# Patient Record
Sex: Male | Born: 1990 | Race: White | Hispanic: Yes | Marital: Married | Smoking: Current every day smoker
Health system: Southern US, Community
[De-identification: ages and names within clinical notes are randomized; demographics above are authoritative.]

## PROBLEM LIST (undated history)

## (undated) DIAGNOSIS — I10 Essential (primary) hypertension: Secondary | ICD-10-CM

## (undated) DIAGNOSIS — F1010 Alcohol abuse, uncomplicated: Secondary | ICD-10-CM

## (undated) DIAGNOSIS — F32A Depression, unspecified: Secondary | ICD-10-CM

## (undated) DIAGNOSIS — I499 Cardiac arrhythmia, unspecified: Secondary | ICD-10-CM

## (undated) DIAGNOSIS — I4891 Unspecified atrial fibrillation: Secondary | ICD-10-CM

## (undated) DIAGNOSIS — F419 Anxiety disorder, unspecified: Secondary | ICD-10-CM

## (undated) HISTORY — DX: Unspecified atrial fibrillation: I48.91

---
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
## 2010-02-26 ENCOUNTER — Emergency Department (HOSPITAL_COMMUNITY): Admission: EM | Admit: 2010-02-26 | Discharge: 2010-02-26 | Payer: Self-pay | Admitting: Emergency Medicine

## 2019-08-04 ENCOUNTER — Emergency Department (HOSPITAL_COMMUNITY)
Admission: EM | Admit: 2019-08-04 | Discharge: 2019-08-04 | Discharge status: Against Medical Advice | Payer: Self-pay | Attending: Emergency Medicine | Admitting: Emergency Medicine

## 2019-08-04 ENCOUNTER — Emergency Department (HOSPITAL_COMMUNITY): Payer: Self-pay

## 2019-08-04 ENCOUNTER — Encounter (HOSPITAL_COMMUNITY): Payer: Self-pay

## 2019-08-04 ENCOUNTER — Other Ambulatory Visit: Payer: Self-pay

## 2019-08-04 DIAGNOSIS — R600 Localized edema: Secondary | ICD-10-CM

## 2019-08-04 DIAGNOSIS — F17200 Nicotine dependence, unspecified, uncomplicated: Secondary | ICD-10-CM | POA: Insufficient documentation

## 2019-08-04 DIAGNOSIS — R945 Abnormal results of liver function studies: Secondary | ICD-10-CM | POA: Insufficient documentation

## 2019-08-04 DIAGNOSIS — R748 Abnormal levels of other serum enzymes: Secondary | ICD-10-CM

## 2019-08-04 DIAGNOSIS — R2243 Localized swelling, mass and lump, lower limb, bilateral: Secondary | ICD-10-CM | POA: Insufficient documentation

## 2019-08-04 LAB — COMPREHENSIVE METABOLIC PANEL
ALT: 191 U/L — ABNORMAL HIGH (ref 0–44)
AST: 275 U/L — ABNORMAL HIGH (ref 15–41)
Albumin: 3.9 g/dL (ref 3.5–5.0)
Alkaline Phosphatase: 113 U/L (ref 38–126)
Anion gap: 14 (ref 5–15)
BUN: <5 mg/dL — ABNORMAL LOW (ref 6–20)
CO2: 22 mmol/L (ref 22–32)
Calcium: 8.6 mg/dL — ABNORMAL LOW (ref 8.9–10.3)
Chloride: 101 mmol/L (ref 98–111)
Creatinine, Ser: 0.6 mg/dL — ABNORMAL LOW (ref 0.61–1.24)
GFR calc Af Amer: >60 mL/min (ref >60)
GFR calc non Af Amer: >60 mL/min (ref >60)
Glucose, Bld: 117 mg/dL — ABNORMAL HIGH (ref 70–99)
Potassium: 3.9 mmol/L (ref 3.5–5.1)
Sodium: 137 mmol/L (ref 135–145)
Total Bilirubin: 1.2 mg/dL (ref 0.3–1.2)
Total Protein: 7.6 g/dL (ref 6.5–8.1)

## 2019-08-04 LAB — CBC WITH DIFFERENTIAL/PLATELET
Abs Immature Granulocytes: 0.01 10*3/uL (ref 0.00–0.07)
Basophils Absolute: 0.1 10*3/uL (ref 0.0–0.1)
Basophils Relative: 2 %
Eosinophils Absolute: 0.1 10*3/uL (ref 0.0–0.5)
Eosinophils Relative: 3 %
HCT: 41.8 % (ref 39.0–52.0)
Hemoglobin: 14.6 g/dL (ref 13.0–17.0)
Immature Granulocytes: 0 %
Lymphocytes Relative: 21 %
Lymphs Abs: 1 10*3/uL (ref 0.7–4.0)
MCH: 34.4 pg — ABNORMAL HIGH (ref 26.0–34.0)
MCHC: 34.9 g/dL (ref 30.0–36.0)
MCV: 98.6 fL (ref 80.0–100.0)
Monocytes Absolute: 0.8 10*3/uL (ref 0.1–1.0)
Monocytes Relative: 18 %
Neutro Abs: 2.6 10*3/uL (ref 1.7–7.7)
Neutrophils Relative %: 56 %
Platelets: 139 10*3/uL — ABNORMAL LOW (ref 150–400)
RBC: 4.24 MIL/uL (ref 4.22–5.81)
RDW: 14.4 % (ref 11.5–15.5)
WBC: 4.7 10*3/uL (ref 4.0–10.5)
nRBC: 0 % (ref 0.0–0.2)

## 2019-08-04 LAB — BRAIN NATRIURETIC PEPTIDE: B Natriuretic Peptide: 162 pg/mL — ABNORMAL HIGH (ref 0.0–100.0)

## 2019-08-04 MED ORDER — SULFAMETHOXAZOLE-TRIMETHOPRIM 800-160 MG PO TABS: 1.0000 | ORAL_TABLET | Freq: Two times a day (BID) | ORAL | 0 refills | Status: AC

## 2019-08-04 NOTE — ED Provider Notes (Signed)
[facility] EMERGENCY DEPARTMENT Provider Note   CSN: [id number] Arrival date & time: 08/04/19  6644     History Chief Complaint  Patient presents with  . Ankle Pain    Omar Mccarthy is a 29 y.o. male with a reported history of hypertension which is currently untreated, also uses heavy EtOH presenting with a 1 week history of worsening bilateral lower extremity swelling along with redness.  He reports his symptoms started in his left ankle area and has progressed to involve both legs including swelling to his knees along with redness which is extended to his bilateral medial thighs.  He denies fevers or chills.  He has had no injuries, also denies any wounds to his skin.  No IV drug use.  He describes his legs feeling tight and stiff, worsened when he has been sitting for a while and improves with ambulation.  He has tried elevation which has not relieved his symptoms.  He denies chest pain or shortness of breath.  He has had no other treatments prior to arrival.  He works at [employer], reporting fairly sedentary position, yet also describes helps cleaning rooms.  The history is provided by the patient.       History reviewed. No pertinent past medical history.  There are no problems to display for this patient.   History reviewed. No pertinent surgical history.     No family history on file.  Social History   Tobacco Use  . Smoking status: Current Every Day Smoker  . Smokeless tobacco: Never Used  Substance Use Topics  . Alcohol use: Not on file    Comment: heavily daily  . Drug use: Never    Home Medications Prior to Admission medications   Medication Sig Start Date End Date Taking? Authorizing Provider  sulfamethoxazole-trimethoprim (BACTRIM DS) 800-160 MG tablet Take 1 tablet by mouth 2 (two) times daily for 10 days. 08/04/19 08/14/19  Burgess Amor, PA-C    Allergies    Patient has no known allergies.  Review of Systems   Review of Systems    Constitutional: Negative for fever.  HENT: Negative for congestion and sore throat.   Eyes: Negative.   Respiratory: Negative for chest tightness and shortness of breath.   Cardiovascular: Positive for leg swelling. Negative for chest pain.  Gastrointestinal: Negative for abdominal pain and nausea.  Genitourinary: Negative.   Musculoskeletal: Positive for arthralgias. Negative for joint swelling and neck pain.  Skin: Positive for color change. Negative for rash and wound.  Neurological: Negative for dizziness, weakness, light-headedness, numbness and headaches.  Psychiatric/Behavioral: Negative.     Physical Exam Updated Vital Signs BP 129/84   Pulse 93   Temp 98.4 F (36.9 C) (Oral)   Resp 15   Ht 5\' 9"  (1.753 m)   Wt 117.9 kg   SpO2 98%   BMI 38.40 kg/m   Physical Exam Vitals and nursing note reviewed.  Constitutional:      Appearance: He is well-developed.  HENT:     Head: Normocephalic and atraumatic.  Eyes:     Conjunctiva/sclera: Conjunctivae normal.  Cardiovascular:     Rate and Rhythm: Normal rate and regular rhythm.     Pulses:          Dorsalis pedis pulses are 2+ on the right side and 2+ on the left side.     Heart sounds: Murmur present. Systolic murmur present with a grade of 3/6.  Pulmonary:     Effort: Pulmonary effort  is normal.     Breath sounds: Normal breath sounds. No wheezing.  Abdominal:     General: Bowel sounds are normal.     Palpations: Abdomen is soft.     Tenderness: There is no abdominal tenderness.  Musculoskeletal:        General: Normal range of motion.     Cervical back: Normal range of motion.     Right lower leg: 2+ Pitting Edema present.     Left lower leg: 2+ Pitting Edema present.  Skin:    General: Skin is warm and dry.     Findings: Erythema present.     Comments: Generalized erythema of bilateral lower legs including ankles and dorsal feet.  There is also erythema of his bilateral medial thighs.  Neurological:      Mental Status: He is alert.     ED Results / Procedures / Treatments   Labs (all labs ordered are listed, but only abnormal results are displayed) Labs Reviewed  CBC WITH DIFFERENTIAL/PLATELET - Abnormal; Notable for the following components:      Result Value   MCH 34.4 (*)    Platelets 139 (*)    All other components within normal limits  BRAIN NATRIURETIC PEPTIDE - Abnormal; Notable for the following components:   B Natriuretic Peptide 162.0 (*)    All other components within normal limits  COMPREHENSIVE METABOLIC PANEL - Abnormal; Notable for the following components:   Glucose, Bld 117 (*)    BUN <5 (*)    Creatinine, Ser 0.60 (*)    Calcium 8.6 (*)    AST 275 (*)    ALT 191 (*)    All other components within normal limits  CULTURE, BLOOD (ROUTINE X 2)    EKG EKG Interpretation  Date/Time:  Wednesday August 04 2019 10:43:08 EST Ventricular Rate:  80 PR Interval:    QRS Duration: 90 QT Interval:  401 QTC Calculation: 463 R Axis:   46 Text Interpretation: Sinus rhythm RSR' in V1 or V2, probably normal variant Borderline repolarization abnormality Borderline ST elevation, anterior leads Baseline wander in lead(s) V6 nonspecific changes new from prior 10/11 Confirmed by Meridee Score [phone]) on 08/04/2019 10:55:55 AM   Radiology DG Chest Portable 1 View  Result Date: 08/04/2019 CLINICAL DATA:  Peripheral edema EXAM: PORTABLE CHEST 1 VIEW COMPARISON:  07/08/2005 FINDINGS: Heart is borderline in size. No confluent airspace opacities or effusions. No acute bony abnormality. IMPRESSION: No active disease. Electronically Signed   By: Charlett Nose M.D.   On: 08/04/2019 10:59    Procedures Procedures (including critical care time)  Medications Ordered in ED Medications - No data to display  ED Course  I have reviewed the triage vital signs and the nursing notes.  Pertinent labs & imaging results that were available during my care of the patient were reviewed by me and  considered in my medical decision making (see chart for details).  Clinical Course as of Aug 03 1753  Wed Aug 04, 2019  [id number] 29 year old male here with bilateral leg swelling pain redness.  York Spaniel it has been going on about a week.  No fever.  No trauma.  They are warm and red although bilateral cellulitis is not common.  Getting screening labs   [MB]    Clinical Course User Index [MB] Terrilee Files, MD   MDM Rules/Calculators/A&P                      Labs reviewed,  pt has elevated LFT's c/w his chronic etoh consumption.  Advised by RN that he wanted to leave so he could smoke and drink a beer.  Discussed results of his work-up today including his blood tests including elevated liver enzymes.  Also discussed other normal labs including a normal WBC count which suggest that this redness and swelling in his bilateral legs less likely infection.  Patient is refusing to stay for admission, he has a significant heart murmur, peripheral edema, but also strong suggestion of acute liver inflammation, denies fevers or chills.  He does require further evaluation of her his elevated transaminases, also is recommended he have an echocardiogram given heart murmur and peripheral edema.  Denies IV drug use.  Discussed potential risks of leaving including worse pain, swelling, development of worsening heart lung and liver conditions.  He was placed on Bactrim to cover for possible cellulitis, although unlikely given this is bilateral and he has no history of trauma to his legs.  He was advised to return here for recheck at any point if his symptoms worsen and/or he changes his mind about admission for further evaluation.  He was given referral information for obtaining a PCP. Final Clinical Impression(s) / ED Diagnoses Final diagnoses:  Bilateral lower extremity edema  Elevated liver enzymes    Rx / DC Orders ED Discharge Orders         Ordered    sulfamethoxazole-trimethoprim (BACTRIM DS) 800-160 MG tablet   2 times daily     08/04/19 1229           Evalee Jefferson, PA-C 08/04/19 1759    Hayden Rasmussen, MD 08/04/19 Lurena Nida

## 2019-08-04 NOTE — ED Notes (Signed)
Pt states he is ready to leave, he needs a beer and cigarette. Advised pt that all of his test results are not back and he may need iv abx.  edp notified

## 2019-08-04 NOTE — ED Triage Notes (Signed)
Pt reports redness, tenderness, and swelling that started in both ankles approx 1 week ago.  Reports now it has spread up his legs to his thighs.

## 2019-08-04 NOTE — Discharge Instructions (Addendum)
As discussed your lab tests reveal that your liver enzymes are elevated which is most likely a result of your alcohol use.  As discussed we really feel you need to be admitted to the hospital for further testing.  You are being prescribed an antibiotic for the redness in your legs, although we suspect that this is less likely to be due to an infection.  Please refer to the resources above for obtaining primary medical care.  In the meantime return here if you have any worsening symptoms, especially worse swelling, development of fever, shortness of breath.  [facility]  [address] [place], [place] [address] [phone]  Services [facility] offers a variety of basic health services.  Services include but are not limited to: Blood pressure checks  Heart rate checks  Blood sugar checks  Urine analysis  Rapid strep tests  Pregnancy tests.  Health education and referrals  People needing more complex services will be directed to a physician online. Using these virtual visits, doctors can evaluate and prescribe medicine and treatments. There will be no medication on-site, though [facility] will help patients fill their prescriptions at little to no cost.   For More information please go to: [email or web address]

## 2019-08-04 NOTE — ED Notes (Signed)
Called pt at number in chart to notify the provider wrote him an abx rx and d/c papers.  No answer and voicemail says it is full and cat leave a message.

## 2019-08-09 LAB — CULTURE, BLOOD (ROUTINE X 2)
Culture: NO GROWTH
Special Requests: ADEQUATE

## 2020-01-11 ENCOUNTER — Encounter (HOSPITAL_COMMUNITY): Payer: Self-pay

## 2020-01-11 ENCOUNTER — Emergency Department (HOSPITAL_COMMUNITY): Payer: Self-pay

## 2020-01-11 ENCOUNTER — Other Ambulatory Visit: Payer: Self-pay

## 2020-01-11 ENCOUNTER — Inpatient Hospital Stay (HOSPITAL_COMMUNITY)
Admission: EM | Admit: 2020-01-11 | Discharge: 2020-01-22 | DRG: 291 | Disposition: A | Discharge status: Home / Self Care | Payer: Self-pay | Attending: Family Medicine | Admitting: Family Medicine

## 2020-01-11 DIAGNOSIS — E8770 Fluid overload, unspecified: Secondary | ICD-10-CM

## 2020-01-11 DIAGNOSIS — J9690 Respiratory failure, unspecified, unspecified whether with hypoxia or hypercapnia: Secondary | ICD-10-CM

## 2020-01-11 DIAGNOSIS — K7010 Alcoholic hepatitis without ascites: Secondary | ICD-10-CM | POA: Diagnosis present

## 2020-01-11 DIAGNOSIS — Z452 Encounter for adjustment and management of vascular access device: Secondary | ICD-10-CM

## 2020-01-11 DIAGNOSIS — I426 Alcoholic cardiomyopathy: Secondary | ICD-10-CM | POA: Diagnosis present

## 2020-01-11 DIAGNOSIS — R0603 Acute respiratory distress: Secondary | ICD-10-CM

## 2020-01-11 DIAGNOSIS — I4819 Other persistent atrial fibrillation: Secondary | ICD-10-CM | POA: Diagnosis present

## 2020-01-11 DIAGNOSIS — J9601 Acute respiratory failure with hypoxia: Secondary | ICD-10-CM | POA: Diagnosis present

## 2020-01-11 DIAGNOSIS — F17200 Nicotine dependence, unspecified, uncomplicated: Secondary | ICD-10-CM | POA: Diagnosis present

## 2020-01-11 DIAGNOSIS — R609 Edema, unspecified: Secondary | ICD-10-CM

## 2020-01-11 DIAGNOSIS — Z8249 Family history of ischemic heart disease and other diseases of the circulatory system: Secondary | ICD-10-CM

## 2020-01-11 DIAGNOSIS — A411 Sepsis due to other specified staphylococcus: Secondary | ICD-10-CM | POA: Diagnosis not present

## 2020-01-11 DIAGNOSIS — I5021 Acute systolic (congestive) heart failure: Secondary | ICD-10-CM | POA: Diagnosis present

## 2020-01-11 DIAGNOSIS — R000 Tachycardia, unspecified: Secondary | ICD-10-CM

## 2020-01-11 DIAGNOSIS — Z781 Physical restraint status: Secondary | ICD-10-CM

## 2020-01-11 DIAGNOSIS — D649 Anemia, unspecified: Secondary | ICD-10-CM | POA: Diagnosis present

## 2020-01-11 DIAGNOSIS — R7989 Other specified abnormal findings of blood chemistry: Secondary | ICD-10-CM

## 2020-01-11 DIAGNOSIS — K852 Alcohol induced acute pancreatitis without necrosis or infection: Secondary | ICD-10-CM | POA: Diagnosis present

## 2020-01-11 DIAGNOSIS — F1020 Alcohol dependence, uncomplicated: Secondary | ICD-10-CM

## 2020-01-11 DIAGNOSIS — R748 Abnormal levels of other serum enzymes: Secondary | ICD-10-CM

## 2020-01-11 DIAGNOSIS — E872 Acidosis: Secondary | ICD-10-CM | POA: Diagnosis present

## 2020-01-11 DIAGNOSIS — K760 Fatty (change of) liver, not elsewhere classified: Secondary | ICD-10-CM | POA: Diagnosis present

## 2020-01-11 DIAGNOSIS — J9600 Acute respiratory failure, unspecified whether with hypoxia or hypercapnia: Secondary | ICD-10-CM

## 2020-01-11 DIAGNOSIS — F10231 Alcohol dependence with withdrawal delirium: Secondary | ICD-10-CM | POA: Diagnosis not present

## 2020-01-11 DIAGNOSIS — L03116 Cellulitis of left lower limb: Secondary | ICD-10-CM | POA: Diagnosis present

## 2020-01-11 DIAGNOSIS — D696 Thrombocytopenia, unspecified: Secondary | ICD-10-CM | POA: Diagnosis present

## 2020-01-11 DIAGNOSIS — I110 Hypertensive heart disease with heart failure: Principal | ICD-10-CM | POA: Diagnosis present

## 2020-01-11 DIAGNOSIS — M7989 Other specified soft tissue disorders: Secondary | ICD-10-CM | POA: Diagnosis present

## 2020-01-11 DIAGNOSIS — Z20822 Contact with and (suspected) exposure to covid-19: Secondary | ICD-10-CM | POA: Diagnosis present

## 2020-01-11 DIAGNOSIS — Y908 Blood alcohol level of 240 mg/100 ml or more: Secondary | ICD-10-CM | POA: Diagnosis present

## 2020-01-11 DIAGNOSIS — Z789 Other specified health status: Secondary | ICD-10-CM

## 2020-01-11 DIAGNOSIS — F10229 Alcohol dependence with intoxication, unspecified: Secondary | ICD-10-CM | POA: Diagnosis present

## 2020-01-11 DIAGNOSIS — F10129 Alcohol abuse with intoxication, unspecified: Secondary | ICD-10-CM | POA: Diagnosis present

## 2020-01-11 HISTORY — DX: Essential (primary) hypertension: I10

## 2020-01-11 LAB — COMPREHENSIVE METABOLIC PANEL
ALT: 102 U/L — ABNORMAL HIGH (ref 0–44)
AST: 203 U/L — ABNORMAL HIGH (ref 15–41)
Albumin: 3.6 g/dL (ref 3.5–5.0)
Alkaline Phosphatase: 138 U/L — ABNORMAL HIGH (ref 38–126)
Anion gap: 15 (ref 5–15)
BUN: 5 mg/dL — ABNORMAL LOW (ref 6–20)
CO2: 18 mmol/L — ABNORMAL LOW (ref 22–32)
Calcium: 8.3 mg/dL — ABNORMAL LOW (ref 8.9–10.3)
Chloride: 106 mmol/L (ref 98–111)
Creatinine, Ser: 0.6 mg/dL — ABNORMAL LOW (ref 0.61–1.24)
GFR calc Af Amer: >60 mL/min (ref >60)
GFR calc non Af Amer: >60 mL/min (ref >60)
Glucose, Bld: 120 mg/dL — ABNORMAL HIGH (ref 70–99)
Potassium: 4.3 mmol/L (ref 3.5–5.1)
Sodium: 139 mmol/L (ref 135–145)
Total Bilirubin: 1.4 mg/dL — ABNORMAL HIGH (ref 0.3–1.2)
Total Protein: 7.1 g/dL (ref 6.5–8.1)

## 2020-01-11 LAB — AMMONIA: Ammonia: 62 umol/L — ABNORMAL HIGH (ref 9–35)

## 2020-01-11 LAB — URINALYSIS, ROUTINE W REFLEX MICROSCOPIC
Bacteria, UA: NONE SEEN
Bilirubin Urine: NEGATIVE
Glucose, UA: NEGATIVE mg/dL
Ketones, ur: NEGATIVE mg/dL
Leukocytes,Ua: NEGATIVE
Nitrite: NEGATIVE
Protein, ur: 30 mg/dL — AB
Specific Gravity, Urine: 1.009 (ref 1.005–1.030)
pH: 5 (ref 5.0–8.0)

## 2020-01-11 LAB — CBC WITH DIFFERENTIAL/PLATELET
Abs Immature Granulocytes: 0.01 10*3/uL (ref 0.00–0.07)
Basophils Absolute: 0.1 10*3/uL (ref 0.0–0.1)
Basophils Relative: 1 %
Eosinophils Absolute: 0 10*3/uL (ref 0.0–0.5)
Eosinophils Relative: 1 %
HCT: 34.7 % — ABNORMAL LOW (ref 39.0–52.0)
Hemoglobin: 11.7 g/dL — ABNORMAL LOW (ref 13.0–17.0)
Immature Granulocytes: 0 %
Lymphocytes Relative: 22 %
Lymphs Abs: 1 10*3/uL (ref 0.7–4.0)
MCH: 35.5 pg — ABNORMAL HIGH (ref 26.0–34.0)
MCHC: 33.7 g/dL (ref 30.0–36.0)
MCV: 105.2 fL — ABNORMAL HIGH (ref 80.0–100.0)
Monocytes Absolute: 0.3 10*3/uL (ref 0.1–1.0)
Monocytes Relative: 7 %
Neutro Abs: 3.1 10*3/uL (ref 1.7–7.7)
Neutrophils Relative %: 69 %
Platelets: 96 10*3/uL — ABNORMAL LOW (ref 150–400)
RBC: 3.3 MIL/uL — ABNORMAL LOW (ref 4.22–5.81)
RDW: 15.8 % — ABNORMAL HIGH (ref 11.5–15.5)
WBC: 4.5 10*3/uL (ref 4.0–10.5)
nRBC: 0 % (ref 0.0–0.2)

## 2020-01-11 LAB — I-STAT CHEM 8, ED
BUN: 3 mg/dL — ABNORMAL LOW (ref 6–20)
Calcium, Ion: 1.03 mmol/L — ABNORMAL LOW (ref 1.15–1.40)
Chloride: 105 mmol/L (ref 98–111)
Creatinine, Ser: 1.2 mg/dL (ref 0.61–1.24)
Glucose, Bld: 113 mg/dL — ABNORMAL HIGH (ref 70–99)
HCT: 35 % — ABNORMAL LOW (ref 39.0–52.0)
Hemoglobin: 11.9 g/dL — ABNORMAL LOW (ref 13.0–17.0)
Potassium: 4.5 mmol/L (ref 3.5–5.1)
Sodium: 140 mmol/L (ref 135–145)
TCO2: 20 mmol/L — ABNORMAL LOW (ref 22–32)

## 2020-01-11 LAB — RAPID URINE DRUG SCREEN, HOSP PERFORMED
Amphetamines: NOT DETECTED
Barbiturates: NOT DETECTED
Benzodiazepines: NOT DETECTED
Cocaine: NOT DETECTED
Opiates: POSITIVE — AB
Tetrahydrocannabinol: NOT DETECTED

## 2020-01-11 LAB — TROPONIN I (HIGH SENSITIVITY)
Troponin I (High Sensitivity): 24 ng/L — ABNORMAL HIGH (ref <18)
Troponin I (High Sensitivity): 23 ng/L — ABNORMAL HIGH (ref <18)

## 2020-01-11 LAB — LIPASE, BLOOD: Lipase: 112 U/L — ABNORMAL HIGH (ref 11–51)

## 2020-01-11 LAB — PROTIME-INR
INR: 1.2 (ref 0.8–1.2)
Prothrombin Time: 14.5 seconds (ref 11.4–15.2)

## 2020-01-11 LAB — BRAIN NATRIURETIC PEPTIDE: B Natriuretic Peptide: 279 pg/mL — ABNORMAL HIGH (ref 0.0–100.0)

## 2020-01-11 LAB — ETHANOL: Alcohol, Ethyl (B): 364 mg/dL (ref <10)

## 2020-01-11 LAB — LACTIC ACID, PLASMA
Lactic Acid, Venous: 2.2 mmol/L (ref 0.5–1.9)
Lactic Acid, Venous: 3.2 mmol/L (ref 0.5–1.9)

## 2020-01-11 LAB — MAGNESIUM: Magnesium: 2 mg/dL (ref 1.7–2.4)

## 2020-01-11 LAB — PHOSPHORUS: Phosphorus: 4.4 mg/dL (ref 2.5–4.6)

## 2020-01-11 LAB — SARS CORONAVIRUS 2 BY RT PCR (HOSPITAL ORDER, PERFORMED IN ~~LOC~~ HOSPITAL LAB): SARS Coronavirus 2: NEGATIVE

## 2020-01-11 LAB — TSH: TSH: 3.89 u[IU]/mL (ref 0.350–4.500)

## 2020-01-11 LAB — ACETAMINOPHEN LEVEL: Acetaminophen (Tylenol), Serum: <10 ug/mL — ABNORMAL LOW (ref 10–30)

## 2020-01-11 MED ORDER — LORAZEPAM 2 MG/ML IJ SOLN
0.0000 mg | Freq: Four times a day (QID) | INTRAMUSCULAR | Status: DC
  Administered 2020-01-12 (×2): 2 mg via INTRAVENOUS
  Administered 2020-01-12: 4 mg via INTRAVENOUS
  Administered 2020-01-12: 2 mg via INTRAVENOUS
  Administered 2020-01-13: 4 mg via INTRAVENOUS
  Administered 2020-01-13 (×2): 2 mg via INTRAVENOUS
  Filled 2020-01-11 (×3): qty 1
  Filled 2020-01-11 (×3): qty 2
  Filled 2020-01-11: qty 1

## 2020-01-11 MED ORDER — METOPROLOL TARTRATE 5 MG/5ML IV SOLN
5.0000 mg | Freq: Once | INTRAVENOUS | Status: AC
  Administered 2020-01-11: 5 mg via INTRAVENOUS
  Filled 2020-01-11: qty 5

## 2020-01-11 MED ORDER — NICOTINE 21 MG/24HR TD PT24
21.0000 mg | MEDICATED_PATCH | Freq: Once | TRANSDERMAL | Status: AC
  Administered 2020-01-11: 21 mg via TRANSDERMAL
  Filled 2020-01-11: qty 1

## 2020-01-11 MED ORDER — THIAMINE HCL 100 MG/ML IJ SOLN
INTRAMUSCULAR | Status: AC
  Filled 2020-01-11: qty 2

## 2020-01-11 MED ORDER — SODIUM CHLORIDE 0.9 % IV BOLUS: 500.0000 mL | Freq: Once | INTRAVENOUS | Status: DC

## 2020-01-11 MED ORDER — LORAZEPAM 2 MG/ML IJ SOLN: 0.0000 mg | Freq: Two times a day (BID) | INTRAMUSCULAR | Status: DC

## 2020-01-11 MED ORDER — FOLIC ACID 5 MG/ML IJ SOLN
INTRAMUSCULAR | Status: AC
  Filled 2020-01-11: qty 0.2

## 2020-01-11 MED ORDER — THIAMINE HCL 100 MG/ML IJ SOLN
100.0000 mg | Freq: Every day | INTRAMUSCULAR | Status: DC
  Administered 2020-01-12 – 2020-01-14 (×3): 100 mg via INTRAVENOUS
  Filled 2020-01-11 (×3): qty 2

## 2020-01-11 MED ORDER — LORAZEPAM 1 MG PO TABS: 1.0000 mg | ORAL_TABLET | ORAL | Status: DC | PRN

## 2020-01-11 MED ORDER — POLYETHYLENE GLYCOL 3350 17 G PO PACK: 17.0000 g | PACK | Freq: Every day | ORAL | Status: DC | PRN

## 2020-01-11 MED ORDER — THIAMINE HCL 100 MG PO TABS: 100.0000 mg | ORAL_TABLET | Freq: Every day | ORAL | Status: DC

## 2020-01-11 MED ORDER — ADULT MULTIVITAMIN W/MINERALS CH
1.0000 | ORAL_TABLET | Freq: Every day | ORAL | Status: DC
  Administered 2020-01-12 – 2020-01-14 (×3): 1 via ORAL
  Filled 2020-01-11 (×4): qty 1

## 2020-01-11 MED ORDER — ONDANSETRON HCL 4 MG/2ML IJ SOLN
4.0000 mg | Freq: Four times a day (QID) | INTRAMUSCULAR | Status: DC | PRN
  Administered 2020-01-12 (×3): 4 mg via INTRAVENOUS
  Filled 2020-01-11 (×3): qty 2

## 2020-01-11 MED ORDER — SODIUM CHLORIDE 0.9 % IV BOLUS
1000.0000 mL | Freq: Once | INTRAVENOUS | Status: AC
  Administered 2020-01-11: 1000 mL via INTRAVENOUS

## 2020-01-11 MED ORDER — FOLIC ACID 1 MG PO TABS
1.0000 mg | ORAL_TABLET | Freq: Every day | ORAL | Status: DC
  Administered 2020-01-12 – 2020-01-13 (×2): 1 mg via ORAL
  Filled 2020-01-11 (×2): qty 1

## 2020-01-11 MED ORDER — THIAMINE HCL 100 MG/ML IJ SOLN
Freq: Once | INTRAVENOUS | Status: AC
  Filled 2020-01-11: qty 1000

## 2020-01-11 MED ORDER — LORAZEPAM 2 MG/ML IJ SOLN
2.0000 mg | Freq: Once | INTRAMUSCULAR | Status: AC
  Administered 2020-01-11: 2 mg via INTRAVENOUS
  Filled 2020-01-11: qty 1

## 2020-01-11 MED ORDER — ONDANSETRON HCL 4 MG PO TABS: 4.0000 mg | ORAL_TABLET | Freq: Four times a day (QID) | ORAL | Status: DC | PRN

## 2020-01-11 MED ORDER — THIAMINE HCL 100 MG/ML IJ SOLN
100.0000 mg | Freq: Once | INTRAMUSCULAR | Status: AC
  Administered 2020-01-11: 100 mg via INTRAVENOUS
  Filled 2020-01-11: qty 2

## 2020-01-11 MED ORDER — LORAZEPAM 2 MG/ML IJ SOLN
1.0000 mg | INTRAMUSCULAR | Status: DC | PRN
  Administered 2020-01-12 (×2): 2 mg via INTRAVENOUS
  Administered 2020-01-12: 4 mg via INTRAVENOUS
  Administered 2020-01-13: 2 mg via INTRAVENOUS
  Administered 2020-01-13 (×2): 4 mg via INTRAVENOUS
  Administered 2020-01-13: 2 mg via INTRAVENOUS
  Filled 2020-01-11: qty 2
  Filled 2020-01-11: qty 1
  Filled 2020-01-11 (×2): qty 2
  Filled 2020-01-11: qty 1

## 2020-01-11 MED ORDER — M.V.I. ADULT IV INJ
INJECTION | INTRAVENOUS | Status: AC
  Filled 2020-01-11: qty 10

## 2020-01-11 NOTE — ED Notes (Signed)
pts O2 fell to 86% on RA, pt placed on 2L per [place], back up to 98%

## 2020-01-11 NOTE — H&P (Addendum)
History and Physical    Omar Mccarthy AVW:[id number] DOB: 1990-12-13 DOA: 01/11/2020  PCP: [facility]   Patient coming from: Home  I have personally briefly reviewed patient's old medical records in [facility] Link  Chief Complaint: Difficulty breathing, leg swelling  HPI: Omar Mccarthy is a 28 y.o. male with medical history significant for hypertension.  Patient presented to the ED with complaints of bilateral lower extremity swelling for the past several months.  He also reports intermittent vomiting, difficulty breathing also intermittent. Patient was in the ED in March, after he left the ED, his swelling improved at that time, but then reoccurred again.  He reports abdominal bloating, he also reports weight gain, his baseline weight is about 240, but his weight was checked today at the health department, it was 273. He reports difficulty breathing, intermittent, stable and unchanged 2-3 pillow orthopnea.  He reports chest pain ongoing over several years, intermittent, not related to activity, described both as sharp, pressure-like and burning, and usually related to his alcohol withdrawal episodes.  No family history of heart disease in parents or siblings. Patient drinks 24-26 beers daily or drinks half a gallon of vodka daily.  His last drink was at about 4 PM, just prior to arrival in the ED, 6 hours ago. Patient reports about 2 weeks ago, he had several episodes of black stools/abnormal colored stools, this lasted for about 2 months.  No vomiting.  Reports occasionally seeing blood when he wipes after a bowel movement.  He has not received the Covid vaccine.  ED Course: Tachycardic up to 150s.  BNP elevated 279.  Blood alcohol level elevated at 364.  Ammonia 62.  Lipase 112.  Troponin X 24.  Hemoglobin 11.7 down from 14.65 months ago..  Left lower extremity venous Dopplers negative for DVT.  Portable chest x-ray without acute abnormality. 1 L bolus given in  ED with improvement in patient's heart rate.  Hospitalist to admit for alcohol intoxication and volume overload.  Review of Systems: As per HPI all other systems reviewed and negative.  Past Medical History:  Diagnosis Date  . Hypertension     History reviewed. No pertinent surgical history.   reports that he has been smoking. He has never used smokeless tobacco. He reports that he does not use drugs. No history on file for alcohol use.  No Known Allergies  Family history of hypertension in father and mother.  Prior to Admission medications   Not on File    Physical Exam: Vitals:   01/11/20 2030 01/11/20 2044 01/11/20 2049 01/11/20 2102  BP: 93/72  (!) 120/91   Pulse: 89 (!) 157  (!) 112  Resp: 19 20 (!) 21 15  Temp:      TempSrc:      SpO2: 95% 97%  100%  Weight:      Height:        Constitutional: NAD, calm, comfortable Vitals:   01/11/20 2030 01/11/20 2044 01/11/20 2049 01/11/20 2102  BP: 93/72  (!) 120/91   Pulse: 89 (!) 157  (!) 112  Resp: 19 20 (!) 21 15  Temp:      TempSrc:      SpO2: 95% 97%  100%  Weight:      Height:       Eyes: PERRL, lids and conjunctivae normal ENMT: Mucous membranes are moist. Posterior pharynx clear of any exudate or lesions.Normal dentition.  Neck: normal, supple, no masses, no thyromegaly Respiratory: clear to  auscultation bilaterally, no wheezing, no crackles. Normal respiratory effort. No accessory muscle use.  Cardiovascular: Tachycardiac, unable to appreciate murmurs, 1+ extremity edema to knees, minimal differential erythema to left lower extremity without tenderness or differential warmth  2+ pedal pulses. Abdomen: no tenderness, no masses palpated. No hepatosplenomegaly. Bowel sounds positive.  Musculoskeletal: no clubbing / cyanosis. No joint deformity upper and lower extremities. Good ROM, no contractures. Normal muscle tone.  Skin: no rashes, lesions, ulcers. No induration Neurologic: No apparent cranial nerve  abnormality, moving extremities spontaneously. Psychiatric: Normal judgment and insight. Alert and oriented x 3. Normal mood.   Labs on Admission: I have personally reviewed following labs and imaging studies  CBC: Recent Labs  Lab 01/11/20 1914 01/11/20 1956  WBC 4.5  --   NEUTROABS 3.1  --   HGB 11.7* 11.9*  HCT 34.7* 35.0*  MCV 105.2*  --   PLT 96*  --    Basic Metabolic Panel: Recent Labs  Lab 01/11/20 1914 01/11/20 1956  NA 139 140  K 4.3 4.5  CL 106 105  CO2 18*  --   GLUCOSE 120* 113*  BUN 5* 3*  CREATININE 0.60* 1.20  CALCIUM 8.3*  --   MG 2.0  --    Liver Function Tests: Recent Labs  Lab 01/11/20 1914  AST 203*  ALT 102*  ALKPHOS 138*  BILITOT 1.4*  PROT 7.1  ALBUMIN 3.6   Recent Labs  Lab 01/11/20 1914  LIPASE 112*   Recent Labs  Lab 01/11/20 1914  AMMONIA 62*   Coagulation Profile: Recent Labs  Lab 01/11/20 1914  INR 1.2    Radiological Exams on Admission: US Venous Img Lower Unilateral Left  Result Date: 01/11/2020 CLINICAL DATA:  Lower extremity pain and edema EXAM: LEFT LOWER EXTREMITY VENOUS DUPLEX ULTRASOUND TECHNIQUE: Gray-scale sonography with graded compression, as well as color Doppler and duplex ultrasound were performed to evaluate the left lower extremity deep venous system from the level of the common femoral vein and including the common femoral, femoral, profunda femoral, popliteal and calf veins including the posterior tibial, peroneal and gastrocnemius veins when visible. The superficial great saphenous vein was also interrogated. Spectral Doppler was utilized to evaluate flow at rest and with distal augmentation maneuvers in the common femoral, femoral and popliteal veins. COMPARISON:  None. FINDINGS: Contralateral Common Femoral Vein: Respiratory phasicity is normal and symmetric with the symptomatic side. No evidence of thrombus. Normal compressibility. Common Femoral Vein: No evidence of thrombus. Normal compressibility,  respiratory phasicity and response to augmentation. Saphenofemoral Junction: No evidence of thrombus. Normal compressibility and flow on color Doppler imaging. Profunda Femoral Vein: No evidence of thrombus. Normal compressibility and flow on color Doppler imaging. Femoral Vein: No evidence of thrombus. Normal compressibility, respiratory phasicity and response to augmentation. Popliteal Vein: No evidence of thrombus. Normal compressibility, respiratory phasicity and response to augmentation. Calf Veins: No evidence of thrombus. Normal compressibility and flow on color Doppler imaging. Superficial Great Saphenous Vein: No evidence of thrombus. Normal compressibility. Venous Reflux:  None. Other Findings:  There is calf region edema. IMPRESSION: No evidence of deep venous thrombosis in the left lower extremity. Right common femoral vein patent. There is left lower extremity calf region edema. Electronically Signed   By: Bretta Bang III M.D.   On: 01/11/2020 15:19   DG Chest Port 1 View  Result Date: 01/11/2020 CLINICAL DATA:  29 year old male with history of tachycardia and leg swelling. EXAM: PORTABLE CHEST 1 VIEW COMPARISON:  Chest x-ray 08/04/2019.  FINDINGS: Lung volumes are normal. No consolidative airspace disease. No pleural effusions. No pneumothorax. No pulmonary nodule or mass noted. Pulmonary vasculature and the cardiomediastinal silhouette are within normal limits. IMPRESSION: No radiographic evidence of acute cardiopulmonary disease. Electronically Signed   By: Trudie Reed M.D.   On: 01/11/2020 19:58    EKG: Independently reviewed.  Sinus tachycardia rate 149.  QTc 481.  Assessment/Plan Principal Problem:   Fluid overload Active Problems:   Alcohol abuse with intoxication (HCC)   Leg swelling  Fluid overload-1+ lower extremity swelling, with ~ 30Lb weight gain.  Troponin 24 > 23.  albumin level 3.6.  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  Left lower extremity Dopplers negative for DVT. -Obtain echocardiogram -Obtain UA, check urine protein -UDS -TSH -At this time patient is intoxicated with alcohol requiring IV fluids, will hold off on IV Lasix for now -Strict input output, daily weights  Alcohol abuse with intoxication-blood alcohol level 364.  Reports history of alcohol withdrawal, no seizures.  Currently he is tachycardic in the 150s, otherwise without signs of alcohol withdrawal.  Last drink about 6 hours ago, drinks 24-26 beers daily or half gallon of vodka.  Potassium 4.3, magnesium 2.  Serum bicarb of 18, anion gap of 15. -1 L bolus given, give 1 L banana bag, will need diuresis, as he is volume overloaded -Blood alcohol level in the morning  - IV metoprolol 5 mg x 1 -CIWA protocol as needed and scheduled -Thiamine folate multivitamin -Check phosphorus -High risk for alcohol withdrawal  Elevated liver enzymes-ALP 138, AST 203, ALT 102.  Total bilirubin 1.4.  Lipase 112.  Ammonia 62.  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  But ALP, T. bili and lipase also elevated, suggesting obstruction. -Obtain liver ultrasound -CMP in the morning -Hepatitis panel  Lactic acidosis- 2.2 > 3.2.  After 1 L bolus.  Likely due to alcohol intoxication, and possibly liver disease.  At this time doubt infectious etiology. -Add 1 L banana bag. -Trend lactic acid.  Thrombocytopenia-96.  Likely due to alcohol abuse. -CBC in the morning  Acute anemia-hemoglobin 11.7, down from 14.65 months ago.  He reports multiple episodes of discolored/dark stools, none in the past 2 weeks. -Iron studies in the morning -Vitamin B12, folate, reticulocyte count -CBC in the morning -IV Protonix 40 daily for now  DVT prophylaxis: SCDs Code Status: Full code Family Communication: None at bedside. Disposition Plan: > 2 days, pending cardiac evaluation, diuresis. Consults called: None Admission status: Inpatient, telemetry I certify that at the  point of admission it is my clinical judgment that the patient will require inpatient hospital care spanning beyond 2 midnights from the point of admission due to high intensity of service, high risk for further deterioration and high frequency of surveillance required. The following factors support the patient status of inpatient: Volume overload, requiring IV fluids after alcohol intoxication has resolved.   Onnie Boer MD [facility]  01/11/2020, 10:44 PM

## 2020-01-11 NOTE — ED Notes (Signed)
Date and time results received: 01/11/20 2020  Test: lactic Critical Value: 2.2  Test: ETOH Critical Value: 364  Name of Provider Notified: Lanora Manis PA  Orders Received? Or Actions Taken?: acknowledged

## 2020-01-11 NOTE — ED Provider Notes (Signed)
[facility] EMERGENCY DEPARTMENT Provider Note   CSN: [id number] Arrival date & time: 01/11/20  1245     History Chief Complaint  Patient presents with  . Leg Pain    Omar Mccarthy is a 29 y.o. male with a past medical history of hypertension, alcohol abuse, who presents today for evaluation of leg swelling. He reports that he was [facility] the [facility] earlier today and they noted that his left leg was significantly more swollen than his right leg and sent him here. He does not have a history of DVT/PE. He reports that he drinks about half a gallon of vodka or about 24-26 beers a day. He states his last drink was about 6 hours ago. He notes that over the past 3 months he has been feeling short of breath and generally poor. His leg swelling started over the past few weeks, he is unable to be more specific. He reports that he has been having worsening vomiting over the past few months. He denies any fevers. Reports that he is short of breath at baseline.  Chart review shows that he was seen and had admission recommended on 3/21 however he refused and left AMA. At that point he had cardiac murmur, pitting edema, BNP elevated at 162 and transaminitis with AST 275 and ALT 191. He left AMA at that time and states that he did not follow-up with any of the recommended providers.  No recent trauma, however he notes easy ecchymosis.   Patient is a poor historian. Level 5 caveat for condition.   HPI     Past Medical History:  Diagnosis Date  . Hypertension     There are no problems to display for this patient.   History reviewed. No pertinent surgical history.     No family history on file.  Social History   Tobacco Use  . Smoking status: Current Every Day Smoker  . Smokeless tobacco: Never Used  Substance Use Topics  . Alcohol use: Not on file    Comment: heavily daily  . Drug use: Never    Home Medications Prior to Admission medications   Not on File     Allergies    Patient has no known allergies.  Review of Systems   Review of Systems  Unable to perform ROS: Mental status change  Slow to respond, keeps eyes closed, slow to follow commands.    Physical Exam Updated Vital Signs BP 93/72   Pulse 89   Temp 97.9 F (36.6 C) (Oral)   Resp 19   Ht 5' 7"  (1.702 m)   Wt 123 kg   SpO2 95%   BMI 42.47 kg/m   Physical Exam Vitals and nursing note reviewed.  Constitutional:      Appearance: He is well-developed. He is obese. He is ill-appearing.     Comments: Keeps eyes closed unless directed to open them.  Slow to follow commands, lethargic.   HENT:     Head: Normocephalic and atraumatic.  Eyes:     Conjunctiva/sclera: Conjunctivae normal.  Cardiovascular:     Rate and Rhythm: Tachycardia present. Rhythm irregular.     Heart sounds: Murmur heard.   Pulmonary:     Effort: Pulmonary effort is normal. No respiratory distress.     Breath sounds: Normal breath sounds.  Abdominal:     General: There is no distension.     Palpations: Abdomen is soft.     Tenderness: There is no abdominal tenderness. There is no  guarding.  Musculoskeletal:     Cervical back: Normal range of motion and neck supple. No rigidity.     Comments: There is significant edema of the bilateral lower legs left greater than right.    Pitting edema.    Skin:    General: Skin is warm and dry.     Comments: Multiple areas of ecchymosis  Bilateral lower extremities are erythematous.  Neurological:     Comments: Patient keeps his eyes closed, he is awake however slow to respond and appears lethargic. He does not have slurred speech. He is able to move his bilateral arms and legs and sit up when asked to.    Psychiatric:     Comments: Reports he is unable to stay in the hospital, does appear anxious when this is mentioned, however is cooperative.     ED Results / Procedures / Treatments   Labs (all labs ordered are listed, but only abnormal results are  displayed) Labs Reviewed  CBC WITH DIFFERENTIAL/PLATELET - Abnormal; Notable for the following components:      Result Value   RBC 3.30 (*)    Hemoglobin 11.7 (*)    HCT 34.7 (*)    MCV 105.2 (*)    MCH 35.5 (*)    RDW 15.8 (*)    Platelets 96 (*)    All other components within normal limits  COMPREHENSIVE METABOLIC PANEL - Abnormal; Notable for the following components:   CO2 18 (*)    Glucose, Bld 120 (*)    BUN 5 (*)    Creatinine, Ser 0.60 (*)    Calcium 8.3 (*)    AST 203 (*)    ALT 102 (*)    Alkaline Phosphatase 138 (*)    Total Bilirubin 1.4 (*)    All other components within normal limits  BRAIN NATRIURETIC PEPTIDE - Abnormal; Notable for the following components:   B Natriuretic Peptide 279.0 (*)    All other components within normal limits  ETHANOL - Abnormal; Notable for the following components:   Alcohol, Ethyl (B) 364 (*)    All other components within normal limits  ACETAMINOPHEN LEVEL - Abnormal; Notable for the following components:   Acetaminophen (Tylenol), Serum <10 (*)    All other components within normal limits  LACTIC ACID, PLASMA - Abnormal; Notable for the following components:   Lactic Acid, Venous 2.2 (*)    All other components within normal limits  AMMONIA - Abnormal; Notable for the following components:   Ammonia 62 (*)    All other components within normal limits  LIPASE, BLOOD - Abnormal; Notable for the following components:   Lipase 112 (*)    All other components within normal limits  I-STAT CHEM 8, ED - Abnormal; Notable for the following components:   BUN 3 (*)    Glucose, Bld 113 (*)    Calcium, Ion 1.03 (*)    TCO2 20 (*)    Hemoglobin 11.9 (*)    HCT 35.0 (*)    All other components within normal limits  TROPONIN I (HIGH SENSITIVITY) - Abnormal; Notable for the following components:   Troponin I (High Sensitivity) 24 (*)    All other components within normal limits  CULTURE, BLOOD (ROUTINE X 2)  CULTURE, BLOOD (ROUTINE X  2)  SARS CORONAVIRUS 2 BY RT PCR (HOSPITAL ORDER, [place] [facility])  PROTIME-INR  MAGNESIUM  LACTIC ACID, PLASMA  TROPONIN I (HIGH SENSITIVITY)    EKG None  Radiology  US Venous Img Lower Unilateral Left  Result Date: 01/11/2020 CLINICAL DATA:  Lower extremity pain and edema EXAM: LEFT LOWER EXTREMITY VENOUS DUPLEX ULTRASOUND TECHNIQUE: Gray-scale sonography with graded compression, as well as color Doppler and duplex ultrasound were performed to evaluate the left lower extremity deep venous system from the level of the common femoral vein and including the common femoral, femoral, profunda femoral, popliteal and calf veins including the posterior tibial, peroneal and gastrocnemius veins when visible. The superficial great saphenous vein was also interrogated. Spectral Doppler was utilized to evaluate flow at rest and with distal augmentation maneuvers in the common femoral, femoral and popliteal veins. COMPARISON:  None. FINDINGS: Contralateral Common Femoral Vein: Respiratory phasicity is normal and symmetric with the symptomatic side. No evidence of thrombus. Normal compressibility. Common Femoral Vein: No evidence of thrombus. Normal compressibility, respiratory phasicity and response to augmentation. Saphenofemoral Junction: No evidence of thrombus. Normal compressibility and flow on color Doppler imaging. Profunda Femoral Vein: No evidence of thrombus. Normal compressibility and flow on color Doppler imaging. Femoral Vein: No evidence of thrombus. Normal compressibility, respiratory phasicity and response to augmentation. Popliteal Vein: No evidence of thrombus. Normal compressibility, respiratory phasicity and response to augmentation. Calf Veins: No evidence of thrombus. Normal compressibility and flow on color Doppler imaging. Superficial Great Saphenous Vein: No evidence of thrombus. Normal compressibility. Venous Reflux:  None. Other Findings:  There is calf region edema.  IMPRESSION: No evidence of deep venous thrombosis in the left lower extremity. Right common femoral vein patent. There is left lower extremity calf region edema. Electronically Signed   By: Lowella Grip III M.D.   On: 01/11/2020 15:19   DG Chest Port 1 View  Result Date: 01/11/2020 CLINICAL DATA:  29 year old male with history of tachycardia and leg swelling. EXAM: PORTABLE CHEST 1 VIEW COMPARISON:  Chest x-ray 08/04/2019. FINDINGS: Lung volumes are normal. No consolidative airspace disease. No pleural effusions. No pneumothorax. No pulmonary nodule or mass noted. Pulmonary vasculature and the cardiomediastinal silhouette are within normal limits. IMPRESSION: No radiographic evidence of acute cardiopulmonary disease. Electronically Signed   By: Vinnie Langton M.D.   On: 01/11/2020 19:58    Procedures .Critical Care Performed by: Lorin Glass, PA-C Authorized by: Lorin Glass, PA-C   Critical care provider statement:    Critical care time (minutes):  45   Critical care time was exclusive of:  Separately billable procedures and treating other patients and teaching time   Critical care was necessary to treat or prevent imminent or life-threatening deterioration of the following conditions:  Cardiac failure   Critical care was time spent personally by me on the following activities:  Discussions with consultants, evaluation of patient's response to treatment, examination of patient, ordering and performing treatments and interventions, ordering and review of laboratory studies, ordering and review of radiographic studies, pulse oximetry, re-evaluation of patient's condition, obtaining history from patient or surrogate and review of old charts Comments:     Ventricular rate over 150 with IV fluids   (including critical care time)  Medications Ordered in ED Medications  nicotine (NICODERM CQ - dosed in mg/24 hours) patch 21 mg (21 mg Transdermal Patch Applied 01/11/20 2004)    sodium chloride 0.9 % bolus 1,000 mL (1,000 mLs Intravenous New Bag/Given 01/11/20 2003)  thiamine (B-1) injection 100 mg (100 mg Intravenous Given 01/11/20 2004)  LORazepam (ATIVAN) injection 2 mg (2 mg Intravenous Given 01/11/20 2050)    ED Course  I have reviewed the triage vital signs  and the nursing notes.  Pertinent labs & imaging results that were available during my care of the patient were reviewed by me and considered in my medical decision making (see chart for details).  Clinical Course as of Jan 10 2053  Tue Jan 11, 2020  2034 Ammonia(!): 62 [EH]  2034 Alcohol, Ethyl (B)(!!): 364 [EH]    Clinical Course User Index [EH] Ollen Gross   MDM Rules/Calculators/A&P                         Patient is a 29 year old man who presents today for evaluation of leg swelling. On my initial exam he appears to the lethargic and slow to respond. He is also noted to be markedly tachycardic while I am in the room with a heart rate in the 160s to 170s.  CBC shows hemoglobin is slightly low 11.7.  CMP shows normal creatinine.  CO2 is slightly low at 18.  Mild transaminitis AST 203, ALT 102.  Alk phos and total bili are elevated at 138 and 1.4 respectively.  T INR is normal.  BNP is elevated at 279.  Additionally lipase is high at 112.  Chest x-ray without consolidation or other acute abnormality.  He was given IV fluids, nicotine patch, and thiamine while in the emergency room.  He was mostly tachycardic from 130s to 160s after some IV fluids.  His ethanol is significantly elevated at 364 with ammonia elevated at 62, no prior ammonia for comparison.    Patient has a heart murmur and appears overall fluid overloaded.  Did have DVT study performed 1 waiting for room which was normal.    He does not have a GI doctor. Patient will require admission for echo, further evaluation.  He is given Ativan to try and help with anxiety.  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  Definitely he is at high risk of withdrawal.    This patient was seen as a shared visit with Dr. Laverta Baltimore.   I spoke with Hospitalist who will see patient for admission.   Note: Portions of this report may have been transcribed using voice recognition software. Every effort was made to ensure accuracy; however, inadvertent computerized transcription errors may be present   Final Clinical Impression(s) / ED Diagnoses Final diagnoses:  Tachycardia  Alcohol use disorder, severe, dependence ([place])  Peripheral edema  Hypervolemia, unspecified hypervolemia type    Rx / DC Orders ED Discharge Orders    None       Ollen Gross 01/11/20 2111    Margette Fast, MD 01/17/20 [phone]

## 2020-01-11 NOTE — ED Notes (Signed)
Patient was not in waiting room

## 2020-01-11 NOTE — ED Triage Notes (Signed)
Pt reports redness and swelling to both legs since Feb.  Reports got better for a while then got bad again. Both left red and swollen, left worse than r.  PA assessing pt.

## 2020-01-12 ENCOUNTER — Inpatient Hospital Stay (HOSPITAL_COMMUNITY): Payer: Self-pay

## 2020-01-12 ENCOUNTER — Other Ambulatory Visit (HOSPITAL_COMMUNITY): Payer: Self-pay

## 2020-01-12 ENCOUNTER — Other Ambulatory Visit: Payer: Self-pay

## 2020-01-12 DIAGNOSIS — R748 Abnormal levels of other serum enzymes: Secondary | ICD-10-CM

## 2020-01-12 DIAGNOSIS — D696 Thrombocytopenia, unspecified: Secondary | ICD-10-CM

## 2020-01-12 DIAGNOSIS — I4891 Unspecified atrial fibrillation: Secondary | ICD-10-CM

## 2020-01-12 LAB — BLOOD CULTURE ID PANEL (REFLEXED) - BCID2

## 2020-01-12 LAB — LIPASE, BLOOD: Lipase: 117 U/L — ABNORMAL HIGH (ref 11–51)

## 2020-01-12 LAB — IRON AND TIBC
Iron: 83 ug/dL (ref 45–182)
Saturation Ratios: 26 % (ref 17.9–39.5)
TIBC: 317 ug/dL (ref 250–450)
UIBC: 234 ug/dL

## 2020-01-12 LAB — MRSA PCR SCREENING: MRSA by PCR: NEGATIVE

## 2020-01-12 LAB — COMPREHENSIVE METABOLIC PANEL
ALT: 94 U/L — ABNORMAL HIGH (ref 0–44)
AST: 209 U/L — ABNORMAL HIGH (ref 15–41)
Albumin: 3.3 g/dL — ABNORMAL LOW (ref 3.5–5.0)
Alkaline Phosphatase: 157 U/L — ABNORMAL HIGH (ref 38–126)
Anion gap: 11 (ref 5–15)
BUN: 6 mg/dL (ref 6–20)
CO2: 19 mmol/L — ABNORMAL LOW (ref 22–32)
Calcium: 8.1 mg/dL — ABNORMAL LOW (ref 8.9–10.3)
Chloride: 108 mmol/L (ref 98–111)
Creatinine, Ser: 0.61 mg/dL (ref 0.61–1.24)
GFR calc Af Amer: >60 mL/min (ref >60)
GFR calc non Af Amer: >60 mL/min (ref >60)
Glucose, Bld: 119 mg/dL — ABNORMAL HIGH (ref 70–99)
Potassium: 4.2 mmol/L (ref 3.5–5.1)
Sodium: 138 mmol/L (ref 135–145)
Total Bilirubin: 1.2 mg/dL (ref 0.3–1.2)
Total Protein: 6.6 g/dL (ref 6.5–8.1)

## 2020-01-12 LAB — VITAMIN B12: Vitamin B-12: 478 pg/mL (ref 180–914)

## 2020-01-12 LAB — RETICULOCYTES
Immature Retic Fract: 14.1 % (ref 2.3–15.9)
RBC.: 3.07 MIL/uL — ABNORMAL LOW (ref 4.22–5.81)
Retic Count, Absolute: 92.1 10*3/uL (ref 19.0–186.0)
Retic Ct Pct: 3 % (ref 0.4–3.1)

## 2020-01-12 LAB — CBC
HCT: 32.8 % — ABNORMAL LOW (ref 39.0–52.0)
Hemoglobin: 10.8 g/dL — ABNORMAL LOW (ref 13.0–17.0)
MCH: 34.8 pg — ABNORMAL HIGH (ref 26.0–34.0)
MCHC: 32.9 g/dL (ref 30.0–36.0)
MCV: 105.8 fL — ABNORMAL HIGH (ref 80.0–100.0)
Platelets: 76 10*3/uL — ABNORMAL LOW (ref 150–400)
RBC: 3.1 MIL/uL — ABNORMAL LOW (ref 4.22–5.81)
RDW: 15.7 % — ABNORMAL HIGH (ref 11.5–15.5)
WBC: 4.1 10*3/uL (ref 4.0–10.5)
nRBC: 0 % (ref 0.0–0.2)

## 2020-01-12 LAB — HEPATITIS PANEL, ACUTE
HCV Ab: NONREACTIVE
Hep A IgM: NONREACTIVE
Hep B C IgM: NONREACTIVE
Hepatitis B Surface Ag: NONREACTIVE

## 2020-01-12 LAB — HIV ANTIBODY (ROUTINE TESTING W REFLEX): HIV Screen 4th Generation wRfx: NONREACTIVE

## 2020-01-12 LAB — HEPARIN LEVEL (UNFRACTIONATED): Heparin Unfractionated: 0.3 IU/mL (ref 0.30–0.70)

## 2020-01-12 LAB — FERRITIN: Ferritin: 807 ng/mL — ABNORMAL HIGH (ref 24–336)

## 2020-01-12 LAB — ETHANOL: Alcohol, Ethyl (B): 230 mg/dL — ABNORMAL HIGH (ref <10)

## 2020-01-12 LAB — FOLATE: Folate: 9.2 ng/mL (ref >5.9)

## 2020-01-12 LAB — LACTIC ACID, PLASMA: Lactic Acid, Venous: 2 mmol/L (ref 0.5–1.9)

## 2020-01-12 MED ORDER — DEXMEDETOMIDINE HCL IN NACL 400 MCG/100ML IV SOLN
0.4000 ug/kg/h | INTRAVENOUS | Status: DC
  Administered 2020-01-12: 0.4 ug/kg/h via INTRAVENOUS
  Administered 2020-01-13 (×2): 1.5 ug/kg/h via INTRAVENOUS
  Administered 2020-01-13 (×2): 1.2 ug/kg/h via INTRAVENOUS
  Administered 2020-01-13 (×2): 1.5 ug/kg/h via INTRAVENOUS
  Administered 2020-01-13: 1.2 ug/kg/h via INTRAVENOUS
  Administered 2020-01-13 (×2): 1.5 ug/kg/h via INTRAVENOUS
  Filled 2020-01-12 (×11): qty 100

## 2020-01-12 MED ORDER — HEPARIN BOLUS VIA INFUSION
4000.0000 [IU] | Freq: Once | INTRAVENOUS | Status: AC
  Administered 2020-01-12: 4000 [IU] via INTRAVENOUS
  Filled 2020-01-12: qty 4000

## 2020-01-12 MED ORDER — IOHEXOL 300 MG/ML  SOLN: 100.0000 mL | Freq: Once | INTRAMUSCULAR | Status: DC | PRN

## 2020-01-12 MED ORDER — GUAIFENESIN-DM 100-10 MG/5ML PO SYRP
5.0000 mL | ORAL_SOLUTION | ORAL | Status: DC | PRN
  Administered 2020-01-12: 5 mL via ORAL
  Filled 2020-01-12: qty 5

## 2020-01-12 MED ORDER — DILTIAZEM HCL-DEXTROSE 125-5 MG/125ML-% IV SOLN (PREMIX)
5.0000 mg/h | INTRAVENOUS | Status: DC
  Administered 2020-01-12: 5 mg/h via INTRAVENOUS
  Administered 2020-01-12: 15 mg/h via INTRAVENOUS
  Administered 2020-01-13: 5 mg/h via INTRAVENOUS
  Administered 2020-01-13: 15 mg/h via INTRAVENOUS
  Filled 2020-01-12 (×4): qty 125

## 2020-01-12 MED ORDER — CHLORHEXIDINE GLUCONATE CLOTH 2 % EX PADS
6.0000 | MEDICATED_PAD | Freq: Every day | CUTANEOUS | Status: DC
  Administered 2020-01-12 – 2020-01-22 (×11): 6 via TOPICAL

## 2020-01-12 MED ORDER — DILTIAZEM HCL 25 MG/5ML IV SOLN
10.0000 mg | Freq: Once | INTRAVENOUS | Status: AC
  Administered 2020-01-12: 10 mg via INTRAVENOUS

## 2020-01-12 MED ORDER — TRAMADOL HCL 50 MG PO TABS
50.0000 mg | ORAL_TABLET | Freq: Four times a day (QID) | ORAL | Status: DC | PRN
  Administered 2020-01-12 (×2): 50 mg via ORAL
  Filled 2020-01-12 (×2): qty 1

## 2020-01-12 MED ORDER — DILTIAZEM HCL 25 MG/5ML IV SOLN
10.0000 mg | Freq: Once | INTRAVENOUS | Status: AC
  Administered 2020-01-12: 10 mg via INTRAVENOUS
  Filled 2020-01-12: qty 5

## 2020-01-12 MED ORDER — ORAL CARE MOUTH RINSE
15.0000 mL | Freq: Two times a day (BID) | OROMUCOSAL | Status: DC
  Administered 2020-01-12 – 2020-01-14 (×3): 15 mL via OROMUCOSAL

## 2020-01-12 MED ORDER — CEFAZOLIN SODIUM-DEXTROSE 2-4 GM/100ML-% IV SOLN
2.0000 g | Freq: Three times a day (TID) | INTRAVENOUS | Status: DC
  Administered 2020-01-12 – 2020-01-22 (×30): 2 g via INTRAVENOUS
  Filled 2020-01-12 (×30): qty 100

## 2020-01-12 MED ORDER — NICOTINE 21 MG/24HR TD PT24
21.0000 mg | MEDICATED_PATCH | Freq: Every day | TRANSDERMAL | Status: DC
  Administered 2020-01-12 – 2020-01-22 (×11): 21 mg via TRANSDERMAL
  Filled 2020-01-12 (×11): qty 1

## 2020-01-12 MED ORDER — HEPARIN (PORCINE) 25000 UT/250ML-% IV SOLN
1400.0000 [IU]/h | INTRAVENOUS | Status: DC
  Administered 2020-01-12: 1400 [IU]/h via INTRAVENOUS
  Filled 2020-01-12: qty 250

## 2020-01-12 MED ORDER — IOHEXOL 350 MG/ML SOLN
100.0000 mL | Freq: Once | INTRAVENOUS | Status: AC | PRN
  Administered 2020-01-12: 100 mL via INTRAVENOUS

## 2020-01-12 MED ORDER — METOPROLOL TARTRATE 5 MG/5ML IV SOLN
5.0000 mg | Freq: Four times a day (QID) | INTRAVENOUS | Status: DC
  Administered 2020-01-12 – 2020-01-14 (×9): 5 mg via INTRAVENOUS
  Filled 2020-01-12 (×8): qty 5

## 2020-01-12 NOTE — TOC Initial Note (Signed)
Transition of Care [facility]) - Initial/Assessment Note    Patient Details  Name: Omar Mccarthy MRN: [id number] Date of Birth: 05-Jan-1991  Transition of Care [facility]) CM/SW Contact:    Karn Cassis, LCSW Phone Number: 01/12/2020, 11:17 AM  Clinical Narrative:  Pt admitted due to fluid overload. His blood alcohol level was 364 on admission. TOC consulted for substance abuse. He lives with his parents and reports that he is currently not working. Pt states [facility] and was sent to ED. He indicates he has been drinking for 8-9 years and usually drinks a quarter gallon of vodka a day or about 24 beers a day. Pt states he went to detox for 4 days in [place] in December and was sober for 2 months. Pt admits that his drinking is a problem for him and wants to cut back. His mother particularly has been encouraging him to stop. He is not interested in inpatient treatment, but would like to consider outpatient. LCSW left outpatient substance abuse resource information in pt's room. [facility] assessment completed. No other needs reported at this time.                   Expected Discharge Plan: Home/Self Care Barriers to Discharge: Continued Medical Work up   Patient Goals and CMS Choice Patient states their goals for this hospitalization and ongoing recovery are:: return home      Expected Discharge Plan and Services Expected Discharge Plan: Home/Self Care In-house Referral: Clinical Social Work Discharge Planning Services: Other - See comment (outpatient substance abuse resources)   Living arrangements for the past 2 months: Single Family Home                                      Prior Living Arrangements/Services Living arrangements for the past 2 months: Single Family Home Lives with:: Parents   Do you feel safe going back to the place where you live?: Yes      Need for Family Participation in Patient Care: No (Comment)     Criminal  Activity/Legal Involvement Pertinent to Current Situation/Hospitalization: No - Comment as needed  Activities of Daily Living Home Assistive Devices/Equipment: None ADL Screening (condition at time of admission) Patient's cognitive ability adequate to safely complete daily activities?: Yes Is the patient deaf or have difficulty hearing?: No Does the patient have difficulty seeing, even when wearing glasses/contacts?: No Does the patient have difficulty concentrating, remembering, or making decisions?: No Patient able to express need for assistance with ADLs?: Yes Does the patient have difficulty dressing or bathing?: No Independently performs ADLs?: Yes (appropriate for developmental age) Does the patient have difficulty walking or climbing stairs?: No Weakness of Legs: Both Weakness of Arms/Hands: None  Permission Sought/Granted                  Emotional Assessment Appearance:: Appears stated age   Affect (typically observed): Appropriate Orientation: : Oriented to Self, Oriented to Place, Oriented to  Time, Oriented to Situation Alcohol / Substance Use: Alcohol Use Psych Involvement: No (comment)  Admission diagnosis:  Peripheral edema [R60.9] Tachycardia [R00.0] Elevated liver enzymes [R74.8] Fluid overload [E87.70] Alcohol use disorder, severe, dependence (HCC) [F10.20] Hypervolemia, unspecified hypervolemia type [E87.70] Patient Active Problem List   Diagnosis Date Noted  . Fluid overload 01/11/2020  . Alcohol abuse with intoxication (HCC) 01/11/2020  . Leg swelling 01/11/2020  PCP:  [facility] Public Pharmacy:   [facility] [phone] - [place], [place] - [address] SEC [address] [place] [address] Phone: [phone] Fax: [phone]     Social Determinants of Health (SDOH) Interventions    Readmission Risk Interventions No flowsheet data found.

## 2020-01-12 NOTE — Progress Notes (Signed)
RN called due to patient going into A. fib with HR in the 140s.  EKG was ordered so as confirm A. fib.  EKG returned with patient being in A. fib.  Patient was already transferred to ICU, patient's rate on the monitor was confirmed with ICU nurse with HR in the 140s to 165.  IV Cardizem 10 Mg x1 was given.  IV Cardizem and IV heparin were ordered with plan to initiate if patient continues to be in A. fib despite above measures.  Plan was also communicated directly with the ICU RN.

## 2020-01-12 NOTE — Progress Notes (Signed)
PROGRESS NOTE    Omar Mccarthy  ZWC:[id number] DOB: Nov 02, 1990 DOA: 01/11/2020 PCP: [facility]    Brief Narrative:  HPI: Omar Mccarthy is a 29 y.o. male with medical history significant for hypertension.  Patient presented to the ED with complaints of bilateral lower extremity swelling for the past several months.  He also reports intermittent vomiting, difficulty breathing also intermittent. Patient was in the ED in March, after he left the ED, his swelling improved at that time, but then reoccurred again.  He reports abdominal bloating, he also reports weight gain, his baseline weight is about 240, but his weight was checked today at the health department, it was 273. He reports difficulty breathing, intermittent, stable and unchanged 2-3 pillow orthopnea.  He reports chest pain ongoing over several years, intermittent, not related to activity, described both as sharp, pressure-like and burning, and usually related to his alcohol withdrawal episodes.  No family history of heart disease in parents or siblings. Patient drinks 24-26 beers daily or drinks half a gallon of vodka daily.  His last drink was at about 4 PM, just prior to arrival in the ED, 6 hours ago. Patient reports about 2 weeks ago, he had several episodes of black stools/abnormal colored stools, this lasted for about 2 months.  No vomiting.  Reports occasionally seeing blood when he wipes after a bowel movement.  He has not received the Covid vaccine.  ED Course: Tachycardic up to 150s.  BNP elevated 279.  Blood alcohol level elevated at 364.  Ammonia 62.  Lipase 112.  Troponin X 24.  Hemoglobin 11.7 down from 14.65 months ago..  Left lower extremity venous Dopplers negative for DVT.  Portable chest x-ray without acute abnormality. 1 L bolus given in ED with improvement in patient's heart rate.  Hospitalist to admit for alcohol intoxication and volume overload.   Assessment & Plan:   Principal  Problem:   Fluid overload Active Problems:   Alcohol abuse with intoxication (HCC)   Leg swelling   1. Atrial fibrillation with rapid ventricular response.  Currently heart rate in the 150s.  EKG confirms atrial fibrillation.  Currently on Cardizem infusion which is at 50 mg/h.  We will add IV metoprolol.  Can also consider digoxin if needed.  Echocardiogram has been ordered.  He has been anticoagulated with heparin.  CHA2DS2-VASc score of 0 at this time.  Will consider discontinuing heparin if CT angiogram of the chest is negative. 2. Lower extremity shortness of breath.  Patient noted to initially be short of breath on admission.  He is noted to be tachycardic with a heart rate in the 150s.  Left lower extremity is swollen with erythema.  Venous Dopplers negative for DVT.  Check CT chest to rule out PE.  If negative, will discontinue further intravenous heparin. 3. Elevated liver enzymes.  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  Viral hepatitis panel in process.  Abdominal ultrasound unrevealing.  Discriminant function 17, currently not a candidate for steroids.  Continue to follow liver enzymes. 4. Thrombocytopenia.  Suspect this is related to alcohol use.  Continue to follow. 5. Lactic acidosis.  Does not appear to be septic at this time.  Possibly related to alcohol.  Trending down. 6. Cellulitis of left lower extremity.  Noted to be erythematous, warm to touch.  Will start on Ancef. 7. Alcohol withdrawal.  Patient is high risk for worsening alcohol withdrawal.  He is already tremulous.  He has been started on CIWA protocol  with Ativan.  May need Precedex if symptoms worsen.   DVT prophylaxis: SCDs Start: 01/11/20 2316, heparin infusion  Code Status: Full code Family Communication: updated patients mother, Rayan Ines [phone] Disposition Plan: Status is: Inpatient  Remains inpatient appropriate because:IV treatments appropriate due to intensity of illness or inability to take  PO   Dispo: The patient is from: Home              Anticipated d/c is to: Home              Anticipated d/c date is: 2 days              Patient currently is not medically stable to d/c.         Consultants:     Procedures:     Antimicrobials:   Ancef 8/18.     Subjective: Patient denies any shortness of breath.  His p.o. intake has been poor but has not had any vomiting.  He does admit to drinking daily/heavily.  Objective: Vitals:   01/12/20 0915 01/12/20 0930 01/12/20 0945 01/12/20 1000  BP: (!) 112/91 (!) 147/94 (!) 127/98 (!) 136/92  Pulse: 91 (!) 110 (!) 122 84  Resp: 12 10 10 10   Temp:      TempSrc:      SpO2: 98% 98% 99% 99%  Weight:      Height:        Intake/Output Summary (Last 24 hours) at 01/12/2020 1135 Last data filed at 01/11/2020 2341 Gross per 24 hour  Intake --  Output 450 ml  Net -450 ml   Filed Weights   01/11/20 1314 01/11/20 1426 01/12/20 0649  Weight: 123.8 kg 123 kg 125.3 kg    Examination:  General exam: Appears calm and comfortable  Respiratory system: Clear to auscultation. Respiratory effort normal. Cardiovascular system: S1 & S2 heard, irregular. No JVD, murmurs, rubs, gallops or clicks.  Gastrointestinal system: Abdomen is nondistended, soft and nontender. No organomegaly or masses felt. Normal bowel sounds heard. Central nervous system: Alert and oriented. No focal neurological deficits.  Patient is tremulous Extremities: Swelling of left lower extremity Skin: Erythema noted over left lower leg Psychiatry: Judgement and insight appear normal. Mood & affect appropriate.     Data Reviewed: I have personally reviewed following labs and imaging studies  CBC: Recent Labs  Lab 01/11/20 1914 01/11/20 1956 01/12/20 0339  WBC 4.5  --  4.1  NEUTROABS 3.1  --   --   HGB 11.7* 11.9* 10.8*  HCT 34.7* 35.0* 32.8*  MCV 105.2*  --  105.8*  PLT 96*  --  76*   Basic Metabolic Panel: Recent Labs  Lab 01/11/20 1914  01/11/20 1956 01/12/20 0339  NA 139 140 138  K 4.3 4.5 4.2  CL 106 105 108  CO2 18*  --  19*  GLUCOSE 120* 113* 119*  BUN 5* 3* 6  CREATININE 0.60* 1.20 0.61  CALCIUM 8.3*  --  8.1*  MG 2.0  --   --   PHOS 4.4  --   --    GFR: Estimated Creatinine Clearance: 173.1 mL/min (by C-G formula based on SCr of 0.61 mg/dL). Liver Function Tests: Recent Labs  Lab 01/11/20 1914 01/12/20 0339  AST 203* 209*  ALT 102* 94*  ALKPHOS 138* 157*  BILITOT 1.4* 1.2  PROT 7.1 6.6  ALBUMIN 3.6 3.3*   Recent Labs  Lab 01/11/20 1914  LIPASE 112*   Recent Labs  Lab 01/11/20 1914  AMMONIA 62*   Coagulation Profile: Recent Labs  Lab 01/11/20 1914  INR 1.2   Cardiac Enzymes: No results for input(s): CKTOTAL, CKMB, CKMBINDEX, TROPONINI in the last 168 hours. BNP (last 3 results) No results for input(s): PROBNP in the last 8760 hours. HbA1C: No results for input(s): HGBA1C in the last 72 hours. CBG: No results for input(s): GLUCAP in the last 168 hours. Lipid Profile: No results for input(s): CHOL, HDL, LDLCALC, TRIG, CHOLHDL, LDLDIRECT in the last 72 hours. Thyroid Function Tests: Recent Labs    01/11/20 1914  TSH 3.890   Anemia Panel: Recent Labs    01/12/20 0339  VITAMINB12 478  FOLATE 9.2  FERRITIN 807*  TIBC 317  IRON 83  RETICCTPCT 3.0   Sepsis Labs: Recent Labs  Lab 01/11/20 1914 01/11/20 2112 01/12/20 0339  LATICACIDVEN 2.2* 3.2* 2.0*    Recent Results (from the past 240 hour(s))  SARS Coronavirus 2 by RT PCR (hospital order, performed in [facility] hospital lab) Nasopharyngeal Nasopharyngeal Swab     Status: None   Collection Time: 01/11/20  7:01 PM   Specimen: Nasopharyngeal Swab  Result Value Ref Range Status   SARS Coronavirus 2 NEGATIVE NEGATIVE Final    Comment: (NOTE) SARS-CoV-2 target nucleic acids are NOT DETECTED.  The SARS-CoV-2 RNA is generally detectable in upper and lower respiratory specimens during the acute phase of infection. The  lowest concentration of SARS-CoV-2 viral copies this assay can detect is 250 copies / mL. A negative result does not preclude SARS-CoV-2 infection and should not be used as the sole basis for treatment or other patient management decisions.  A negative result may occur with improper specimen collection / handling, submission of specimen other than nasopharyngeal swab, presence of viral mutation(s) within the areas targeted by this assay, and inadequate number of viral copies (<250 copies / mL). A negative result must be combined with clinical observations, patient history, and epidemiological information.  Fact Sheet for Patients:   BoilerBrush.com.cy  Fact Sheet for Healthcare Providers: https://pope.com/  This test is not yet approved or  cleared by the [place] FDA and has been authorized for detection and/or diagnosis of SARS-CoV-2 by FDA under an Emergency Use Authorization (EUA).  This EUA will remain in effect (meaning this test can be used) for the duration of the COVID-19 declaration under Section 564(b)(1) of the Act, 21 U.S.C. section 360bbb-3(b)(1), unless the authorization is terminated or revoked sooner.  Performed at [facility], [address], [place], [place] [address]   Culture, blood (routine x 2)     Status: None (Preliminary result)   Collection Time: 01/11/20  7:14 PM   Specimen: BLOOD RIGHT ARM  Result Value Ref Range Status   Specimen Description BLOOD RIGHT ARM  Final   Special Requests   Final    BOTTLES DRAWN AEROBIC AND ANAEROBIC Blood Culture adequate volume   Culture   Final    NO GROWTH < 12 HOURS Performed at [facility], [address], [place], [place] [address]    Report Status PENDING  Incomplete  Culture, blood (routine x 2)     Status: None (Preliminary result)   Collection Time: 01/11/20  7:24 PM   Specimen: BLOOD RIGHT FOREARM  Result Value Ref Range Status   Specimen Description  BLOOD RIGHT FOREARM  Final   Special Requests   Final    BOTTLES DRAWN AEROBIC AND ANAEROBIC Blood Culture adequate volume   Culture   Final    NO  GROWTH < 12 HOURS Performed at [facility], [address], [place], [place] [address]    Report Status PENDING  Incomplete         Radiology Studies: US Venous Img Lower Unilateral Left  Result Date: 01/11/2020 CLINICAL DATA:  Lower extremity pain and edema EXAM: LEFT LOWER EXTREMITY VENOUS DUPLEX ULTRASOUND TECHNIQUE: Gray-scale sonography with graded compression, as well as color Doppler and duplex ultrasound were performed to evaluate the left lower extremity deep venous system from the level of the common femoral vein and including the common femoral, femoral, profunda femoral, popliteal and calf veins including the posterior tibial, peroneal and gastrocnemius veins when visible. The superficial great saphenous vein was also interrogated. Spectral Doppler was utilized to evaluate flow at rest and with distal augmentation maneuvers in the common femoral, femoral and popliteal veins. COMPARISON:  None. FINDINGS: Contralateral Common Femoral Vein: Respiratory phasicity is normal and symmetric with the symptomatic side. No evidence of thrombus. Normal compressibility. Common Femoral Vein: No evidence of thrombus. Normal compressibility, respiratory phasicity and response to augmentation. Saphenofemoral Junction: No evidence of thrombus. Normal compressibility and flow on color Doppler imaging. Profunda Femoral Vein: No evidence of thrombus. Normal compressibility and flow on color Doppler imaging. Femoral Vein: No evidence of thrombus. Normal compressibility, respiratory phasicity and response to augmentation. Popliteal Vein: No evidence of thrombus. Normal compressibility, respiratory phasicity and response to augmentation. Calf Veins: No evidence of thrombus. Normal compressibility and flow on color Doppler imaging. Superficial Great Saphenous Vein: No  evidence of thrombus. Normal compressibility. Venous Reflux:  None. Other Findings:  There is calf region edema. IMPRESSION: No evidence of deep venous thrombosis in the left lower extremity. Right common femoral vein patent. There is left lower extremity calf region edema. Electronically Signed   By: Bretta Bang III M.D.   On: 01/11/2020 15:19   DG Chest Port 1 View  Result Date: 01/11/2020 CLINICAL DATA:  29 year old male with history of tachycardia and leg swelling. EXAM: PORTABLE CHEST 1 VIEW COMPARISON:  Chest x-ray 08/04/2019. FINDINGS: Lung volumes are normal. No consolidative airspace disease. No pleural effusions. No pneumothorax. No pulmonary nodule or mass noted. Pulmonary vasculature and the cardiomediastinal silhouette are within normal limits. IMPRESSION: No radiographic evidence of acute cardiopulmonary disease. Electronically Signed   By: Trudie Reed M.D.   On: 01/11/2020 19:58   US Abdomen Limited RUQ  Result Date: 01/12/2020 CLINICAL DATA:  Elevated liver enzymes EXAM: ULTRASOUND ABDOMEN LIMITED RIGHT UPPER QUADRANT COMPARISON:  None. FINDINGS: Gallbladder: No gallstones or wall thickening visualized. There is no pericholecystic fluid. No sonographic Murphy sign noted by sonographer. Common bile duct: Diameter: 4 mm. No intrahepatic or extrahepatic biliary duct dilatation. Liver: No focal lesion identified. Liver echogenicity is increased diffusely. Portal vein is patent on color Doppler imaging with normal direction of blood flow towards the liver. Other: None. IMPRESSION: Diffuse increase in liver echogenicity, a finding indicative of hepatic steatosis, potentially with underlying parenchymal liver disease as well. No focal liver lesions are evident; it must be cautioned that the sensitivity of ultrasound for detection of focal liver lesions is diminished in this circumstance. Study otherwise unremarkable. Electronically Signed   By: Bretta Bang III M.D.   On: 01/12/2020  10:12        Scheduled Meds: . Chlorhexidine Gluconate Cloth  6 each Topical Daily  . folic acid  1 mg Oral Daily  . LORazepam  0-4 mg Intravenous Q6H   Followed by  . [START ON 01/13/2020] LORazepam  0-4 mg Intravenous Q12H  . mouth rinse  15 mL Mouth Rinse BID  . metoprolol tartrate  5 mg Intravenous Q6H  . multivitamin with minerals  1 tablet Oral Daily  . nicotine  21 mg Transdermal Once  . thiamine  100 mg Oral Daily   Or  . thiamine  100 mg Intravenous Daily   Continuous Infusions: .  ceFAZolin (ANCEF) IV    . diltiazem (CARDIZEM) infusion 5 mg/hr (01/12/20 0921)  . heparin 1,400 Units/hr (01/12/20 0847)     LOS: 1 day    Critical care procedure note Authorized and performed by: Erick Blinks Total critical care time: Approximately 35 minutes Due to high probability of clinically significant, life-threatening deterioration, the patient required my highest level of preparedness to intervene emergently and I personally spent this critical care time directly and personally managing the patient.  The critical care time included obtaining a history, examining the patient, pulse oximetry, ordering and review of studies, arranging urgent treatment with development of a management plan, evaluation of patient's response to treatment, frequent reassessment, discussions with other providers.  Critical care time was performed to assess and manage the high probability of imminent, life-threatening deterioration that could result in multiorgan failure.  It was exclusive of separate billable procedures and treating other patients and teaching time.  Please see MDM section and the rest of the of note for further information on patient assessment and treatment     Erick Blinks, MD [facility]   If 7PM-7AM, please contact night-coverage [email or web address]  01/12/2020, 11:35 AM

## 2020-01-12 NOTE — Progress Notes (Signed)
Midlevel aware of blood culture result.

## 2020-01-12 NOTE — Progress Notes (Addendum)
ANTICOAGULATION CONSULT NOTE -   Pharmacy Consult for heparin Indication: atrial fibrillation  No Known Allergies  Patient Measurements: Height: 5\' 7"  (170.2 cm) Weight: 125.3 kg (276 lb 3.8 oz) IBW/kg (Calculated) : 66.1 HEPARIN DW (KG): 95.4  Vital Signs: Temp: 98.5 F (36.9 C) (08/18 1149) Temp Source: Oral (08/18 1149) BP: 141/88 (08/18 1400) Pulse Rate: 132 (08/18 1100)  Labs: Recent Labs    01/11/20 1914 01/11/20 1914 01/11/20 1956 01/11/20 2112 01/12/20 0339 01/12/20 1342  HGB 11.7*   < > 11.9*  --  10.8*  --   HCT 34.7*  --  35.0*  --  32.8*  --   PLT 96*  --   --   --  76*  --   LABPROT 14.5  --   --   --   --   --   INR 1.2  --   --   --   --   --   HEPARINUNFRC  --   --   --   --   --  0.30  CREATININE 0.60*  --  1.20  --  0.61  --   TROPONINIHS 24*  --   --  23*  --   --    < > = values in this interval not displayed.    Estimated Creatinine Clearance: 173.1 mL/min (by C-G formula based on SCr of 0.61 mg/dL).   Medical History: Past Medical History:  Diagnosis Date   Hypertension     Medications:  No medications prior to admission.   Assessment: Patient presented with difficulty breathing and leg swelling. He reports difficulty breathing, intermittent, stable and unchanged 2-3 pillow orthopnea. Patient drinks 24-26 beers daily or drinks half a gallon of vodka daily.  His last drink was at about 4 PM, just prior to arrival in the ED, 6 hours ago. Patient with new onset afib. Pharmacy asked to start heparin. HL is therapeutic at 0.3. CT angiogram is negative for PE Goal of Therapy:  Heparin level 0.3-0.7 units/ml Monitor platelets by anticoagulation protocol: Yes   Plan:  MD stopped heparin   01/14/20, BS Elder Cyphers, BCPS Clinical Pharmacist Pager [phone] 01/12/2020,2:59 PM

## 2020-01-12 NOTE — Progress Notes (Signed)
ANTICOAGULATION CONSULT NOTE - Initial Consult  Pharmacy Consult for heparin Indication: atrial fibrillation  No Known Allergies  Patient Measurements: Height: 5\' 7"  (170.2 cm) Weight: 125.3 kg (276 lb 3.8 oz) IBW/kg (Calculated) : 66.1 HEPARIN DW (KG): 95.4  Vital Signs: Temp: 97.8 F (36.6 C) (08/18 0649) Temp Source: Oral (08/18 0649) BP: 137/92 (08/18 0700) Pulse Rate: 52 (08/18 0700)  Labs: Recent Labs    01/11/20 1914 01/11/20 1914 01/11/20 1956 01/11/20 2112 01/12/20 0339  HGB 11.7*   < > 11.9*  --  10.8*  HCT 34.7*  --  35.0*  --  32.8*  PLT 96*  --   --   --  76*  LABPROT 14.5  --   --   --   --   INR 1.2  --   --   --   --   CREATININE 0.60*  --  1.20  --  0.61  TROPONINIHS 24*  --   --  23*  --    < > = values in this interval not displayed.    Estimated Creatinine Clearance: 173.1 mL/min (by C-G formula based on SCr of 0.61 mg/dL).   Medical History: Past Medical History:  Diagnosis Date  . Hypertension     Medications:  No medications prior to admission.   Assessment: Patient presented with difficulty breathing and leg swelling. He reports difficulty breathing, intermittent, stable and unchanged 2-3 pillow orthopnea. Patient drinks 24-26 beers daily or drinks half a gallon of vodka daily.  His last drink was at about 4 PM, just prior to arrival in the ED, 6 hours ago. Patient with new onset afib. Pharmacy asked to start heparin.  Goal of Therapy:  Heparin level 0.3-0.7 units/ml Monitor platelets by anticoagulation protocol: Yes   Plan:  Give 4000 units bolus x 1 Start heparin infusion at 1400 units/hr Check anti-Xa level in ~6-8 hours and daily while on heparin Continue to monitor H&H and platelets   01/14/20, BS Elder Cyphers, BCPS Clinical Pharmacist Pager [phone] 01/12/2020,7:33 AM

## 2020-01-13 ENCOUNTER — Inpatient Hospital Stay (HOSPITAL_COMMUNITY): Payer: Self-pay

## 2020-01-13 ENCOUNTER — Inpatient Hospital Stay (HOSPITAL_COMMUNITY): Payer: Self-pay | Admitting: Anesthesiology

## 2020-01-13 DIAGNOSIS — R0603 Acute respiratory distress: Secondary | ICD-10-CM

## 2020-01-13 DIAGNOSIS — R000 Tachycardia, unspecified: Secondary | ICD-10-CM

## 2020-01-13 DIAGNOSIS — I340 Nonrheumatic mitral (valve) insufficiency: Secondary | ICD-10-CM

## 2020-01-13 LAB — BLOOD GAS, ARTERIAL
Acid-base deficit: 5 mmol/L — ABNORMAL HIGH (ref 0.0–2.0)
Acid-base deficit: 4.6 mmol/L — ABNORMAL HIGH (ref 0.0–2.0)
Bicarbonate: 21.3 mmol/L (ref 20.0–28.0)
Bicarbonate: 21.3 mmol/L (ref 20.0–28.0)
Drawn by: 2305
FIO2: 36
FIO2: 100
MECHVT: 530 mL
O2 Saturation: 99.1 %
O2 Saturation: 99.8 %
PEEP: 50 cmH2O
Patient temperature: 38.2
Patient temperature: 37
RATE: 28 resp/min
pCO2 arterial: 25.9 mmHg — ABNORMAL LOW (ref 32.0–48.0)
pCO2 arterial: 28.5 mmHg — ABNORMAL LOW (ref 32.0–48.0)
pH, Arterial: 7.461 — ABNORMAL HIGH (ref 7.350–7.450)
pH, Arterial: 7.437 (ref 7.350–7.450)
pO2, Arterial: 139 mmHg — ABNORMAL HIGH (ref 83.0–108.0)
pO2, Arterial: 287 mmHg — ABNORMAL HIGH (ref 83.0–108.0)

## 2020-01-13 LAB — COMPREHENSIVE METABOLIC PANEL
ALT: 78 U/L — ABNORMAL HIGH (ref 0–44)
AST: 114 U/L — ABNORMAL HIGH (ref 15–41)
Albumin: 3.4 g/dL — ABNORMAL LOW (ref 3.5–5.0)
Alkaline Phosphatase: 151 U/L — ABNORMAL HIGH (ref 38–126)
Anion gap: 14 (ref 5–15)
BUN: 8 mg/dL (ref 6–20)
CO2: 20 mmol/L — ABNORMAL LOW (ref 22–32)
Calcium: 8.6 mg/dL — ABNORMAL LOW (ref 8.9–10.3)
Chloride: 102 mmol/L (ref 98–111)
Creatinine, Ser: 0.9 mg/dL (ref 0.61–1.24)
GFR calc Af Amer: >60 mL/min (ref >60)
GFR calc non Af Amer: >60 mL/min (ref >60)
Glucose, Bld: 160 mg/dL — ABNORMAL HIGH (ref 70–99)
Potassium: 4.4 mmol/L (ref 3.5–5.1)
Sodium: 136 mmol/L (ref 135–145)
Total Bilirubin: 2.8 mg/dL — ABNORMAL HIGH (ref 0.3–1.2)
Total Protein: 6.9 g/dL (ref 6.5–8.1)

## 2020-01-13 LAB — CBC
HCT: 36.7 % — ABNORMAL LOW (ref 39.0–52.0)
Hemoglobin: 12.2 g/dL — ABNORMAL LOW (ref 13.0–17.0)
MCH: 35.3 pg — ABNORMAL HIGH (ref 26.0–34.0)
MCHC: 33.2 g/dL (ref 30.0–36.0)
MCV: 106.1 fL — ABNORMAL HIGH (ref 80.0–100.0)
Platelets: 65 10*3/uL — ABNORMAL LOW (ref 150–400)
RBC: 3.46 MIL/uL — ABNORMAL LOW (ref 4.22–5.81)
RDW: 16 % — ABNORMAL HIGH (ref 11.5–15.5)
WBC: 6 10*3/uL (ref 4.0–10.5)
nRBC: 0 % (ref 0.0–0.2)

## 2020-01-13 LAB — CULTURE, BLOOD (ROUTINE X 2): Special Requests: ADEQUATE

## 2020-01-13 LAB — ECHOCARDIOGRAM COMPLETE
Area-P 1/2: 7.51 cm2
Height: 67 in
S' Lateral: 2.93 cm
Weight: 4437.42 oz

## 2020-01-13 LAB — LACTIC ACID, PLASMA: Lactic Acid, Venous: 4.5 mmol/L (ref 0.5–1.9)

## 2020-01-13 LAB — PROTIME-INR
INR: 1.3 — ABNORMAL HIGH (ref 0.8–1.2)
Prothrombin Time: 15.2 seconds (ref 11.4–15.2)

## 2020-01-13 LAB — LIPASE, BLOOD: Lipase: 40 U/L (ref 11–51)

## 2020-01-13 MED ORDER — ACETAMINOPHEN 650 MG RE SUPP
650.0000 mg | Freq: Four times a day (QID) | RECTAL | Status: DC | PRN
  Administered 2020-01-13 (×2): 650 mg via RECTAL
  Filled 2020-01-13: qty 1

## 2020-01-13 MED ORDER — LACTATED RINGERS IV SOLN: INTRAVENOUS | Status: AC

## 2020-01-13 MED ORDER — FENTANYL BOLUS VIA INFUSION
50.0000 ug | INTRAVENOUS | Status: DC | PRN
  Administered 2020-01-14 – 2020-01-16 (×17): 50 ug via INTRAVENOUS
  Filled 2020-01-13: qty 50

## 2020-01-13 MED ORDER — DROPERIDOL 2.5 MG/ML IJ SOLN
5.0000 mg | Freq: Once | INTRAMUSCULAR | Status: AC
  Administered 2020-01-13: 5 mg via INTRAVENOUS

## 2020-01-13 MED ORDER — PANTOPRAZOLE SODIUM 40 MG IV SOLR
40.0000 mg | Freq: Every day | INTRAVENOUS | Status: DC
  Administered 2020-01-13 – 2020-01-14 (×2): 40 mg via INTRAVENOUS
  Filled 2020-01-13 (×2): qty 40

## 2020-01-13 MED ORDER — FENTANYL CITRATE (PF) 100 MCG/2ML IJ SOLN
50.0000 ug | Freq: Once | INTRAMUSCULAR | Status: AC
  Administered 2020-01-13: 50 ug via INTRAVENOUS
  Filled 2020-01-13: qty 2

## 2020-01-13 MED ORDER — POLYETHYLENE GLYCOL 3350 17 G PO PACK
17.0000 g | PACK | Freq: Every day | ORAL | Status: DC
  Administered 2020-01-13 – 2020-01-16 (×4): 17 g via ORAL
  Filled 2020-01-13 (×8): qty 1

## 2020-01-13 MED ORDER — DOCUSATE SODIUM 50 MG/5ML PO LIQD
100.0000 mg | Freq: Two times a day (BID) | ORAL | Status: DC
  Filled 2020-01-13 (×8): qty 10

## 2020-01-13 MED ORDER — ROCURONIUM BROMIDE 100 MG/10ML IV SOLN
INTRAVENOUS | Status: DC | PRN
  Administered 2020-01-13 (×2): 50 mg via INTRAVENOUS

## 2020-01-13 MED ORDER — PROPOFOL 1000 MG/100ML IV EMUL
0.0000 ug/kg/min | INTRAVENOUS | Status: DC
  Administered 2020-01-13: 40 ug/kg/min via INTRAVENOUS
  Administered 2020-01-13: 5 ug/kg/min via INTRAVENOUS
  Administered 2020-01-14 (×2): 40 ug/kg/min via INTRAVENOUS
  Administered 2020-01-14: 35 ug/kg/min via INTRAVENOUS
  Administered 2020-01-14: 40 ug/kg/min via INTRAVENOUS
  Administered 2020-01-14: 50 ug/kg/min via INTRAVENOUS
  Administered 2020-01-14: 35 ug/kg/min via INTRAVENOUS
  Administered 2020-01-15 (×5): 50 ug/kg/min via INTRAVENOUS
  Administered 2020-01-15: 40 ug/kg/min via INTRAVENOUS
  Administered 2020-01-15 – 2020-01-16 (×7): 50 ug/kg/min via INTRAVENOUS
  Filled 2020-01-13 (×20): qty 100

## 2020-01-13 MED ORDER — PERFLUTREN LIPID MICROSPHERE
1.0000 mL | INTRAVENOUS | Status: AC | PRN
  Administered 2020-01-13: 1 mL via INTRAVENOUS
  Filled 2020-01-13: qty 10

## 2020-01-13 MED ORDER — PROPOFOL 1000 MG/100ML IV EMUL
INTRAVENOUS | Status: AC
  Filled 2020-01-13: qty 100

## 2020-01-13 MED ORDER — CHLORHEXIDINE GLUCONATE 0.12% ORAL RINSE (MEDLINE KIT)
15.0000 mL | Freq: Two times a day (BID) | OROMUCOSAL | Status: DC
  Administered 2020-01-13 – 2020-01-16 (×6): 15 mL via OROMUCOSAL

## 2020-01-13 MED ORDER — PROPOFOL 10 MG/ML IV BOLUS
INTRAVENOUS | Status: DC | PRN
  Administered 2020-01-13: 100 mg via INTRAVENOUS

## 2020-01-13 MED ORDER — DROPERIDOL 2.5 MG/ML IJ SOLN: 2.5000 mg | Freq: Once | INTRAMUSCULAR | Status: DC

## 2020-01-13 MED ORDER — ORAL CARE MOUTH RINSE
15.0000 mL | OROMUCOSAL | Status: DC
  Administered 2020-01-13 – 2020-01-16 (×30): 15 mL via OROMUCOSAL

## 2020-01-13 MED ORDER — FENTANYL 2500MCG IN NS 250ML (10MCG/ML) PREMIX INFUSION
50.0000 ug/h | INTRAVENOUS | Status: DC
  Administered 2020-01-13: 50 ug/h via INTRAVENOUS
  Administered 2020-01-14: 100 ug/h via INTRAVENOUS
  Administered 2020-01-15 – 2020-01-16 (×3): 200 ug/h via INTRAVENOUS
  Filled 2020-01-13 (×5): qty 250

## 2020-01-13 MED ORDER — SUCCINYLCHOLINE CHLORIDE 20 MG/ML IJ SOLN
INTRAMUSCULAR | Status: DC | PRN
  Administered 2020-01-13: 140 mg via INTRAVENOUS

## 2020-01-13 NOTE — Progress Notes (Signed)
Nurse notified MD pt's HR 140s-170s, RR 40-50, and BP WNLs; orders pending

## 2020-01-13 NOTE — Progress Notes (Signed)
*  PRELIMINARY RESULTS* Echocardiogram 2D Echocardiogram with definity has been performed.  Omar Mccarthy 01/13/2020, 12:40 PM

## 2020-01-13 NOTE — Progress Notes (Addendum)
PROGRESS NOTE    BARRETT GOLDIE  WIO:[id number] DOB: Jun 01, 1990 DOA: 01/11/2020 PCP: [facility]    Brief Narrative:  HPI: Omar Mccarthy is a 29 y.o. male with medical history significant for hypertension.  Patient presented to the ED with complaints of bilateral lower extremity swelling for the past several months.  He also reports intermittent vomiting, difficulty breathing also intermittent. Patient was in the ED in March, after he left the ED, his swelling improved at that time, but then reoccurred again.  He reports abdominal bloating, he also reports weight gain, his baseline weight is about 240, but his weight was checked today at the health department, it was 273. He reports difficulty breathing, intermittent, stable and unchanged 2-3 pillow orthopnea.  He reports chest pain ongoing over several years, intermittent, not related to activity, described both as sharp, pressure-like and burning, and usually related to his alcohol withdrawal episodes.  No family history of heart disease in parents or siblings. Patient drinks 24-26 beers daily or drinks half a gallon of vodka daily.  His last drink was at about 4 PM, just prior to arrival in the ED, 6 hours ago. Patient reports about 2 weeks ago, he had several episodes of black stools/abnormal colored stools, this lasted for about 2 months.  No vomiting.  Reports occasionally seeing blood when he wipes after a bowel movement.  He has not received the Covid vaccine.  ED Course: Tachycardic up to 150s.  BNP elevated 279.  Blood alcohol level elevated at 364.  Ammonia 62.  Lipase 112.  Troponin X 24.  Hemoglobin 11.7 down from 14.65 months ago..  Left lower extremity venous Dopplers negative for DVT.  Portable chest x-ray without acute abnormality. 1 L bolus given in ED with improvement in patient's heart rate.  Hospitalist to admit for alcohol intoxication and volume overload.   Assessment & Plan:   Principal  Problem:   Fluid overload Active Problems:   Alcohol abuse with intoxication (HCC)   Leg swelling   1. Atrial fibrillation with rapid ventricular response.  Heart rate remains elevated.  He is on Cardizem infusion as well as IV metoprolol pushes.  Echocardiogram shows ejection fraction of 40%.  CHA2DS2-VASc score of 1 at this time.  Does not appear to be a candidate for anticoagulation with low CHA2DS2-VASc score as well as ongoing issues with alcoholism. 2. Lower extremity swelling and shortness of breath.  Patient noted to initially be short of breath on admission.  He is noted to be tachycardic with a heart rate in the 150s.  He had some swelling in his left lower extremity with erythema.  Venous Dopplers negative for DVT.  CT angiogram of chest negative for pulmonary embolus. 3. Elevated liver enzymes.  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  Viral hepatitis panel negative, abdominal ultrasound unrevealing.  Discriminant function 12.9, currently not a candidate for steroids.  Transaminases are trending down. 4. Thrombocytopenia.  Suspect this is related to alcohol use.  Continue to follow. 5. Lactic acidosis.  Does not appear to be septic at this time.  Possibly related to alcohol.  Trending down. 6. Cellulitis of left lower extremity.  Noted to be erythematous, warm to touch.  Currently on Ancef. 7. Fever.  Patient noted to have fever overnight.  Possibly due to alcohol withdrawal.  He has 1 out of 2 positive blood cultures for coagulase-negative staph.  Repeat cultures have been sent today. 8. Alcohol withdrawal with delirium tremens.  Patient started on  Precedex for worsening alcohol withdrawal.  Despite maxing out on Precedex, he is required restraints and intermittent doses of Ativan.  Despite these measures, patient remains tachypneic, tachycardic.  Respiratory rate in the 40s to 50s, heart rate in the 120s to 130s.  ABG shows normal pH.  By mental status, he is significantly lethargic at  this point.  It appears that he does need more sedation and would likely benefit from propofol.  Current measures do not appear to be adequate to control his withdrawal.  I am concerned that he may decompensate overnight without adequate sedation.  We will electively intubate patient and started on propofol to help manage delirium tremens. 9. Cardiomyopathy. EF of 40% on echocardiogram.  Possibly tachycardia induced from atrial fibrillation versus alcohol.  Discussed with Dr. Domenic Polite with recommendations to continue medical management on beta-blockers.  No plans for invasive cardiac work-up at this point.  He can be followed up by cardiology as an outpatient.   DVT prophylaxis: SCDs Start: 01/11/20 2316  Code Status: Full code Family Communication: updated patients mother, Floyde Dingley [phone] Disposition Plan: Status is: Inpatient  Remains inpatient appropriate because:IV treatments appropriate due to intensity of illness or inability to take PO   Dispo: The patient is from: Home              Anticipated d/c is to: Home              Anticipated d/c date is: 2 days              Patient currently is not medically stable to d/c.   Consultants:     Procedures:     Antimicrobials:   Ancef 8/18>   Subjective: Patient started on Precedex overnight.  He is increasingly agitated, confused.  He is in restraints.  Respiratory rate is elevated.  He is on 4 L of oxygen.  Objective: Vitals:   01/13/20 1559 01/13/20 1800 01/13/20 1830 01/13/20 1900  BP:  120/81 105/86 105/81  Pulse: (!) 101 79 (!) 112 98  Resp:  (!) 28 (!) 44 (!) 42  Temp: 98.2 F (36.8 C)     TempSrc: Oral     SpO2: 93% 93% 99% 92%  Weight:      Height:        Intake/Output Summary (Last 24 hours) at 01/13/2020 1927 Last data filed at 01/13/2020 1856 Gross per 24 hour  Intake 1894.08 ml  Output 700 ml  Net 1194.08 ml   Filed Weights   01/11/20 1426 01/12/20 0649 01/13/20 0500  Weight: 123 kg 125.3  kg 125.8 kg    Examination:  General exam: Delirious, in four-point restraints, tremulous, diaphoretic Respiratory system: Clear to auscultation.  Increased respiratory rate Cardiovascular system: Irregular, tachycardic. No murmurs, rubs, gallops. Gastrointestinal system: Abdomen is nondistended, soft and nontender. No organomegaly or masses felt. Normal bowel sounds heard. Central nervous system: Tremulous no focal neurological deficits. Extremities: No C/C/E, +pedal pulses Skin: No rashes, lesions or ulcers Psychiatry: Confused, lethargic     Data Reviewed: I have personally reviewed following labs and imaging studies  CBC: Recent Labs  Lab 01/11/20 1914 01/11/20 1956 01/12/20 0339 01/13/20 0822  WBC 4.5  --  4.1 6.0  NEUTROABS 3.1  --   --   --   HGB 11.7* 11.9* 10.8* 12.2*  HCT 34.7* 35.0* 32.8* 36.7*  MCV 105.2*  --  105.8* 106.1*  PLT 96*  --  76* 65*   Basic Metabolic Panel: Recent Labs  Lab 01/11/20 1914 01/11/20 1956 01/12/20 0339 01/13/20 0822  NA 139 140 138 136  K 4.3 4.5 4.2 4.4  CL 106 105 108 102  CO2 18*  --  19* 20*  GLUCOSE 120* 113* 119* 160*  BUN 5* 3* 6 8  CREATININE 0.60* 1.20 0.61 0.90  CALCIUM 8.3*  --  8.1* 8.6*  MG 2.0  --   --   --   PHOS 4.4  --   --   --    GFR: Estimated Creatinine Clearance: 154.2 mL/min (by C-G formula based on SCr of 0.9 mg/dL). Liver Function Tests: Recent Labs  Lab 01/11/20 1914 01/12/20 0339 01/13/20 0822  AST 203* 209* 114*  ALT 102* 94* 78*  ALKPHOS 138* 157* 151*  BILITOT 1.4* 1.2 2.8*  PROT 7.1 6.6 6.9  ALBUMIN 3.6 3.3* 3.4*   Recent Labs  Lab 01/11/20 1914 01/12/20 0339 01/13/20 1217  LIPASE 112* 117* 40   Recent Labs  Lab 01/11/20 1914  AMMONIA 62*   Coagulation Profile: Recent Labs  Lab 01/11/20 1914 01/13/20 0822  INR 1.2 1.3*   Cardiac Enzymes: No results for input(s): CKTOTAL, CKMB, CKMBINDEX, TROPONINI in the last 168 hours. BNP (last 3 results) No results for  input(s): PROBNP in the last 8760 hours. HbA1C: No results for input(s): HGBA1C in the last 72 hours. CBG: No results for input(s): GLUCAP in the last 168 hours. Lipid Profile: No results for input(s): CHOL, HDL, LDLCALC, TRIG, CHOLHDL, LDLDIRECT in the last 72 hours. Thyroid Function Tests: Recent Labs    01/11/20 1914  TSH 3.890   Anemia Panel: Recent Labs    01/12/20 0339  VITAMINB12 478  FOLATE 9.2  FERRITIN 807*  TIBC 317  IRON 83  RETICCTPCT 3.0   Sepsis Labs: Recent Labs  Lab 01/11/20 1914 01/11/20 2112 01/12/20 0339  LATICACIDVEN 2.2* 3.2* 2.0*    Recent Results (from the past 240 hour(s))  SARS Coronavirus 2 by RT PCR (hospital order, performed in [facility] hospital lab) Nasopharyngeal Nasopharyngeal Swab     Status: None   Collection Time: 01/11/20  7:01 PM   Specimen: Nasopharyngeal Swab  Result Value Ref Range Status   SARS Coronavirus 2 NEGATIVE NEGATIVE Final    Comment: (NOTE) SARS-CoV-2 target nucleic acids are NOT DETECTED.  The SARS-CoV-2 RNA is generally detectable in upper and lower respiratory specimens during the acute phase of infection. The lowest concentration of SARS-CoV-2 viral copies this assay can detect is 250 copies / mL. A negative result does not preclude SARS-CoV-2 infection and should not be used as the sole basis for treatment or other patient management decisions.  A negative result may occur with improper specimen collection / handling, submission of specimen other than nasopharyngeal swab, presence of viral mutation(s) within the areas targeted by this assay, and inadequate number of viral copies (<250 copies / mL). A negative result must be combined with clinical observations, patient history, and epidemiological information.  Fact Sheet for Patients:   StrictlyIdeas.no  Fact Sheet for Healthcare Providers: BankingDealers.co.za  This test is not yet approved or  cleared  by the [place] FDA and has been authorized for detection and/or diagnosis of SARS-CoV-2 by FDA under an Emergency Use Authorization (EUA).  This EUA will remain in effect (meaning this test can be used) for the duration of the COVID-19 declaration under Section 564(b)(1) of the Act, 21 U.S.C. section 360bbb-3(b)(1), unless the authorization is terminated or revoked sooner.  Performed at [facility],  [address] [place], [place] [address]   Culture, blood (routine x 2)     Status: None (Preliminary result)   Collection Time: 01/11/20  7:14 PM   Specimen: BLOOD RIGHT ARM  Result Value Ref Range Status   Specimen Description BLOOD RIGHT ARM  Final   Special Requests   Final    BOTTLES DRAWN AEROBIC AND ANAEROBIC Blood Culture adequate volume   Culture   Final    NO GROWTH 2 DAYS Performed at [facility], [address] [place], [place] [address]    Report Status PENDING  Incomplete  Culture, blood (routine x 2)     Status: Abnormal   Collection Time: 01/11/20  7:24 PM   Specimen: BLOOD RIGHT FOREARM  Result Value Ref Range Status   Specimen Description   Final    BLOOD RIGHT FOREARM Performed at [facility], [address], [place], [place] [address]    Special Requests   Final    BOTTLES DRAWN AEROBIC AND ANAEROBIC Blood Culture adequate volume Performed at [facility], [address], [place], [place] [address]    Culture  Setup Time   Final    GRAM POSITIVE COCCI aerobic bottle Gram Stain Report Called to,Read Back By and Verified With: HILTON, L@ 1660 by MATTHEWS, B 8.18.21 [facility] Organism ID to follow CRITICAL RESULT CALLED TO, READ BACK BY AND VERIFIED WITH: H TETREAULT RN 01/12/20 2254 JDW    Culture (A)  Final    STAPHYLOCOCCUS HOMINIS THE SIGNIFICANCE OF ISOLATING THIS ORGANISM FROM A SINGLE SET OF BLOOD CULTURES WHEN MULTIPLE SETS ARE DRAWN IS UNCERTAIN. PLEASE NOTIFY THE MICROBIOLOGY DEPARTMENT WITHIN ONE WEEK IF SPECIATION AND SENSITIVITIES ARE  REQUIRED. Performed at [facility], [place] [address], [place],  [address]    Report Status 01/13/2020 FINAL  Final  Blood Culture ID Panel (Reflexed)     Status: Abnormal   Collection Time: 01/11/20  7:24 PM  Result Value Ref Range Status   Enterococcus faecalis NOT DETECTED NOT DETECTED Final   Enterococcus Faecium NOT DETECTED NOT DETECTED Final   Listeria monocytogenes NOT DETECTED NOT DETECTED Final   Staphylococcus species DETECTED (A) NOT DETECTED Final    Comment: CRITICAL RESULT CALLED TO, READ BACK BY AND VERIFIED WITH: H TETREAULT RN 01/12/20 2254 JDW    Staphylococcus aureus (BCID) NOT DETECTED NOT DETECTED Final   Staphylococcus epidermidis NOT DETECTED NOT DETECTED Final   Staphylococcus lugdunensis NOT DETECTED NOT DETECTED Final   Streptococcus species NOT DETECTED NOT DETECTED Final   Streptococcus agalactiae NOT DETECTED NOT DETECTED Final   Streptococcus pneumoniae NOT DETECTED NOT DETECTED Final   Streptococcus pyogenes NOT DETECTED NOT DETECTED Final   A.calcoaceticus-baumannii NOT DETECTED NOT DETECTED Final   Bacteroides fragilis NOT DETECTED NOT DETECTED Final   Enterobacterales NOT DETECTED NOT DETECTED Final   Enterobacter cloacae complex NOT DETECTED NOT DETECTED Final   Escherichia coli NOT DETECTED NOT DETECTED Final   Klebsiella aerogenes NOT DETECTED NOT DETECTED Final   Klebsiella oxytoca NOT DETECTED NOT DETECTED Final   Klebsiella pneumoniae NOT DETECTED NOT DETECTED Final   Proteus species NOT DETECTED NOT DETECTED Final   Salmonella species NOT DETECTED NOT DETECTED Final   Serratia marcescens NOT DETECTED NOT DETECTED Final   Haemophilus influenzae NOT DETECTED NOT DETECTED Final   Neisseria meningitidis NOT DETECTED NOT DETECTED Final   Pseudomonas aeruginosa NOT DETECTED NOT DETECTED Final   Stenotrophomonas maltophilia NOT DETECTED NOT DETECTED Final   Candida albicans  NOT DETECTED NOT DETECTED Final   Candida auris NOT DETECTED  NOT DETECTED Final   Candida glabrata NOT DETECTED NOT DETECTED Final   Candida krusei NOT DETECTED NOT DETECTED Final   Candida parapsilosis NOT DETECTED NOT DETECTED Final   Candida tropicalis NOT DETECTED NOT DETECTED Final   Cryptococcus neoformans/gattii NOT DETECTED NOT DETECTED Final    Comment: Performed at [facility], [place] [address] [place], [place] [address]  MRSA PCR Screening     Status: None   Collection Time: 01/12/20  6:45 AM   Specimen: Nasal Mucosa; Nasopharyngeal  Result Value Ref Range Status   MRSA by PCR NEGATIVE NEGATIVE Final    Comment:        The GeneXpert MRSA Assay (FDA approved for NASAL specimens only), is one component of a comprehensive MRSA colonization surveillance program. It is not intended to diagnose MRSA infection nor to guide or monitor treatment for MRSA infections. Performed at [facility], [address], [place], [place] [address]   Culture, blood (routine x 2)     Status: None (Preliminary result)   Collection Time: 01/13/20 12:16 PM   Specimen: BLOOD  Result Value Ref Range Status   Specimen Description BLOOD LEFT ANTECUBITAL  Final   Special Requests   Final    BOTTLES DRAWN AEROBIC AND ANAEROBIC Blood Culture adequate volume Performed at [facility], [address] [place], [place] [address]    Culture PENDING  Incomplete   Report Status PENDING  Incomplete  Culture, blood (routine x 2)     Status: None (Preliminary result)   Collection Time: 01/13/20 12:16 PM   Specimen: BLOOD  Result Value Ref Range Status   Specimen Description BLOOD LEFT HAND  Final   Special Requests   Final    BOTTLES DRAWN AEROBIC AND ANAEROBIC Blood Culture adequate volume Performed at [facility], [address], [place], [place] [address]    Culture PENDING  Incomplete   Report Status PENDING  Incomplete         Radiology Studies: CT ANGIO CHEST PE W OR WO CONTRAST  Result Date: 01/12/2020 CLINICAL DATA:  Bilateral lower  extremity swelling, hypertension, vomiting and difficulty breathing since March, 2021. EXAM: CT ANGIOGRAPHY CHEST WITH CONTRAST TECHNIQUE: Multidetector CT imaging of the chest was performed using the standard protocol during bolus administration of intravenous contrast. Multiplanar CT image reconstructions and MIPs were obtained to evaluate the vascular anatomy. CONTRAST:  100 mL OMNIPAQUE IOHEXOL 350 MG/ML SOLN COMPARISON:  PA and lateral chest today and single-view of the chest 08/04/2019. FINDINGS: Cardiovascular: Satisfactory opacification of the pulmonary arteries to the segmental level. No evidence of pulmonary embolism. Normal heart size. There is cardiomegaly. No pericardial effusion. Mediastinum/Nodes: No enlarged mediastinal, hilar, or axillary lymph nodes. Thyroid gland, trachea, and esophagus demonstrate no significant findings. Lungs/Pleura: Trace pleural effusions. Mild dependent atelectasis is present. Upper Abdomen: There is marked, diffuse fatty infiltration of the visualized liver. Mild stranding is seen about the pancreas. Musculoskeletal: No acute or focal abnormality. Review of the MIP images confirms the above findings. IMPRESSION: Negative for pulmonary embolus. Trace pleural effusions. Severe fatty infiltration of the liver. Mild stranding about the visualized pancreas may be due to pancreatitis. Cardiomegaly. Electronically Signed   By: Inge Rise M.D.   On: 01/12/2020 12:57   DG Chest Port 1 View  Result Date: 01/11/2020 CLINICAL DATA:  29 year old male with history of tachycardia and leg swelling. EXAM: PORTABLE CHEST 1 VIEW COMPARISON:  Chest x-ray  08/04/2019. FINDINGS: Lung volumes are normal. No consolidative airspace disease. No pleural effusions. No pneumothorax. No pulmonary nodule or mass noted. Pulmonary vasculature and the cardiomediastinal silhouette are within normal limits. IMPRESSION: No radiographic evidence of acute cardiopulmonary disease. Electronically Signed    By: Vinnie Langton M.D.   On: 01/11/2020 19:58   ECHOCARDIOGRAM COMPLETE  Result Date: 01/13/2020    ECHOCARDIOGRAM REPORT   Patient Name:   WELCOME FULTS Tinnell Date of Exam: 01/13/2020 Medical Rec #:  [id number]        Height:       67.0 in Accession #:    [id number]       Weight:       277.3 lb Date of Birth:  02-24-1991        BSA:          2.324 m Patient Age:    28 years         BP:           141/117 mmHg Patient Gender: M                HR:           95 bpm. Exam Location:  [facility] Procedure: Intracardiac Opacification Agent Indications:    Leg swelling [[id number]  History:        Patient has no prior history of Echocardiogram examinations.                 Risk Factors:Current Smoker. ETOH, Leg Swelling.  Sonographer:    Leavy Cella RDCS (AE) Referring Phys: [place]  1. Left ventricular ejection fraction, by estimation, is approximately 40%. The left ventricle has moderately decreased function. The left ventricle demonstrates global hypokinesis with some regionla variation. There is moderate left ventricular hypertrophy. Left ventricular diastolic parameters are indeterminate in the setting of atrial fibrillation. No obvious LV mural thrombus.  2. Right ventricular systolic function is moderately reduced. The right ventricular size is normal. There is normal pulmonary artery systolic pressure. The estimated right ventricular systolic pressure is 40.8 mmHg.  3. The mitral valve is grossly normal. Mild mitral valve regurgitation.  4. The aortic valve is tricuspid. Aortic valve regurgitation is not visualized.  5. The inferior vena cava is dilated in size with <50% respiratory variability, suggesting right atrial pressure of 15 mmHg. FINDINGS  Left Ventricle: Left ventricular ejection fraction, by estimation, is 40%. The left ventricle has moderately decreased function. The left ventricle demonstrates global hypokinesis. Definity contrast agent was given IV to delineate the left  ventricular endocardial borders. The left ventricular internal cavity size was normal in size. There is moderate left ventricular hypertrophy. Left ventricular diastolic parameters are indeterminate. Right Ventricle: The right ventricular size is normal. No increase in right ventricular wall thickness. Right ventricular systolic function is moderately reduced. There is normal pulmonary artery systolic pressure. The tricuspid regurgitant velocity is 1.86 m/s, and with an assumed right atrial pressure of 15 mmHg, the estimated right ventricular systolic pressure is 14.4 mmHg. Left Atrium: Left atrial size was normal in size. Right Atrium: Right atrial size was normal in size. Pericardium: There is no evidence of pericardial effusion. Mitral Valve: The mitral valve is grossly normal. Mild mitral valve regurgitation. Tricuspid Valve: The tricuspid valve is grossly normal. Tricuspid valve regurgitation is trivial. Aortic Valve: The aortic valve is tricuspid. Aortic valve regurgitation is not visualized. Pulmonic Valve: The pulmonic valve was grossly normal. Pulmonic valve regurgitation is trivial. Aorta: The aortic  root is normal in size and structure. Venous: The inferior vena cava is dilated in size with less than 50% respiratory variability, suggesting right atrial pressure of 15 mmHg. IAS/Shunts: No atrial level shunt detected by color flow Doppler.  LEFT VENTRICLE PLAX 2D LVIDd:         3.70 cm  Diastology LVIDs:         2.93 cm  LV e' lateral:   12.90 cm/s LV PW:         1.53 cm  LV E/e' lateral: 8.9 LV IVS:        1.43 cm  LV e' medial:    8.49 cm/s LVOT diam:     2.10 cm  LV E/e' medial:  13.5 LVOT Area:     3.46 cm  RIGHT VENTRICLE RV S prime:     9.57 cm/s TAPSE (M-mode): 1.1 cm LEFT ATRIUM           Index       RIGHT ATRIUM           Index LA diam:      4.70 cm 2.02 cm/m  RA Area:     22.10 cm LA Vol (A2C): 52.7 ml 22.68 ml/m RA Volume:   72.10 ml  31.03 ml/m LA Vol (A4C): 49.8 ml 21.43 ml/m   AORTA Ao  Root diam: 2.70 cm MITRAL VALVE                TRICUSPID VALVE MV Area (PHT): 7.51 cm     TR Peak grad:   13.8 mmHg MV Decel Time: 101 msec     TR Vmax:        186.00 cm/s MV E velocity: 115.00 cm/s MV A velocity: 37.20 cm/s   SHUNTS MV E/A ratio:  3.09         Systemic Diam: 2.10 cm Rozann Lesches MD Electronically signed by Rozann Lesches MD Signature Date/Time: 01/13/2020/12:47:33 PM    Final    US Abdomen Limited RUQ  Result Date: 01/12/2020 CLINICAL DATA:  Elevated liver enzymes EXAM: ULTRASOUND ABDOMEN LIMITED RIGHT UPPER QUADRANT COMPARISON:  None. FINDINGS: Gallbladder: No gallstones or wall thickening visualized. There is no pericholecystic fluid. No sonographic Murphy sign noted by sonographer. Common bile duct: Diameter: 4 mm. No intrahepatic or extrahepatic biliary duct dilatation. Liver: No focal lesion identified. Liver echogenicity is increased diffusely. Portal vein is patent on color Doppler imaging with normal direction of blood flow towards the liver. Other: None. IMPRESSION: Diffuse increase in liver echogenicity, a finding indicative of hepatic steatosis, potentially with underlying parenchymal liver disease as well. No focal liver lesions are evident; it must be cautioned that the sensitivity of ultrasound for detection of focal liver lesions is diminished in this circumstance. Study otherwise unremarkable. Electronically Signed   By: Lowella Grip III M.D.   On: 01/12/2020 10:12        Scheduled Meds: . chlorhexidine gluconate (MEDLINE KIT)  15 mL Mouth Rinse BID  . Chlorhexidine Gluconate Cloth  6 each Topical Daily  . docusate  100 mg Oral BID  . fentaNYL (SUBLIMAZE) injection  50 mcg Intravenous Once  . folic acid  1 mg Oral Daily  . LORazepam  0-4 mg Intravenous Q6H   Followed by  . LORazepam  0-4 mg Intravenous Q12H  . mouth rinse  15 mL Mouth Rinse BID  . mouth rinse  15 mL Mouth Rinse 10 times per day  . metoprolol tartrate  5 mg Intravenous Q6H  .  multivitamin with minerals  1 tablet Oral Daily  . nicotine  21 mg Transdermal Daily  . pantoprazole (PROTONIX) IV  40 mg Intravenous Daily  . polyethylene glycol  17 g Oral Daily  . thiamine  100 mg Oral Daily   Or  . thiamine  100 mg Intravenous Daily   Continuous Infusions: . propofol    .  ceFAZolin (ANCEF) IV Stopped (01/13/20 1337)  . dexmedetomidine (PRECEDEX) IV infusion 1.5 mcg/kg/hr (01/13/20 1856)  . diltiazem (CARDIZEM) infusion 5 mg/hr (01/13/20 1856)  . fentaNYL infusion INTRAVENOUS    . lactated ringers 75 mL/hr at 01/13/20 1856  . propofol (DIPRIVAN) infusion       LOS: 2 days    Critical care procedure note Authorized and performed by: Kathie Dike Total critical care time: Approximately 35 minutes Due to high probability of clinically significant, life-threatening deterioration, the patient required my highest level of preparedness to intervene emergently and I personally spent this critical care time directly and personally managing the patient.  The critical care time included obtaining a history, examining the patient, pulse oximetry, ordering and review of studies, arranging urgent treatment with development of a management plan, evaluation of patient's response to treatment, frequent reassessment, discussions with other providers.  Critical care time was performed to assess and manage the high probability of imminent, life-threatening deterioration that could result in multiorgan failure.  It was exclusive of separate billable procedures and treating other patients and teaching time.  Please see MDM section and the rest of the of note for further information on patient assessment and treatment     Kathie Dike, MD [facility]   If 7PM-7AM, please contact night-coverage [email or web address]  01/13/2020, 7:27 PM

## 2020-01-13 NOTE — Progress Notes (Signed)
Patient remains combative, attempting to get out of bed, hallucinating and paranoid of staff. Patient placed in soft restraints for safety of patient and staff.

## 2020-01-13 NOTE — Anesthesia Procedure Notes (Signed)
Procedure Name: Intubation Date/Time: 01/13/2020 7:45 PM Performed by: Molli Barrows, MD Pre-anesthesia Checklist: Patient identified, Emergency Drugs available, Suction available and Patient being monitored Patient Re-evaluated:Patient Re-evaluated prior to induction Oxygen Delivery Method: Ambu bag Preoxygenation: Pre-oxygenation with 100% oxygen Induction Type: IV induction and Rapid sequence Laryngoscope Size: Glidescope Grade View: Grade I Tube type: Oral Tube size: 7.5 mm Number of attempts: 1 Airway Equipment and Method: Stylet Placement Confirmation: ETT inserted through vocal cords under direct vision,  positive ETCO2 and breath sounds checked- equal and bilateral Tube secured with: Tape Dental Injury: Teeth and Oropharynx as per pre-operative assessment

## 2020-01-13 NOTE — Progress Notes (Signed)
Per MD request, patient's bilateral leg restraints removed since patient is sedated and resting comfortably.

## 2020-01-13 NOTE — Progress Notes (Signed)
Patient very anxious at start of shift with tremors and diaphoresis, precedex drip started. Patient has also required IV ativan in combination with precedex to keep him calm. Patient reporting hallucinations and nausea. Patient continuously trying to get out of bed, call light, bed alarm on high sensitivity and fall socks in place. Patient did climb over bed rail approximately 0030 and fell onto floor as this RN was entering room to respond to bed alarm. Fall was witnessed, patient fell onto bottom, did not hit head, no injuries occurred. VSS. Patient assisted back into bed by all ICU staff. Patient continuing to attempt to get out of bed after fall, also pullinh at all lines and IV's, and saying he's going to "shoot" staff. MD aware of situation and patient's fall.

## 2020-01-14 ENCOUNTER — Inpatient Hospital Stay (HOSPITAL_COMMUNITY): Payer: Self-pay

## 2020-01-14 DIAGNOSIS — J9690 Respiratory failure, unspecified, unspecified whether with hypoxia or hypercapnia: Secondary | ICD-10-CM

## 2020-01-14 DIAGNOSIS — R7989 Other specified abnormal findings of blood chemistry: Secondary | ICD-10-CM

## 2020-01-14 DIAGNOSIS — K852 Alcohol induced acute pancreatitis without necrosis or infection: Secondary | ICD-10-CM

## 2020-01-14 DIAGNOSIS — R6520 Severe sepsis without septic shock: Secondary | ICD-10-CM

## 2020-01-14 DIAGNOSIS — L03116 Cellulitis of left lower limb: Secondary | ICD-10-CM

## 2020-01-14 DIAGNOSIS — K7010 Alcoholic hepatitis without ascites: Secondary | ICD-10-CM

## 2020-01-14 DIAGNOSIS — A419 Sepsis, unspecified organism: Secondary | ICD-10-CM

## 2020-01-14 DIAGNOSIS — J988 Other specified respiratory disorders: Secondary | ICD-10-CM

## 2020-01-14 DIAGNOSIS — F10231 Alcohol dependence with withdrawal delirium: Secondary | ICD-10-CM

## 2020-01-14 LAB — GLUCOSE, CAPILLARY
Glucose-Capillary: 92 mg/dL (ref 70–99)
Glucose-Capillary: 80 mg/dL (ref 70–99)
Glucose-Capillary: 66 mg/dL — ABNORMAL LOW (ref 70–99)
Glucose-Capillary: 93 mg/dL (ref 70–99)
Glucose-Capillary: 83 mg/dL (ref 70–99)

## 2020-01-14 LAB — BLOOD GAS, ARTERIAL
Acid-base deficit: 3.9 mmol/L — ABNORMAL HIGH (ref 0.0–2.0)
Bicarbonate: 21.6 mmol/L (ref 20.0–28.0)
FIO2: 70
O2 Saturation: 99.4 %
Patient temperature: 37
pCO2 arterial: 32.1 mmHg (ref 32.0–48.0)
pH, Arterial: 7.41 (ref 7.350–7.450)
pO2, Arterial: 220 mmHg — ABNORMAL HIGH (ref 83.0–108.0)

## 2020-01-14 LAB — MAGNESIUM: Magnesium: 1.6 mg/dL — ABNORMAL LOW (ref 1.7–2.4)

## 2020-01-14 LAB — COMPREHENSIVE METABOLIC PANEL
ALT: 69 U/L — ABNORMAL HIGH (ref 0–44)
AST: 164 U/L — ABNORMAL HIGH (ref 15–41)
Albumin: 2.9 g/dL — ABNORMAL LOW (ref 3.5–5.0)
Alkaline Phosphatase: 114 U/L (ref 38–126)
Anion gap: 15 (ref 5–15)
BUN: 11 mg/dL (ref 6–20)
CO2: 15 mmol/L — ABNORMAL LOW (ref 22–32)
Calcium: 7.9 mg/dL — ABNORMAL LOW (ref 8.9–10.3)
Chloride: 106 mmol/L (ref 98–111)
Creatinine, Ser: 0.84 mg/dL (ref 0.61–1.24)
GFR calc Af Amer: >60 mL/min (ref >60)
GFR calc non Af Amer: >60 mL/min (ref >60)
Glucose, Bld: 113 mg/dL — ABNORMAL HIGH (ref 70–99)
Potassium: 3.6 mmol/L (ref 3.5–5.1)
Sodium: 136 mmol/L (ref 135–145)
Total Bilirubin: 2.7 mg/dL — ABNORMAL HIGH (ref 0.3–1.2)
Total Protein: 5.9 g/dL — ABNORMAL LOW (ref 6.5–8.1)

## 2020-01-14 LAB — CBC
HCT: 37.2 % — ABNORMAL LOW (ref 39.0–52.0)
Hemoglobin: 12.2 g/dL — ABNORMAL LOW (ref 13.0–17.0)
MCH: 35.4 pg — ABNORMAL HIGH (ref 26.0–34.0)
MCHC: 32.8 g/dL (ref 30.0–36.0)
MCV: 107.8 fL — ABNORMAL HIGH (ref 80.0–100.0)
Platelets: 62 10*3/uL — ABNORMAL LOW (ref 150–400)
RBC: 3.45 MIL/uL — ABNORMAL LOW (ref 4.22–5.81)
RDW: 16.2 % — ABNORMAL HIGH (ref 11.5–15.5)
WBC: 8.5 10*3/uL (ref 4.0–10.5)
nRBC: 0 % (ref 0.0–0.2)

## 2020-01-14 LAB — PHOSPHORUS: Phosphorus: 3.2 mg/dL (ref 2.5–4.6)

## 2020-01-14 LAB — TRIGLYCERIDES: Triglycerides: 138 mg/dL (ref <150)

## 2020-01-14 LAB — LACTIC ACID, PLASMA: Lactic Acid, Venous: 1.8 mmol/L (ref 0.5–1.9)

## 2020-01-14 MED ORDER — PROSOURCE TF PO LIQD
45.0000 mL | Freq: Two times a day (BID) | ORAL | Status: DC
  Administered 2020-01-14: 45 mL
  Filled 2020-01-14: qty 45

## 2020-01-14 MED ORDER — DOCUSATE SODIUM 100 MG PO CAPS: 100.0000 mg | ORAL_CAPSULE | Freq: Two times a day (BID) | ORAL | Status: DC

## 2020-01-14 MED ORDER — KETOROLAC TROMETHAMINE 30 MG/ML IJ SOLN
30.0000 mg | Freq: Once | INTRAMUSCULAR | Status: AC
  Administered 2020-01-14: 30 mg via INTRAVENOUS
  Filled 2020-01-14: qty 1

## 2020-01-14 MED ORDER — DOCUSATE SODIUM 50 MG/5ML PO LIQD
100.0000 mg | Freq: Two times a day (BID) | ORAL | Status: DC
  Administered 2020-01-14 – 2020-01-16 (×4): 100 mg
  Filled 2020-01-14 (×7): qty 10

## 2020-01-14 MED ORDER — METOPROLOL TARTRATE 25 MG PO TABS
25.0000 mg | ORAL_TABLET | Freq: Two times a day (BID) | ORAL | Status: DC
  Administered 2020-01-14: 25 mg via ORAL
  Filled 2020-01-14 (×3): qty 1

## 2020-01-14 MED ORDER — SODIUM CHLORIDE 0.9 % IV BOLUS
1000.0000 mL | Freq: Once | INTRAVENOUS | Status: AC
  Administered 2020-01-14: 1000 mL via INTRAVENOUS

## 2020-01-14 MED ORDER — FOLIC ACID 5 MG/ML IJ SOLN
1.0000 mg | Freq: Every day | INTRAMUSCULAR | Status: DC
  Administered 2020-01-14: 1 mg via INTRAVENOUS
  Filled 2020-01-14: qty 0.2

## 2020-01-14 MED ORDER — VITAL HIGH PROTEIN PO LIQD
1000.0000 mL | ORAL | Status: DC
  Administered 2020-01-14 – 2020-01-15 (×3): 1000 mL

## 2020-01-14 MED ORDER — METOPROLOL TARTRATE 5 MG/5ML IV SOLN
5.0000 mg | Freq: Once | INTRAVENOUS | Status: AC
  Administered 2020-01-14: 5 mg via INTRAVENOUS
  Filled 2020-01-14: qty 5

## 2020-01-14 MED ORDER — PROSOURCE TF PO LIQD
90.0000 mL | Freq: Three times a day (TID) | ORAL | Status: DC
  Administered 2020-01-14 – 2020-01-16 (×5): 90 mL
  Filled 2020-01-14 (×6): qty 90

## 2020-01-14 MED ORDER — THIAMINE HCL 100 MG/ML IJ SOLN: 100.0000 mg | Freq: Every day | INTRAMUSCULAR | Status: DC

## 2020-01-14 MED ORDER — THIAMINE HCL 100 MG PO TABS
100.0000 mg | ORAL_TABLET | Freq: Every day | ORAL | Status: DC
  Administered 2020-01-15 – 2020-01-16 (×2): 100 mg
  Filled 2020-01-14 (×2): qty 1

## 2020-01-14 MED ORDER — ACETAMINOPHEN 325 MG PO TABS: 650.0000 mg | ORAL_TABLET | Freq: Four times a day (QID) | ORAL | Status: DC | PRN

## 2020-01-14 MED ORDER — PANTOPRAZOLE SODIUM 40 MG PO PACK
40.0000 mg | PACK | ORAL | Status: DC
  Administered 2020-01-14 – 2020-01-15 (×2): 40 mg
  Filled 2020-01-14 (×2): qty 20

## 2020-01-14 MED ORDER — SODIUM CHLORIDE 0.9 % IV SOLN: INTRAVENOUS | Status: DC

## 2020-01-14 MED ORDER — FOLIC ACID 1 MG PO TABS
1.0000 mg | ORAL_TABLET | Freq: Every day | ORAL | Status: DC
  Administered 2020-01-15 – 2020-01-16 (×2): 1 mg
  Filled 2020-01-14 (×2): qty 1

## 2020-01-14 NOTE — Progress Notes (Signed)
Dr. Camillo Flaming paged for lactic of 4.5 and temp greater than 102. Order for NS bolus, maintence NS at 120mL/HR, and Toradol. Repeat lactic at 5am this morning showed lactic at 1.5 and temp has resolved to 99.

## 2020-01-14 NOTE — Progress Notes (Signed)
Initial Nutrition Assessment  DOCUMENTATION CODES:      INTERVENTION:  -Vital High Protein @ 40 ml/hr via OGT     -Add 90 ml ProSource TF-TID per tube  Tube feeding with lipids from sedation provides 1897 kcal,  150 grams of protein, and 803 ml of H2O.    NUTRITION DIAGNOSIS:   Inadequate oral intake related to acute illness as evidenced by NPO status.   GOAL:  Provide needs based on ASPEN/SCCM guidelines   MONITOR:  Vent status, Labs, I & O's, Weight trends, TF tolerance    REASON FOR ASSESSMENT: Consult, Ventilator Enteral/tube feeding initiation and management  ASSESSMENT:  History of Hypertension and daily ETOH intake (beer or vodka. Cellulitis to lower extremities, weight gain. Acute CHF vs PNA, Alcohol withdrawal with delirium tremens and respiratory distress requiring intubation. Patient is currently intubated on ventilator support 8/19 @1945 .   MV: 12.1 L/min Temp (24hrs), Avg:99.8 F (37.7 C), Min:98.3 F (36.8 C), Max:101.4 F (38.6 C) MAP-89  Propofol: 26.4  ml/hr (697 kcal lipids q24 hr at current rate)  Weight history- March 117.9 kg . Pt reports usual 109 kg. Estimated needs based on pt reported usual dry weight. Edema on admission.    Medications reviewed and include: Precidex, Colace,  Folic Acid, Protonix  Labs reviewed:  BMP Latest Ref Rng & Units 01/14/2020 01/13/2020 01/12/2020  Glucose 70 - 99 mg/dL 01/14/2020) 384(Y) 659(D)  BUN 6 - 20 mg/dL 11 8 6   Creatinine 0.61 - 1.24 mg/dL 357(S 1.77  Sodium 135 - 145 mmol/L 136 136 138  Potassium 3.5 - 5.1 mmol/L 3.6 4.4 4.2  Chloride 98 - 111 mmol/L 106 102 108  CO2 22 - 32 mmol/L 15(L) 20(L) 19(L)  Calcium 8.9 - 10.3 mg/dL 7.9(L) 8.6(L) 8.1(L)     NUTRITION - FOCUSED PHYSICAL EXAM: deferred  Diet Order:   Diet Order            Diet NPO time specified  Diet effective now                 EDUCATION NEEDS:   Not appropriate for education at this time Skin:  Skin Assessment: Reviewed RN  Assessment  Last BM:  8/19  Height:   Ht Readings from Last 1 Encounters:  01/14/20 5\' 7"  (1.702 m)    Weight:   Wt Readings from Last 1 Encounters:  01/14/20 126.3 kg  EDW-109 kg  Ideal Body Weight:   67 kg  BMI:  Body mass index is 43.61 kg/m.  Estimated Nutritional Needs:   Kcal:  2392  Protein:  147-154  Fluid:  per MD goal  MS,RD,CSG,LDN Pager: #AMION

## 2020-01-14 NOTE — Progress Notes (Signed)
Patient's father requesting to be updated instead of patient's mother because "she is hysterical". A phone call was received from patient's girlfriend, Elmarie Shiley, and no information was given to her per request of father.

## 2020-01-14 NOTE — Progress Notes (Signed)
PROGRESS NOTE    Omar Mccarthy  OJJ:[id number] DOB: 12-18-1990 DOA: 01/11/2020 PCP: [facility] [place]    Brief Narrative:  HPI: Omar Mccarthy is a 29 y.o. male with medical history significant for hypertension.  Patient presented to the ED with complaints of bilateral lower extremity swelling for the past several months.  He also reports intermittent vomiting, difficulty breathing also intermittent. Patient was in the ED in March, after he left the ED, his swelling improved at that time, but then reoccurred again.  He reports abdominal bloating, he also reports weight gain, his baseline weight is about 240, but his weight was checked today at the health department, it was 273. He reports difficulty breathing, intermittent, stable and unchanged 2-3 pillow orthopnea.  He reports chest pain ongoing over several years, intermittent, not related to activity, described both as sharp, pressure-like and burning, and usually related to his alcohol withdrawal episodes.  No family history of heart disease in parents or siblings. Patient drinks 24-26 beers daily or drinks half a gallon of vodka daily.  His last drink was at about 4 PM, just prior to arrival in the ED, 6 hours ago. Patient reports about 2 weeks ago, he had several episodes of black stools/abnormal colored stools, this lasted for about 2 months.  No vomiting.  Reports occasionally seeing blood when he wipes after a bowel movement.  He has not received the Covid vaccine.  ED Course: Tachycardic up to 150s.  BNP elevated 279.  Blood alcohol level elevated at 364.  Ammonia 62.  Lipase 112.  Troponin X 24.  Hemoglobin 11.7 down from 14.65 months ago..  Left lower extremity venous Dopplers negative for DVT.  Portable chest x-ray without acute abnormality. 1 L bolus given in ED with improvement in patient's heart rate.  Hospitalist to admit for alcohol intoxication and volume overload.   Assessment & Plan:   Principal  Problem:   Fluid overload Active Problems:   Alcohol abuse with intoxication (HCC)   Leg swelling   1. Atrial fibrillation with rapid ventricular response. Echocardiogram shows ejection fraction of 40%.  CHA2DS2-VASc score of 1 at this time.  Does not appear to be a candidate for anticoagulation with low CHA2DS2-VASc score as well as ongoing issues with alcoholism. Initially required cardizem infusion and IV metoprolol. Cardizem has been weaned off. HR is stable. 2. Acute systolic CHF vs. PNA.  Patient initially presented with shortness of breath and concerns for volume overload. EF found to be 40% on echo. CTA chest negative for pulmonary embolism. Follow up chest xray shows pleural effusion with possible consolidation. Lactate was elevated, so he has not been receiving lasix. He has been febrile overnight and is on IV Ancef with cultures pending. 3. Elevated liver enzymes.  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  Viral hepatitis panel negative, abdominal ultrasound unrevealing.  Discriminant function 12.9, currently not a candidate for steroids.  Transaminases are trending down. 4. Thrombocytopenia.  Suspect this is related to alcohol use.  Continue to follow. 5. Cellulitis of left lower extremity.  Noted to be erythematous, warm to touch.  Currently on Ancef. 6. Sepsis. Patient noted to be febrile, tachycardic and tachypneic with elevated lactic acid at 4.5. Sources of infection include cellulitis vs. Possible pneumonia. Lactate has improved to 1.8 with IV fluids overnight, blood cultures in process. Hemodynamics are stabilizing. 7. Alcohol withdrawal with delirium tremens.  Patient started on Precedex for worsening alcohol withdrawal.  Despite maxing out on Precedex, he is required  restraints and intermittent doses of Ativan.  Despite these measures, patient remains tachypneic, tachycardic.  Respiratory rate in the 40s to 50s, heart rate in the 120s to 130s.  ABG shows normal pH.  By mental status,  he is significantly lethargic at this point.  It appears that he does need more sedation and would likely benefit from propofol.  Current measures do not appear to be adequate to control his withdrawal.  I am concerned that he may decompensate overnight without adequate sedation.  We will electively intubate patient and started on propofol to help manage delirium tremens. 8. Mechanical ventilation for severe delirium tremens and respiratory distress. PCCM consulted to assist with vent management. 9. Cardiomyopathy. EF of 40% on echocardiogram.  Possibly tachycardia induced from atrial fibrillation versus alcohol.  Discussed with Dr. Domenic Polite with recommendations to continue medical management on beta-blockers.  No plans for invasive cardiac work-up at this point.  He can be followed up by cardiology as an outpatient.   DVT prophylaxis: SCDs Start: 01/11/20 2316  Code Status: Full code Family Communication: updated patients mother, Diaz Crago [phone] Disposition Plan: Status is: Inpatient  Remains inpatient appropriate because:IV treatments appropriate due to intensity of illness or inability to take PO   Dispo: The patient is from: Home              Anticipated d/c is to: Home              Anticipated d/c date is: 2 days              Patient currently is not medically stable to d/c.   Consultants:   PCCM  Procedures:   Echo: EF 40% with global hypokinesis  Antimicrobials:   Ancef 8/18>   Subjective: Patient intubated overnight for respiratory distress with respiration in he 40s, tachycardia with HR in the 130s and persistent agitation, despite max dose precedex. Overnight, he was noted to have a fever and elevated lactic acid. He received fluid bolus with improvement of lactate. This morning he is sedated. He grimaces to tactile stimuli.   Objective: Vitals:   01/14/20 0600 01/14/20 0800 01/14/20 0900 01/14/20 0930  BP: 125/89 130/82 116/89 126/85  Pulse: 79 86 (!)  109 85  Resp: 20 19 (!) 21 19  Temp:      TempSrc:      SpO2: 100% 100% 100% 100%  Weight:      Height:        Intake/Output Summary (Last 24 hours) at 01/14/2020 1028 Last data filed at 01/14/2020 0803 Gross per 24 hour  Intake 3286.7 ml  Output 400 ml  Net 2886.7 ml   Filed Weights   01/12/20 0649 01/13/20 0500 01/14/20 0513  Weight: 125.3 kg 125.8 kg 126.3 kg    Examination:  General exam: intubated and sedated Respiratory system: Clear to auscultation. Respiratory effort normal. Cardiovascular system:irregular rate and rhythm. No murmurs, rubs, gallops. Gastrointestinal system: Abdomen is nondistended, soft and nontender. No organomegaly or masses felt. Normal bowel sounds heard. Central nervous system: sedated Extremities: No C/C/E, +pedal pulses Skin: erythema noted over left lower leg Psychiatry: sedated   Data Reviewed: I have personally reviewed following labs and imaging studies  CBC: Recent Labs  Lab 01/11/20 1914 01/11/20 1956 01/12/20 0339 01/13/20 0822 01/14/20 0505  WBC 4.5  --  4.1 6.0 8.5  NEUTROABS 3.1  --   --   --   --   HGB 11.7* 11.9* 10.8* 12.2* 12.2*  HCT 34.7* 35.0*  32.8* 36.7* 37.2*  MCV 105.2*  --  105.8* 106.1* 107.8*  PLT 96*  --  76* 65* 62*   Basic Metabolic Panel: Recent Labs  Lab 01/11/20 1914 01/11/20 1956 01/12/20 0339 01/13/20 0822 01/14/20 0505  NA 139 140 138 136 136  K 4.3 4.5 4.2 4.4 3.6  CL 106 105 108 102 106  CO2 18*  --  19* 20* 15*  GLUCOSE 120* 113* 119* 160* 113*  BUN 5* 3* _0 CREATININE 0.60* 1.20 0.61 0.90 0.84  CALCIUM 8.3*  --  8.1* 8.6* 7.9*  MG 2.0  --   --   --   --   PHOS 4.4  --   --   --   --    GFR: Estimated Creatinine Clearance: 165.5 mL/min (by C-G formula based on SCr of 0.84 mg/dL). Liver Function Tests: Recent Labs  Lab 01/11/20 1914 01/12/20 0339 01/13/20 0822 01/14/20 0505  AST 203* 209* 114* 164*  ALT 102* 94* 78* 69*  ALKPHOS 138* 157* 151* 114  BILITOT 1.4* 1.2 2.8*  2.7*  PROT 7.1 6.6 6.9 5.9*  ALBUMIN 3.6 3.3* 3.4* 2.9*   Recent Labs  Lab 01/11/20 1914 01/12/20 0339 01/13/20 1217  LIPASE 112* 117* 40   Recent Labs  Lab 01/11/20 1914  AMMONIA 62*   Coagulation Profile: Recent Labs  Lab 01/11/20 1914 01/13/20 0822  INR 1.2 1.3*   Cardiac Enzymes: No results for input(s): CKTOTAL, CKMB, CKMBINDEX, TROPONINI in the last 168 hours. BNP (last 3 results) No results for input(s): PROBNP in the last 8760 hours. HbA1C: No results for input(s): HGBA1C in the last 72 hours. CBG: Recent Labs  Lab 01/14/20 0720  GLUCAP 12   Lipid Profile: Recent Labs    01/14/20 0505  TRIG 138   Thyroid Function Tests: Recent Labs    01/11/20 1914  TSH 3.890   Anemia Panel: Recent Labs    01/12/20 0339  VITAMINB12 478  FOLATE 9.2  FERRITIN 807*  TIBC 317  IRON 83  RETICCTPCT 3.0   Sepsis Labs: Recent Labs  Lab 01/11/20 2112 01/12/20 0339 01/13/20 1932 01/14/20 0505  LATICACIDVEN 3.2* 2.0* 4.5* 1.8    Recent Results (from the past 240 hour(s))  SARS Coronavirus 2 by RT PCR (hospital order, performed in [place] hospital lab) Nasopharyngeal Nasopharyngeal Swab     Status: None   Collection Time: 01/11/20  7:01 PM   Specimen: Nasopharyngeal Swab  Result Value Ref Range Status   SARS Coronavirus 2 NEGATIVE NEGATIVE Final    Comment: (NOTE) SARS-CoV-2 target nucleic acids are NOT DETECTED.  The SARS-CoV-2 RNA is generally detectable in upper and lower respiratory specimens during the acute phase of infection. The lowest concentration of SARS-CoV-2 viral copies this assay can detect is 250 copies / mL. A negative result does not preclude SARS-CoV-2 infection and should not be used as the sole basis for treatment or other patient management decisions.  A negative result may occur with improper specimen collection / handling, submission of specimen other than nasopharyngeal swab, presence of viral mutation(s) within the areas  targeted by this assay, and inadequate number of viral copies (<250 copies / mL). A negative result must be combined with clinical observations, patient history, and epidemiological information.  Fact Sheet for Patients:   StrictlyIdeas.no  Fact Sheet for Healthcare Providers: BankingDealers.co.za  This test is not yet approved or  cleared by the [place] FDA and has been authorized for detection and/or diagnosis  of SARS-CoV-2 by FDA under an Emergency Use Authorization (EUA).  This EUA will remain in effect (meaning this test can be used) for the duration of the COVID-19 declaration under Section 564(b)(1) of the Act, 21 U.S.C. section 360bbb-3(b)(1), unless the authorization is terminated or revoked sooner.  Performed at [facility], [address] [place], [place] [address]   Culture, blood (routine x 2)     Status: None (Preliminary result)   Collection Time: 01/11/20  7:14 PM   Specimen: BLOOD RIGHT ARM  Result Value Ref Range Status   Specimen Description BLOOD RIGHT ARM  Final   Special Requests   Final    BOTTLES DRAWN AEROBIC AND ANAEROBIC Blood Culture adequate volume   Culture   Final    NO GROWTH 3 DAYS Performed at [facility], [address], [place], [place] [address]    Report Status PENDING  Incomplete  Culture, blood (routine x 2)     Status: Abnormal   Collection Time: 01/11/20  7:24 PM   Specimen: BLOOD RIGHT FOREARM  Result Value Ref Range Status   Specimen Description   Final    BLOOD RIGHT FOREARM Performed at [facility], [address] [place], [place] [address]    Special Requests   Final    BOTTLES DRAWN AEROBIC AND ANAEROBIC Blood Culture adequate volume Performed at [facility], [address] [place], [place] [address]    Culture  Setup Time   Final    GRAM POSITIVE COCCI aerobic bottle Gram Stain Report Called to,Read Back By and Verified With: HILTON, L@ [phone] by MATTHEWS, B  8.18.21 [place] [facility] Organism ID to follow CRITICAL RESULT CALLED TO, READ BACK BY AND VERIFIED WITH: H TETREAULT RN 01/12/20 2254 JDW    Culture (A)  Final    STAPHYLOCOCCUS HOMINIS THE SIGNIFICANCE OF ISOLATING THIS ORGANISM FROM A SINGLE SET OF BLOOD CULTURES WHEN MULTIPLE SETS ARE DRAWN IS UNCERTAIN. PLEASE NOTIFY THE MICROBIOLOGY DEPARTMENT WITHIN ONE WEEK IF SPECIATION AND SENSITIVITIES ARE REQUIRED. Performed at [facility], [place] [address], [place], [place] [address]    Report Status 01/13/2020 FINAL  Final  Blood Culture ID Panel (Reflexed)     Status: Abnormal   Collection Time: 01/11/20  7:24 PM  Result Value Ref Range Status   Enterococcus faecalis NOT DETECTED NOT DETECTED Final   Enterococcus Faecium NOT DETECTED NOT DETECTED Final   Listeria monocytogenes NOT DETECTED NOT DETECTED Final   Staphylococcus species DETECTED (A) NOT DETECTED Final    Comment: CRITICAL RESULT CALLED TO, READ BACK BY AND VERIFIED WITH: H TETREAULT RN 01/12/20 2254 JDW    Staphylococcus aureus (BCID) NOT DETECTED NOT DETECTED Final   Staphylococcus epidermidis NOT DETECTED NOT DETECTED Final   Staphylococcus lugdunensis NOT DETECTED NOT DETECTED Final   Streptococcus species NOT DETECTED NOT DETECTED Final   Streptococcus agalactiae NOT DETECTED NOT DETECTED Final   Streptococcus pneumoniae NOT DETECTED NOT DETECTED Final   Streptococcus pyogenes NOT DETECTED NOT DETECTED Final   A.calcoaceticus-baumannii NOT DETECTED NOT DETECTED Final   Bacteroides fragilis NOT DETECTED NOT DETECTED Final   Enterobacterales NOT DETECTED NOT DETECTED Final   Enterobacter cloacae complex NOT DETECTED NOT DETECTED Final   Escherichia coli NOT DETECTED NOT DETECTED Final   Klebsiella aerogenes NOT DETECTED NOT DETECTED Final   Klebsiella oxytoca NOT DETECTED NOT DETECTED Final   Klebsiella pneumoniae NOT DETECTED NOT DETECTED Final   Proteus species NOT DETECTED NOT DETECTED Final   Salmonella species  NOT DETECTED NOT DETECTED Final   Serratia marcescens NOT DETECTED NOT DETECTED Final   Haemophilus influenzae NOT DETECTED NOT DETECTED Final   Neisseria meningitidis NOT DETECTED NOT DETECTED Final   Pseudomonas aeruginosa NOT DETECTED NOT DETECTED Final   Stenotrophomonas maltophilia NOT DETECTED NOT DETECTED Final   Candida albicans NOT DETECTED NOT DETECTED Final   Candida auris NOT DETECTED NOT DETECTED Final   Candida glabrata NOT DETECTED NOT DETECTED Final   Candida krusei NOT DETECTED NOT DETECTED Final   Candida parapsilosis NOT DETECTED NOT DETECTED Final   Candida tropicalis NOT DETECTED NOT DETECTED Final   Cryptococcus neoformans/gattii NOT DETECTED NOT DETECTED Final    Comment: Performed at [facility], [place] [address], [place], [place] [address]  MRSA PCR Screening     Status: None   Collection Time: 01/12/20  6:45 AM   Specimen: Nasal Mucosa; Nasopharyngeal  Result Value Ref Range Status   MRSA by PCR NEGATIVE NEGATIVE Final    Comment:        The GeneXpert MRSA Assay (FDA approved for NASAL specimens only), is one component of a comprehensive MRSA colonization surveillance program. It is not intended to diagnose MRSA infection nor to guide or monitor treatment for MRSA infections. Performed at [facility], [address], [place], [place] [address]   Culture, blood (routine x 2)     Status: None (Preliminary result)   Collection Time: 01/13/20 12:16 PM   Specimen: BLOOD  Result Value Ref Range Status   Specimen Description BLOOD LEFT ANTECUBITAL  Final   Special Requests   Final    BOTTLES DRAWN AEROBIC AND ANAEROBIC Blood Culture adequate volume   Culture   Final    NO GROWTH < 24 HOURS Performed at [facility], [address] [place], [place] [address]    Report Status PENDING  Incomplete  Culture, blood (routine x 2)     Status: None (Preliminary result)   Collection Time: 01/13/20 12:16 PM   Specimen: BLOOD  Result Value Ref Range Status    Specimen Description BLOOD LEFT HAND  Final   Special Requests   Final    BOTTLES DRAWN AEROBIC AND ANAEROBIC Blood Culture adequate volume   Culture   Final    NO GROWTH < 24 HOURS Performed at [facility], [address] [place],  [address]    Report Status PENDING  Incomplete         Radiology Studies: CT ANGIO CHEST PE W OR WO CONTRAST  Result Date: 01/12/2020 CLINICAL DATA:  Bilateral lower extremity swelling, hypertension, vomiting and difficulty breathing since March, 2021. EXAM: CT ANGIOGRAPHY CHEST WITH CONTRAST TECHNIQUE: Multidetector CT imaging of the chest was performed using the standard protocol during bolus administration of intravenous contrast. Multiplanar CT image reconstructions and MIPs were obtained to evaluate the vascular anatomy. CONTRAST:  100 mL OMNIPAQUE IOHEXOL 350 MG/ML SOLN COMPARISON:  PA and lateral chest today and single-view of the chest 08/04/2019. FINDINGS: Cardiovascular: Satisfactory opacification of the pulmonary arteries to the segmental level. No evidence of pulmonary embolism. Normal heart size. There is cardiomegaly. No pericardial effusion. Mediastinum/Nodes: No enlarged mediastinal, hilar, or axillary lymph nodes. Thyroid gland, trachea, and esophagus demonstrate no significant findings. Lungs/Pleura: Trace pleural effusions. Mild dependent atelectasis is present. Upper Abdomen: There is marked, diffuse fatty infiltration of the visualized liver. Mild stranding is seen about the pancreas. Musculoskeletal: No acute or focal abnormality. Review of the MIP images confirms the above findings. IMPRESSION: Negative for  pulmonary embolus. Trace pleural effusions. Severe fatty infiltration of the liver. Mild stranding about the visualized pancreas may be due to pancreatitis. Cardiomegaly. Electronically Signed   By: Inge Rise M.D.   On: 01/12/2020 12:57   Portable Chest xray  Result Date: 01/14/2020 CLINICAL DATA:  Hypoxia EXAM: PORTABLE CHEST  1 VIEW COMPARISON:  January 13, 2020. FINDINGS: Endotracheal tube tip is 5.4 cm above the carina. Nasogastric tube tip and side port are below the diaphragm. No pneumothorax. There are pleural effusions bilaterally which appear to be partially layering. There is consolidation in both lung bases. No new opacity evident. There is cardiomegaly with a degree of pulmonary venous hypertension. No adenopathy evident. No bone lesions. IMPRESSION: Tube positions as described without pneumothorax. Cardiomegaly with pulmonary vascular congestion and pleural effusion. Suspect a degree of congestive heart failure. Airspace consolidation is noted in both lung bases, primarily medially. Suspect combination of atelectasis and pneumonia in these areas. A degree of alveolar edema may also be present in these areas. The appearance of the lungs is similar to 1 day prior. Electronically Signed   By: Lowella Grip III M.D.   On: 01/14/2020 08:07   DG CHEST PORT 1 VIEW  Result Date: 01/13/2020 CLINICAL DATA:  Post intubation EXAM: PORTABLE CHEST 1 VIEW COMPARISON:  01/11/2020, CT 01/12/2020 FINDINGS: Cardiomegaly with vascular congestion and hazy perihilar opacities suspicious for mild pulmonary edema. At least small bilateral effusions. Endotracheal tube tip is about 4.3 cm superior to carina. Esophageal tube tip is below the diaphragm but is incompletely visualized. IMPRESSION: 1. Cardiomegaly with vascular congestion and hazy perihilar opacities suspicious for mild pulmonary edema. At least small bilateral effusions. 2. Endotracheal tube tip about 4.3 cm superior to carina. Electronically Signed   By: Donavan Foil M.D.   On: 01/13/2020 20:17   ECHOCARDIOGRAM COMPLETE  Result Date: 01/13/2020    ECHOCARDIOGRAM REPORT   Patient Name:   TABOR DENHAM [place] Date of Exam: 01/13/2020 Medical Rec #:  [id number]        Height:       67.0 in Accession #:    [id number]       Weight:       277.3 lb Date of Birth:  1990-07-29        BSA:           2.324 m Patient Age:    28 years         BP:           141/117 mmHg Patient Gender: M                HR:           95 bpm. Exam Location:  [facility] Procedure: Intracardiac Opacification Agent Indications:    Leg swelling [[id number]  History:        Patient has no prior history of Echocardiogram examinations.                 Risk Factors:Current Smoker. ETOH, Leg Swelling.  Sonographer:    Leavy Cella RDCS (AE) Referring Phys: [place]  1. Left ventricular ejection fraction, by estimation, is approximately 40%. The left ventricle has moderately decreased function. The left ventricle demonstrates global hypokinesis with some regionla variation. There is moderate left ventricular hypertrophy. Left ventricular diastolic parameters are indeterminate in the setting of atrial fibrillation. No obvious LV mural thrombus.  2. Right ventricular systolic function is moderately reduced. The right ventricular size is normal. There  is normal pulmonary artery systolic pressure. The estimated right ventricular systolic pressure is 61.9 mmHg.  3. The mitral valve is grossly normal. Mild mitral valve regurgitation.  4. The aortic valve is tricuspid. Aortic valve regurgitation is not visualized.  5. The inferior vena cava is dilated in size with <50% respiratory variability, suggesting right atrial pressure of 15 mmHg. FINDINGS  Left Ventricle: Left ventricular ejection fraction, by estimation, is 40%. The left ventricle has moderately decreased function. The left ventricle demonstrates global hypokinesis. Definity contrast agent was given IV to delineate the left ventricular endocardial borders. The left ventricular internal cavity size was normal in size. There is moderate left ventricular hypertrophy. Left ventricular diastolic parameters are indeterminate. Right Ventricle: The right ventricular size is normal. No increase in right ventricular wall thickness. Right ventricular systolic function  is moderately reduced. There is normal pulmonary artery systolic pressure. The tricuspid regurgitant velocity is 1.86 m/s, and with an assumed right atrial pressure of 15 mmHg, the estimated right ventricular systolic pressure is 50.9 mmHg. Left Atrium: Left atrial size was normal in size. Right Atrium: Right atrial size was normal in size. Pericardium: There is no evidence of pericardial effusion. Mitral Valve: The mitral valve is grossly normal. Mild mitral valve regurgitation. Tricuspid Valve: The tricuspid valve is grossly normal. Tricuspid valve regurgitation is trivial. Aortic Valve: The aortic valve is tricuspid. Aortic valve regurgitation is not visualized. Pulmonic Valve: The pulmonic valve was grossly normal. Pulmonic valve regurgitation is trivial. Aorta: The aortic root is normal in size and structure. Venous: The inferior vena cava is dilated in size with less than 50% respiratory variability, suggesting right atrial pressure of 15 mmHg. IAS/Shunts: No atrial level shunt detected by color flow Doppler.  LEFT VENTRICLE PLAX 2D LVIDd:         3.70 cm  Diastology LVIDs:         2.93 cm  LV e' lateral:   12.90 cm/s LV PW:         1.53 cm  LV E/e' lateral: 8.9 LV IVS:        1.43 cm  LV e' medial:    8.49 cm/s LVOT diam:     2.10 cm  LV E/e' medial:  13.5 LVOT Area:     3.46 cm  RIGHT VENTRICLE RV S prime:     9.57 cm/s TAPSE (M-mode): 1.1 cm LEFT ATRIUM           Index       RIGHT ATRIUM           Index LA diam:      4.70 cm 2.02 cm/m  RA Area:     22.10 cm LA Vol (A2C): 52.7 ml 22.68 ml/m RA Volume:   72.10 ml  31.03 ml/m LA Vol (A4C): 49.8 ml 21.43 ml/m   AORTA Ao Root diam: 2.70 cm MITRAL VALVE                TRICUSPID VALVE MV Area (PHT): 7.51 cm     TR Peak grad:   13.8 mmHg MV Decel Time: 101 msec     TR Vmax:        186.00 cm/s MV E velocity: 115.00 cm/s MV A velocity: 37.20 cm/s   SHUNTS MV E/A ratio:  3.09         Systemic Diam: 2.10 cm Rozann Lesches MD Electronically signed by Rozann Lesches MD Signature Date/Time: 01/13/2020/12:47:33 PM    Final  Scheduled Meds: . chlorhexidine gluconate (MEDLINE KIT)  15 mL Mouth Rinse BID  . Chlorhexidine Gluconate Cloth  6 each Topical Daily  . docusate  100 mg Per Tube BID  . folic acid  1 mg Intravenous Daily  . mouth rinse  15 mL Mouth Rinse BID  . mouth rinse  15 mL Mouth Rinse 10 times per day  . metoprolol tartrate  5 mg Intravenous Q6H  . multivitamin with minerals  1 tablet Oral Daily  . nicotine  21 mg Transdermal Daily  . pantoprazole (PROTONIX) IV  40 mg Intravenous Daily  . polyethylene glycol  17 g Oral Daily  . thiamine  100 mg Oral Daily   Or  . thiamine  100 mg Intravenous Daily   Continuous Infusions: .  ceFAZolin (ANCEF) IV Stopped (01/14/20 0459)  . fentaNYL infusion INTRAVENOUS 100 mcg/hr (01/14/20 0803)  . propofol (DIPRIVAN) infusion 35 mcg/kg/min (01/14/20 0932)     LOS: 3 days    Critical care procedure note Authorized and performed by: Kathie Dike Total critical care time: Approximately 35 minutes Due to high probability of clinically significant, life-threatening deterioration, the patient required my highest level of preparedness to intervene emergently and I personally spent this critical care time directly and personally managing the patient.  The critical care time included obtaining a history, examining the patient, pulse oximetry, ordering and review of studies, arranging urgent treatment with development of a management plan, evaluation of patient's response to treatment, frequent reassessment, discussions with other providers.  Critical care time was performed to assess and manage the high probability of imminent, life-threatening deterioration that could result in multiorgan failure.  It was exclusive of separate billable procedures and treating other patients and teaching time.  Please see MDM section and the rest of the of note for further information on patient assessment and  treatment     Kathie Dike, MD [facility]   If 7PM-7AM, please contact night-coverage [email or web address]  01/14/2020, 10:28 AM

## 2020-01-14 NOTE — Consult Note (Signed)
NAME:  Omar Mccarthy, MRN:  [id number], DOB:  1991/04/20, LOS: 3 ADMISSION DATE:  01/11/2020, CONSULTATION DATE:  01/14/2020 REFERRING MD:  Dr. Kerry Hough, [facility], CHIEF COMPLAINT:  Respiratory failure   Brief History   29 yo male smoker with hx of ETOH (drinks 24 beers or 1/2 gallon vodka per day) presented to ER with leg swelling and redness.  Also had vomiting and dyspnea.  Admitted with alcohol intoxication, CHF exacerbation, lactic acidosis, elevated LFTs, A fib with RVR, and cellulitis of Lt leg.  Developed delirium tremens on 8/19 and required IV sedation and intubation for airway support.     Hx from chart and medical staff.  Past Medical History  ETOH abuse, HTN  Significant Hospital Events   8/17 Admit 8/18 A fib with RVR 8/19 DT's, intubated  Consults:    Procedures:  ETT 8/19 >>  Significant Diagnostic Tests:   Doppler Lt leg 8/17 >> no DVT  RUQ ultrasound 8/18 >> hepatic steatosis  CT angio chest 8/18 >> cardiomegaly, trace effusions, mild ATX, fatty liver, mild stranding in pancreas  Echo 8/19 >> EF 40%, mod LVH, reduced RV systolic fx, RVSP 28.8 mmHg, mild MR  Micro Data:  COVID 8/17 >> negative Blood 8/17 >> Staph hominis MRSA PCR 8/18 >> negative Blood 8/19 >> Blood 8/20 >>   Antimicrobials:  Ancef 8/18 >>   Interim history/subjective:    Objective   Blood pressure 120/88, pulse 94, temperature 98.3 F (36.8 C), temperature source Axillary, resp. rate 20, height 5\' 7"  (1.702 m), weight 126.3 kg, SpO2 100 %.    Vent Mode: PRVC FiO2 (%):  [40 %-100 %] 40 % Set Rate:  [20 bmp-28 bmp] 20 bmp Vt Set:  [530 mL] 530 mL PEEP:  [5 cmH20] 5 cmH20 Plateau Pressure:  [16 cmH20-23 cmH20] 16 cmH20   Intake/Output Summary (Last 24 hours) at 01/14/2020 1349 Last data filed at 01/14/2020 1216 Gross per 24 hour  Intake 3964.57 ml  Output 400 ml  Net 3564.57 ml   Filed Weights   01/12/20 0649 01/13/20 0500 01/14/20 0513  Weight: 125.3 kg 125.8 kg 126.3 kg     Examination:  General - sedated Eyes - pupils pinpoint and reactive ENT - ETT in place Cardiac - regular rate/rhythm, no murmur Chest - equal breath sounds b/l, no wheezing or rales Abdomen - soft, non tender, + bowel sounds Extremities - 1+ edema Skin - erythema of lower legs Lt > Rt Neuro - RASS -2   Resolved Hospital Problem list   Lactic acidosis  Assessment & Plan:   Acute hypoxic respiratory failure with compromised airway in setting of delirium tremens. Tobacco abuse. - continue full vent support until mental status improved - f/u CXR - goal SpO2 > 92% - nicotine patch  Delirium tremens. - continue diprivian, fentanyl for goal RASS -1 - thiamine, folic acid, MVI  Alcoholic hepatitis, mild. Acute alcoholic pancreatitis, mild. - f/u LFTs; don't think he needs steroids at this time - start tube feeds  Lt leg cellulitis. - uncertain whether Staph hominis in blood culture is contaminate versus true infection - continue day 3 of ancef - f/u blood culture results  New onset A fib, back in sinus rhythm. 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 - continue lopressor  Thrombocytopenia. - likely from alcohol abuse - f/u CBC  Best practice:  Diet: tube feeds DVT prophylaxis: SCDs GI prophylaxis: Protonix Mobility: Bed rest Code Status: full code Disposition: ICU  Labs  CBC: Recent Labs  Lab 01/11/20 1914 01/11/20 1956 01/12/20 0339 01/13/20 0822 01/14/20 0505  WBC 4.5  --  4.1 6.0 8.5  NEUTROABS 3.1  --   --   --   --   HGB 11.7* 11.9* 10.8* 12.2* 12.2*  HCT 34.7* 35.0* 32.8* 36.7* 37.2*  MCV 105.2*  --  105.8* 106.1* 107.8*  PLT 96*  --  76* 65* 62*    Basic Metabolic Panel: Recent Labs  Lab 01/11/20 1914 01/11/20 1956 01/12/20 0339 01/13/20 0822 01/14/20 0505  NA 139 140 138 136 136  K 4.3 4.5 4.2 4.4 3.6  CL 106 105 108 102 106  CO2 18*  --  19* 20* 15*  GLUCOSE 120* 113* 119* 160* 113*  BUN 5* 3* 6 8 11    CREATININE 0.60* 1.20 0.61 0.90 0.84  CALCIUM 8.3*  --  8.1* 8.6* 7.9*  MG 2.0  --   --   --   --   PHOS 4.4  --   --   --   --    GFR: Estimated Creatinine Clearance: 165.5 mL/min (by C-G formula based on SCr of 0.84 mg/dL). Recent Labs  Lab 01/11/20 1914 01/11/20 1914 01/11/20 2112 01/12/20 0339 01/13/20 0822 01/13/20 1932 01/14/20 0505  WBC 4.5  --   --  4.1 6.0  --  8.5  LATICACIDVEN 2.2*   < > 3.2* 2.0*  --  4.5* 1.8   < > = values in this interval not displayed.    Liver Function Tests: Recent Labs  Lab 01/11/20 1914 01/12/20 0339 01/13/20 0822 01/14/20 0505  AST 203* 209* 114* 164*  ALT 102* 94* 78* 69*  ALKPHOS 138* 157* 151* 114  BILITOT 1.4* 1.2 2.8* 2.7*  PROT 7.1 6.6 6.9 5.9*  ALBUMIN 3.6 3.3* 3.4* 2.9*   Recent Labs  Lab 01/11/20 1914 01/12/20 0339 01/13/20 1217  LIPASE 112* 117* 40   Recent Labs  Lab 01/11/20 1914  AMMONIA 62*    ABG    Component Value Date/Time   PHART 7.410 01/14/2020 0520   PCO2ART 32.1 01/14/2020 0520   PO2ART 220 (H) 01/14/2020 0520   HCO3 21.6 01/14/2020 0520   TCO2 20 (L) 01/11/2020 1956   ACIDBASEDEF 3.9 (H) 01/14/2020 0520   O2SAT 99.4 01/14/2020 0520     Coagulation Profile: Recent Labs  Lab 01/11/20 1914 01/13/20 0822  INR 1.2 1.3*    Cardiac Enzymes: No results for input(s): CKTOTAL, CKMB, CKMBINDEX, TROPONINI in the last 168 hours.  HbA1C: No results found for: HGBA1C  CBG: Recent Labs  Lab 01/14/20 0720 01/14/20 1142  GLUCAP 92 80    Review of Systems:   Unable to obtain  Past Medical History  He,  has a past medical history of Hypertension.   Surgical History   History reviewed. No pertinent surgical history.   Social History   reports that he has been smoking. He has never used smokeless tobacco. He reports that he does not use drugs.   Family History   His family history is not on file.   Allergies No Known Allergies   Home Medications  Prior to Admission medications    Not on File     Critical care time: 34 minutes  01/16/20, MD [facility] Pulmonary/Critical Care Pager - [phone] 01/14/2020, 2:05 PM

## 2020-01-14 NOTE — Progress Notes (Addendum)
CRITICAL VALUE ALERT  Critical Value:  Lactic 4.5  Date & Time Notied:  2100 01/13/20  Provider Notified: Erling Conte MD   Orders Received/Actions taken: None

## 2020-01-15 ENCOUNTER — Inpatient Hospital Stay: Payer: Self-pay

## 2020-01-15 ENCOUNTER — Inpatient Hospital Stay (HOSPITAL_COMMUNITY): Payer: Self-pay

## 2020-01-15 DIAGNOSIS — I429 Cardiomyopathy, unspecified: Secondary | ICD-10-CM

## 2020-01-15 DIAGNOSIS — J9690 Respiratory failure, unspecified, unspecified whether with hypoxia or hypercapnia: Secondary | ICD-10-CM

## 2020-01-15 DIAGNOSIS — Z789 Other specified health status: Secondary | ICD-10-CM

## 2020-01-15 DIAGNOSIS — J9601 Acute respiratory failure with hypoxia: Secondary | ICD-10-CM

## 2020-01-15 LAB — GLUCOSE, CAPILLARY
Glucose-Capillary: 109 mg/dL — ABNORMAL HIGH (ref 70–99)
Glucose-Capillary: 110 mg/dL — ABNORMAL HIGH (ref 70–99)
Glucose-Capillary: 116 mg/dL — ABNORMAL HIGH (ref 70–99)
Glucose-Capillary: 80 mg/dL (ref 70–99)
Glucose-Capillary: 93 mg/dL (ref 70–99)
Glucose-Capillary: 98 mg/dL (ref 70–99)

## 2020-01-15 LAB — CBC
HCT: 35 % — ABNORMAL LOW (ref 39.0–52.0)
Hemoglobin: 11.4 g/dL — ABNORMAL LOW (ref 13.0–17.0)
MCH: 35.2 pg — ABNORMAL HIGH (ref 26.0–34.0)
MCHC: 32.6 g/dL (ref 30.0–36.0)
MCV: 108 fL — ABNORMAL HIGH (ref 80.0–100.0)
Platelets: 64 10*3/uL — ABNORMAL LOW (ref 150–400)
RBC: 3.24 MIL/uL — ABNORMAL LOW (ref 4.22–5.81)
RDW: 16.1 % — ABNORMAL HIGH (ref 11.5–15.5)
WBC: 8.1 10*3/uL (ref 4.0–10.5)
nRBC: 0 % (ref 0.0–0.2)

## 2020-01-15 LAB — COOXEMETRY PANEL
Carboxyhemoglobin: 1.8 % — ABNORMAL HIGH (ref 0.5–1.5)
Carboxyhemoglobin: 2.2 % — ABNORMAL HIGH (ref 0.5–1.5)
Methemoglobin: 0.8 % (ref 0.0–1.5)
Methemoglobin: 0.7 % (ref 0.0–1.5)
O2 Saturation: 94.7 %
O2 Saturation: 76.7 %
Total hemoglobin: 11.8 g/dL — ABNORMAL LOW (ref 12.0–16.0)
Total hemoglobin: 11.4 g/dL — ABNORMAL LOW (ref 12.0–16.0)

## 2020-01-15 LAB — COMPREHENSIVE METABOLIC PANEL
ALT: 62 U/L — ABNORMAL HIGH (ref 0–44)
AST: 152 U/L — ABNORMAL HIGH (ref 15–41)
Albumin: 2.4 g/dL — ABNORMAL LOW (ref 3.5–5.0)
Alkaline Phosphatase: 112 U/L (ref 38–126)
Anion gap: 9 (ref 5–15)
BUN: 13 mg/dL (ref 6–20)
CO2: 21 mmol/L — ABNORMAL LOW (ref 22–32)
Calcium: 7.7 mg/dL — ABNORMAL LOW (ref 8.9–10.3)
Chloride: 106 mmol/L (ref 98–111)
Creatinine, Ser: 0.55 mg/dL — ABNORMAL LOW (ref 0.61–1.24)
GFR calc Af Amer: >60 mL/min (ref >60)
GFR calc non Af Amer: >60 mL/min (ref >60)
Glucose, Bld: 84 mg/dL (ref 70–99)
Potassium: 3.5 mmol/L (ref 3.5–5.1)
Sodium: 136 mmol/L (ref 135–145)
Total Bilirubin: 3 mg/dL — ABNORMAL HIGH (ref 0.3–1.2)
Total Protein: 5.5 g/dL — ABNORMAL LOW (ref 6.5–8.1)

## 2020-01-15 LAB — PHOSPHORUS
Phosphorus: 3.2 mg/dL (ref 2.5–4.6)
Phosphorus: 3.4 mg/dL (ref 2.5–4.6)

## 2020-01-15 LAB — MAGNESIUM
Magnesium: 1.7 mg/dL (ref 1.7–2.4)
Magnesium: 1.7 mg/dL (ref 1.7–2.4)

## 2020-01-15 LAB — TRIGLYCERIDES: Triglycerides: 109 mg/dL (ref <150)

## 2020-01-15 MED ORDER — METOPROLOL TARTRATE 25 MG PO TABS
25.0000 mg | ORAL_TABLET | Freq: Two times a day (BID) | ORAL | Status: DC
  Administered 2020-01-15 – 2020-01-16 (×2): 25 mg
  Filled 2020-01-15: qty 1

## 2020-01-15 MED ORDER — NOREPINEPHRINE 4 MG/250ML-% IV SOLN
0.0000 ug/min | INTRAVENOUS | Status: DC
  Administered 2020-01-15: 2 ug/min via INTRAVENOUS
  Administered 2020-01-16: 5 ug/min via INTRAVENOUS
  Filled 2020-01-15 (×2): qty 250

## 2020-01-15 MED ORDER — AMIODARONE LOAD VIA INFUSION
150.0000 mg | Freq: Once | INTRAVENOUS | Status: AC
  Administered 2020-01-15: 150 mg via INTRAVENOUS
  Filled 2020-01-15: qty 83.34

## 2020-01-15 MED ORDER — GLYCOPYRROLATE 0.2 MG/ML IJ SOLN
0.1000 mg | INTRAMUSCULAR | Status: DC | PRN
  Administered 2020-01-15 (×2): 0.1 mg via INTRAVENOUS
  Filled 2020-01-15 (×2): qty 1

## 2020-01-15 MED ORDER — AMIODARONE HCL IN DEXTROSE 360-4.14 MG/200ML-% IV SOLN
30.0000 mg/h | INTRAVENOUS | Status: DC
  Administered 2020-01-15 – 2020-01-18 (×9): 30 mg/h via INTRAVENOUS
  Filled 2020-01-15 (×8): qty 200

## 2020-01-15 MED ORDER — AMIODARONE HCL IN DEXTROSE 360-4.14 MG/200ML-% IV SOLN
60.0000 mg/h | INTRAVENOUS | Status: DC
  Administered 2020-01-15: 60 mg/h via INTRAVENOUS
  Filled 2020-01-15: qty 200

## 2020-01-15 MED ORDER — ADULT MULTIVITAMIN LIQUID CH
15.0000 mL | Freq: Every day | ORAL | Status: DC
  Administered 2020-01-15 – 2020-01-16 (×2): 15 mL
  Filled 2020-01-15 (×2): qty 15

## 2020-01-15 MED ORDER — TRAMADOL HCL 50 MG PO TABS: 50.0000 mg | ORAL_TABLET | Freq: Four times a day (QID) | ORAL | Status: DC | PRN

## 2020-01-15 MED ORDER — POLYETHYLENE GLYCOL 3350 17 G PO PACK: 17.0000 g | PACK | Freq: Every day | ORAL | Status: DC | PRN

## 2020-01-15 MED ORDER — SODIUM CHLORIDE 0.9% FLUSH: 10.0000 mL | INTRAVENOUS | Status: DC | PRN

## 2020-01-15 MED ORDER — SODIUM CHLORIDE 0.9% FLUSH
10.0000 mL | Freq: Two times a day (BID) | INTRAVENOUS | Status: DC
  Administered 2020-01-15 – 2020-01-21 (×13): 10 mL

## 2020-01-15 NOTE — Progress Notes (Signed)
PROGRESS NOTE    Omar Mccarthy  LPF:[id number] DOB: [birth year] DOA: 01/11/2020 PCP: [facility] [place]    Brief Narrative:  HPI: Omar Mccarthy is a 29 y.o. male with medical history significant for hypertension.  Patient presented to the ED with complaints of bilateral lower extremity swelling for the past several months.  He also reports intermittent vomiting, difficulty breathing also intermittent. Patient was in the ED in March, after he left the ED, his swelling improved at that time, but then reoccurred again.  He reports abdominal bloating, he also reports weight gain, his baseline weight is about 240, but his weight was checked today at the health department, it was 273. He reports difficulty breathing, intermittent, stable and unchanged 2-3 pillow orthopnea.  He reports chest pain ongoing over several years, intermittent, not related to activity, described both as sharp, pressure-like and burning, and usually related to his alcohol withdrawal episodes.  No family history of heart disease in parents or siblings. Patient drinks 24-26 beers daily or drinks half a gallon of vodka daily.  His last drink was at about 4 PM, just prior to arrival in the ED, 6 hours ago. Patient reports about 2 weeks ago, he had several episodes of black stools/abnormal colored stools, this lasted for about 2 months.  No vomiting.  Reports occasionally seeing blood when he wipes after a bowel movement.  He has not received the Covid vaccine.  ED Course: Tachycardic up to 150s.  BNP elevated 279.  Blood alcohol level elevated at 364.  Ammonia 62.  Lipase 112.  Troponin X 24.  Hemoglobin 11.7 down from 14.65 months ago..  Left lower extremity venous Dopplers negative for DVT.  Portable chest x-ray without acute abnormality. 1 L bolus given in ED with improvement in patient's heart rate.  Hospitalist to admit for alcohol intoxication and volume overload.   Assessment & Plan:   Principal  Problem:   Fluid overload Active Problems:   Alcohol abuse with intoxication (HCC)   Leg swelling   Respiratory failure requiring intubation (HCC)   Lack of intravenous access   1. Atrial fibrillation with rapid ventricular response. Echocardiogram shows ejection fraction of 40%.  CHA2DS2-VASc score of 1 at this time.  Does not appear to be a candidate for anticoagulation with low CHA2DS2-VASc score as well as ongoing issues with alcoholism. Initially required cardizem infusion and IV metoprolol. Cardizem had been weaned off but on 8/21, patient developed rapid atrial fibrillation with hypotension.  Heart rate ranging in the 150s to 180s with blood pressure in the 90s.  Discussed with Dr. Haroldine Laws patient started on amiodarone infusion. 2. Acute systolic CHF vs. PNA.  Patient initially presented with shortness of breath and concerns for volume overload. EF found to be 40% on echo. CTA chest negative for pulmonary embolism. Follow up chest xray shows pleural effusion with possible consolidation. Lactate was elevated, so he has not been receiving lasix.  Check coox to see if patient will benefit from inotropes.  Currently, on intravenous antibiotics. 3. Elevated liver enzymes.  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  Viral hepatitis panel negative, abdominal ultrasound unrevealing.  Discriminant function 12.9, currently not a candidate for steroids.  Transaminases are trending down. 4. Thrombocytopenia.  Suspect this is related to alcohol use.  Continue to follow. 5. Cellulitis of left lower extremity.  Noted to be erythematous, warm to touch.  Currently on Ancef.  Overall erythema improving 6. Sepsis. Patient was transiently febrile, tachycardic and tachypneic with elevated lactic  acid at 4.5. Sources of infection include cellulitis vs. Possible pneumonia. Lactate has improved to 1.8 with IV fluids, blood cultures in process. Hemodynamics are stabilizing. 7. Alcohol withdrawal with delirium tremens.   Patient started on Precedex for worsening alcohol withdrawal.  Despite maxing out on Precedex, he required restraints and intermittent doses of Ativan.  Despite these measures, patient remained tachypneic, tachycardic.  Respiratory rate in the 40s to 50s, heart rate in the 120s to 130s.  By mental status, he was significantly lethargic.  It appeared that he needed more sedation and would likely benefit from propofol.  He was subsequently intubated for airway support. 8. Mechanical ventilation for severe delirium tremens and respiratory distress. PCCM consulted to assist with vent management. 9. Cardiomyopathy. EF of 40% on echocardiogram.  Possibly tachycardia induced from atrial fibrillation versus alcohol.  Discussed with Dr. Domenic Polite with recommendations to continue medical management on beta-blockers.  No plans for invasive cardiac work-up at this point.  He can be followed up by cardiology as an outpatient.   DVT prophylaxis: SCDs Start: 01/11/20 2316  Code Status: Full code Family Communication: updated patients father over the phone Disposition Plan: Status is: Inpatient  Remains inpatient appropriate because:IV treatments appropriate due to intensity of illness or inability to take PO   Dispo: The patient is from: Home              Anticipated d/c is to: Home              Anticipated d/c date is: 2 days              Patient currently is not medically stable to d/c.   Consultants:   PCCM  Gen Surg for central line placement  Procedures:   Echo: EF 40% with global hypokinesis  ETT 8/19>  CVC left subclavian 8/21>  Antimicrobials:   Ancef 8/18>   Subjective: Patient became acutely tachycardic with heart rate in the 150s 180s this morning.  Blood pressure in the 90s.  No fevers overnight.  He remains intubated.  Objective: Vitals:   01/15/20 1545 01/15/20 1600 01/15/20 1615 01/15/20 1630  BP: 121/61 (!) 119/91 120/66 123/67  Pulse: 67 62 80 81  Resp: 15 20 14 18     Temp:      TempSrc:      SpO2: 100% 99% 99% 99%  Weight:      Height:        Intake/Output Summary (Last 24 hours) at 01/15/2020 1652 Last data filed at 01/15/2020 1100 Gross per 24 hour  Intake 1309.3 ml  Output 750 ml  Net 559.3 ml   Filed Weights   01/13/20 0500 01/14/20 0513 01/15/20 0345  Weight: 125.8 kg 126.3 kg 129.1 kg    Examination:  General exam: intubated and sedated Respiratory system: Clear to auscultation. Respiratory effort normal. Cardiovascular system:irregular. No murmurs, rubs, gallops. Gastrointestinal system: Abdomen is nondistended, soft and nontender. No organomegaly or masses felt. Normal bowel sounds heard. Central nervous system: No focal neurological deficits. Extremities: 1+ edema Skin: erythema in LLE is better Psychiatry: sedated    Data Reviewed: I have personally reviewed following labs and imaging studies  CBC: Recent Labs  Lab 01/11/20 1914 01/11/20 1914 01/11/20 1956 01/12/20 0339 01/13/20 0822 01/14/20 0505 01/15/20 0421  WBC 4.5  --   --  4.1 6.0 8.5 8.1  NEUTROABS 3.1  --   --   --   --   --   --   HGB 11.7*   < >  11.9* 10.8* 12.2* 12.2* 11.4*  HCT 34.7*   < > 35.0* 32.8* 36.7* 37.2* 35.0*  MCV 105.2*  --   --  105.8* 106.1* 107.8* 108.0*  PLT 96*  --   --  76* 65* 62* 64*   < > = values in this interval not displayed.   Basic Metabolic Panel: Recent Labs  Lab 01/11/20 1914 01/11/20 1914 01/11/20 1956 01/12/20 0339 01/13/20 0822 01/14/20 0505 01/14/20 1701 01/15/20 0421  NA 139   < > 140 138 136 136  --  136  K 4.3   < > 4.5 4.2 4.4 3.6  --  3.5  CL 106   < > 105 108 102 106  --  106  CO2 18*  --   --  19* 20* 15*  --  21*  GLUCOSE 120*   < > 113* 119* 160* 113*  --  84  BUN 5*   < > 3* 6 8 11   --  13  CREATININE 0.60*   < > 1.20 0.61 0.90 0.84  --  0.55*  CALCIUM 8.3*  --   --  8.1* 8.6* 7.9*  --  7.7*  MG 2.0  --   --   --   --   --  1.6* 1.7  PHOS 4.4  --   --   --   --   --  3.2 3.2   < > = values in  this interval not displayed.   GFR: Estimated Creatinine Clearance: 175.9 mL/min (A) (by C-G formula based on SCr of 0.55 mg/dL (L)). Liver Function Tests: Recent Labs  Lab 01/11/20 1914 01/12/20 0339 01/13/20 0822 01/14/20 0505 01/15/20 0421  AST 203* 209* 114* 164* 152*  ALT 102* 94* 78* 69* 62*  ALKPHOS 138* 157* 151* 114 112  BILITOT 1.4* 1.2 2.8* 2.7* 3.0*  PROT 7.1 6.6 6.9 5.9* 5.5*  ALBUMIN 3.6 3.3* 3.4* 2.9* 2.4*   Recent Labs  Lab 01/11/20 1914 01/12/20 0339 01/13/20 1217  LIPASE 112* 117* 40   Recent Labs  Lab 01/11/20 1914  AMMONIA 62*   Coagulation Profile: Recent Labs  Lab 01/11/20 1914 01/13/20 0822  INR 1.2 1.3*   Cardiac Enzymes: No results for input(s): CKTOTAL, CKMB, CKMBINDEX, TROPONINI in the last 168 hours. BNP (last 3 results) No results for input(s): PROBNP in the last 8760 hours. HbA1C: No results for input(s): HGBA1C in the last 72 hours. CBG: Recent Labs  Lab 01/14/20 2315 01/15/20 0349 01/15/20 0812 01/15/20 1129 01/15/20 1541  GLUCAP 83 80 93 110* 116*   Lipid Profile: Recent Labs    01/14/20 0505 01/15/20 0421  TRIG 138 109   Thyroid Function Tests: No results for input(s): TSH, T4TOTAL, FREET4, T3FREE, THYROIDAB in the last 72 hours. Anemia Panel: No results for input(s): VITAMINB12, FOLATE, FERRITIN, TIBC, IRON, RETICCTPCT in the last 72 hours. Sepsis Labs: Recent Labs  Lab 01/11/20 2112 01/12/20 0339 01/13/20 1932 01/14/20 0505  LATICACIDVEN 3.2* 2.0* 4.5* 1.8    Recent Results (from the past 240 hour(s))  SARS Coronavirus 2 by RT PCR (hospital order, performed in [facility] hospital lab) Nasopharyngeal Nasopharyngeal Swab     Status: None   Collection Time: 01/11/20  7:01 PM   Specimen: Nasopharyngeal Swab  Result Value Ref Range Status   SARS Coronavirus 2 NEGATIVE NEGATIVE Final    Comment: (NOTE) SARS-CoV-2 target nucleic acids are NOT DETECTED.  The SARS-CoV-2 RNA is generally detectable in upper  and lower respiratory specimens during  the acute phase of infection. The lowest concentration of SARS-CoV-2 viral copies this assay can detect is 250 copies / mL. A negative result does not preclude SARS-CoV-2 infection and should not be used as the sole basis for treatment or other patient management decisions.  A negative result may occur with improper specimen collection / handling, submission of specimen other than nasopharyngeal swab, presence of viral mutation(s) within the areas targeted by this assay, and inadequate number of viral copies (<250 copies / mL). A negative result must be combined with clinical observations, patient history, and epidemiological information.  Fact Sheet for Patients:   StrictlyIdeas.no  Fact Sheet for Healthcare Providers: BankingDealers.co.za  This test is not yet approved or  cleared by the [place] FDA and has been authorized for detection and/or diagnosis of SARS-CoV-2 by FDA under an Emergency Use Authorization (EUA).  This EUA will remain in effect (meaning this test can be used) for the duration of the COVID-19 declaration under Section 564(b)(1) of the Act, 21 U.S.C. section 360bbb-3(b)(1), unless the authorization is terminated or revoked sooner.  Performed at [facility], [address] [place], [place] [address]   Culture, blood (routine x 2)     Status: None (Preliminary result)   Collection Time: 01/11/20  7:14 PM   Specimen: BLOOD RIGHT ARM  Result Value Ref Range Status   Specimen Description BLOOD RIGHT ARM  Final   Special Requests   Final    BOTTLES DRAWN AEROBIC AND ANAEROBIC Blood Culture adequate volume   Culture   Final    NO GROWTH 4 DAYS Performed at [facility], [address], [place], [place] [address]    Report Status PENDING  Incomplete  Culture, blood (routine x 2)     Status: Abnormal   Collection Time: 01/11/20  7:24 PM   Specimen: BLOOD RIGHT FOREARM    Result Value Ref Range Status   Specimen Description   Final    BLOOD RIGHT FOREARM Performed at [facility], [address] [place], [place] [address]    Special Requests   Final    BOTTLES DRAWN AEROBIC AND ANAEROBIC Blood Culture adequate volume Performed at [facility], [address], [place], [place] [address]    Culture  Setup Time   Final    GRAM POSITIVE COCCI aerobic bottle Gram Stain Report Called to,Read Back By and Verified With: HILTON, L@ [phone] by MATTHEWS, B 8.18.21 [facility] Organism ID to follow CRITICAL RESULT CALLED TO, READ BACK BY AND VERIFIED WITH: H TETREAULT RN 01/12/20 2254 JDW    Culture (A)  Final    STAPHYLOCOCCUS HOMINIS THE SIGNIFICANCE OF ISOLATING THIS ORGANISM FROM A SINGLE SET OF BLOOD CULTURES WHEN MULTIPLE SETS ARE DRAWN IS UNCERTAIN. PLEASE NOTIFY THE MICROBIOLOGY DEPARTMENT WITHIN ONE WEEK IF SPECIATION AND SENSITIVITIES ARE REQUIRED. Performed at [facility], [place] [address] [place],  [address]    Report Status 01/13/2020 FINAL  Final  Blood Culture ID Panel (Reflexed)     Status: Abnormal   Collection Time: 01/11/20  7:24 PM  Result Value Ref Range Status   Enterococcus faecalis NOT DETECTED NOT DETECTED Final   Enterococcus Faecium NOT DETECTED NOT DETECTED Final   Listeria monocytogenes NOT DETECTED NOT DETECTED Final   Staphylococcus species DETECTED (A) NOT DETECTED Final    Comment: CRITICAL RESULT CALLED TO, READ BACK BY AND VERIFIED WITH: H TETREAULT RN 01/12/20 2254 JDW    Staphylococcus aureus (BCID) NOT DETECTED NOT DETECTED Final  Staphylococcus epidermidis NOT DETECTED NOT DETECTED Final   Staphylococcus lugdunensis NOT DETECTED NOT DETECTED Final   Streptococcus species NOT DETECTED NOT DETECTED Final   Streptococcus agalactiae NOT DETECTED NOT DETECTED Final   Streptococcus pneumoniae NOT DETECTED NOT DETECTED Final   Streptococcus pyogenes NOT DETECTED NOT DETECTED Final   A.calcoaceticus-baumannii  NOT DETECTED NOT DETECTED Final   Bacteroides fragilis NOT DETECTED NOT DETECTED Final   Enterobacterales NOT DETECTED NOT DETECTED Final   Enterobacter cloacae complex NOT DETECTED NOT DETECTED Final   Escherichia coli NOT DETECTED NOT DETECTED Final   Klebsiella aerogenes NOT DETECTED NOT DETECTED Final   Klebsiella oxytoca NOT DETECTED NOT DETECTED Final   Klebsiella pneumoniae NOT DETECTED NOT DETECTED Final   Proteus species NOT DETECTED NOT DETECTED Final   Salmonella species NOT DETECTED NOT DETECTED Final   Serratia marcescens NOT DETECTED NOT DETECTED Final   Haemophilus influenzae NOT DETECTED NOT DETECTED Final   Neisseria meningitidis NOT DETECTED NOT DETECTED Final   Pseudomonas aeruginosa NOT DETECTED NOT DETECTED Final   Stenotrophomonas maltophilia NOT DETECTED NOT DETECTED Final   Candida albicans NOT DETECTED NOT DETECTED Final   Candida auris NOT DETECTED NOT DETECTED Final   Candida glabrata NOT DETECTED NOT DETECTED Final   Candida krusei NOT DETECTED NOT DETECTED Final   Candida parapsilosis NOT DETECTED NOT DETECTED Final   Candida tropicalis NOT DETECTED NOT DETECTED Final   Cryptococcus neoformans/gattii NOT DETECTED NOT DETECTED Final    Comment: Performed at [facility], [address], [place], [place] [address]  MRSA PCR Screening     Status: None   Collection Time: 01/12/20  6:45 AM   Specimen: Nasal Mucosa; Nasopharyngeal  Result Value Ref Range Status   MRSA by PCR NEGATIVE NEGATIVE Final    Comment:        The GeneXpert MRSA Assay (FDA approved for NASAL specimens only), is one component of a comprehensive MRSA colonization surveillance program. It is not intended to diagnose MRSA infection nor to guide or monitor treatment for MRSA infections. Performed at [facility], [address], [place], [place] [address]   Culture, blood (routine x 2)     Status: None (Preliminary result)   Collection Time: 01/13/20 12:16 PM   Specimen: BLOOD    Result Value Ref Range Status   Specimen Description BLOOD LEFT ANTECUBITAL  Final   Special Requests   Final    BOTTLES DRAWN AEROBIC AND ANAEROBIC Blood Culture adequate volume   Culture   Final    NO GROWTH 2 DAYS Performed at [facility], [address] [place], [place] [address]    Report Status PENDING  Incomplete  Culture, blood (routine x 2)     Status: None (Preliminary result)   Collection Time: 01/13/20 12:16 PM   Specimen: BLOOD  Result Value Ref Range Status   Specimen Description BLOOD LEFT HAND  Final   Special Requests   Final    BOTTLES DRAWN AEROBIC AND ANAEROBIC Blood Culture adequate volume   Culture   Final    NO GROWTH 2 DAYS Performed at [facility], [address], [place], [place] [address]    Report Status PENDING  Incomplete  Culture, blood (routine x 2)     Status: None (Preliminary result)   Collection Time: 01/14/20  4:56 AM   Specimen: BLOOD LEFT HAND  Result Value Ref Range Status   Specimen Description BLOOD LEFT HAND  Final   Special Requests   Final  BOTTLES DRAWN AEROBIC AND ANAEROBIC Blood Culture adequate volume   Culture   Final    NO GROWTH 1 DAY Performed at [facility], [address] [place], [place] [address]    Report Status PENDING  Incomplete  Culture, blood (routine x 2)     Status: None (Preliminary result)   Collection Time: 01/14/20  5:05 AM   Specimen: BLOOD LEFT HAND  Result Value Ref Range Status   Specimen Description BLOOD LEFT HAND  Final   Special Requests   Final    BOTTLES DRAWN AEROBIC AND ANAEROBIC Blood Culture adequate volume   Culture   Final    NO GROWTH 1 DAY Performed at [facility], [address] [place], [place] [address]    Report Status PENDING  Incomplete         Radiology Studies: DG CHEST PORT 1 VIEW  Result Date: 01/15/2020 CLINICAL DATA:  Central line placement EXAM: PORTABLE CHEST 1 VIEW COMPARISON:  January 15, 2020 FINDINGS: A new left central line terminates in the region the  SVC. No pneumothorax. The ETT is in good position. The NG tube terminates below today's film. A right-sided pleural effusion is stable. Bilateral pulmonary opacities, right greater than left are stable. Probable small layering effusion. The cardiomediastinal silhouette is stable. No other acute abnormalities. IMPRESSION: 1. Small bilateral effusions, right greater than left. 2. Support apparatus as above. No pneumothorax after left central line placement. 3. Right greater than left pulmonary opacities remain, mildly improved in the interval. Recommend attention on follow-up. Electronically Signed   By: Dorise Bullion III M.D   On: 01/15/2020 10:48   DG CHEST PORT 1 VIEW  Result Date: 01/15/2020 CLINICAL DATA:  29 year old male with history of acute respiratory failure. EXAM: PORTABLE CHEST 1 VIEW COMPARISON:  Chest x-ray 01/14/2020. FINDINGS: An endotracheal tube is in place with tip 3.8 cm above the carina. A nasogastric tube is seen extending into the stomach, however, the tip of the nasogastric tube extends below the lower margin of the image. Lung volumes are low. Moderate right and small left pleural effusions. Bibasilar opacities (right greater than left), with definite air bronchograms on the right. No pneumothorax. Heart size is mildly enlarged. Upper mediastinal contours are within normal limits allowing for patient positioning. IMPRESSION: 1. Support apparatus, as above. 2. Airspace consolidation in the right lower lobe concerning for pneumonia. Additional left basilar atelectasis and/or consolidation. 3. Enlarging moderate right and small left pleural effusions. Electronically Signed   By: Vinnie Langton M.D.   On: 01/15/2020 06:33   DG CHEST PORT 1 VIEW  Result Date: 01/14/2020 CLINICAL DATA:  Intubation EXAM: PORTABLE CHEST 1 VIEW COMPARISON:  Earlier same day FINDINGS: Endotracheal tube tip is 6 cm above the carina. Orogastric or nasogastric tube enters the stomach. Cardiomegaly, bilateral  effusions and pulmonary edema appear similar to the previous study. IMPRESSION: Lines and tubes well positioned. Heart failure pattern as seen previously. Electronically Signed   By: Nelson Chimes M.D.   On: 01/14/2020 21:48   Portable Chest xray  Result Date: 01/14/2020 CLINICAL DATA:  Hypoxia EXAM: PORTABLE CHEST 1 VIEW COMPARISON:  January 13, 2020. FINDINGS: Endotracheal tube tip is 5.4 cm above the carina. Nasogastric tube tip and side port are below the diaphragm. No pneumothorax. There are pleural effusions bilaterally which appear to be partially layering. There is consolidation in both lung bases. No new opacity evident. There is cardiomegaly with a degree of pulmonary venous hypertension. No adenopathy evident. No bone  lesions. IMPRESSION: Tube positions as described without pneumothorax. Cardiomegaly with pulmonary vascular congestion and pleural effusion. Suspect a degree of congestive heart failure. Airspace consolidation is noted in both lung bases, primarily medially. Suspect combination of atelectasis and pneumonia in these areas. A degree of alveolar edema may also be present in these areas. The appearance of the lungs is similar to 1 day prior. Electronically Signed   By: Lowella Grip III M.D.   On: 01/14/2020 08:07   DG CHEST PORT 1 VIEW  Result Date: 01/13/2020 CLINICAL DATA:  Post intubation EXAM: PORTABLE CHEST 1 VIEW COMPARISON:  01/11/2020, CT 01/12/2020 FINDINGS: Cardiomegaly with vascular congestion and hazy perihilar opacities suspicious for mild pulmonary edema. At least small bilateral effusions. Endotracheal tube tip is about 4.3 cm superior to carina. Esophageal tube tip is below the diaphragm but is incompletely visualized. IMPRESSION: 1. Cardiomegaly with vascular congestion and hazy perihilar opacities suspicious for mild pulmonary edema. At least small bilateral effusions. 2. Endotracheal tube tip about 4.3 cm superior to carina. Electronically Signed   By: Donavan Foil  M.D.   On: 01/13/2020 20:17   [place] EKG SITE RITE  Result Date: 01/15/2020 If Site Rite image not attached, placement could not be confirmed due to current cardiac rhythm.       Scheduled Meds: . chlorhexidine gluconate (MEDLINE KIT)  15 mL Mouth Rinse BID  . Chlorhexidine Gluconate Cloth  6 each Topical Daily  . docusate  100 mg Per Tube BID  . feeding supplement (PROSource TF)  90 mL Per Tube TID  . feeding supplement (VITAL HIGH PROTEIN)  1,000 mL Per Tube Q24H  . folic acid  1 mg Per Tube Daily  . mouth rinse  15 mL Mouth Rinse 10 times per day  . metoprolol tartrate  25 mg Per Tube BID  . multivitamin  15 mL Per Tube Daily  . nicotine  21 mg Transdermal Daily  . pantoprazole sodium  40 mg Per Tube Q24H  . polyethylene glycol  17 g Oral Daily  . sodium chloride flush  10-40 mL Intracatheter Q12H  . thiamine  100 mg Per Tube Daily   Or  . thiamine  100 mg Intravenous Daily   Continuous Infusions: . amiodarone 30 mg/hr (01/15/20 1300)  .  ceFAZolin (ANCEF) IV 2 g (01/15/20 1303)  . fentaNYL infusion INTRAVENOUS 200 mcg/hr (01/15/20 1049)  . norepinephrine (LEVOPHED) Adult infusion 2 mcg/min (01/15/20 1510)  . propofol (DIPRIVAN) infusion 50 mcg/kg/min (01/15/20 1604)     LOS: 4 days    Critical care procedure note Authorized and performed by: Kathie Dike Total critical care time: Approximately 35 minutes Due to high probability of clinically significant, life-threatening deterioration, the patient required my highest level of preparedness to intervene emergently and I personally spent this critical care time directly and personally managing the patient.  The critical care time included obtaining a history, examining the patient, pulse oximetry, ordering and review of studies, arranging urgent treatment with development of a management plan, evaluation of patient's response to treatment, frequent reassessment, discussions with other providers.  Critical care time was  performed to assess and manage the high probability of imminent, life-threatening deterioration that could result in multiorgan failure.  It was exclusive of separate billable procedures and treating other patients and teaching time.  Please see MDM section and the rest of the of note for further information on patient assessment and treatment     Kathie Dike, MD [facility]   If 7PM-7AM,  please contact night-coverage [email or web address]  01/15/2020, 4:52 PM

## 2020-01-15 NOTE — Progress Notes (Signed)
Noticed patients blood pressure reading low. Evaluated patient, responsive to stimuli, repositioned BP cuff and reduced sedation medication. Dr. Kerry Hough at bedside to evaluate patient.

## 2020-01-15 NOTE — Progress Notes (Signed)
During assessment for shift, nurse found patient to be apneic, increased work of breathing, and anxious. Patient HOB raised. OG length assessed and found to be in patient's mouth and tube feed was present in mouth, nose, and ETT tube. Tube feed turned off.  OG removed, patient ETT suctioned, oral suctioning performed. New OG placed and stat CXR ordered. Placement of new OG was confirmed. Once OG placement was confirmed, tube feed restarted and tube meds given. Continue to monitor. OG measures 56 externally.

## 2020-01-15 NOTE — Progress Notes (Signed)
MD contacted regarding HR elevation. Awaiting orders/response at this time.

## 2020-01-15 NOTE — Op Note (Signed)
Patient:  Omar Mccarthy  DOB:  12-17-1990  MRN:  [id number]   Preop Diagnosis: Congestive heart failure, ventilatory dependent respiratory failure  Postop Diagnosis: Same  Procedure: Central line placement  Surgeon: Franky Macho, MD  Anes: Local  Indications: Patient is a 29 year old white male with multiple medical problems including congestive heart failure and ventilatory dependent respiratory failure who is in need of IV access for multiple medications.  The risks and benefits of the procedure were explained to the patient's father, who gave informed consent for the patient as the patient is intubated and sedated.  Procedure note: The procedure was done at bedside in ICU 10.  Patient was placed in Trendelenburg position.  The left upper chest was prepped and draped using usual sterile technique with ChloraPrep.  Surgical site confirmation was performed.  1% Xylocaine was used for local anesthesia.  A needle was advanced into the left subclavian vein using the Seldinger technique without difficulty.  A guidewire was then advanced.  An introducer was placed over the guidewire.  A triple-lumen catheter was then inserted to the 18 cm Kyndel Egger.  The guidewire was removed.  Good backflow of venous blood was noted on aspiration of all 3 ports.  All 3 ports were flushed with saline.  The catheter was secured in place using 3-0 silk sutures.  Dry sterile dressing was applied.  A chest x-ray is pending.  The patient tolerated the procedure well.  Complications: None  EBL: Minimal  Specimen: None

## 2020-01-15 NOTE — Progress Notes (Addendum)
Levophed restarted at on Patient due to being extremely hypotensive. Per MD hold metoprolol b/c of hypotension. MD made aware as order is active. Continue to monitor.

## 2020-01-15 NOTE — Progress Notes (Signed)
Secure chat with Dr Kerry Hough.  Blood cultures pending with adequate PIV access for current meds.  Dr Kerry Hough states to have CVC placed and d/c PICC order.

## 2020-01-16 ENCOUNTER — Inpatient Hospital Stay (HOSPITAL_COMMUNITY): Payer: Self-pay

## 2020-01-16 LAB — CBC
HCT: 35.1 % — ABNORMAL LOW (ref 39.0–52.0)
Hemoglobin: 11.4 g/dL — ABNORMAL LOW (ref 13.0–17.0)
MCH: 35 pg — ABNORMAL HIGH (ref 26.0–34.0)
MCHC: 32.5 g/dL (ref 30.0–36.0)
MCV: 107.7 fL — ABNORMAL HIGH (ref 80.0–100.0)
Platelets: 89 10*3/uL — ABNORMAL LOW (ref 150–400)
RBC: 3.26 MIL/uL — ABNORMAL LOW (ref 4.22–5.81)
RDW: 16.3 % — ABNORMAL HIGH (ref 11.5–15.5)
WBC: 7.2 10*3/uL (ref 4.0–10.5)
nRBC: 0 % (ref 0.0–0.2)

## 2020-01-16 LAB — GLUCOSE, CAPILLARY
Glucose-Capillary: 136 mg/dL — ABNORMAL HIGH (ref 70–99)
Glucose-Capillary: 108 mg/dL — ABNORMAL HIGH (ref 70–99)
Glucose-Capillary: 131 mg/dL — ABNORMAL HIGH (ref 70–99)
Glucose-Capillary: 102 mg/dL — ABNORMAL HIGH (ref 70–99)
Glucose-Capillary: 115 mg/dL — ABNORMAL HIGH (ref 70–99)
Glucose-Capillary: 127 mg/dL — ABNORMAL HIGH (ref 70–99)

## 2020-01-16 LAB — COMPREHENSIVE METABOLIC PANEL
ALT: 71 U/L — ABNORMAL HIGH (ref 0–44)
AST: 152 U/L — ABNORMAL HIGH (ref 15–41)
Albumin: 2.4 g/dL — ABNORMAL LOW (ref 3.5–5.0)
Alkaline Phosphatase: 133 U/L — ABNORMAL HIGH (ref 38–126)
Anion gap: 8 (ref 5–15)
BUN: 10 mg/dL (ref 6–20)
CO2: 23 mmol/L (ref 22–32)
Calcium: 7.8 mg/dL — ABNORMAL LOW (ref 8.9–10.3)
Chloride: 103 mmol/L (ref 98–111)
Creatinine, Ser: 0.48 mg/dL — ABNORMAL LOW (ref 0.61–1.24)
GFR calc Af Amer: >60 mL/min (ref >60)
GFR calc non Af Amer: >60 mL/min (ref >60)
Glucose, Bld: 115 mg/dL — ABNORMAL HIGH (ref 70–99)
Potassium: 3 mmol/L — ABNORMAL LOW (ref 3.5–5.1)
Sodium: 134 mmol/L — ABNORMAL LOW (ref 135–145)
Total Bilirubin: 2.9 mg/dL — ABNORMAL HIGH (ref 0.3–1.2)
Total Protein: 5.5 g/dL — ABNORMAL LOW (ref 6.5–8.1)

## 2020-01-16 LAB — TRIGLYCERIDES: Triglycerides: 149 mg/dL (ref <150)

## 2020-01-16 LAB — CULTURE, BLOOD (ROUTINE X 2)
Culture: NO GROWTH
Special Requests: ADEQUATE

## 2020-01-16 LAB — MAGNESIUM: Magnesium: 1.8 mg/dL (ref 1.7–2.4)

## 2020-01-16 MED ORDER — METOPROLOL TARTRATE 25 MG PO TABS
25.0000 mg | ORAL_TABLET | ORAL | Status: AC
  Administered 2020-01-16: 25 mg via ORAL
  Filled 2020-01-16: qty 1

## 2020-01-16 MED ORDER — LORAZEPAM 2 MG/ML IJ SOLN
1.0000 mg | INTRAMUSCULAR | Status: DC | PRN
  Administered 2020-01-16 – 2020-01-19 (×12): 2 mg via INTRAVENOUS
  Filled 2020-01-16 (×12): qty 1

## 2020-01-16 MED ORDER — METOPROLOL TARTRATE 50 MG PO TABS
50.0000 mg | ORAL_TABLET | Freq: Two times a day (BID) | ORAL | Status: DC
  Administered 2020-01-16 – 2020-01-17 (×3): 50 mg via ORAL
  Filled 2020-01-16 (×3): qty 1

## 2020-01-16 MED ORDER — DILTIAZEM HCL-DEXTROSE 125-5 MG/125ML-% IV SOLN (PREMIX)
5.0000 mg/h | INTRAVENOUS | Status: DC
  Administered 2020-01-16: 5 mg/h via INTRAVENOUS
  Administered 2020-01-17 – 2020-01-19 (×7): 15 mg/h via INTRAVENOUS
  Administered 2020-01-19: 7.5 mg/h via INTRAVENOUS
  Filled 2020-01-16: qty 250
  Filled 2020-01-16 (×7): qty 125

## 2020-01-16 MED ORDER — DILTIAZEM LOAD VIA INFUSION
15.0000 mg | Freq: Once | INTRAVENOUS | Status: AC
  Administered 2020-01-16: 15 mg via INTRAVENOUS
  Filled 2020-01-16: qty 15

## 2020-01-16 MED ORDER — METOPROLOL TARTRATE 50 MG PO TABS: 50.0000 mg | ORAL_TABLET | Freq: Two times a day (BID) | ORAL | Status: DC

## 2020-01-16 MED ORDER — DOCUSATE SODIUM 50 MG/5ML PO LIQD
100.0000 mg | Freq: Two times a day (BID) | ORAL | Status: DC
  Administered 2020-01-16: 100 mg via ORAL
  Filled 2020-01-16 (×6): qty 10

## 2020-01-16 MED ORDER — PANTOPRAZOLE SODIUM 40 MG PO PACK
40.0000 mg | PACK | ORAL | Status: DC
  Administered 2020-01-16 – 2020-01-21 (×5): 40 mg via ORAL
  Filled 2020-01-16 (×5): qty 20

## 2020-01-16 MED ORDER — CHLORHEXIDINE GLUCONATE 0.12 % MT SOLN
15.0000 mL | Freq: Two times a day (BID) | OROMUCOSAL | Status: DC
  Administered 2020-01-17 – 2020-01-19 (×5): 15 mL via OROMUCOSAL
  Filled 2020-01-16 (×5): qty 15

## 2020-01-16 MED ORDER — FUROSEMIDE 10 MG/ML IJ SOLN
20.0000 mg | Freq: Once | INTRAMUSCULAR | Status: AC
  Administered 2020-01-16: 20 mg via INTRAVENOUS
  Filled 2020-01-16: qty 2

## 2020-01-16 MED ORDER — PHENOL 1.4 % MT LIQD
1.0000 | OROMUCOSAL | Status: DC | PRN
  Administered 2020-01-16: 1 via OROMUCOSAL
  Filled 2020-01-16: qty 177

## 2020-01-16 MED ORDER — POTASSIUM CHLORIDE 10 MEQ/100ML IV SOLN
10.0000 meq | INTRAVENOUS | Status: AC
  Administered 2020-01-16 (×6): 10 meq via INTRAVENOUS
  Filled 2020-01-16 (×6): qty 100

## 2020-01-16 MED ORDER — ORAL CARE MOUTH RINSE
15.0000 mL | Freq: Two times a day (BID) | OROMUCOSAL | Status: DC
  Administered 2020-01-17 (×2): 15 mL via OROMUCOSAL

## 2020-01-16 NOTE — ED Provider Notes (Signed)
[facility]  Department of Emergency Medicine   Code Blue CONSULT NOTE  Chief Complaint: Self Extubation  Level V Caveat: Critical illness  History of present illness: I was contacted by the hospital for respiratory distress upstairs and presented to the patient's bedside.  This patient had self extubated while on a ventilator in the intensive care unit.  He was intubated for severe respiratory distress in the presence of delirium tremens and alcohol withdrawal.  ROS: Unable to obtain, Level V caveat  Scheduled Meds: . chlorhexidine gluconate (MEDLINE KIT)  15 mL Mouth Rinse BID  . Chlorhexidine Gluconate Cloth  6 each Topical Daily  . docusate  100 mg Per Tube BID  . feeding supplement (PROSource TF)  90 mL Per Tube TID  . feeding supplement (VITAL HIGH PROTEIN)  1,000 mL Per Tube Q24H  . folic acid  1 mg Per Tube Daily  . mouth rinse  15 mL Mouth Rinse 10 times per day  . metoprolol tartrate  25 mg Per Tube BID  . multivitamin  15 mL Per Tube Daily  . nicotine  21 mg Transdermal Daily  . pantoprazole sodium  40 mg Per Tube Q24H  . polyethylene glycol  17 g Oral Daily  . sodium chloride flush  10-40 mL Intracatheter Q12H  . thiamine  100 mg Per Tube Daily   Or  . thiamine  100 mg Intravenous Daily   Continuous Infusions: . amiodarone 30 mg/hr (01/16/20 1052)  .  ceFAZolin (ANCEF) IV Stopped (01/16/20 0535)  . fentaNYL infusion INTRAVENOUS 100 mcg/hr (01/16/20 1122)  . norepinephrine (LEVOPHED) Adult infusion 5 mcg/min (01/16/20 8088)  . propofol (DIPRIVAN) infusion 25 mcg/kg/min (01/16/20 1121)   PRN Meds:.acetaminophen, acetaminophen, fentaNYL, glycopyrrolate, iohexol, ondansetron **OR** ondansetron (ZOFRAN) IV, polyethylene glycol, sodium chloride flush, traMADol Past Medical History:  Diagnosis Date  . Hypertension    History reviewed. No pertinent surgical history. Social History   Socioeconomic History  . Marital status: Married    Spouse name:  Not on file  . Number of children: Not on file  . Years of education: Not on file  . Highest education level: Not on file  Occupational History  . Not on file  Tobacco Use  . Smoking status: Current Every Day Smoker  . Smokeless tobacco: Never Used  Substance and Sexual Activity  . Alcohol use: Not on file    Comment: heavily daily  . Drug use: Never  . Sexual activity: Not on file  Other Topics Concern  . Not on file  Social History Narrative  . Not on file   Social Determinants of Health   Financial Resource Strain:   . Difficulty of Paying Living Expenses: Not on file  Food Insecurity:   . Worried About Programme researcher, broadcasting/film/video in the Last Year: Not on file  . Ran Out of Food in the Last Year: Not on file  Transportation Needs:   . Lack of Transportation (Medical): Not on file  . Lack of Transportation (Non-Medical): Not on file  Physical Activity:   . Days of Exercise per Week: Not on file  . Minutes of Exercise per Session: Not on file  Stress:   . Feeling of Stress : Not on file  Social Connections:   . Frequency of Communication with Friends and Family: Not on file  . Frequency of Social Gatherings with Friends and Family: Not on file  . Attends Religious Services: Not on file  . Active Member of Clubs  or Organizations: Not on file  . Attends Archivist Meetings: Not on file  . Marital Status: Not on file  Intimate Partner Violence:   . Fear of Current or Ex-Partner: Not on file  . Emotionally Abused: Not on file  . Physically Abused: Not on file  . Sexually Abused: Not on file   No Known Allergies  Last set of Vital Signs (not current) Vitals:   01/16/20 1145 01/16/20 1225  BP:    Pulse: 94   Resp: 18   Temp:    SpO2: 100% 100%      Physical Exam  Gen: Responsive, awake, distress Cardiovascular: Pulses, tachycardic, atrial fibrillation with rapid ventricular rate Resp: apneic. Breath sounds equal bilaterally, mild tachypnea Neuro: Awake,  follows commands, very weak HEENT: No blood in posterior pharynx, gag reflex absent  Neck: No crepitus  Musculoskeletal: No deformity  Skin: warm   Medical Decision making  I assisted with resuscitative efforts and evaluation at the bedside at the request of the hospitalist service in the intensive care unit as this patient had required intubation in the past for severe respiratory distress.  After evaluation at the bedside and the patient's improvement with heart rate and respiratory status and his ability to start to clear secretions the decision was made between the hospitalist service and myself that this patient could probably be observed in the intensive care unit without repeat intubation at this time in the interest of significant improvement in a short timeframe.  He is critically ill, in a rapid cardiac arrhythmia but oxygenation is improving, mentation is improving.  I agree with observation and avoiding intubation at this time.  Assessment and Plan  Observation, intubation as needed   Noemi Chapel, MD 01/16/20 1311

## 2020-01-16 NOTE — Procedures (Signed)
Extubation Procedure Note  Patient self extubated and he was then placed on 2L [place] with SpO2 of 98-100%  Patient Details:   Name: Omar Mccarthy DOB: 02/16/1991 MRN: [id number]   Airway Documentation:     Vent end time: 1259  on 01/16/20  Evaluation  O2 sats: currently acceptable Complications: No apparent complications Patient did tolerate procedure well. Bilateral Breath Sounds: Rhonchi ,diminshed   Yes  Lina Sayre 01/16/2020, 1:12 PM

## 2020-01-16 NOTE — Progress Notes (Signed)
PROGRESS NOTE    THOR NANNINI  LNL:[id number] DOB: [birth year] DOA: 01/11/2020 PCP: [facility] [place]    Brief Narrative:  HPI: Omar Mccarthy is a 29 y.o. male with medical history significant for hypertension.  Patient presented to the ED with complaints of bilateral lower extremity swelling for the past several months.  He also reports intermittent vomiting, difficulty breathing also intermittent. Patient was in the ED in March, after he left the ED, his swelling improved at that time, but then reoccurred again.  He reports abdominal bloating, he also reports weight gain, his baseline weight is about 240, but his weight was checked today at the health department, it was 273. He reports difficulty breathing, intermittent, stable and unchanged 2-3 pillow orthopnea.  He reports chest pain ongoing over several years, intermittent, not related to activity, described both as sharp, pressure-like and burning, and usually related to his alcohol withdrawal episodes.  No family history of heart disease in parents or siblings. Patient drinks 24-26 beers daily or drinks half a gallon of vodka daily.  His last drink was at about 4 PM, just prior to arrival in the ED, 6 hours ago. Patient reports about 2 weeks ago, he had several episodes of black stools/abnormal colored stools, this lasted for about 2 months.  No vomiting.  Reports occasionally seeing blood when he wipes after a bowel movement.  He has not received the Covid vaccine.  ED Course: Tachycardic up to 150s.  BNP elevated 279.  Blood alcohol level elevated at 364.  Ammonia 62.  Lipase 112.  Troponin X 24.  Hemoglobin 11.7 down from 14.65 months ago..  Left lower extremity venous Dopplers negative for DVT.  Portable chest x-ray without acute abnormality. 1 L bolus given in ED with improvement in patient's heart rate.  Hospitalist to admit for alcohol intoxication and volume overload.   Assessment & Plan:   Principal  Problem:   Fluid overload Active Problems:   Alcohol abuse with intoxication (HCC)   Leg swelling   Respiratory failure requiring intubation (HCC)   Lack of intravenous access   1. Atrial fibrillation with rapid ventricular response. Echocardiogram shows ejection fraction of 40%.  CHA2DS2-VASc score of 1 at this time.  Does not appear to be a candidate for anticoagulation with low CHA2DS2-VASc score as well as ongoing issues with alcoholism. Initially required cardizem infusion and IV metoprolol. Cardizem had been weaned off but on 8/21, patient developed rapid atrial fibrillation with hypotension.  Heart rate ranging in the 150s to 180s with blood pressure in the 90s.  Discussed with Dr. Haroldine Laws patient started on amiodarone infusion. This initially helped, but on 8/22, he went back into rapid a fib. Started back on cardizem infusion in addition to amiodarone. Metoprolol dose also increased. Will request formal cardiology consult in AM. 2. Acute systolic CHF vs. PNA.  Patient initially presented with shortness of breath and concerns for volume overload. EF found to be 40% on echo. CTA chest negative for pulmonary embolism. Follow up chest xray shows pleural effusion with possible consolidation. CVP of 12-13. Oxygen sat on CO-OX was 74. Will give one dose of lasix. Currently, on intravenous antibiotics. 3. Elevated liver enzymes.  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  Viral hepatitis panel negative, abdominal ultrasound unrevealing.  Discriminant function 12.9, currently not a candidate for steroids.  Transaminases are trending down. 4. Thrombocytopenia.  Suspect this is related to alcohol use.  Improving. Continue to follow. 5. Cellulitis of left lower extremity.  Noted to be erythematous, warm to touch.  Currently on Ancef.  Overall erythema improving 6. Sepsis. Patient was transiently febrile, tachycardic and tachypneic with elevated lactic acid at 4.5. Sources of infection include cellulitis  vs. Possible pneumonia. Lactate has improved to 1.8 with IV fluids, blood cultures in process. Hemodynamics are stabilizing. 7. Alcohol withdrawal with delirium tremens.  Patient started on Precedex for worsening alcohol withdrawal.  Despite maxing out on Precedex, he required restraints and intermittent doses of Ativan.  Despite these measures, patient remained tachypneic, tachycardic.  Respiratory rate in the 40s to 50s, heart rate in the 120s to 130s.  By mental status, he was significantly lethargic.  It appeared that he needed more sedation and would likely benefit from propofol.  He was subsequently intubated for airway support. Patient self extubated on 8/22. Currently appears calm. Will continue ativan per CIWA protocol. 8. Mechanical ventilation for severe delirium tremens and respiratory distress. PCCM consulted to assist with vent management. Patient self extubated on 8/22. Respiratory status currently stable. 9. Cardiomyopathy. EF of 40% on echocardiogram.  Possibly tachycardia induced from atrial fibrillation versus alcohol.  Discussed with Dr. Domenic Polite with recommendations to continue medical management on beta-blockers.  No plans for invasive cardiac work-up at this point.  He can be followed up by cardiology as an outpatient.   DVT prophylaxis: SCDs Start: 01/11/20 2316  Code Status: Full code Family Communication: updated patients father over the phone Disposition Plan: Status is: Inpatient  Remains inpatient appropriate because:IV treatments appropriate due to intensity of illness or inability to take PO   Dispo: The patient is from: Home              Anticipated d/c is to: Home              Anticipated d/c date is: 2 days              Patient currently is not medically stable to d/c.   Consultants:   PCCM  Gen Surg for central line placement  Cardiology  Procedures:   Echo: EF 40% with global hypokinesis  ETT 8/19>8/22  CVC left subclavian  8/21>  Antimicrobials:   Ancef 8/18>   Subjective: During wake up assessment, patient was able to extubate himself. He did not appear to be in any respiratory distress. He was noted to be tachyardic  Objective: Vitals:   01/16/20 1555 01/16/20 1600 01/16/20 1615 01/16/20 1630  BP: (!) 123/94 (!) 139/108 (!) 129/113 (!) 129/55  Pulse: (!) 109 (!) 121 (!) 111 70  Resp: (!) 26 (!) 22 (!) 24 13  Temp:      TempSrc:      SpO2: 98% 99% 99% 99%  Weight:      Height:        Intake/Output Summary (Last 24 hours) at 01/16/2020 1704 Last data filed at 01/16/2020 1521 Gross per 24 hour  Intake 2247.82 ml  Output 901 ml  Net 1346.82 ml   Filed Weights   01/14/20 0513 01/15/20 0345 01/16/20 0500  Weight: 126.3 kg 129.1 kg 130.9 kg    Examination:  General exam: recently extubated, awake, drowsy Respiratory system: Clear to auscultation. Respiratory effort normal. Cardiovascular system:irregular, tachycardic Gastrointestinal system: Abdomen is nondistended, soft and nontender. No organomegaly or masses felt. Normal bowel sounds heard. Central nervous system: No focal neurological deficits. Extremities: no edema bilaterally Skin: erythema in LLE is better Psychiatry: drowsy    Data Reviewed: I have personally reviewed following labs and imaging studies  CBC: Recent Labs  Lab 01/11/20 1914 01/11/20 1956 01/12/20 0339 01/13/20 0822 01/14/20 0505 01/15/20 0421 01/16/20 0524  WBC 4.5   < > 4.1 6.0 8.5 8.1 7.2  NEUTROABS 3.1  --   --   --   --   --   --   HGB 11.7*   < > 10.8* 12.2* 12.2* 11.4* 11.4*  HCT 34.7*   < > 32.8* 36.7* 37.2* 35.0* 35.1*  MCV 105.2*   < > 105.8* 106.1* 107.8* 108.0* 107.7*  PLT 96*   < > 76* 65* 62* 64* 89*   < > = values in this interval not displayed.   Basic Metabolic Panel: Recent Labs  Lab 01/11/20 1914 01/11/20 1956 01/12/20 0339 01/13/20 0822 01/14/20 0505 01/14/20 1701 01/15/20 0421 01/15/20 1710 01/16/20 0524  NA 139   < >  138 136 136  --  136  --  134*  K 4.3   < > 4.2 4.4 3.6  --  3.5  --  3.0*  CL 106   < > 108 102 106  --  106  --  103  CO2 18*   < > 19* 20* 15*  --  21*  --  23  GLUCOSE 120*   < > 119* 160* 113*  --  84  --  115*  BUN 5*   < > 6 8 11   --  13  --  10  CREATININE 0.60*   < > 0.61 0.90 0.84  --  0.55*  --  0.48*  CALCIUM 8.3*   < > 8.1* 8.6* 7.9*  --  7.7*  --  7.8*  MG 2.0  --   --   --   --  1.6* 1.7 1.7 1.8  PHOS 4.4  --   --   --   --  3.2 3.2 3.4  --    < > = values in this interval not displayed.   GFR: Estimated Creatinine Clearance: 177.3 mL/min (A) (by C-G formula based on SCr of 0.48 mg/dL (L)). Liver Function Tests: Recent Labs  Lab 01/12/20 0339 01/13/20 0822 01/14/20 0505 01/15/20 0421 01/16/20 0524  AST 209* 114* 164* 152* 152*  ALT 94* 78* 69* 62* 71*  ALKPHOS 157* 151* 114 112 133*  BILITOT 1.2 2.8* 2.7* 3.0* 2.9*  PROT 6.6 6.9 5.9* 5.5* 5.5*  ALBUMIN 3.3* 3.4* 2.9* 2.4* 2.4*   Recent Labs  Lab 01/11/20 1914 01/12/20 0339 01/13/20 1217  LIPASE 112* 117* 40   Recent Labs  Lab 01/11/20 1914  AMMONIA 62*   Coagulation Profile: Recent Labs  Lab 01/11/20 1914 01/13/20 0822  INR 1.2 1.3*   Cardiac Enzymes: No results for input(s): CKTOTAL, CKMB, CKMBINDEX, TROPONINI in the last 168 hours. BNP (last 3 results) No results for input(s): PROBNP in the last 8760 hours. HbA1C: No results for input(s): HGBA1C in the last 72 hours. CBG: Recent Labs  Lab 01/15/20 1952 01/15/20 2338 01/16/20 0448 01/16/20 0817 01/16/20 1112  GLUCAP 98 109* 136* 108* 131*   Lipid Profile: Recent Labs    01/15/20 0421 01/16/20 0523  TRIG 109 149   Thyroid Function Tests: No results for input(s): TSH, T4TOTAL, FREET4, T3FREE, THYROIDAB in the last 72 hours. Anemia Panel: No results for input(s): VITAMINB12, FOLATE, FERRITIN, TIBC, IRON, RETICCTPCT in the last 72 hours. Sepsis Labs: Recent Labs  Lab 01/11/20 2112 01/12/20 0339 01/13/20 1932 01/14/20 0505   LATICACIDVEN 3.2* 2.0* 4.5* 1.8    Recent Results (from  the past 240 hour(s))  SARS Coronavirus 2 by RT PCR (hospital order, performed in [facility] hospital lab) Nasopharyngeal Nasopharyngeal Swab     Status: None   Collection Time: 01/11/20  7:01 PM   Specimen: Nasopharyngeal Swab  Result Value Ref Range Status   SARS Coronavirus 2 NEGATIVE NEGATIVE Final    Comment: (NOTE) SARS-CoV-2 target nucleic acids are NOT DETECTED.  The SARS-CoV-2 RNA is generally detectable in upper and lower respiratory specimens during the acute phase of infection. The lowest concentration of SARS-CoV-2 viral copies this assay can detect is 250 copies / mL. A negative result does not preclude SARS-CoV-2 infection and should not be used as the sole basis for treatment or other patient management decisions.  A negative result may occur with improper specimen collection / handling, submission of specimen other than nasopharyngeal swab, presence of viral mutation(s) within the areas targeted by this assay, and inadequate number of viral copies (<250 copies / mL). A negative result must be combined with clinical observations, patient history, and epidemiological information.  Fact Sheet for Patients:   StrictlyIdeas.no  Fact Sheet for Healthcare Providers: BankingDealers.co.za  This test is not yet approved or  cleared by the [place] FDA and has been authorized for detection and/or diagnosis of SARS-CoV-2 by FDA under an Emergency Use Authorization (EUA).  This EUA will remain in effect (meaning this test can be used) for the duration of the COVID-19 declaration under Section 564(b)(1) of the Act, 21 U.S.C. section 360bbb-3(b)(1), unless the authorization is terminated or revoked sooner.  Performed at [facility], [address], [place], [place] [address]   Culture, blood (routine x 2)     Status: None   Collection Time: 01/11/20  7:14 PM    Specimen: BLOOD RIGHT ARM  Result Value Ref Range Status   Specimen Description BLOOD RIGHT ARM  Final   Special Requests   Final    BOTTLES DRAWN AEROBIC AND ANAEROBIC Blood Culture adequate volume   Culture   Final    NO GROWTH 5 DAYS Performed at [facility], [address], [place], [place] [address]    Report Status 01/16/2020 FINAL  Final  Culture, blood (routine x 2)     Status: Abnormal   Collection Time: 01/11/20  7:24 PM   Specimen: BLOOD RIGHT FOREARM  Result Value Ref Range Status   Specimen Description   Final    BLOOD RIGHT FOREARM Performed at [facility], [address], [place], [place] [address]    Special Requests   Final    BOTTLES DRAWN AEROBIC AND ANAEROBIC Blood Culture adequate volume Performed at [facility], [address], [place], [place] [address]    Culture  Setup Time   Final    GRAM POSITIVE COCCI aerobic bottle Gram Stain Report Called to,Read Back By and Verified With: HILTON, L@ 3235 by MATTHEWS, B 8.18.21 [facility] Organism ID to follow CRITICAL RESULT CALLED TO, READ BACK BY AND VERIFIED WITH: H TETREAULT RN 01/12/20 2254 JDW    Culture (A)  Final    STAPHYLOCOCCUS HOMINIS THE SIGNIFICANCE OF ISOLATING THIS ORGANISM FROM A SINGLE SET OF BLOOD CULTURES WHEN MULTIPLE SETS ARE DRAWN IS UNCERTAIN. PLEASE NOTIFY THE MICROBIOLOGY DEPARTMENT WITHIN ONE WEEK IF SPECIATION AND SENSITIVITIES ARE REQUIRED. Performed at [facility], [place] [address] [place],  [address]    Report Status 01/13/2020 FINAL  Final  Blood Culture ID Panel (Reflexed)     Status: Abnormal  Collection Time: 01/11/20  7:24 PM  Result Value Ref Range Status   Enterococcus faecalis NOT DETECTED NOT DETECTED Final   Enterococcus Faecium NOT DETECTED NOT DETECTED Final   Listeria monocytogenes NOT DETECTED NOT DETECTED Final   Staphylococcus species DETECTED (A) NOT DETECTED Final    Comment: CRITICAL RESULT CALLED TO, READ BACK BY AND VERIFIED WITH: H  TETREAULT RN 01/12/20 2254 JDW    Staphylococcus aureus (BCID) NOT DETECTED NOT DETECTED Final   Staphylococcus epidermidis NOT DETECTED NOT DETECTED Final   Staphylococcus lugdunensis NOT DETECTED NOT DETECTED Final   Streptococcus species NOT DETECTED NOT DETECTED Final   Streptococcus agalactiae NOT DETECTED NOT DETECTED Final   Streptococcus pneumoniae NOT DETECTED NOT DETECTED Final   Streptococcus pyogenes NOT DETECTED NOT DETECTED Final   A.calcoaceticus-baumannii NOT DETECTED NOT DETECTED Final   Bacteroides fragilis NOT DETECTED NOT DETECTED Final   Enterobacterales NOT DETECTED NOT DETECTED Final   Enterobacter cloacae complex NOT DETECTED NOT DETECTED Final   Escherichia coli NOT DETECTED NOT DETECTED Final   Klebsiella aerogenes NOT DETECTED NOT DETECTED Final   Klebsiella oxytoca NOT DETECTED NOT DETECTED Final   Klebsiella pneumoniae NOT DETECTED NOT DETECTED Final   Proteus species NOT DETECTED NOT DETECTED Final   Salmonella species NOT DETECTED NOT DETECTED Final   Serratia marcescens NOT DETECTED NOT DETECTED Final   Haemophilus influenzae NOT DETECTED NOT DETECTED Final   Neisseria meningitidis NOT DETECTED NOT DETECTED Final   Pseudomonas aeruginosa NOT DETECTED NOT DETECTED Final   Stenotrophomonas maltophilia NOT DETECTED NOT DETECTED Final   Candida albicans NOT DETECTED NOT DETECTED Final   Candida auris NOT DETECTED NOT DETECTED Final   Candida glabrata NOT DETECTED NOT DETECTED Final   Candida krusei NOT DETECTED NOT DETECTED Final   Candida parapsilosis NOT DETECTED NOT DETECTED Final   Candida tropicalis NOT DETECTED NOT DETECTED Final   Cryptococcus neoformans/gattii NOT DETECTED NOT DETECTED Final    Comment: Performed at [facility], [address] [place], [place] [address]  MRSA PCR Screening     Status: None   Collection Time: 01/12/20  6:45 AM   Specimen: Nasal Mucosa; Nasopharyngeal  Result Value Ref Range Status   MRSA by PCR NEGATIVE  NEGATIVE Final    Comment:        The GeneXpert MRSA Assay (FDA approved for NASAL specimens only), is one component of a comprehensive MRSA colonization surveillance program. It is not intended to diagnose MRSA infection nor to guide or monitor treatment for MRSA infections. Performed at [facility], [address], [place], [place] [address]   Culture, blood (routine x 2)     Status: None (Preliminary result)   Collection Time: 01/13/20 12:16 PM   Specimen: BLOOD  Result Value Ref Range Status   Specimen Description BLOOD LEFT ANTECUBITAL  Final   Special Requests   Final    BOTTLES DRAWN AEROBIC AND ANAEROBIC Blood Culture adequate volume   Culture   Final    NO GROWTH 3 DAYS Performed at [facility], [address] [place], [place] [address]    Report Status PENDING  Incomplete  Culture, blood (routine x 2)     Status: None (Preliminary result)   Collection Time: 01/13/20 12:16 PM   Specimen: BLOOD  Result Value Ref Range Status   Specimen Description BLOOD LEFT HAND  Final   Special Requests   Final    BOTTLES DRAWN AEROBIC AND ANAEROBIC Blood Culture adequate volume   Culture  Final    NO GROWTH 3 DAYS Performed at [facility], [address], [place], [place] [address]    Report Status PENDING  Incomplete  Culture, blood (routine x 2)     Status: None (Preliminary result)   Collection Time: 01/14/20  4:56 AM   Specimen: BLOOD LEFT HAND  Result Value Ref Range Status   Specimen Description BLOOD LEFT HAND  Final   Special Requests   Final    BOTTLES DRAWN AEROBIC AND ANAEROBIC Blood Culture adequate volume   Culture   Final    NO GROWTH 2 DAYS Performed at [facility], [address], [place], [place] [address]    Report Status PENDING  Incomplete  Culture, blood (routine x 2)     Status: None (Preliminary result)   Collection Time: 01/14/20  5:05 AM   Specimen: BLOOD LEFT HAND  Result Value Ref Range Status   Specimen Description BLOOD LEFT HAND  Final    Special Requests   Final    BOTTLES DRAWN AEROBIC AND ANAEROBIC Blood Culture adequate volume   Culture   Final    NO GROWTH 2 DAYS Performed at [facility], [address], [place], [place] [address]    Report Status PENDING  Incomplete         Radiology Studies: DG CHEST PORT 1 VIEW  Result Date: 01/16/2020 CLINICAL DATA:  29 year old male with history of acute respiratory failure. EXAM: PORTABLE CHEST 1 VIEW COMPARISON:  Chest x-ray 01/15/2020. FINDINGS: An endotracheal tube is in place with tip 4.4 cm above the carina. There is a left-sided subclavian central venous catheter with tip terminating in the proximal superior vena cava. A nasogastric tube is seen extending into the stomach, however, the tip of the nasogastric tube extends below the lower margin of the image. Lung volumes are low. Bibasilar opacities may reflect areas of atelectasis and/or consolidation. Small bilateral pleural effusions (right greater than left). Heart size is mildly enlarged. Upper mediastinal contours are within normal limits. IMPRESSION: 1. Support apparatus, as above. 2. Persistent bibasilar areas of atelectasis and/or consolidation with superimposed small bilateral pleural effusions. Electronically Signed   By: Vinnie Langton M.D.   On: 01/16/2020 06:09   DG CHEST PORT 1 VIEW  Result Date: 01/15/2020 CLINICAL DATA:  Central line placement EXAM: PORTABLE CHEST 1 VIEW COMPARISON:  January 15, 2020 FINDINGS: A new left central line terminates in the region the SVC. No pneumothorax. The ETT is in good position. The NG tube terminates below today's film. A right-sided pleural effusion is stable. Bilateral pulmonary opacities, right greater than left are stable. Probable small layering effusion. The cardiomediastinal silhouette is stable. No other acute abnormalities. IMPRESSION: 1. Small bilateral effusions, right greater than left. 2. Support apparatus as above. No pneumothorax after left central line placement.  3. Right greater than left pulmonary opacities remain, mildly improved in the interval. Recommend attention on follow-up. Electronically Signed   By: Dorise Bullion III M.D   On: 01/15/2020 10:48   DG CHEST PORT 1 VIEW  Result Date: 01/15/2020 CLINICAL DATA:  29 year old male with history of acute respiratory failure. EXAM: PORTABLE CHEST 1 VIEW COMPARISON:  Chest x-ray 01/14/2020. FINDINGS: An endotracheal tube is in place with tip 3.8 cm above the carina. A nasogastric tube is seen extending into the stomach, however, the tip of the nasogastric tube extends below the lower margin of the image. Lung volumes are low. Moderate right and small left pleural effusions. Bibasilar opacities (right greater than  left), with definite air bronchograms on the right. No pneumothorax. Heart size is mildly enlarged. Upper mediastinal contours are within normal limits allowing for patient positioning. IMPRESSION: 1. Support apparatus, as above. 2. Airspace consolidation in the right lower lobe concerning for pneumonia. Additional left basilar atelectasis and/or consolidation. 3. Enlarging moderate right and small left pleural effusions. Electronically Signed   By: Vinnie Langton M.D.   On: 01/15/2020 06:33   DG CHEST PORT 1 VIEW  Result Date: 01/14/2020 CLINICAL DATA:  Intubation EXAM: PORTABLE CHEST 1 VIEW COMPARISON:  Earlier same day FINDINGS: Endotracheal tube tip is 6 cm above the carina. Orogastric or nasogastric tube enters the stomach. Cardiomegaly, bilateral effusions and pulmonary edema appear similar to the previous study. IMPRESSION: Lines and tubes well positioned. Heart failure pattern as seen previously. Electronically Signed   By: Nelson Chimes M.D.   On: 01/14/2020 21:48   [place] EKG SITE RITE  Result Date: 01/15/2020 If Site Rite image not attached, placement could not be confirmed due to current cardiac rhythm.       Scheduled Meds: . chlorhexidine gluconate (MEDLINE KIT)  15 mL Mouth Rinse BID   . Chlorhexidine Gluconate Cloth  6 each Topical Daily  . docusate  100 mg Per Tube BID  . feeding supplement (PROSource TF)  90 mL Per Tube TID  . feeding supplement (VITAL HIGH PROTEIN)  1,000 mL Per Tube Q24H  . folic acid  1 mg Per Tube Daily  . furosemide  20 mg Intravenous Once  . mouth rinse  15 mL Mouth Rinse 10 times per day  . metoprolol tartrate  25 mg Oral NOW  . metoprolol tartrate  50 mg Per Tube BID  . multivitamin  15 mL Per Tube Daily  . nicotine  21 mg Transdermal Daily  . pantoprazole sodium  40 mg Per Tube Q24H  . polyethylene glycol  17 g Oral Daily  . sodium chloride flush  10-40 mL Intracatheter Q12H  . thiamine  100 mg Per Tube Daily   Or  . thiamine  100 mg Intravenous Daily   Continuous Infusions: . amiodarone 30 mg/hr (01/16/20 1052)  .  ceFAZolin (ANCEF) IV 2 g (01/16/20 1323)  . diltiazem (CARDIZEM) infusion 15 mg/hr (01/16/20 1643)  . potassium chloride 10 mEq (01/16/20 1643)     LOS: 5 days    Critical care procedure note Authorized and performed by: Kathie Dike Total critical care time: Approximately 35 minutes Due to high probability of clinically significant, life-threatening deterioration, the patient required my highest level of preparedness to intervene emergently and I personally spent this critical care time directly and personally managing the patient.  The critical care time included obtaining a history, examining the patient, pulse oximetry, ordering and review of studies, arranging urgent treatment with development of a management plan, evaluation of patient's response to treatment, frequent reassessment, discussions with other providers.  Critical care time was performed to assess and manage the high probability of imminent, life-threatening deterioration that could result in multiorgan failure.  It was exclusive of separate billable procedures and treating other patients and teaching time.  Please see MDM section and the rest of the of  note for further information on patient assessment and treatment     Kathie Dike, MD [facility]   If 7PM-7AM, please contact night-coverage [email or web address]  01/16/2020, 5:04 PM

## 2020-01-17 DIAGNOSIS — F10239 Alcohol dependence with withdrawal, unspecified: Secondary | ICD-10-CM

## 2020-01-17 DIAGNOSIS — I5021 Acute systolic (congestive) heart failure: Secondary | ICD-10-CM

## 2020-01-17 DIAGNOSIS — I426 Alcoholic cardiomyopathy: Secondary | ICD-10-CM

## 2020-01-17 LAB — GLUCOSE, CAPILLARY
Glucose-Capillary: 104 mg/dL — ABNORMAL HIGH (ref 70–99)
Glucose-Capillary: 111 mg/dL — ABNORMAL HIGH (ref 70–99)
Glucose-Capillary: 100 mg/dL — ABNORMAL HIGH (ref 70–99)
Glucose-Capillary: 105 mg/dL — ABNORMAL HIGH (ref 70–99)
Glucose-Capillary: 97 mg/dL (ref 70–99)

## 2020-01-17 LAB — COMPREHENSIVE METABOLIC PANEL
ALT: 63 U/L — ABNORMAL HIGH (ref 0–44)
AST: 111 U/L — ABNORMAL HIGH (ref 15–41)
Albumin: 2.6 g/dL — ABNORMAL LOW (ref 3.5–5.0)
Alkaline Phosphatase: 127 U/L — ABNORMAL HIGH (ref 38–126)
Anion gap: 9 (ref 5–15)
BUN: 5 mg/dL — ABNORMAL LOW (ref 6–20)
CO2: 24 mmol/L (ref 22–32)
Calcium: 8 mg/dL — ABNORMAL LOW (ref 8.9–10.3)
Chloride: 101 mmol/L (ref 98–111)
Creatinine, Ser: 0.43 mg/dL — ABNORMAL LOW (ref 0.61–1.24)
GFR calc Af Amer: >60 mL/min (ref >60)
GFR calc non Af Amer: >60 mL/min (ref >60)
Glucose, Bld: 103 mg/dL — ABNORMAL HIGH (ref 70–99)
Potassium: 3.2 mmol/L — ABNORMAL LOW (ref 3.5–5.1)
Sodium: 134 mmol/L — ABNORMAL LOW (ref 135–145)
Total Bilirubin: 3 mg/dL — ABNORMAL HIGH (ref 0.3–1.2)
Total Protein: 5.8 g/dL — ABNORMAL LOW (ref 6.5–8.1)

## 2020-01-17 LAB — CBC
HCT: 33.6 % — ABNORMAL LOW (ref 39.0–52.0)
Hemoglobin: 11.3 g/dL — ABNORMAL LOW (ref 13.0–17.0)
MCH: 35.5 pg — ABNORMAL HIGH (ref 26.0–34.0)
MCHC: 33.6 g/dL (ref 30.0–36.0)
MCV: 105.7 fL — ABNORMAL HIGH (ref 80.0–100.0)
Platelets: 100 10*3/uL — ABNORMAL LOW (ref 150–400)
RBC: 3.18 MIL/uL — ABNORMAL LOW (ref 4.22–5.81)
RDW: 15.7 % — ABNORMAL HIGH (ref 11.5–15.5)
WBC: 5.5 10*3/uL (ref 4.0–10.5)
nRBC: 0 % (ref 0.0–0.2)

## 2020-01-17 MED ORDER — POTASSIUM CHLORIDE CRYS ER 20 MEQ PO TBCR
40.0000 meq | EXTENDED_RELEASE_TABLET | Freq: Once | ORAL | Status: AC
  Administered 2020-01-17: 40 meq via ORAL
  Filled 2020-01-17: qty 2

## 2020-01-17 MED ORDER — DOCUSATE SODIUM 100 MG PO CAPS
100.0000 mg | ORAL_CAPSULE | Freq: Two times a day (BID) | ORAL | Status: DC
  Administered 2020-01-17 – 2020-01-22 (×4): 100 mg via ORAL
  Filled 2020-01-17 (×8): qty 1

## 2020-01-17 MED ORDER — ACETAMINOPHEN 325 MG PO TABS
650.0000 mg | ORAL_TABLET | Freq: Four times a day (QID) | ORAL | Status: DC | PRN
  Filled 2020-01-17: qty 2

## 2020-01-17 MED ORDER — ADULT MULTIVITAMIN W/MINERALS CH
1.0000 | ORAL_TABLET | Freq: Every day | ORAL | Status: DC
  Administered 2020-01-17 – 2020-01-22 (×6): 1 via ORAL
  Filled 2020-01-17 (×6): qty 1

## 2020-01-17 MED ORDER — THIAMINE HCL 100 MG/ML IJ SOLN
100.0000 mg | Freq: Every day | INTRAMUSCULAR | Status: DC
  Filled 2020-01-17: qty 2

## 2020-01-17 MED ORDER — TRAMADOL HCL 50 MG PO TABS
50.0000 mg | ORAL_TABLET | Freq: Four times a day (QID) | ORAL | Status: DC | PRN
  Administered 2020-01-18 – 2020-01-21 (×7): 50 mg via ORAL
  Filled 2020-01-17 (×7): qty 1

## 2020-01-17 MED ORDER — FOLIC ACID 1 MG PO TABS
1.0000 mg | ORAL_TABLET | Freq: Every day | ORAL | Status: DC
  Administered 2020-01-17 – 2020-01-22 (×6): 1 mg via ORAL
  Filled 2020-01-17 (×6): qty 1

## 2020-01-17 MED ORDER — THIAMINE HCL 100 MG PO TABS
100.0000 mg | ORAL_TABLET | Freq: Every day | ORAL | Status: DC
  Administered 2020-01-17 – 2020-01-22 (×6): 100 mg via ORAL
  Filled 2020-01-17 (×6): qty 1

## 2020-01-17 MED FILL — Medication: Qty: 1 | Status: AC

## 2020-01-17 NOTE — Consult Note (Addendum)
Cardiology Consultation:   Patient ID: Omar Mccarthy MRN: [id number]; DOB: 1990/08/26  Admit date: 01/11/2020 Date of Consult: 01/17/2020  Primary Care Provider: [facility] Cardiologist: [facility] Electrophysiologist:  None    Patient Profile:   Omar Mccarthy is a 29 y.o. male with a hx of HTN, EtOH abuse, fatty liver  who is being seen today for the evaluation of SOB at the request of Dr Kerry Hough.  History of Present Illness:   Omar Mccarthy 29 yo male history of HTN, EtOH abuse admitted with SOB and leg edema. Self reported 30 lbs weight gain at home. Patient drinks 24-26 beers daily or drinks half a gallon of vodka daily. During admission developed significant EtOH withdrawal requiring aggressive medical therapy and intubation for airway protection. Management complicated by afib with RVR, caridology consulted to help manage.    Admit labs WBC 4.5 Hgb 11.7 Plt 96 K 4.3 Cr 0.6 AST 203 ALT 102 BNP 279 Ammonia 62 Lipase 112 TSH 3.8 COVID neg hstrop 24-->23 COOX 95%-->77% CXR no acute process RUQ [place]: hepatic steatosis CT PE: no PE, severe fatty liver, cardiomegaly. EKG afib with RVR  12/2019 echo LVEF 40%, mod RV dysfunction, PASP 29,  Past Medical History:  Diagnosis Date   Hypertension     History reviewed. No pertinent surgical history.    Inpatient Medications: Scheduled Meds:  chlorhexidine  15 mL Mouth Rinse BID   Chlorhexidine Gluconate Cloth  6 each Topical Daily   docusate sodium  100 mg Oral BID   folic acid  1 mg Oral Daily   mouth rinse  15 mL Mouth Rinse q12n4p   metoprolol tartrate  50 mg Oral BID   multivitamin with minerals  1 tablet Oral Daily   nicotine  21 mg Transdermal Daily   pantoprazole sodium  40 mg Oral Q24H   polyethylene glycol  17 g Oral Daily   sodium chloride flush  10-40 mL Intracatheter Q12H   thiamine  100 mg Oral Daily   Or   thiamine  100 mg Intravenous Daily    Continuous Infusions:  amiodarone 30 mg/hr (01/17/20 0542)    ceFAZolin (ANCEF) IV Stopped (01/17/20 0502)   diltiazem (CARDIZEM) infusion 15 mg/hr (01/17/20 0542)   PRN Meds: acetaminophen, acetaminophen, glycopyrrolate, iohexol, LORazepam, ondansetron **OR** ondansetron (ZOFRAN) IV, phenol, sodium chloride flush, traMADol  Allergies:   No Known Allergies  Social History:   Social History   Socioeconomic History   Marital status: Married    Spouse name: Not on file   Number of children: Not on file   Years of education: Not on file   Highest education level: Not on file  Occupational History   Not on file  Tobacco Use   Smoking status: Current Every Day Smoker   Smokeless tobacco: Never Used  Substance and Sexual Activity   Alcohol use: Not on file    Comment: heavily daily   Drug use: Never   Sexual activity: Not on file  Other Topics Concern   Not on file  Social History Narrative   Not on file   Social Determinants of Health   Financial Resource Strain:    Difficulty of Paying Living Expenses: Not on file  Food Insecurity:    Worried About Running Out of Food in the Last Year: Not on file   Ran Out of Food in the Last Year: Not on file  Transportation Needs:    Lack of Transportation (Medical): Not  on file   Lack of Transportation (Non-Medical): Not on file  Physical Activity:    Days of Exercise per Week: Not on file   Minutes of Exercise per Session: Not on file  Stress:    Feeling of Stress : Not on file  Social Connections:    Frequency of Communication with Friends and Family: Not on file   Frequency of Social Gatherings with Friends and Family: Not on file   Attends Religious Services: Not on file   Active Member of Clubs or Organizations: Not on file   Attends Banker Meetings: Not on file   Marital Status: Not on file  Intimate Partner Violence:    Fear of Current or Ex-Partner: Not on file    Emotionally Abused: Not on file   Physically Abused: Not on file   Sexually Abused: Not on file    Family History:   No family history on file.   ROS:  Please see the history of present illness.  All other ROS reviewed and negative.     Physical Exam/Data:   Vitals:   01/17/20 0645 01/17/20 0722 01/17/20 0800 01/17/20 0830  BP: 123/75  116/81 100/68  Pulse: (!) 101 83 96   Resp: (!) 28 (!) 26 (!) 28   Temp:  98.8 F (37.1 C)    TempSrc:  Oral    SpO2: 94% 95% 97%   Weight:      Height:        Intake/Output Summary (Last 24 hours) at 01/17/2020 0910 Last data filed at 01/17/2020 0542 Gross per 24 hour  Intake 2961.69 ml  Output 3901 ml  Net -939.31 ml   Last 3 Weights 01/17/2020 01/16/2020 01/15/2020  Weight (lbs) 284 lb 6.3 oz 288 lb 9.3 oz 284 lb 9.8 oz  Weight (kg) 129 kg 130.9 kg 129.1 kg     Body mass index is 44.54 kg/m.  General:  Well nourished, well developed, in no acute distress HEENT: normal Lymph: no adenopathy Neck: no JVD Endocrine:  No thryomegaly Vascular: No carotid bruits; FA pulses 2+ bilaterally without bruits  Cardiac:  irreg Lungs:  clear to auscultation bilaterally, no wheezing, rhonchi or rales  Abd: soft, nontender, no hepatomegaly  Ext: no edema Musculoskeletal:  No deformities, BUE and BLE strength normal and equal Skin: warm and dry  Neuro:  Sedated but follows commands Psych:  Cannot assess, sedated    Laboratory Data:  High Sensitivity Troponin:   Recent Labs  Lab 01/11/20 1914 01/11/20 2112  TROPONINIHS 24* 23*     Chemistry Recent Labs  Lab 01/14/20 0505 01/15/20 0421 01/16/20 0524  NA 136 136 134*  K 3.6 3.5 3.0*  CL 106 106 103  CO2 15* 21* 23  GLUCOSE 113* 84 115*  BUN CREATININE 0.84 0.55* 0.48*  CALCIUM 7.9* 7.7* 7.8*  GFRNONAA >60 >60 >60  GFRAA >60 >60 >60  ANIONGAP Recent Labs  Lab 01/14/20 0505 01/15/20 0421 01/16/20 0524  PROT 5.9* 5.5* 5.5*  ALBUMIN 2.9* 2.4* 2.4*  AST  164* 152* 152*  ALT 69* 62* 71*  ALKPHOS 114 112 133*  BILITOT 2.7* 3.0* 2.9*   Hematology Recent Labs  Lab 01/14/20 0505 01/15/20 0421 01/16/20 0524  WBC 8.5 8.1 7.2  RBC 3.45* 3.24* 3.26*  HGB 12.2* 11.4* 11.4*  HCT 37.2* 35.0* 35.1*  MCV 107.8* 108.0* 107.7*  MCH 35.4* 35.2* 35.0*  MCHC 32.8 32.6 32.5  RDW  16.2* 16.1* 16.3*  PLT 62* 64* 89*   BNP Recent Labs  Lab 01/11/20 1914  BNP 279.0*    DDimer No results for input(s): DDIMER in the last 168 hours.   Radiology/Studies:  DG CHEST PORT 1 VIEW  Result Date: 01/16/2020 CLINICAL DATA:  29 year old male with history of acute respiratory failure. EXAM: PORTABLE CHEST 1 VIEW COMPARISON:  Chest x-ray 01/15/2020. FINDINGS: An endotracheal tube is in place with tip 4.4 cm above the carina. There is a left-sided subclavian central venous catheter with tip terminating in the proximal superior vena cava. A nasogastric tube is seen extending into the stomach, however, the tip of the nasogastric tube extends below the lower margin of the image. Lung volumes are low. Bibasilar opacities may reflect areas of atelectasis and/or consolidation. Small bilateral pleural effusions (right greater than left). Heart size is mildly enlarged. Upper mediastinal contours are within normal limits. IMPRESSION: 1. Support apparatus, as above. 2. Persistent bibasilar areas of atelectasis and/or consolidation with superimposed small bilateral pleural effusions. Electronically Signed   By: Trudie Reed M.D.   On: 01/16/2020 06:09   DG CHEST PORT 1 VIEW  Result Date: 01/15/2020 CLINICAL DATA:  Central line placement EXAM: PORTABLE CHEST 1 VIEW COMPARISON:  January 15, 2020 FINDINGS: A new left central line terminates in the region the SVC. No pneumothorax. The ETT is in good position. The NG tube terminates below today's film. A right-sided pleural effusion is stable. Bilateral pulmonary opacities, right greater than left are stable. Probable small layering  effusion. The cardiomediastinal silhouette is stable. No other acute abnormalities. IMPRESSION: 1. Small bilateral effusions, right greater than left. 2. Support apparatus as above. No pneumothorax after left central line placement. 3. Right greater than left pulmonary opacities remain, mildly improved in the interval. Recommend attention on follow-up. Electronically Signed   By: Gerome Sam III M.D   On: 01/15/2020 10:48   DG CHEST PORT 1 VIEW  Result Date: 01/15/2020 CLINICAL DATA:  29 year old male with history of acute respiratory failure. EXAM: PORTABLE CHEST 1 VIEW COMPARISON:  Chest x-ray 01/14/2020. FINDINGS: An endotracheal tube is in place with tip 3.8 cm above the carina. A nasogastric tube is seen extending into the stomach, however, the tip of the nasogastric tube extends below the lower margin of the image. Lung volumes are low. Moderate right and small left pleural effusions. Bibasilar opacities (right greater than left), with definite air bronchograms on the right. No pneumothorax. Heart size is mildly enlarged. Upper mediastinal contours are within normal limits allowing for patient positioning. IMPRESSION: 1. Support apparatus, as above. 2. Airspace consolidation in the right lower lobe concerning for pneumonia. Additional left basilar atelectasis and/or consolidation. 3. Enlarging moderate right and small left pleural effusions. Electronically Signed   By: Trudie Reed M.D.   On: 01/15/2020 06:33   DG CHEST PORT 1 VIEW  Result Date: 01/14/2020 CLINICAL DATA:  Intubation EXAM: PORTABLE CHEST 1 VIEW COMPARISON:  Earlier same day FINDINGS: Endotracheal tube tip is 6 cm above the carina. Orogastric or nasogastric tube enters the stomach. Cardiomegaly, bilateral effusions and pulmonary edema appear similar to the previous study. IMPRESSION: Lines and tubes well positioned. Heart failure pattern as seen previously. Electronically Signed   By: Paulina Fusi M.D.   On: 01/14/2020 21:48    Portable Chest xray  Result Date: 01/14/2020 CLINICAL DATA:  Hypoxia EXAM: PORTABLE CHEST 1 VIEW COMPARISON:  January 13, 2020. FINDINGS: Endotracheal tube tip is 5.4 cm above the carina. Nasogastric tube  tip and side port are below the diaphragm. No pneumothorax. There are pleural effusions bilaterally which appear to be partially layering. There is consolidation in both lung bases. No new opacity evident. There is cardiomegaly with a degree of pulmonary venous hypertension. No adenopathy evident. No bone lesions. IMPRESSION: Tube positions as described without pneumothorax. Cardiomegaly with pulmonary vascular congestion and pleural effusion. Suspect a degree of congestive heart failure. Airspace consolidation is noted in both lung bases, primarily medially. Suspect combination of atelectasis and pneumonia in these areas. A degree of alveolar edema may also be present in these areas. The appearance of the lungs is similar to 1 day prior. Electronically Signed   By: Bretta Bang III M.D.   On: 01/14/2020 08:07   DG CHEST PORT 1 VIEW  Result Date: 01/13/2020 CLINICAL DATA:  Post intubation EXAM: PORTABLE CHEST 1 VIEW COMPARISON:  01/11/2020, CT 01/12/2020 FINDINGS: Cardiomegaly with vascular congestion and hazy perihilar opacities suspicious for mild pulmonary edema. At least small bilateral effusions. Endotracheal tube tip is about 4.3 cm superior to carina. Esophageal tube tip is below the diaphragm but is incompletely visualized. IMPRESSION: 1. Cardiomegaly with vascular congestion and hazy perihilar opacities suspicious for mild pulmonary edema. At least small bilateral effusions. 2. Endotracheal tube tip about 4.3 cm superior to carina. Electronically Signed   By: Jasmine Pang M.D.   On: 01/13/2020 20:17   ECHOCARDIOGRAM COMPLETE  Result Date: 01/13/2020    ECHOCARDIOGRAM REPORT   Patient Name:   Omar Mccarthy Date of Exam: 01/13/2020 Medical Rec #:  [id number]        Height:       67.0 in  Accession #:    [id number]       Weight:       277.3 lb Date of Birth:  13-Nov-1990        BSA:          2.324 m Patient Age:    28 years         BP:           141/117 mmHg Patient Gender: M                HR:           95 bpm. [facility]am [facility] Procedure: Intracardiac Opacification Agent Indications:    Leg swelling [[id number]  History:        Patient has no prior history of Echocardiogram examinations.                 Risk Factors:Current Smoker. ETOH, Leg Swelling.  Sonographer:    Jeryl Columbia RDCS (AE) Referring Phys: [id number] [facility] IMPRESSIONS  1. Left ventricular ejection fraction, by estimation, is approximately 40%. The left ventricle has moderately decreased function. The left ventricle demonstrates global hypokinesis with some regionla variation. There is moderate left ventricular hypertrophy. Left ventricular diastolic parameters are indeterminate in the setting of atrial fibrillation. No obvious LV mural thrombus.  2. Right ventricular systolic function is moderately reduced. The right ventricular size is normal. There is normal pulmonary artery systolic pressure. The estimated right ventricular systolic pressure is 28.8 mmHg.  3. The mitral valve is grossly normal. Mild mitral valve regurgitation.  4. The aortic valve is tricuspid. Aortic valve regurgitation is not visualized.  5. The inferior vena cava is dilated in size with <50% respiratory variability, suggesting right atrial pressure of 15 mmHg. FINDINGS  Left Ventricle: Left ventricular ejection fraction, by estimation, is 40%.  The left ventricle has moderately decreased function. The left ventricle demonstrates global hypokinesis. Definity contrast agent was given IV to delineate the left ventricular endocardial borders. The left ventricular internal cavity size was normal in size. There is moderate left ventricular hypertrophy. Left ventricular diastolic parameters are indeterminate. Right Ventricle: The right ventricular  size is normal. No increase in right ventricular wall thickness. Right ventricular systolic function is moderately reduced. There is normal pulmonary artery systolic pressure. The tricuspid regurgitant velocity is 1.86 m/s, and with an assumed right atrial pressure of 15 mmHg, the estimated right ventricular systolic pressure is 28.8 mmHg. Left Atrium: Left atrial size was normal in size. Right Atrium: Right atrial size was normal in size. Pericardium: There is no evidence of pericardial effusion. Mitral Valve: The mitral valve is grossly normal. Mild mitral valve regurgitation. Tricuspid Valve: The tricuspid valve is grossly normal. Tricuspid valve regurgitation is trivial. Aortic Valve: The aortic valve is tricuspid. Aortic valve regurgitation is not visualized. Pulmonic Valve: The pulmonic valve was grossly normal. Pulmonic valve regurgitation is trivial. Aorta: The aortic root is normal in size and structure. Venous: The inferior vena cava is dilated in size with less than 50% respiratory variability, suggesting right atrial pressure of 15 mmHg. IAS/Shunts: No atrial level shunt detected by color flow Doppler.  LEFT VENTRICLE PLAX 2D LVIDd:         3.70 cm  Diastology LVIDs:         2.93 cm  LV e' lateral:   12.90 cm/s LV PW:         1.53 cm  LV E/e' lateral: 8.9 LV IVS:        1.43 cm  LV e' medial:    8.49 cm/s LVOT diam:     2.10 cm  LV E/e' medial:  13.5 LVOT Area:     3.46 cm  RIGHT VENTRICLE RV S prime:     9.57 cm/s TAPSE (M-mode): 1.1 cm LEFT ATRIUM           Index       RIGHT ATRIUM           Index LA diam:      4.70 cm 2.02 cm/m  RA Area:     22.10 cm LA Vol (A2C): 52.7 ml 22.68 ml/m RA Volume:   72.10 ml  31.03 ml/m LA Vol (A4C): 49.8 ml 21.43 ml/m   AORTA Ao Root diam: 2.70 cm MITRAL VALVE                TRICUSPID VALVE MV Area (PHT): 7.51 cm     TR Peak grad:   13.8 mmHg MV Decel Time: 101 msec     TR Vmax:        186.00 cm/s MV E velocity: 115.00 cm/s MV A velocity: 37.20 cm/s   SHUNTS MV  E/A ratio:  3.09         Systemic Diam: 2.10 cm Nona Dell MD Electronically signed by Nona Dell MD Signature Date/Time: 01/13/2020/12:47:33 PM    Final    [place] EKG SITE RITE  Result Date: 01/15/2020 If Site Rite image not attached, placement could not be confirmed due to current cardiac rhythm.  {   Assessment and Plan:   1. Afib with RVR - new diagnsois this admission - difficult rate control this admission, has been on amio, dilt gtt, IV lopressor CHADS2Vasc score is 2 given HTN and HF, poor anticoag candidate with EtOH abuse and thrombocytopenia  - rates difficult  to control likely from severe systemic stress with sepsis, severe EtOH withdrawal - rates 80s-110s today, continue amio and dilt gtt. Likely can convert to oral tomorrow as he continues to wake up.    2. Acute systolic HF - echo LVEF 40%, mod RV dysfunction - COOX 74%, CVP 5-10 - neg yesterday. Good uop with 3.9L but high intake with drips. Received lasix just 20mg  x 1 yestarday Self extubated, sats stable on room air. No further diuresis today.  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 Continue medical therapy, start oral regimen likely tomorrow pending mental status.    3. Sepsis - per primary team 1. Sources of infection include cellulitis vs. Possible pneumonia.   4. Respiratory failure - secondary to severe EtOH withdrawal, intubated for airway protection.  - self extubated yesterday, doing well on room air today.   5. EtOH withdrawal  For questions or updates, please contact [facility] Please consult [email or web address] for contact info under    Signed, , MD  01/17/2020 9:10 AM

## 2020-01-17 NOTE — Progress Notes (Signed)
PROGRESS NOTE    TATEN MERROW  ZOX:[id number] DOB: 18-Aug-1990 DOA: 01/11/2020 PCP: [facility], [facility]    Brief Narrative:  HPI: Omar Mccarthy is a 29 y.o. male with medical history significant for hypertension.  Patient presented to the ED with complaints of bilateral lower extremity swelling for the past several months.  He also reports intermittent vomiting, difficulty breathing also intermittent. Patient was in the ED in March, after he left the ED, his swelling improved at that time, but then reoccurred again.  He reports abdominal bloating, he also reports weight gain, his baseline weight is about 240, but his weight was checked today at the health department, it was 273. He reports difficulty breathing, intermittent, stable and unchanged 2-3 pillow orthopnea.  He reports chest pain ongoing over several years, intermittent, not related to activity, described both as sharp, pressure-like and burning, and usually related to his alcohol withdrawal episodes.  No family history of heart disease in parents or siblings. Patient drinks 24-26 beers daily or drinks half a gallon of vodka daily.  His last drink was at about 4 PM, just prior to arrival in the ED, 6 hours ago. Patient reports about 2 weeks ago, he had several episodes of black stools/abnormal colored stools, this lasted for about 2 months.  No vomiting.  Reports occasionally seeing blood when he wipes after a bowel movement.  He has not received the Covid vaccine.  ED Course: Tachycardic up to 150s.  BNP elevated 279.  Blood alcohol level elevated at 364.  Ammonia 62.  Lipase 112.  Troponin X 24.  Hemoglobin 11.7 down from 14.65 months ago..  Left lower extremity venous Dopplers negative for DVT.  Portable chest x-ray without acute abnormality. 1 L bolus given in ED with improvement in patient's heart rate.  Hospitalist to admit for alcohol intoxication and volume overload.   Assessment & Plan:   Principal  Problem:   Fluid overload Active Problems:   Alcohol abuse with intoxication (HCC)   Leg swelling   Respiratory failure requiring intubation (HCC)   Lack of intravenous access   1. Atrial fibrillation with rapid ventricular response. Echocardiogram shows ejection fraction of 40%.  CHA2DS2-VASc score of 1 at this time.  Does not appear to be a candidate for anticoagulation with low CHA2DS2-VASc score as well as ongoing issues with alcoholism. Initially required cardizem infusion and IV metoprolol. Cardizem had been weaned off but on 8/21, patient developed rapid atrial fibrillation with hypotension.  Heart rate ranging in the 150s to 180s with blood pressure in the 90s.  Discussed with Dr. Gala Romney patient started on amiodarone infusion. This initially helped, but on 8/22, he went back into rapid a fib. Started back on cardizem infusion in addition to amiodarone. Metoprolol dose also increased.  Cardiology is following, appreciate input 2. Acute systolic CHF vs. PNA.  Patient initially presented with shortness of breath and concerns for volume overload. EF found to be 40% on echo. CTA chest negative for pulmonary embolism. Follow up chest xray shows bilateral pleural effusion with possible consolidation. CVP was 12-13. Oxygen sat on CO-OX was 74.  He received 1 dose of Lasix with good response.  Currently breathing comfortably on room air. Currently, on intravenous antibiotics. 3. Elevated liver enzymes.  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  Viral hepatitis panel negative, abdominal ultrasound unrevealing. Transaminases are trending down. 4. Thrombocytopenia.  Suspect this is related to alcohol use.  Improving. Continue to follow. 5. Cellulitis of left lower extremity.  Noted to be erythematous, warm to touch.  Currently on Ancef.  Overall erythema improving 6. Sepsis. Patient was transiently febrile, tachycardic and tachypneic with elevated lactic acid at 4.5. Sources of infection include  cellulitis vs. Possible pneumonia. Lactate has improved to 1.8 with IV fluids, blood cultures in process. Hemodynamics are stabilizing. 7. Alcohol withdrawal with delirium tremens.  Patient started on Precedex for worsening alcohol withdrawal.  Despite maxing out on Precedex, he required restraints and intermittent doses of Ativan.  Despite these measures, patient remained tachypneic, tachycardic.  Respiratory rate in the 40s to 50s, heart rate in the 120s to 130s.  By mental status, he was significantly lethargic.  It appeared that he needed more sedation and would likely benefit from propofol.  He was subsequently intubated for airway support. Patient self extubated on 8/22. Currently appears calm. Will continue ativan per CIWA protocol. 8. Mechanical ventilation for severe delirium tremens and respiratory distress. PCCM consulted to assist with vent management. Patient self extubated on 8/22. Respiratory status currently stable. 9. Cardiomyopathy. EF of 40% on echocardiogram.  Possibly tachycardia induced from atrial fibrillation versus alcohol.  Cardiology following.   DVT prophylaxis: SCDs Start: 01/11/20 2316  Code Status: Full code Family Communication: updated patients father over the phone Disposition Plan: Status is: Inpatient  Remains inpatient appropriate because:IV treatments appropriate due to intensity of illness or inability to take PO   Dispo: The patient is from: Home              Anticipated d/c is to: Home              Anticipated d/c date is: 2 days              Patient currently is not medically stable to d/c.   Consultants:   PCCM  Gen Surg for central line placement  Cardiology  Procedures:   Echo: EF 40% with global hypokinesis  ETT 8/19>8/22  CVC left subclavian 8/21>  Antimicrobials:   Ancef 8/18>   Subjective: Denies any shortness of breath or any pain.  No chest pain.  Objective: Vitals:   01/17/20 0930 01/17/20 1129 01/17/20 1605 01/17/20  1730  BP: 130/77   (!) 140/96  Pulse:      Resp: (!) 35 (!) 25 (!) 0 (!) 22  Temp:  98.9 F (37.2 C) 99.1 F (37.3 C)   TempSrc:  Axillary Axillary   SpO2:      Weight:      Height:        Intake/Output Summary (Last 24 hours) at 01/17/2020 1812 Last data filed at 01/17/2020 1755 Gross per 24 hour  Intake 1453.44 ml  Output 5400 ml  Net -3946.56 ml   Filed Weights   01/15/20 0345 01/16/20 0500 01/17/20 0500  Weight: 129.1 kg 130.9 kg 129 kg    Examination:  General exam: Alert, awake, oriented x 3 Respiratory system: Clear to auscultation. Respiratory effort normal. Cardiovascular system: Irregular. No murmurs, rubs, gallops. Gastrointestinal system: Abdomen is nondistended, soft and nontender. No organomegaly or masses felt. Normal bowel sounds heard. Central nervous system: Alert and oriented. No focal neurological deficits. Extremities: No C/C/E, +pedal pulses Skin: No rashes, lesions or ulcers Psychiatry: Judgement and insight appear normal. Mood & affect appropriate.      Data Reviewed: I have personally reviewed following labs and imaging studies  CBC: Recent Labs  Lab 01/11/20 1914 01/11/20 1956 01/13/20 3903 01/14/20 0505 01/15/20 0421 01/16/20 0524 01/17/20 0857  WBC 4.5   < >  6.0 8.5 8.1 7.2 5.5  NEUTROABS 3.1  --   --   --   --   --   --   HGB 11.7*   < > 12.2* 12.2* 11.4* 11.4* 11.3*  HCT 34.7*   < > 36.7* 37.2* 35.0* 35.1* 33.6*  MCV 105.2*   < > 106.1* 107.8* 108.0* 107.7* 105.7*  PLT 96*   < > 65* 62* 64* 89* 100*   < > = values in this interval not displayed.   Basic Metabolic Panel: Recent Labs  Lab 01/11/20 1914 01/11/20 1956 01/13/20 0822 01/14/20 0505 01/14/20 1701 01/15/20 0421 01/15/20 1710 01/16/20 0524 01/17/20 0857  NA 139   < > 136 136  --  136  --  134* 134*  K 4.3   < > 4.4 3.6  --  3.5  --  3.0* 3.2*  CL 106   < > 102 106  --  106  --  103 101  CO2 18*   < > 20* 15*  --  21*  --  23 24  GLUCOSE 120*   < > 160* 113*   --  84  --  115* 103*  BUN 5*   < > 8 11  --  13  --  10 5*  CREATININE 0.60*   < > 0.90 0.84  --  0.55*  --  0.48* 0.43*  CALCIUM 8.3*   < > 8.6* 7.9*  --  7.7*  --  7.8* 8.0*  MG 2.0  --   --   --  1.6* 1.7 1.7 1.8  --   PHOS 4.4  --   --   --  3.2 3.2 3.4  --   --    < > = values in this interval not displayed.   GFR: Estimated Creatinine Clearance: 175.9 mL/min (A) (by C-G formula based on SCr of 0.43 mg/dL (L)). Liver Function Tests: Recent Labs  Lab 01/13/20 0822 01/14/20 0505 01/15/20 0421 01/16/20 0524 01/17/20 0857  AST 114* 164* 152* 152* 111*  ALT 78* 69* 62* 71* 63*  ALKPHOS 151* 114 112 133* 127*  BILITOT 2.8* 2.7* 3.0* 2.9* 3.0*  PROT 6.9 5.9* 5.5* 5.5* 5.8*  ALBUMIN 3.4* 2.9* 2.4* 2.4* 2.6*   Recent Labs  Lab 01/11/20 1914 01/12/20 0339 01/13/20 1217  LIPASE 112* 117* 40   Recent Labs  Lab 01/11/20 1914  AMMONIA 62*   Coagulation Profile: Recent Labs  Lab 01/11/20 1914 01/13/20 0822  INR 1.2 1.3*   Cardiac Enzymes: No results for input(s): CKTOTAL, CKMB, CKMBINDEX, TROPONINI in the last 168 hours. BNP (last 3 results) No results for input(s): PROBNP in the last 8760 hours. HbA1C: No results for input(s): HGBA1C in the last 72 hours. CBG: Recent Labs  Lab 01/16/20 2309 01/17/20 0330 01/17/20 0720 01/17/20 1131 01/17/20 1602  GLUCAP 127* 104* 111* 100* 105*   Lipid Profile: Recent Labs    01/15/20 0421 01/16/20 0523  TRIG 109 149   Thyroid Function Tests: No results for input(s): TSH, T4TOTAL, FREET4, T3FREE, THYROIDAB in the last 72 hours. Anemia Panel: No results for input(s): VITAMINB12, FOLATE, FERRITIN, TIBC, IRON, RETICCTPCT in the last 72 hours. Sepsis Labs: Recent Labs  Lab 01/11/20 2112 01/12/20 0339 01/13/20 1932 01/14/20 0505  LATICACIDVEN 3.2* 2.0* 4.5* 1.8    Recent Results (from the past 240 hour(s))  SARS Coronavirus 2 by RT PCR (hospital order, performed in [facility] hospital lab) Nasopharyngeal  Nasopharyngeal Swab  Status: None   Collection Time: 01/11/20  7:01 PM   Specimen: Nasopharyngeal Swab  Result Value Ref Range Status   SARS Coronavirus 2 NEGATIVE NEGATIVE Final    Comment: (NOTE) SARS-CoV-2 target nucleic acids are NOT DETECTED.  The SARS-CoV-2 RNA is generally detectable in upper and lower respiratory specimens during the acute phase of infection. The lowest concentration of SARS-CoV-2 viral copies this assay can detect is 250 copies / mL. A negative result does not preclude SARS-CoV-2 infection and should not be used as the sole basis for treatment or other patient management decisions.  A negative result may occur with improper specimen collection / handling, submission of specimen other than nasopharyngeal swab, presence of viral mutation(s) within the areas targeted by this assay, and inadequate number of viral copies (<250 copies / mL). A negative result must be combined with clinical observations, patient history, and epidemiological information.  Fact Sheet for Patients:   BoilerBrush.com.cy  Fact Sheet for Healthcare Providers: https://pope.com/  This test is not yet approved or  cleared by the [place] FDA and has been authorized for detection and/or diagnosis of SARS-CoV-2 by FDA under an Emergency Use Authorization (EUA).  This EUA will remain in effect (meaning this test can be used) for the duration of the COVID-19 declaration under Section 564(b)(1) of the Act, 21 U.S.C. section 360bbb-3(b)(1), unless the authorization is terminated or revoked sooner.  Performed at [facility], [address] [place], [place] [address]   Culture, blood (routine x 2)     Status: None   Collection Time: 01/11/20  7:14 PM   Specimen: BLOOD RIGHT ARM  Result Value Ref Range Status   Specimen Description BLOOD RIGHT ARM  Final   Special Requests   Final    BOTTLES DRAWN AEROBIC AND ANAEROBIC Blood Culture  adequate volume   Culture   Final    NO GROWTH 5 DAYS Performed at [facility], [address], [place], [place] [address]    Report Status 01/16/2020 FINAL  Final  Culture, blood (routine x 2)     Status: Abnormal   Collection Time: 01/11/20  7:24 PM   Specimen: BLOOD RIGHT FOREARM  Result Value Ref Range Status   Specimen Description   Final    BLOOD RIGHT FOREARM Performed at [facility], [address], [place], [place] [address]    Special Requests   Final    BOTTLES DRAWN AEROBIC AND ANAEROBIC Blood Culture adequate volume Performed at [facility], [address], [place], [place] [address]    Culture  Setup Time   Final    GRAM POSITIVE COCCI aerobic bottle Gram Stain Report Called to,Read Back By and Verified With: HILTON, L@ 1716 by MATTHEWS, B 8.18.21 [facility] Organism ID to follow CRITICAL RESULT CALLED TO, READ BACK BY AND VERIFIED WITH: H TETREAULT RN 01/12/20 2254 JDW    Culture (A)  Final    STAPHYLOCOCCUS HOMINIS THE SIGNIFICANCE OF ISOLATING THIS ORGANISM FROM A SINGLE SET OF BLOOD CULTURES WHEN MULTIPLE SETS ARE DRAWN IS UNCERTAIN. PLEASE NOTIFY THE MICROBIOLOGY DEPARTMENT WITHIN ONE WEEK IF SPECIATION AND SENSITIVITIES ARE REQUIRED. Performed at [facility], [address], [place], [place] [address]    Report Status 01/13/2020 FINAL  Final  Blood Culture ID Panel (Reflexed)     Status: Abnormal   Collection Time: 01/11/20  7:24 PM  Result Value Ref Range Status   Enterococcus faecalis NOT DETECTED NOT DETECTED Final   Enterococcus Faecium NOT  DETECTED NOT DETECTED Final   Listeria monocytogenes NOT DETECTED NOT DETECTED Final   Staphylococcus species DETECTED (A) NOT DETECTED Final    Comment: CRITICAL RESULT CALLED TO, READ BACK BY AND VERIFIED WITH: H TETREAULT RN 01/12/20 2254 JDW    Staphylococcus aureus (BCID) NOT DETECTED NOT DETECTED Final   Staphylococcus epidermidis NOT DETECTED NOT DETECTED Final   Staphylococcus lugdunensis NOT  DETECTED NOT DETECTED Final   Streptococcus species NOT DETECTED NOT DETECTED Final   Streptococcus agalactiae NOT DETECTED NOT DETECTED Final   Streptococcus pneumoniae NOT DETECTED NOT DETECTED Final   Streptococcus pyogenes NOT DETECTED NOT DETECTED Final   A.calcoaceticus-baumannii NOT DETECTED NOT DETECTED Final   Bacteroides fragilis NOT DETECTED NOT DETECTED Final   Enterobacterales NOT DETECTED NOT DETECTED Final   Enterobacter cloacae complex NOT DETECTED NOT DETECTED Final   Escherichia coli NOT DETECTED NOT DETECTED Final   Klebsiella aerogenes NOT DETECTED NOT DETECTED Final   Klebsiella oxytoca NOT DETECTED NOT DETECTED Final   Klebsiella pneumoniae NOT DETECTED NOT DETECTED Final   Proteus species NOT DETECTED NOT DETECTED Final   Salmonella species NOT DETECTED NOT DETECTED Final   Serratia marcescens NOT DETECTED NOT DETECTED Final   Haemophilus influenzae NOT DETECTED NOT DETECTED Final   Neisseria meningitidis NOT DETECTED NOT DETECTED Final   Pseudomonas aeruginosa NOT DETECTED NOT DETECTED Final   Stenotrophomonas maltophilia NOT DETECTED NOT DETECTED Final   Candida albicans NOT DETECTED NOT DETECTED Final   Candida auris NOT DETECTED NOT DETECTED Final   Candida glabrata NOT DETECTED NOT DETECTED Final   Candida krusei NOT DETECTED NOT DETECTED Final   Candida parapsilosis NOT DETECTED NOT DETECTED Final   Candida tropicalis NOT DETECTED NOT DETECTED Final   Cryptococcus neoformans/gattii NOT DETECTED NOT DETECTED Final    Comment: Performed at [facility], [address] [place], [place] [address]  MRSA PCR Screening     Status: None   Collection Time: 01/12/20  6:45 AM   Specimen: Nasal Mucosa; Nasopharyngeal  Result Value Ref Range Status   MRSA by PCR NEGATIVE NEGATIVE Final    Comment:        The GeneXpert MRSA Assay (FDA approved for NASAL specimens only), is one component of a comprehensive MRSA colonization surveillance program. It is  not intended to diagnose MRSA infection nor to guide or monitor treatment for MRSA infections. Performed at [facility], [address], [place], [place] [address]   Culture, blood (routine x 2)     Status: None (Preliminary result)   Collection Time: 01/13/20 12:16 PM   Specimen: BLOOD  Result Value Ref Range Status   Specimen Description BLOOD LEFT ANTECUBITAL  Final   Special Requests   Final    BOTTLES DRAWN AEROBIC AND ANAEROBIC Blood Culture adequate volume   Culture   Final    NO GROWTH 3 DAYS Performed at [facility], [address], [place], [place] [address]    Report Status PENDING  Incomplete  Culture, blood (routine x 2)     Status: None (Preliminary result)   Collection Time: 01/13/20 12:16 PM   Specimen: BLOOD  Result Value Ref Range Status   Specimen Description BLOOD LEFT HAND  Final   Special Requests   Final    BOTTLES DRAWN AEROBIC AND ANAEROBIC Blood Culture adequate volume   Culture   Final    NO GROWTH 3 DAYS Performed at [facility], [address], [place], [place] [address]    Report Status  PENDING  Incomplete  Culture, blood (routine x 2)     Status: None (Preliminary result)   Collection Time: 01/14/20  4:56 AM   Specimen: BLOOD LEFT HAND  Result Value Ref Range Status   Specimen Description BLOOD LEFT HAND  Final   Special Requests   Final    BOTTLES DRAWN AEROBIC AND ANAEROBIC Blood Culture adequate volume   Culture   Final    NO GROWTH 2 DAYS Performed at [facility], [address] [place], [place] [address]    Report Status PENDING  Incomplete  Culture, blood (routine x 2)     Status: None (Preliminary result)   Collection Time: 01/14/20  5:05 AM   Specimen: BLOOD LEFT HAND  Result Value Ref Range Status   Specimen Description BLOOD LEFT HAND  Final   Special Requests   Final    BOTTLES DRAWN AEROBIC AND ANAEROBIC Blood Culture adequate volume   Culture   Final    NO GROWTH 2 DAYS Performed at [facility], [address]  [place], [place] [address]    Report Status PENDING  Incomplete         Radiology Studies: DG CHEST PORT 1 VIEW  Result Date: 01/16/2020 CLINICAL DATA:  29 year old male with history of acute respiratory failure. EXAM: PORTABLE CHEST 1 VIEW COMPARISON:  Chest x-ray 01/15/2020. FINDINGS: An endotracheal tube is in place with tip 4.4 cm above the carina. There is a left-sided subclavian central venous catheter with tip terminating in the proximal superior vena cava. A nasogastric tube is seen extending into the stomach, however, the tip of the nasogastric tube extends below the lower margin of the image. Lung volumes are low. Bibasilar opacities may reflect areas of atelectasis and/or consolidation. Small bilateral pleural effusions (right greater than left). Heart size is mildly enlarged. Upper mediastinal contours are within normal limits. IMPRESSION: 1. Support apparatus, as above. 2. Persistent bibasilar areas of atelectasis and/or consolidation with superimposed small bilateral pleural effusions. Electronically Signed   By: Trudie Reed M.D.   On: 01/16/2020 06:09        Scheduled Meds: . chlorhexidine  15 mL Mouth Rinse BID  . Chlorhexidine Gluconate Cloth  6 each Topical Daily  . docusate sodium  100 mg Oral BID  . folic acid  1 mg Oral Daily  . mouth rinse  15 mL Mouth Rinse q12n4p  . metoprolol tartrate  50 mg Oral BID  . multivitamin with minerals  1 tablet Oral Daily  . nicotine  21 mg Transdermal Daily  . pantoprazole sodium  40 mg Oral Q24H  . polyethylene glycol  17 g Oral Daily  . sodium chloride flush  10-40 mL Intracatheter Q12H  . thiamine  100 mg Oral Daily   Or  . thiamine  100 mg Intravenous Daily   Continuous Infusions: . amiodarone 30 mg/hr (01/17/20 0542)  .  ceFAZolin (ANCEF) IV 2 g (01/17/20 1320)  . diltiazem (CARDIZEM) infusion 15 mg/hr (01/17/20 1009)     LOS: 6 days    Critical care procedure note Authorized and performed by: Erick Blinks Total critical care time: Approximately 35 minutes Due to high probability of clinically significant, life-threatening deterioration, the patient required my highest level of preparedness to intervene emergently and I personally spent this critical care time directly and personally managing the patient.  The critical care time included obtaining a history, examining the patient, pulse oximetry, ordering and review of studies, arranging urgent treatment with development of a  management plan, evaluation of patient's response to treatment, frequent reassessment, discussions with other providers.  Critical care time was performed to assess and manage the high probability of imminent, life-threatening deterioration that could result in multiorgan failure.  It was exclusive of separate billable procedures and treating other patients and teaching time.  Please see MDM section and the rest of the of note for further information on patient assessment and treatment     Erick Blinks, MD [facility]   If 7PM-7AM, please contact night-coverage [email or web address]  01/17/2020, 6:12 PM

## 2020-01-18 DIAGNOSIS — F1020 Alcohol dependence, uncomplicated: Secondary | ICD-10-CM

## 2020-01-18 LAB — BASIC METABOLIC PANEL
Anion gap: 11 (ref 5–15)
BUN: <5 mg/dL — ABNORMAL LOW (ref 6–20)
CO2: 22 mmol/L (ref 22–32)
Calcium: 8.2 mg/dL — ABNORMAL LOW (ref 8.9–10.3)
Chloride: 101 mmol/L (ref 98–111)
Creatinine, Ser: 0.37 mg/dL — ABNORMAL LOW (ref 0.61–1.24)
GFR calc Af Amer: >60 mL/min (ref >60)
GFR calc non Af Amer: >60 mL/min (ref >60)
Glucose, Bld: 105 mg/dL — ABNORMAL HIGH (ref 70–99)
Potassium: 3.7 mmol/L (ref 3.5–5.1)
Sodium: 134 mmol/L — ABNORMAL LOW (ref 135–145)

## 2020-01-18 LAB — CBC
HCT: 34.7 % — ABNORMAL LOW (ref 39.0–52.0)
Hemoglobin: 11.2 g/dL — ABNORMAL LOW (ref 13.0–17.0)
MCH: 34.9 pg — ABNORMAL HIGH (ref 26.0–34.0)
MCHC: 32.3 g/dL (ref 30.0–36.0)
MCV: 108.1 fL — ABNORMAL HIGH (ref 80.0–100.0)
Platelets: 135 10*3/uL — ABNORMAL LOW (ref 150–400)
RBC: 3.21 MIL/uL — ABNORMAL LOW (ref 4.22–5.81)
RDW: 15.7 % — ABNORMAL HIGH (ref 11.5–15.5)
WBC: 5.3 10*3/uL (ref 4.0–10.5)
nRBC: 0 % (ref 0.0–0.2)

## 2020-01-18 MED ORDER — APIXABAN 5 MG PO TABS
5.0000 mg | ORAL_TABLET | Freq: Two times a day (BID) | ORAL | Status: DC
  Administered 2020-01-18 – 2020-01-22 (×9): 5 mg via ORAL
  Filled 2020-01-18 (×9): qty 1

## 2020-01-18 MED ORDER — METOPROLOL TARTRATE 50 MG PO TABS
100.0000 mg | ORAL_TABLET | Freq: Two times a day (BID) | ORAL | Status: DC
  Administered 2020-01-18 – 2020-01-21 (×8): 100 mg via ORAL
  Filled 2020-01-18 (×8): qty 2

## 2020-01-18 MED ORDER — AMIODARONE LOAD VIA INFUSION
150.0000 mg | Freq: Once | INTRAVENOUS | Status: AC
  Administered 2020-01-18: 150 mg via INTRAVENOUS
  Filled 2020-01-18: qty 83.34

## 2020-01-18 NOTE — Progress Notes (Signed)
Progress Note  Patient Name: Omar Mccarthy Date of Encounter: 01/18/2020  [facility] Cardiologist: New  Subjective   No complaints  Inpatient Medications    Scheduled Meds: . chlorhexidine  15 mL Mouth Rinse BID  . Chlorhexidine Gluconate Cloth  6 each Topical Daily  . docusate sodium  100 mg Oral BID  . folic acid  1 mg Oral Daily  . mouth rinse  15 mL Mouth Rinse q12n4p  . metoprolol tartrate  50 mg Oral BID  . multivitamin with minerals  1 tablet Oral Daily  . nicotine  21 mg Transdermal Daily  . pantoprazole sodium  40 mg Oral Q24H  . polyethylene glycol  17 g Oral Daily  . sodium chloride flush  10-40 mL Intracatheter Q12H  . thiamine  100 mg Oral Daily   Or  . thiamine  100 mg Intravenous Daily   Continuous Infusions: . amiodarone 30 mg/hr (01/18/20 0625)  .  ceFAZolin (ANCEF) IV 2 g (01/18/20 0518)  . diltiazem (CARDIZEM) infusion 15 mg/hr (01/18/20 0245)   PRN Meds: acetaminophen, acetaminophen, glycopyrrolate, iohexol, LORazepam, ondansetron **OR** ondansetron (ZOFRAN) IV, phenol, sodium chloride flush, traMADol   Vital Signs    Vitals:   01/18/20 0400 01/18/20 0500 01/18/20 0600 01/18/20 0730  BP: 94/72 (!) 97/56 118/84   Pulse: 90 77 86 (!) 104  Resp: (!) 34 (!) 28 (!) 39 20  Temp: 98 F (36.7 C)   98.2 F (36.8 C)  TempSrc:    Oral  SpO2: 94% 95% 97% 98%  Weight:  129 kg    Height:        Intake/Output Summary (Last 24 hours) at 01/18/2020 0756 Last data filed at 01/18/2020 0400 Gross per 24 hour  Intake --  Output 2450 ml  Net -2450 ml   Last 3 Weights 01/18/2020 01/17/2020 01/16/2020  Weight (lbs) 284 lb 6.3 oz 284 lb 6.3 oz 288 lb 9.3 oz  Weight (kg) 129 kg 129 kg 130.9 kg      Telemetry    afib 90s-110s - Personally Reviewed  ECG    n/a - Personally Reviewed  Physical Exam   GEN: No acute distress.   Neck: No JVD Cardiac: irreg, tachy Respiratory: Clear to auscultation bilaterally. GI: Soft, nontender, non-distended   MS: No edema; No deformity. Neuro:  Nonfocal  Psych: Normal affect   Labs    High Sensitivity Troponin:   Recent Labs  Lab 01/11/20 1914 01/11/20 2112  TROPONINIHS 24* 23*      Chemistry Recent Labs  Lab 01/15/20 0421 01/15/20 0421 01/16/20 0524 01/17/20 0857 01/18/20 0555  NA 136   < > 134* 134* 134*  K 3.5   < > 3.0* 3.2* 3.7  CL 106   < > 103 101 101  CO2 21*   < > 23 24 22   GLUCOSE 84   < > 115* 103* 105*  BUN 13   < > 10 5* <5*  CREATININE 0.55*   < > 0.48* 0.43* 0.37*  CALCIUM 7.7*   < > 7.8* 8.0* 8.2*  PROT 5.5*  --  5.5* 5.8*  --   ALBUMIN 2.4*  --  2.4* 2.6*  --   AST 152*  --  152* 111*  --   ALT 62*  --  71* 63*  --   ALKPHOS 112  --  133* 127*  --   BILITOT 3.0*  --  2.9* 3.0*  --   GFRNONAA >60   < > >  60 >60 >60  GFRAA >60   < > >60 >60 >60  ANIONGAP 9   < > 8 9 11    < > = values in this interval not displayed.     Hematology Recent Labs  Lab 01/16/20 0524 01/17/20 0857 01/18/20 0555  WBC 7.2 5.5 5.3  RBC 3.26* 3.18* 3.21*  HGB 11.4* 11.3* 11.2*  HCT 35.1* 33.6* 34.7*  MCV 107.7* 105.7* 108.1*  MCH 35.0* 35.5* 34.9*  MCHC 32.5 33.6 32.3  RDW 16.3* 15.7* 15.7*  PLT 89* 100* 135*    BNP Recent Labs  Lab 01/11/20 1914  BNP 279.0*     DDimer No results for input(s): DDIMER in the last 168 hours.   Radiology    No results found.  Cardiac Studies    Patient Profile     Omar Mccarthy is a 29 y.o. male with a hx of HTN, EtOH abuse, fatty liver  who is being seen today for the evaluation of SOB at the request of Dr 37.  Assessment & Plan    1. Afib with RVR - new diagnosis this admission in the setting of severe EtoH withdrawal, sepsis - CHADS2Vasc score is 2, had not started anticoag due to chronic EtoH use and thrombocytopenia - difficultly controlling rates. Currently on dilt gtt at 15, amio 30mg /hr, oral metoprolol 50mg  bid. SOft bp's at times but not consistently - rates 90s to 110s. - would not be a candidate for  cardioversion without being able to firmly commit to 4 weeks of anticoag after. Platelets are improving, if not able to rate control will reconsider starting anticoag and cardioversion this admit, would require TEE/DCCV. WIll go ahead and start eliquis now in anticipation of possible TEE/DCCV on Thursday if not able to control with medications    - would plan only for short course of amio in general  - rebolus amio 150mg  today - increase oral lopressor to 100mg  bid - wean dilt gtt as tolerated  2. Acute systolic HF - - echo LVEF 40%, mod RV dysfunction - COOX 74%, CVP 5-8 - likely EtOH mediated vs tachy mediated CM. No plan for ischemic testing.  - no further diuresis at this time - hold on ACE/ARB/ARNI as need room with bp for av nodal agents at this time with his afib - consolidated lopressor to toprol prior to discharge  3. Sepsis - per primary team  4. EtOH withdrawal  - per primary team   5. Thrombocytopenia - improving.   For questions or updates, please contact [facility] Please consult [email or web address] for contact info under        Signed, , MD  01/18/2020, 7:56 AM

## 2020-01-18 NOTE — Progress Notes (Signed)
PROGRESS NOTE    Omar Mccarthy  EFE:[id number] DOB: January 11, 1991 DOA: 01/11/2020 PCP: Health, [facility]    Brief Narrative:  HPI: Omar Mccarthy is a 29 y.o. male with medical history significant for hypertension.  Patient presented to the ED with complaints of bilateral lower extremity swelling for the past several months.  He also reports intermittent vomiting, difficulty breathing also intermittent. Patient was in the ED in March, after he left the ED, his swelling improved at that time, but then reoccurred again.  He reports abdominal bloating, he also reports weight gain, his baseline weight is about 240, but his weight was checked today at the health department, it was 273. He reports difficulty breathing, intermittent, stable and unchanged 2-3 pillow orthopnea.  He reports chest pain ongoing over several years, intermittent, not related to activity, described both as sharp, pressure-like and burning, and usually related to his alcohol withdrawal episodes.  No family history of heart disease in parents or siblings. Patient drinks 24-26 beers daily or drinks half a gallon of vodka daily.  His last drink was at about 4 PM, just prior to arrival in the ED, 6 hours ago. Patient reports about 2 weeks ago, he had several episodes of black stools/abnormal colored stools, this lasted for about 2 months.  No vomiting.  Reports occasionally seeing blood when he wipes after a bowel movement.  He has not received the Covid vaccine.  ED Course: Tachycardic up to 150s.  BNP elevated 279.  Blood alcohol level elevated at 364.  Ammonia 62.  Lipase 112.  Troponin X 24.  Hemoglobin 11.7 down from 14.65 months ago..  Left lower extremity venous Dopplers negative for DVT.  Portable chest x-ray without acute abnormality. 1 L bolus given in ED with improvement in patient's heart rate.  Hospitalist to admit for alcohol intoxication and volume overload.   Assessment & Plan:   Principal  Problem:   Fluid overload Active Problems:   Alcohol abuse with intoxication (HCC)   Leg swelling   Respiratory failure requiring intubation (HCC)   Lack of intravenous access   1. Atrial fibrillation with rapid ventricular response. Echocardiogram shows ejection fraction of 40%.  CHA2DS2-VASc score of 1 at this time.  Does not appear to be a candidate for anticoagulation with low CHA2DS2-VASc score as well as ongoing issues with alcoholism. Initially required cardizem infusion and IV metoprolol. Cardizem had been weaned off but on 8/21, patient developed rapid atrial fibrillation with hypotension.  Heart rate ranging in the 150s to 180s with blood pressure in the 90s.  Discussed with Dr. Gala Romney patient started on amiodarone infusion. This initially helped, but on 8/22, he went back into rapid a fib. Started back on cardizem infusion in addition to amiodarone. Metoprolol dose also increased.  Cardiology is following, appreciate input. Since platelets have improved, he has been started on eliquis. If HR continues to be difficult to manage, may need to consider cardioversion. 2. Acute systolic CHF vs. PNA.  Patient initially presented with shortness of breath and concerns for volume overload. EF found to be 40% on echo. CTA chest negative for pulmonary embolism. Follow up chest xray shows bilateral pleural effusion with possible consolidation. CVP was 12-13. Oxygen sat on CO-OX was 74.  He received 1 dose of Lasix with good response.  Currently breathing comfortably on room air. Currently, on intravenous antibiotics. 3. Elevated liver enzymes.  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  Viral hepatitis panel negative, abdominal ultrasound unrevealing. Transaminases are trending  down. 4. Thrombocytopenia.  Suspect this is related to alcohol use.  Improving. Continue to follow. 5. Cellulitis of left lower extremity.  Noted to be erythematous, warm to touch.  Currently on Ancef.  Overall erythema  improving 6. Sepsis. Patient was transiently febrile, tachycardic and tachypneic with elevated lactic acid at 4.5. Sources of infection include cellulitis vs. Possible pneumonia. Lactate has improved to 1.8 with IV fluids. He had 1 out of 2 blood cultures positive for staph hominis. Discussed with Dr. Drue Second with recommendations to continue on ancef while inpatient and on discharge he can be transitioned to keflex to complete a total of 2 weeks of antibiotics. 7. Alcohol withdrawal with delirium tremens.  Patient started on Precedex for worsening alcohol withdrawal.  Despite maxing out on Precedex, he required restraints and intermittent doses of Ativan.  Despite these measures, patient remained tachypneic, tachycardic.  Respiratory rate in the 40s to 50s, heart rate in the 120s to 130s.  By mental status, he was significantly lethargic.  It appeared that he needed more sedation and would likely benefit from propofol.  He was subsequently intubated for airway support. Patient self extubated on 8/22. Currently appears calm. Will continue ativan per CIWA protocol. 8. Mechanical ventilation for severe delirium tremens and respiratory distress. PCCM consulted to assist with vent management. Patient self extubated on 8/22. Respiratory status currently stable. 9. Cardiomyopathy. EF of 40% on echocardiogram.  Possibly tachycardia induced from atrial fibrillation versus alcohol.  Cardiology following. He is currently on beta blockers.   DVT prophylaxis: SCDs Start: 01/11/20 2316  Code Status: Full code Family Communication: discussed with patient Disposition Plan: Status is: Inpatient  Remains inpatient appropriate because:IV treatments appropriate due to intensity of illness or inability to take PO   Dispo: The patient is from: Home              Anticipated d/c is to: Home              Anticipated d/c date is: 2 days              Patient currently is not medically stable to d/c.   Consultants:    PCCM  Gen Surg for central line placement  Cardiology  Procedures:   Echo: EF 40% with global hypokinesis  ETT 8/19>8/22  CVC left subclavian 8/21>  Antimicrobials:   Ancef 8/18>   Subjective: No shortness of breath, cough, chest pain, no palpitations  Objective: Vitals:   01/18/20 1400 01/18/20 1500 01/18/20 1600 01/18/20 1620  BP: (!) 120/52 (!) 107/58 (!) 109/59   Pulse:  82 79 82  Resp: (!) 28 18 (!) 22 18  Temp:    98.4 F (36.9 C)  TempSrc:    Oral  SpO2:  98% 98% 100%  Weight:      Height:        Intake/Output Summary (Last 24 hours) at 01/18/2020 1624 Last data filed at 01/18/2020 1500 Gross per 24 hour  Intake 2076.39 ml  Output 2450 ml  Net -373.61 ml   Filed Weights   01/16/20 0500 01/17/20 0500 01/18/20 0500  Weight: 130.9 kg 129 kg 129 kg    Examination:  General exam: Alert, awake, oriented x 3 Respiratory system: Clear to auscultation. Respiratory effort normal. Cardiovascular system:irregular. No murmurs, rubs, gallops. Gastrointestinal system: Abdomen is nondistended, soft and nontender. No organomegaly or masses felt. Normal bowel sounds heard. Central nervous system: Alert and oriented. No focal neurological deficits. Extremities: No C/C/E, +pedal pulses Skin: No rashes,  lesions or ulcers Psychiatry: Judgement and insight appear normal. Mood & affect appropriate.    Data Reviewed: I have personally reviewed following labs and imaging studies  CBC: Recent Labs  Lab 01/11/20 1914 01/11/20 1956 01/14/20 0505 01/15/20 0421 01/16/20 0524 01/17/20 0857 01/18/20 0555  WBC 4.5   < > 8.5 8.1 7.2 5.5 5.3  NEUTROABS 3.1  --   --   --   --   --   --   HGB 11.7*   < > 12.2* 11.4* 11.4* 11.3* 11.2*  HCT 34.7*   < > 37.2* 35.0* 35.1* 33.6* 34.7*  MCV 105.2*   < > 107.8* 108.0* 107.7* 105.7* 108.1*  PLT 96*   < > 62* 64* 89* 100* 135*   < > = values in this interval not displayed.   Basic Metabolic Panel: Recent Labs  Lab  01/11/20 1914 01/11/20 1956 01/14/20 0505 01/14/20 1701 01/15/20 0421 01/15/20 1710 01/16/20 0524 01/17/20 0857 01/18/20 0555  NA 139   < > 136  --  136  --  134* 134* 134*  K 4.3   < > 3.6  --  3.5  --  3.0* 3.2* 3.7  CL 106   < > 106  --  106  --  103 101 101  CO2 18*   < > 15*  --  21*  --  23 24 22   GLUCOSE 120*   < > 113*  --  84  --  115* 103* 105*  BUN 5*   < > 11  --  13  --  10 5* <5*  CREATININE 0.60*   < > 0.84  --  0.55*  --  0.48* 0.43* 0.37*  CALCIUM 8.3*   < > 7.9*  --  7.7*  --  7.8* 8.0* 8.2*  MG 2.0  --   --  1.6* 1.7 1.7 1.8  --   --   PHOS 4.4  --   --  3.2 3.2 3.4  --   --   --    < > = values in this interval not displayed.   GFR: Estimated Creatinine Clearance: 175.9 mL/min (A) (by C-G formula based on SCr of 0.37 mg/dL (L)). Liver Function Tests: Recent Labs  Lab 01/13/20 0822 01/14/20 0505 01/15/20 0421 01/16/20 0524 01/17/20 0857  AST 114* 164* 152* 152* 111*  ALT 78* 69* 62* 71* 63*  ALKPHOS 151* 114 112 133* 127*  BILITOT 2.8* 2.7* 3.0* 2.9* 3.0*  PROT 6.9 5.9* 5.5* 5.5* 5.8*  ALBUMIN 3.4* 2.9* 2.4* 2.4* 2.6*   Recent Labs  Lab 01/11/20 1914 01/12/20 0339 01/13/20 1217  LIPASE 112* 117* 40   Recent Labs  Lab 01/11/20 1914  AMMONIA 62*   Coagulation Profile: Recent Labs  Lab 01/11/20 1914 01/13/20 0822  INR 1.2 1.3*   Cardiac Enzymes: No results for input(s): CKTOTAL, CKMB, CKMBINDEX, TROPONINI in the last 168 hours. BNP (last 3 results) No results for input(s): PROBNP in the last 8760 hours. HbA1C: No results for input(s): HGBA1C in the last 72 hours. CBG: Recent Labs  Lab 01/17/20 0330 01/17/20 0720 01/17/20 1131 01/17/20 1602 01/17/20 2010  GLUCAP 104* 111* 100* 105* 97   Lipid Profile: Recent Labs    01/16/20 0523  TRIG 149   Thyroid Function Tests: No results for input(s): TSH, T4TOTAL, FREET4, T3FREE, THYROIDAB in the last 72 hours. Anemia Panel: No results for input(s): VITAMINB12, FOLATE, FERRITIN,  TIBC, IRON, RETICCTPCT in the last 72 hours. Sepsis  Labs: Recent Labs  Lab 01/11/20 2112 01/12/20 0339 01/13/20 1932 01/14/20 0505  LATICACIDVEN 3.2* 2.0* 4.5* 1.8    Recent Results (from the past 240 hour(s))  SARS Coronavirus 2 by RT PCR (hospital order, performed in [facility] hospital lab) Nasopharyngeal Nasopharyngeal Swab     Status: None   Collection Time: 01/11/20  7:01 PM   Specimen: Nasopharyngeal Swab  Result Value Ref Range Status   SARS Coronavirus 2 NEGATIVE NEGATIVE Final    Comment: (NOTE) SARS-CoV-2 target nucleic acids are NOT DETECTED.  The SARS-CoV-2 RNA is generally detectable in upper and lower respiratory specimens during the acute phase of infection. The lowest concentration of SARS-CoV-2 viral copies this assay can detect is 250 copies / mL. A negative result does not preclude SARS-CoV-2 infection and should not be used as the sole basis for treatment or other patient management decisions.  A negative result may occur with improper specimen collection / handling, submission of specimen other than nasopharyngeal swab, presence of viral mutation(s) within the areas targeted by this assay, and inadequate number of viral copies (<250 copies / mL). A negative result must be combined with clinical observations, patient history, and epidemiological information.  Fact Sheet for Patients:   BoilerBrush.com.cy  Fact Sheet for Healthcare Providers: https://pope.com/  This test is not yet approved or  cleared by the [place] FDA and has been authorized for detection and/or diagnosis of SARS-CoV-2 by FDA under an Emergency Use Authorization (EUA).  This EUA will remain in effect (meaning this test can be used) for the duration of the COVID-19 declaration under Section 564(b)(1) of the Act, 21 U.S.C. section 360bbb-3(b)(1), unless the authorization is terminated or revoked sooner.  Performed at [facility], [address] [place], [place] [address]   Culture, blood (routine x 2)     Status: None   Collection Time: 01/11/20  7:14 PM   Specimen: BLOOD RIGHT ARM  Result Value Ref Range Status   Specimen Description BLOOD RIGHT ARM  Final   Special Requests   Final    BOTTLES DRAWN AEROBIC AND ANAEROBIC Blood Culture adequate volume   Culture   Final    NO GROWTH 5 DAYS Performed at [facility], [address] [place], [place] [address]    Report Status 01/16/2020 FINAL  Final  Culture, blood (routine x 2)     Status: Abnormal   Collection Time: 01/11/20  7:24 PM   Specimen: BLOOD RIGHT FOREARM  Result Value Ref Range Status   Specimen Description   Final    BLOOD RIGHT FOREARM Performed at [facility], [address], [place], [place] [address]    Special Requests   Final    BOTTLES DRAWN AEROBIC AND ANAEROBIC Blood Culture adequate volume Performed at [facility], [address], [place], [place] [address]    Culture  Setup Time   Final    GRAM POSITIVE COCCI aerobic bottle Gram Stain Report Called to,Read Back By and Verified With: HILTON, L@ 1716 by MATTHEWS, B 8.18.21 [place] Organism ID to follow CRITICAL RESULT CALLED TO, READ BACK BY AND VERIFIED WITH: H TETREAULT RN 01/12/20 2254 JDW    Culture (A)  Final    STAPHYLOCOCCUS HOMINIS THE SIGNIFICANCE OF ISOLATING THIS ORGANISM FROM A SINGLE SET OF BLOOD CULTURES WHEN MULTIPLE SETS ARE DRAWN IS UNCERTAIN. PLEASE NOTIFY THE MICROBIOLOGY DEPARTMENT WITHIN ONE WEEK IF SPECIATION AND SENSITIVITIES ARE REQUIRED. Performed at [facility], [address], [place],  [place] [address]    Report Status 01/13/2020 FINAL  Final  Blood Culture ID Panel (Reflexed)     Status: Abnormal   Collection Time: 01/11/20  7:24 PM  Result Value Ref Range Status   Enterococcus faecalis NOT DETECTED NOT DETECTED Final   Enterococcus Faecium NOT DETECTED NOT DETECTED Final   Listeria monocytogenes NOT DETECTED NOT DETECTED Final    Staphylococcus species DETECTED (A) NOT DETECTED Final    Comment: CRITICAL RESULT CALLED TO, READ BACK BY AND VERIFIED WITH: H TETREAULT RN 01/12/20 2254 JDW    Staphylococcus aureus (BCID) NOT DETECTED NOT DETECTED Final   Staphylococcus epidermidis NOT DETECTED NOT DETECTED Final   Staphylococcus lugdunensis NOT DETECTED NOT DETECTED Final   Streptococcus species NOT DETECTED NOT DETECTED Final   Streptococcus agalactiae NOT DETECTED NOT DETECTED Final   Streptococcus pneumoniae NOT DETECTED NOT DETECTED Final   Streptococcus pyogenes NOT DETECTED NOT DETECTED Final   A.calcoaceticus-baumannii NOT DETECTED NOT DETECTED Final   Bacteroides fragilis NOT DETECTED NOT DETECTED Final   Enterobacterales NOT DETECTED NOT DETECTED Final   Enterobacter cloacae complex NOT DETECTED NOT DETECTED Final   Escherichia coli NOT DETECTED NOT DETECTED Final   Klebsiella aerogenes NOT DETECTED NOT DETECTED Final   Klebsiella oxytoca NOT DETECTED NOT DETECTED Final   Klebsiella pneumoniae NOT DETECTED NOT DETECTED Final   Proteus species NOT DETECTED NOT DETECTED Final   Salmonella species NOT DETECTED NOT DETECTED Final   Serratia marcescens NOT DETECTED NOT DETECTED Final   Haemophilus influenzae NOT DETECTED NOT DETECTED Final   Neisseria meningitidis NOT DETECTED NOT DETECTED Final   Pseudomonas aeruginosa NOT DETECTED NOT DETECTED Final   Stenotrophomonas maltophilia NOT DETECTED NOT DETECTED Final   Candida albicans NOT DETECTED NOT DETECTED Final   Candida auris NOT DETECTED NOT DETECTED Final   Candida glabrata NOT DETECTED NOT DETECTED Final   Candida krusei NOT DETECTED NOT DETECTED Final   Candida parapsilosis NOT DETECTED NOT DETECTED Final   Candida tropicalis NOT DETECTED NOT DETECTED Final   Cryptococcus neoformans/gattii NOT DETECTED NOT DETECTED Final    Comment: Performed at [facility], [address], [place], [place] [address]  MRSA PCR Screening     Status: None    Collection Time: 01/12/20  6:45 AM   Specimen: Nasal Mucosa; Nasopharyngeal  Result Value Ref Range Status   MRSA by PCR NEGATIVE NEGATIVE Final    Comment:        The GeneXpert MRSA Assay (FDA approved for NASAL specimens only), is one component of a comprehensive MRSA colonization surveillance program. It is not intended to diagnose MRSA infection nor to guide or monitor treatment for MRSA infections. Performed at [facility], [address] [place], [place] [address]   Culture, blood (routine x 2)     Status: None (Preliminary result)   Collection Time: 01/13/20 12:16 PM   Specimen: BLOOD  Result Value Ref Range Status   Specimen Description BLOOD LEFT ANTECUBITAL  Final   Special Requests   Final    BOTTLES DRAWN AEROBIC AND ANAEROBIC Blood Culture adequate volume   Culture   Final    NO GROWTH 3 DAYS Performed at [facility], [address] [place], [place] [address]    Report Status PENDING  Incomplete  Culture, blood (routine x 2)     Status: None (Preliminary result)   Collection Time: 01/13/20 12:16 PM   Specimen: BLOOD  Result Value Ref Range Status   Specimen Description BLOOD LEFT  HAND  Final   Special Requests   Final    BOTTLES DRAWN AEROBIC AND ANAEROBIC Blood Culture adequate volume   Culture   Final    NO GROWTH 3 DAYS Performed at [facility], [address], [place], [place] [address]    Report Status PENDING  Incomplete  Culture, blood (routine x 2)     Status: None (Preliminary result)   Collection Time: 01/14/20  4:56 AM   Specimen: BLOOD LEFT HAND  Result Value Ref Range Status   Specimen Description BLOOD LEFT HAND  Final   Special Requests   Final    BOTTLES DRAWN AEROBIC AND ANAEROBIC Blood Culture adequate volume   Culture   Final    NO GROWTH 2 DAYS Performed at [facility], [address], [place], [place] [address]    Report Status PENDING  Incomplete  Culture, blood (routine x 2)     Status: None (Preliminary result)   Collection  Time: 01/14/20  5:05 AM   Specimen: BLOOD LEFT HAND  Result Value Ref Range Status   Specimen Description BLOOD LEFT HAND  Final   Special Requests   Final    BOTTLES DRAWN AEROBIC AND ANAEROBIC Blood Culture adequate volume   Culture   Final    NO GROWTH 2 DAYS Performed at [facility], [address], [place], [place] [address]    Report Status PENDING  Incomplete         Radiology Studies: No results found.      Scheduled Meds: . apixaban  5 mg Oral BID  . chlorhexidine  15 mL Mouth Rinse BID  . Chlorhexidine Gluconate Cloth  6 each Topical Daily  . docusate sodium  100 mg Oral BID  . folic acid  1 mg Oral Daily  . mouth rinse  15 mL Mouth Rinse q12n4p  . metoprolol tartrate  100 mg Oral BID  . multivitamin with minerals  1 tablet Oral Daily  . nicotine  21 mg Transdermal Daily  . pantoprazole sodium  40 mg Oral Q24H  . polyethylene glycol  17 g Oral Daily  . sodium chloride flush  10-40 mL Intracatheter Q12H  . thiamine  100 mg Oral Daily   Or  . thiamine  100 mg Intravenous Daily   Continuous Infusions: . amiodarone 30 mg/hr (01/18/20 1422)  .  ceFAZolin (ANCEF) IV 2 g (01/18/20 1427)  . diltiazem (CARDIZEM) infusion 15 mg/hr (01/18/20 1220)     LOS: 7 days    Critical care procedure note Authorized and performed by: Erick Blinks Total critical care time: Approximately 35 minutes Due to high probability of clinically significant, life-threatening deterioration, the patient required my highest level of preparedness to intervene emergently and I personally spent this critical care time directly and personally managing the patient.  The critical care time included obtaining a history, examining the patient, pulse oximetry, ordering and review of studies, arranging urgent treatment with development of a management plan, evaluation of patient's response to treatment, frequent reassessment, discussions with other providers.  Critical care time was performed to  assess and manage the high probability of imminent, life-threatening deterioration that could result in multiorgan failure.  It was exclusive of separate billable procedures and treating other patients and teaching time.  Please see MDM section and the rest of the of note for further information on patient assessment and treatment     Erick Blinks, MD [facility]   If 7PM-7AM, please contact night-coverage [email or web address]  01/18/2020, 4:24  PM  

## 2020-01-18 NOTE — Progress Notes (Signed)
Nutrition Follow-up  DOCUMENTATION CODES:      INTERVENTION:  Ensure Enlive po BID, each supplement provides 350 kcal and 20 grams of protein  Continue MVI daily   NUTRITION DIAGNOSIS:   Inadequate oral intake related to acute illness as evidenced by NPO status. -Addressed via tube feedings, extubated, diet advanced  GOAL:   Provide needs based on ASPEN/SCCM guidelines -Met  MONITOR:   Vent status, Labs, I & O's, Weight trends, TF tolerance  REASON FOR ASSESSMENT:   Consult, Ventilator Enteral/tube feeding initiation and management  ASSESSMENT:  RD working remotely.  History of Hypertension and daily ETOH intake (beer or vodka. Cellulitis to lower extremities, weight gain. Acute CHF vs PNA, Alcohol withdrawal with delirium tremens and respiratory distress requiring intubation. Patient is currently intubated on ventilator support 8/19 @1945 .  8/18 Admit 8/20 Intubated; TF iniated 8/22 Self extubated; diet advanced  Patient with new onset Afib, declined TEE and cardioversion today. Pt with increased HH and palpitations with minimum activity, cardiology adjusting medications to see if rate can be better controlled.   Patient eating 75% average x 3 documented meals, will order Ensure to aid with meeting needs.   Admit wt 123 kg            Current wt 123.7 kg  I/Os: +3979.8 ml since admit UOP: 2450 ml x 24 hrs  Medications reviewed and include: Colace, Miralax, Digoxin, Cardizem, Folic acid, MVI, B1, Protonix IVPB: Ancef Labs: Na 133 (L), Cr 0.57 (L) K/P - WNL  Diet Order:   Diet Order            Diet Heart Room service appropriate? Yes; Fluid consistency: Thin  Diet effective now                 EDUCATION NEEDS:   Not appropriate for education at this time  Skin:  Skin Assessment: Skin Integrity Issues: Skin Integrity Issues:: Other (Comment) Other: cellulitis;BLE  Last BM:  8/26  Height:   Ht Readings from Last 1 Encounters:  01/14/20 5' 7"   (1.702 m)    Weight:   Wt Readings from Last 1 Encounters:  01/21/20 123.7 kg    BMI:  Body mass index is 42.71 kg/m.  Estimated Nutritional Needs: new  Kcal:  5852-7782  Protein:  115-130  Fluid:  >/= 2.3 L   Lajuan Lines, RD, LDN Clinical Nutrition After Hours/Weekend Pager # [phone]

## 2020-01-18 NOTE — Progress Notes (Signed)
Upon entering the pt's room I found him to be covered in stool and blood. Pt was standing at the bathroom sink, stating " I had to go really bad and I didn't want to mess on myself". Pt states, "I didn't want any help" when asked why he did not call the staff for assistance. Pt is alert and oriented times four. Pt had pulled his central line out. Line was completely intact.  Pt cleaned up, gown and linen changed, and floor mopped. Occlusive compression dressing applied to central line site which was no longer bleeding. Area around site was cleaned with chlorhexidine and betadine.  Pt put back to bed. Call light within reach. Phone within reach. Bed alarm on.  Pt instructed once again on how to get in touch with staff. On call provider notified via amion

## 2020-01-19 DIAGNOSIS — I4819 Other persistent atrial fibrillation: Secondary | ICD-10-CM | POA: Diagnosis present

## 2020-01-19 LAB — CULTURE, BLOOD (ROUTINE X 2)
Culture: NO GROWTH
Culture: NO GROWTH
Culture: NO GROWTH
Culture: NO GROWTH
Special Requests: ADEQUATE
Special Requests: ADEQUATE
Special Requests: ADEQUATE
Special Requests: ADEQUATE

## 2020-01-19 LAB — COMPREHENSIVE METABOLIC PANEL
ALT: 43 U/L (ref 0–44)
AST: 57 U/L — ABNORMAL HIGH (ref 15–41)
Albumin: 3 g/dL — ABNORMAL LOW (ref 3.5–5.0)
Alkaline Phosphatase: 117 U/L (ref 38–126)
Anion gap: 9 (ref 5–15)
BUN: <5 mg/dL — ABNORMAL LOW (ref 6–20)
CO2: 24 mmol/L (ref 22–32)
Calcium: 8.5 mg/dL — ABNORMAL LOW (ref 8.9–10.3)
Chloride: 103 mmol/L (ref 98–111)
Creatinine, Ser: 0.53 mg/dL — ABNORMAL LOW (ref 0.61–1.24)
GFR calc Af Amer: >60 mL/min (ref >60)
GFR calc non Af Amer: >60 mL/min (ref >60)
Glucose, Bld: 98 mg/dL (ref 70–99)
Potassium: 3.2 mmol/L — ABNORMAL LOW (ref 3.5–5.1)
Sodium: 136 mmol/L (ref 135–145)
Total Bilirubin: 1.8 mg/dL — ABNORMAL HIGH (ref 0.3–1.2)
Total Protein: 6.3 g/dL — ABNORMAL LOW (ref 6.5–8.1)

## 2020-01-19 LAB — MAGNESIUM: Magnesium: 1.9 mg/dL (ref 1.7–2.4)

## 2020-01-19 LAB — CBC
HCT: 40 % (ref 39.0–52.0)
Hemoglobin: 12.8 g/dL — ABNORMAL LOW (ref 13.0–17.0)
MCH: 34.6 pg — ABNORMAL HIGH (ref 26.0–34.0)
MCHC: 32 g/dL (ref 30.0–36.0)
MCV: 108.1 fL — ABNORMAL HIGH (ref 80.0–100.0)
Platelets: 183 10*3/uL (ref 150–400)
RBC: 3.7 MIL/uL — ABNORMAL LOW (ref 4.22–5.81)
RDW: 15.8 % — ABNORMAL HIGH (ref 11.5–15.5)
WBC: 5.7 10*3/uL (ref 4.0–10.5)
nRBC: 0 % (ref 0.0–0.2)

## 2020-01-19 MED ORDER — DIGOXIN 0.25 MG/ML IJ SOLN
0.5000 mg | Freq: Once | INTRAMUSCULAR | Status: AC
  Administered 2020-01-19: 0.5 mg via INTRAVENOUS
  Filled 2020-01-19: qty 2

## 2020-01-19 MED ORDER — DIGOXIN 0.25 MG/ML IJ SOLN
0.2500 mg | Freq: Four times a day (QID) | INTRAMUSCULAR | Status: AC
  Administered 2020-01-19 (×2): 0.25 mg via INTRAVENOUS
  Filled 2020-01-19 (×2): qty 2

## 2020-01-19 MED ORDER — POTASSIUM CHLORIDE CRYS ER 20 MEQ PO TBCR
40.0000 meq | EXTENDED_RELEASE_TABLET | Freq: Four times a day (QID) | ORAL | Status: AC
  Administered 2020-01-19 (×2): 40 meq via ORAL
  Filled 2020-01-19 (×2): qty 2

## 2020-01-19 NOTE — Progress Notes (Signed)
Progress Note  Patient Name: Omar Mccarthy Date of Encounter: 01/19/2020  [facility] Cardiologist: New  Subjective   No complaints  Inpatient Medications    Scheduled Meds: . apixaban  5 mg Oral BID  . chlorhexidine  15 mL Mouth Rinse BID  . Chlorhexidine Gluconate Cloth  6 each Topical Daily  . docusate sodium  100 mg Oral BID  . folic acid  1 mg Oral Daily  . mouth rinse  15 mL Mouth Rinse q12n4p  . metoprolol tartrate  100 mg Oral BID  . multivitamin with minerals  1 tablet Oral Daily  . nicotine  21 mg Transdermal Daily  . pantoprazole sodium  40 mg Oral Q24H  . polyethylene glycol  17 g Oral Daily  . sodium chloride flush  10-40 mL Intracatheter Q12H  . thiamine  100 mg Oral Daily   Or  . thiamine  100 mg Intravenous Daily   Continuous Infusions: . amiodarone 30 mg/hr (01/18/20 2137)  .  ceFAZolin (ANCEF) IV 2 g (01/19/20 0522)  . diltiazem (CARDIZEM) infusion 15 mg/hr (01/19/20 0419)   PRN Meds: acetaminophen, acetaminophen, glycopyrrolate, iohexol, LORazepam, ondansetron **OR** ondansetron (ZOFRAN) IV, phenol, sodium chloride flush, traMADol   Vital Signs    Vitals:   01/19/20 0300 01/19/20 0500 01/19/20 0600 01/19/20 0700  BP:  (!) 98/50 108/66 108/73  Pulse: 89 80 82 83  Resp: (!) 24 (!) 30 (!) 27 (!) 28  Temp:  98 F (36.7 C)    TempSrc:      SpO2: 93% 94% 96% 92%  Weight:  130 kg    Height:        Intake/Output Summary (Last 24 hours) at 01/19/2020 0756 Last data filed at 01/18/2020 1811 Gross per 24 hour  Intake 2546.55 ml  Output --  Net 2546.55 ml   Last 3 Weights 01/19/2020 01/18/2020 01/17/2020  Weight (lbs) 286 lb 9.6 oz 284 lb 6.3 oz 284 lb 6.3 oz  Weight (kg) 130 kg 129 kg 129 kg      Telemetry    afib variable rates - Personally Reviewed  ECG    n/a- Personally Reviewed  Physical Exam   GEN: No acute distress.   Neck: No JVD Cardiac: irreg, tachy Respiratory: Clear to auscultation bilaterally. GI: Soft, nontender,  non-distended  MS: No edema; No deformity. Neuro:  Nonfocal  Psych: Normal affect   Labs    High Sensitivity Troponin:   Recent Labs  Lab 01/11/20 1914 01/11/20 2112  TROPONINIHS 24* 23*      Chemistry Recent Labs  Lab 01/16/20 0524 01/16/20 0524 01/17/20 0857 01/18/20 0555 01/19/20 0416  NA 134*   < > 134* 134* 136  K 3.0*   < > 3.2* 3.7 3.2*  CL 103   < > 101 101 103  CO2 23   < > 24 22 24   GLUCOSE 115*   < > 103* 105* 98  BUN 10   < > 5* <5* <5*  CREATININE 0.48*   < > 0.43* 0.37* 0.53*  CALCIUM 7.8*   < > 8.0* 8.2* 8.5*  PROT 5.5*  --  5.8*  --  6.3*  ALBUMIN 2.4*  --  2.6*  --  3.0*  AST 152*  --  111*  --  57*  ALT 71*  --  63*  --  43  ALKPHOS 133*  --  127*  --  117  BILITOT 2.9*  --  3.0*  --  1.8*  GFRNONAA >60   < > >60 >60 >60  GFRAA >60   < > >60 >60 >60  ANIONGAP 8   < > 9 11 9    < > = values in this interval not displayed.     Hematology Recent Labs  Lab 01/17/20 0857 01/18/20 0555 01/19/20 0416  WBC 5.5 5.3 5.7  RBC 3.18* 3.21* 3.70*  HGB 11.3* 11.2* 12.8*  HCT 33.6* 34.7* 40.0  MCV 105.7* 108.1* 108.1*  MCH 35.5* 34.9* 34.6*  MCHC 33.6 32.3 32.0  RDW 15.7* 15.7* 15.8*  PLT 100* 135* 183    BNPNo results for input(s): BNP, PROBNP in the last 168 hours.   DDimer No results for input(s): DDIMER in the last 168 hours.   Radiology    No results found.  Cardiac Studies     Patient Profile     Omar Mccarthy a 29 y.o.malewith a hx of HTN, EtOH abuse, fatty liverwho is being seen today for the evaluation of SOBat the request of Dr 30.  Assessment & Plan    1. Afib with RVR - new diagnosis this admission in the setting of severe EtoH withdrawal, sepsis - CHADS2Vasc score is 2, had not started anticoag due to chronic EtoH use and thrombocytopenia - difficultly controlling rates. Currently on dilt gtt at 15, amio 30mg /hr, oral metoprolol 100mg  bid. Rebolused amio 150mg  yesterday.  Would plan only for short course of  amio in general  With improved platelets and potential need for cardioversion started eliquis yesterday  - concern about long term anticoag given EtOH abuse history and recent thrombocytopenia, concern about compliance and stroke risk in the immediate post conversion period.   -rates ok today, still on higher dose dilt gtt. On lopressor 100mg  bid. Soft bp's at times limit av nodal agent dosing. WIll stop amio, load with IV digoxin. If can get reasonably rate controlled would see if he could demonstrate compliance with anticoag for 3 week to consider outpatient DCCV.    2. Acute systolic HF - - echo LVEF 40%, mod RV dysfunction - likely EtOH mediated vs tachy mediated CM. No plan for ischemic testing.  - no further diuresis at this time - hold on ACE/ARB/ARNI as need room with bp for av nodal agents at this time with his afib - consolidated lopressor to toprol prior to discharge  3. Sepsis - per primary team  4. EtOH withdrawal  - per primary team   5. Thrombocytopenia - improving.   For questions or updates, please contact [facility] Please consult [email or web address] for contact info under        Signed, Kerry Hough, MD  01/19/2020, 7:56 AM

## 2020-01-19 NOTE — Progress Notes (Signed)
Patient Demographics:    Omar Mccarthy, is a 29 y.o. male, DOB - 1990/07/12, SKA:[id number]  Admit date - 01/11/2020   Admitting Physician Bernadette Hoit, DO  Outpatient Primary MD for the patient is [facility]  LOS - 8   Chief Complaint  Patient presents with  . Leg Pain        Subjective:    Omar Mccarthy today has no fevers, no emesis,  No chest pain,   --Rate control remains challenging on IV Cardizem and other agents -No further confusional episodes  Assessment  & Plan :    Principal Problem:   Fluid overload Active Problems:   Alcohol abuse with intoxication (HCC)   Leg swelling   Respiratory failure requiring intubation (HCC)   Lack of intravenous access  Brief Summary:- 29 y.o. male with medical history significant for Etoh abuse and HTN admitted on 01/11/2020 with acute anemia, Thrombocytopenia, elevated LFTs in the setting of acute alcohol intoxication and found to have new onset atrial fibrillation -Rate control remains challenging  A/p 1) new onset atrial fibrillation--- with RVR --rate control remains challenging diagnosis is new this admission, EF 40% CHA2DS2-VASc score 2 -Thrombocytopenia is resolved, -Digoxin load on 01/19/2020 -Off amiodarone as of 01/19/2020 -Continue p.o. metoprolol and IV Cardizem drip -May need cardioversion as outpatient if able to demonstrate compliance with anticoagulation -Plan to possibly discharge on p.o. metoprolol XL -Continue Eliquis (started this admission)  2)HFrEF--patient presenting with acute systolic dysfunction CHF, echo with EF of 40%, with moderate RV dysfunction, suspect due to combination of alcohol abuse and tachycardia mediated cardiomyopathy in the setting of A. fib with RVR avoid ACEI/ARB/ARNI due to soft BP with  need for AV nodal blocking agents  3)DTs--alcohol withdrawal symptoms appears to be  resolving, continue multivitamin and as needed lorazepam  4) acute hypoxic respiratory failure in the setting of DTs requiring intubation--patient self extubated on 01/16/2020 - 5)Sepsis----patient met sepsis criteria during his hospitalization---) due to left lower extremity cellulitis, blood cultures with 1 out of 2 with staph hominis--- ID physician Dr. Graylon Good recommends IV Ancef while inpatient and then discharged on p.o. Keflex to complete 2 weeks of antibiotics  6)Thrombocytopenia--- suspect due to direct toxic effects of alcohol on the bone marrow, resolved with cessation of alcohol -Platelets are up to 183 k from a low of 62 k this admission  7)Alcoholic hepatitis with elevated LFTs----AST is down from a high of 209 to 57,  ALT is down from a high of 94 to 43 -AST to ALT ratio has been mostly more than 2 :1 consistent with alcoholic hepatitis -Abdominal ultrasound without acute findings and acute viral hepatitis profile negative  Disposition/Need for in-Hospital Stay- patient unable to be discharged at this time due to --- IV access for sepsis and possible bacteremia, IV Cardizem drip for rate control in A. fib  Status is: Inpatient  Remains inpatient appropriate because: IV access for sepsis and possible bacteremia, IV Cardizem drip for rate control in A. fib  Disposition: The patient is from: Home              Anticipated d/c is to: Home              Anticipated d/c date is:  2 days              Patient currently is not medically stable to d/c. Barriers: Not Clinically Stable-  IV access for sepsis and possible bacteremia, IV Cardizem drip for rate control in A. fib  Code Status : Full code  Family Communication:   (patient is alert, awake and coherent)   Consultants:   PCCM  Gen Surg for central line placement  Cardiology  Procedures:   Echo: EF 40% with global hypokinesis  ETT 8/19>8/22  CVC left subclavian 8/21>  Antimicrobials:  IV Ancef since 818  DVT  Prophylaxis  : Apixaban- SCDs   Lab Results  Component Value Date   PLT 183 01/19/2020    Inpatient Medications  Scheduled Meds: . apixaban  5 mg Oral BID  . Chlorhexidine Gluconate Cloth  6 each Topical Daily  . digoxin  0.25 mg Intravenous Q6H  . docusate sodium  100 mg Oral BID  . folic acid  1 mg Oral Daily  . metoprolol tartrate  100 mg Oral BID  . multivitamin with minerals  1 tablet Oral Daily  . nicotine  21 mg Transdermal Daily  . pantoprazole sodium  40 mg Oral Q24H  . polyethylene glycol  17 g Oral Daily  . potassium chloride  40 mEq Oral Q6H  . sodium chloride flush  10-40 mL Intracatheter Q12H  . thiamine  100 mg Oral Daily   Or  . thiamine  100 mg Intravenous Daily   Continuous Infusions: .  ceFAZolin (ANCEF) IV Stopped (01/19/20 0552)  . diltiazem (CARDIZEM) infusion 5 mg/hr (01/19/20 1000)   PRN Meds:.acetaminophen, acetaminophen, glycopyrrolate, iohexol, LORazepam, ondansetron **OR** ondansetron (ZOFRAN) IV, phenol, sodium chloride flush, traMADol    Anti-infectives (From admission, onward)   Start     Dose/Rate Route Frequency Ordered Stop   01/12/20 1400  ceFAZolin (ANCEF) IVPB 2g/100 mL premix        2 g 200 mL/hr over 30 Minutes Intravenous Every 8 hours 01/12/20 1133          Objective:   Vitals:   01/19/20 0800 01/19/20 0819 01/19/20 0900 01/19/20 1000  BP: 107/68  129/77   Pulse: 84 82 82 78  Resp: (!) 26 (!) 21 (!) 29 (!) 23  Temp: 98.1 F (36.7 C)     TempSrc: Oral     SpO2: 96% 95% 97% 95%  Weight:      Height:        Wt Readings from Last 3 Encounters:  01/19/20 130 kg  08/04/19 117.9 kg     Intake/Output Summary (Last 24 hours) at 01/19/2020 1141 Last data filed at 01/19/2020 1000 Gross per 24 hour  Intake 2845.97 ml  Output 300 ml  Net 2545.97 ml     Physical Exam  Gen:- Awake Alert,  In no apparent distress  HEENT:- [place].AT, No sclera icterus Neck-Supple Neck,No JVD,.  Lungs-  CTAB , fair symmetrical air  movement CV- S1, S2 normal, irregularly irregular, tachycardic at times  abd-  +ve B.Sounds, Abd Soft, No tenderness,    Extremity/Skin:- No  edema, pedal pulses present  Psych-affect is appropriate, oriented x3 Neuro-no new focal deficits, no tremors   Data Review:   Micro Results Recent Results (from the past 240 hour(s))  SARS Coronavirus 2 by RT PCR (hospital order, performed in [facility] hospital lab) Nasopharyngeal Nasopharyngeal Swab     Status: None   Collection Time: 01/11/20  7:01 PM   Specimen: Nasopharyngeal Swab  Result Value Ref Range Status   SARS Coronavirus 2 NEGATIVE NEGATIVE Final    Comment: (NOTE) SARS-CoV-2 target nucleic acids are NOT DETECTED.  The SARS-CoV-2 RNA is generally detectable in upper and lower respiratory specimens during the acute phase of infection. The lowest concentration of SARS-CoV-2 viral copies this assay can detect is 250 copies / mL. A negative result does not preclude SARS-CoV-2 infection and should not be used as the sole basis for treatment or other patient management decisions.  A negative result may occur with improper specimen collection / handling, submission of specimen other than nasopharyngeal swab, presence of viral mutation(s) within the areas targeted by this assay, and inadequate number of viral copies (<250 copies / mL). A negative result must be combined with clinical observations, patient history, and epidemiological information.  Fact Sheet for Patients:   StrictlyIdeas.no  Fact Sheet for Healthcare Providers: BankingDealers.co.za  This test is not yet approved or  cleared by the [place] FDA and has been authorized for detection and/or diagnosis of SARS-CoV-2 by FDA under an Emergency Use Authorization (EUA).  This EUA will remain in effect (meaning this test can be used) for the duration of the COVID-19 declaration under Section 564(b)(1) of the Act, 21  U.S.C. section 360bbb-3(b)(1), unless the authorization is terminated or revoked sooner.  Performed at [facility], [address] [place], [place] [address]   Culture, blood (routine x 2)     Status: None   Collection Time: 01/11/20  7:14 PM   Specimen: BLOOD RIGHT ARM  Result Value Ref Range Status   Specimen Description BLOOD RIGHT ARM  Final   Special Requests   Final    BOTTLES DRAWN AEROBIC AND ANAEROBIC Blood Culture adequate volume   Culture   Final    NO GROWTH 5 DAYS Performed at [facility], [address], [place], [place] [address]    Report Status 01/16/2020 FINAL  Final  Culture, blood (routine x 2)     Status: Abnormal   Collection Time: 01/11/20  7:24 PM   Specimen: BLOOD RIGHT FOREARM  Result Value Ref Range Status   Specimen Description   Final    BLOOD RIGHT FOREARM Performed at [facility], [address], [place], [place] [address]    Special Requests   Final    BOTTLES DRAWN AEROBIC AND ANAEROBIC Blood Culture adequate volume Performed at [facility], [address] [place], [place] [address]    Culture  Setup Time   Final    GRAM POSITIVE COCCI aerobic bottle Gram Stain Report Called to,Read Back By and Verified With: HILTON, L@ 3005 by MATTHEWS, B 8.18.21 [facility] Organism ID to follow CRITICAL RESULT CALLED TO, READ BACK BY AND VERIFIED WITH: H TETREAULT RN 01/12/20 2254 JDW    Culture (A)  Final    STAPHYLOCOCCUS HOMINIS THE SIGNIFICANCE OF ISOLATING THIS ORGANISM FROM A SINGLE SET OF BLOOD CULTURES WHEN MULTIPLE SETS ARE DRAWN IS UNCERTAIN. PLEASE NOTIFY THE MICROBIOLOGY DEPARTMENT WITHIN ONE WEEK IF SPECIATION AND SENSITIVITIES ARE REQUIRED. Performed at [facility], [place] [address] [place], [place] [address]    Report Status 01/13/2020 FINAL  Final  Blood Culture ID Panel (Reflexed)     Status: Abnormal   Collection Time: 01/11/20  7:24 PM  Result Value Ref Range Status   Enterococcus faecalis NOT DETECTED NOT DETECTED Final    Enterococcus Faecium NOT DETECTED NOT DETECTED Final   Listeria monocytogenes NOT DETECTED NOT DETECTED Final  Staphylococcus species DETECTED (A) NOT DETECTED Final    Comment: CRITICAL RESULT CALLED TO, READ BACK BY AND VERIFIED WITH: H TETREAULT RN 01/12/20 2254 JDW    Staphylococcus aureus (BCID) NOT DETECTED NOT DETECTED Final   Staphylococcus epidermidis NOT DETECTED NOT DETECTED Final   Staphylococcus lugdunensis NOT DETECTED NOT DETECTED Final   Streptococcus species NOT DETECTED NOT DETECTED Final   Streptococcus agalactiae NOT DETECTED NOT DETECTED Final   Streptococcus pneumoniae NOT DETECTED NOT DETECTED Final   Streptococcus pyogenes NOT DETECTED NOT DETECTED Final   A.calcoaceticus-baumannii NOT DETECTED NOT DETECTED Final   Bacteroides fragilis NOT DETECTED NOT DETECTED Final   Enterobacterales NOT DETECTED NOT DETECTED Final   Enterobacter cloacae complex NOT DETECTED NOT DETECTED Final   Escherichia coli NOT DETECTED NOT DETECTED Final   Klebsiella aerogenes NOT DETECTED NOT DETECTED Final   Klebsiella oxytoca NOT DETECTED NOT DETECTED Final   Klebsiella pneumoniae NOT DETECTED NOT DETECTED Final   Proteus species NOT DETECTED NOT DETECTED Final   Salmonella species NOT DETECTED NOT DETECTED Final   Serratia marcescens NOT DETECTED NOT DETECTED Final   Haemophilus influenzae NOT DETECTED NOT DETECTED Final   Neisseria meningitidis NOT DETECTED NOT DETECTED Final   Pseudomonas aeruginosa NOT DETECTED NOT DETECTED Final   Stenotrophomonas maltophilia NOT DETECTED NOT DETECTED Final   Candida albicans NOT DETECTED NOT DETECTED Final   Candida auris NOT DETECTED NOT DETECTED Final   Candida glabrata NOT DETECTED NOT DETECTED Final   Candida krusei NOT DETECTED NOT DETECTED Final   Candida parapsilosis NOT DETECTED NOT DETECTED Final   Candida tropicalis NOT DETECTED NOT DETECTED Final   Cryptococcus neoformans/gattii NOT DETECTED NOT DETECTED Final    Comment:  Performed at [facility], [address], [place], [place] [address]  MRSA PCR Screening     Status: None   Collection Time: 01/12/20  6:45 AM   Specimen: Nasal Mucosa; Nasopharyngeal  Result Value Ref Range Status   MRSA by PCR NEGATIVE NEGATIVE Final    Comment:        The GeneXpert MRSA Assay (FDA approved for NASAL specimens only), is one component of a comprehensive MRSA colonization surveillance program. It is not intended to diagnose MRSA infection nor to guide or monitor treatment for MRSA infections. Performed at [facility], [address] [place], [place] [address]   Culture, blood (routine x 2)     Status: None   Collection Time: 01/13/20 12:16 PM   Specimen: BLOOD  Result Value Ref Range Status   Specimen Description BLOOD LEFT ANTECUBITAL  Final   Special Requests   Final    BOTTLES DRAWN AEROBIC AND ANAEROBIC Blood Culture adequate volume   Culture   Final    NO GROWTH 6 DAYS Performed at [facility], [address] [place], [place] [address]    Report Status 01/19/2020 FINAL  Final  Culture, blood (routine x 2)     Status: None   Collection Time: 01/13/20 12:16 PM   Specimen: BLOOD  Result Value Ref Range Status   Specimen Description BLOOD LEFT HAND  Final   Special Requests   Final    BOTTLES DRAWN AEROBIC AND ANAEROBIC Blood Culture adequate volume   Culture   Final    NO GROWTH 6 DAYS Performed at [facility], [address], [place], [place] [address]    Report Status 01/19/2020 FINAL  Final  Culture, blood (routine x 2)     Status: None   Collection  Time: 01/14/20  4:56 AM   Specimen: BLOOD LEFT HAND  Result Value Ref Range Status   Specimen Description BLOOD LEFT HAND  Final   Special Requests   Final    BOTTLES DRAWN AEROBIC AND ANAEROBIC Blood Culture adequate volume   Culture   Final    NO GROWTH 5 DAYS Performed at [facility], [address] [place], [place] [address]    Report Status 01/19/2020 FINAL  Final  Culture, blood  (routine x 2)     Status: None   Collection Time: 01/14/20  5:05 AM   Specimen: BLOOD LEFT HAND  Result Value Ref Range Status   Specimen Description BLOOD LEFT HAND  Final   Special Requests   Final    BOTTLES DRAWN AEROBIC AND ANAEROBIC Blood Culture adequate volume   Culture   Final    NO GROWTH 5 DAYS Performed at [facility], [address], [place], [place] [address]    Report Status 01/19/2020 FINAL  Final    Radiology Reports CT ANGIO CHEST PE W OR WO CONTRAST  Result Date: 01/12/2020 CLINICAL DATA:  Bilateral lower extremity swelling, hypertension, vomiting and difficulty breathing since March, 2021. EXAM: CT ANGIOGRAPHY CHEST WITH CONTRAST TECHNIQUE: Multidetector CT imaging of the chest was performed using the standard protocol during bolus administration of intravenous contrast. Multiplanar CT image reconstructions and MIPs were obtained to evaluate the vascular anatomy. CONTRAST:  100 mL OMNIPAQUE IOHEXOL 350 MG/ML SOLN COMPARISON:  PA and lateral chest today and single-view of the chest 08/04/2019. FINDINGS: Cardiovascular: Satisfactory opacification of the pulmonary arteries to the segmental level. No evidence of pulmonary embolism. Normal heart size. There is cardiomegaly. No pericardial effusion. Mediastinum/Nodes: No enlarged mediastinal, hilar, or axillary lymph nodes. Thyroid gland, trachea, and esophagus demonstrate no significant findings. Lungs/Pleura: Trace pleural effusions. Mild dependent atelectasis is present. Upper Abdomen: There is marked, diffuse fatty infiltration of the visualized liver. Mild stranding is seen about the pancreas. Musculoskeletal: No acute or focal abnormality. Review of the MIP images confirms the above findings. IMPRESSION: Negative for pulmonary embolus. Trace pleural effusions. Severe fatty infiltration of the liver. Mild stranding about the visualized pancreas may be due to pancreatitis. Cardiomegaly. Electronically Signed   By: Inge Rise  M.D.   On: 01/12/2020 12:57   US Venous Img Lower Unilateral Left  Result Date: 01/11/2020 CLINICAL DATA:  Lower extremity pain and edema EXAM: LEFT LOWER EXTREMITY VENOUS DUPLEX ULTRASOUND TECHNIQUE: Gray-scale sonography with graded compression, as well as color Doppler and duplex ultrasound were performed to evaluate the left lower extremity deep venous system from the level of the common femoral vein and including the common femoral, femoral, profunda femoral, popliteal and calf veins including the posterior tibial, peroneal and gastrocnemius veins when visible. The superficial great saphenous vein was also interrogated. Spectral Doppler was utilized to evaluate flow at rest and with distal augmentation maneuvers in the common femoral, femoral and popliteal veins. COMPARISON:  None. FINDINGS: Contralateral Common Femoral Vein: Respiratory phasicity is normal and symmetric with the symptomatic side. No evidence of thrombus. Normal compressibility. Common Femoral Vein: No evidence of thrombus. Normal compressibility, respiratory phasicity and response to augmentation. Saphenofemoral Junction: No evidence of thrombus. Normal compressibility and flow on color Doppler imaging. Profunda Femoral Vein: No evidence of thrombus. Normal compressibility and flow on color Doppler imaging. Femoral Vein: No evidence of thrombus. Normal compressibility, respiratory phasicity and response to augmentation. Popliteal Vein: No evidence of thrombus. Normal compressibility, respiratory phasicity and response to augmentation.  Calf Veins: No evidence of thrombus. Normal compressibility and flow on color Doppler imaging. Superficial Great Saphenous Vein: No evidence of thrombus. Normal compressibility. Venous Reflux:  None. Other Findings:  There is calf region edema. IMPRESSION: No evidence of deep venous thrombosis in the left lower extremity. Right common femoral vein patent. There is left lower extremity calf region edema.  Electronically Signed   By: Lowella Grip III M.D.   On: 01/11/2020 15:19   DG CHEST PORT 1 VIEW  Result Date: 01/16/2020 CLINICAL DATA:  29 year old male with history of acute respiratory failure. EXAM: PORTABLE CHEST 1 VIEW COMPARISON:  Chest x-ray 01/15/2020. FINDINGS: An endotracheal tube is in place with tip 4.4 cm above the carina. There is a left-sided subclavian central venous catheter with tip terminating in the proximal superior vena cava. A nasogastric tube is seen extending into the stomach, however, the tip of the nasogastric tube extends below the lower margin of the image. Lung volumes are low. Bibasilar opacities may reflect areas of atelectasis and/or consolidation. Small bilateral pleural effusions (right greater than left). Heart size is mildly enlarged. Upper mediastinal contours are within normal limits. IMPRESSION: 1. Support apparatus, as above. 2. Persistent bibasilar areas of atelectasis and/or consolidation with superimposed small bilateral pleural effusions. Electronically Signed   By: Vinnie Langton M.D.   On: 01/16/2020 06:09   DG CHEST PORT 1 VIEW  Result Date: 01/15/2020 CLINICAL DATA:  Central line placement EXAM: PORTABLE CHEST 1 VIEW COMPARISON:  January 15, 2020 FINDINGS: A new left central line terminates in the region the SVC. No pneumothorax. The ETT is in good position. The NG tube terminates below today's film. A right-sided pleural effusion is stable. Bilateral pulmonary opacities, right greater than left are stable. Probable small layering effusion. The cardiomediastinal silhouette is stable. No other acute abnormalities. IMPRESSION: 1. Small bilateral effusions, right greater than left. 2. Support apparatus as above. No pneumothorax after left central line placement. 3. Right greater than left pulmonary opacities remain, mildly improved in the interval. Recommend attention on follow-up. Electronically Signed   By: Dorise Bullion III M.D   On: 01/15/2020 10:48     DG CHEST PORT 1 VIEW  Result Date: 01/15/2020 CLINICAL DATA:  29 year old male with history of acute respiratory failure. EXAM: PORTABLE CHEST 1 VIEW COMPARISON:  Chest x-ray 01/14/2020. FINDINGS: An endotracheal tube is in place with tip 3.8 cm above the carina. A nasogastric tube is seen extending into the stomach, however, the tip of the nasogastric tube extends below the lower margin of the image. Lung volumes are low. Moderate right and small left pleural effusions. Bibasilar opacities (right greater than left), with definite air bronchograms on the right. No pneumothorax. Heart size is mildly enlarged. Upper mediastinal contours are within normal limits allowing for patient positioning. IMPRESSION: 1. Support apparatus, as above. 2. Airspace consolidation in the right lower lobe concerning for pneumonia. Additional left basilar atelectasis and/or consolidation. 3. Enlarging moderate right and small left pleural effusions. Electronically Signed   By: Vinnie Langton M.D.   On: 01/15/2020 06:33   DG CHEST PORT 1 VIEW  Result Date: 01/14/2020 CLINICAL DATA:  Intubation EXAM: PORTABLE CHEST 1 VIEW COMPARISON:  Earlier same day FINDINGS: Endotracheal tube tip is 6 cm above the carina. Orogastric or nasogastric tube enters the stomach. Cardiomegaly, bilateral effusions and pulmonary edema appear similar to the previous study. IMPRESSION: Lines and tubes well positioned. Heart failure pattern as seen previously. Electronically Signed   By: Nelson Chimes  M.D.   On: 01/14/2020 21:48   Portable Chest xray  Result Date: 01/14/2020 CLINICAL DATA:  Hypoxia EXAM: PORTABLE CHEST 1 VIEW COMPARISON:  January 13, 2020. FINDINGS: Endotracheal tube tip is 5.4 cm above the carina. Nasogastric tube tip and side port are below the diaphragm. No pneumothorax. There are pleural effusions bilaterally which appear to be partially layering. There is consolidation in both lung bases. No new opacity evident. There is  cardiomegaly with a degree of pulmonary venous hypertension. No adenopathy evident. No bone lesions. IMPRESSION: Tube positions as described without pneumothorax. Cardiomegaly with pulmonary vascular congestion and pleural effusion. Suspect a degree of congestive heart failure. Airspace consolidation is noted in both lung bases, primarily medially. Suspect combination of atelectasis and pneumonia in these areas. A degree of alveolar edema may also be present in these areas. The appearance of the lungs is similar to 1 day prior. Electronically Signed   By: Lowella Grip III M.D.   On: 01/14/2020 08:07   DG CHEST PORT 1 VIEW  Result Date: 01/13/2020 CLINICAL DATA:  Post intubation EXAM: PORTABLE CHEST 1 VIEW COMPARISON:  01/11/2020, CT 01/12/2020 FINDINGS: Cardiomegaly with vascular congestion and hazy perihilar opacities suspicious for mild pulmonary edema. At least small bilateral effusions. Endotracheal tube tip is about 4.3 cm superior to carina. Esophageal tube tip is below the diaphragm but is incompletely visualized. IMPRESSION: 1. Cardiomegaly with vascular congestion and hazy perihilar opacities suspicious for mild pulmonary edema. At least small bilateral effusions. 2. Endotracheal tube tip about 4.3 cm superior to carina. Electronically Signed   By: Donavan Foil M.D.   On: 01/13/2020 20:17   DG Chest Port 1 View  Result Date: 01/11/2020 CLINICAL DATA:  29 year old male with history of tachycardia and leg swelling. EXAM: PORTABLE CHEST 1 VIEW COMPARISON:  Chest x-ray 08/04/2019. FINDINGS: Lung volumes are normal. No consolidative airspace disease. No pleural effusions. No pneumothorax. No pulmonary nodule or mass noted. Pulmonary vasculature and the cardiomediastinal silhouette are within normal limits. IMPRESSION: No radiographic evidence of acute cardiopulmonary disease. Electronically Signed   By: Vinnie Langton M.D.   On: 01/11/2020 19:58   ECHOCARDIOGRAM COMPLETE  Result Date:  01/13/2020    ECHOCARDIOGRAM REPORT   Patient Name:   Omar Mccarthy Keiffer Date of Exam: 01/13/2020 Medical Rec #:  [id number]        Height:       67.0 in Accession #:    [id number]       Weight:       277.3 lb Date of Birth:  1991/03/07        BSA:          2.324 m Patient Age:    28 years         BP:           141/117 mmHg Patient Gender: M                HR:           95 bpm. Exam Location:  [facility] Procedure: Intracardiac Opacification Agent Indications:    Leg swelling [[id number]  History:        Patient has no prior history of Echocardiogram examinations.                 Risk Factors:Current Smoker. ETOH, Leg Swelling.  Sonographer:    Leavy Cella RDCS (AE) Referring Phys: [place]  1. Left ventricular ejection fraction, by estimation, is approximately 40%. The left  ventricle has moderately decreased function. The left ventricle demonstrates global hypokinesis with some regionla variation. There is moderate left ventricular hypertrophy. Left ventricular diastolic parameters are indeterminate in the setting of atrial fibrillation. No obvious LV mural thrombus.  2. Right ventricular systolic function is moderately reduced. The right ventricular size is normal. There is normal pulmonary artery systolic pressure. The estimated right ventricular systolic pressure is 33.3 mmHg.  3. The mitral valve is grossly normal. Mild mitral valve regurgitation.  4. The aortic valve is tricuspid. Aortic valve regurgitation is not visualized.  5. The inferior vena cava is dilated in size with <50% respiratory variability, suggesting right atrial pressure of 15 mmHg. FINDINGS  Left Ventricle: Left ventricular ejection fraction, by estimation, is 40%. The left ventricle has moderately decreased function. The left ventricle demonstrates global hypokinesis. Definity contrast agent was given IV to delineate the left ventricular endocardial borders. The left ventricular internal cavity size was normal in  size. There is moderate left ventricular hypertrophy. Left ventricular diastolic parameters are indeterminate. Right Ventricle: The right ventricular size is normal. No increase in right ventricular wall thickness. Right ventricular systolic function is moderately reduced. There is normal pulmonary artery systolic pressure. The tricuspid regurgitant velocity is 1.86 m/s, and with an assumed right atrial pressure of 15 mmHg, the estimated right ventricular systolic pressure is 54.5 mmHg. Left Atrium: Left atrial size was normal in size. Right Atrium: Right atrial size was normal in size. Pericardium: There is no evidence of pericardial effusion. Mitral Valve: The mitral valve is grossly normal. Mild mitral valve regurgitation. Tricuspid Valve: The tricuspid valve is grossly normal. Tricuspid valve regurgitation is trivial. Aortic Valve: The aortic valve is tricuspid. Aortic valve regurgitation is not visualized. Pulmonic Valve: The pulmonic valve was grossly normal. Pulmonic valve regurgitation is trivial. Aorta: The aortic root is normal in size and structure. Venous: The inferior vena cava is dilated in size with less than 50% respiratory variability, suggesting right atrial pressure of 15 mmHg. IAS/Shunts: No atrial level shunt detected by color flow Doppler.  LEFT VENTRICLE PLAX 2D LVIDd:         3.70 cm  Diastology LVIDs:         2.93 cm  LV e' lateral:   12.90 cm/s LV PW:         1.53 cm  LV E/e' lateral: 8.9 LV IVS:        1.43 cm  LV e' medial:    8.49 cm/s LVOT diam:     2.10 cm  LV E/e' medial:  13.5 LVOT Area:     3.46 cm  RIGHT VENTRICLE RV S prime:     9.57 cm/s TAPSE (M-mode): 1.1 cm LEFT ATRIUM           Index       RIGHT ATRIUM           Index LA diam:      4.70 cm 2.02 cm/m  RA Area:     22.10 cm LA Vol (A2C): 52.7 ml 22.68 ml/m RA Volume:   72.10 ml  31.03 ml/m LA Vol (A4C): 49.8 ml 21.43 ml/m   AORTA Ao Root diam: 2.70 cm MITRAL VALVE                TRICUSPID VALVE MV Area (PHT): 7.51 cm      TR Peak grad:   13.8 mmHg MV Decel Time: 101 msec     TR Vmax:        186.00  cm/s MV E velocity: 115.00 cm/s MV A velocity: 37.20 cm/s   SHUNTS MV E/A ratio:  3.09         Systemic Diam: 2.10 cm Rozann Lesches MD Electronically signed by Rozann Lesches MD Signature Date/Time: 01/13/2020/12:47:33 PM    Final    [place] EKG SITE RITE  Result Date: 01/15/2020 If Site Rite image not attached, placement could not be confirmed due to current cardiac rhythm.  US Abdomen Limited RUQ  Result Date: 01/12/2020 CLINICAL DATA:  Elevated liver enzymes EXAM: ULTRASOUND ABDOMEN LIMITED RIGHT UPPER QUADRANT COMPARISON:  None. FINDINGS: Gallbladder: No gallstones or wall thickening visualized. There is no pericholecystic fluid. No sonographic Murphy sign noted by sonographer. Common bile duct: Diameter: 4 mm. No intrahepatic or extrahepatic biliary duct dilatation. Liver: No focal lesion identified. Liver echogenicity is increased diffusely. Portal vein is patent on color Doppler imaging with normal direction of blood flow towards the liver. Other: None. IMPRESSION: Diffuse increase in liver echogenicity, a finding indicative of hepatic steatosis, potentially with underlying parenchymal liver disease as well. No focal liver lesions are evident; it must be cautioned that the sensitivity of ultrasound for detection of focal liver lesions is diminished in this circumstance. Study otherwise unremarkable. Electronically Signed   By: Lowella Grip III M.D.   On: 01/12/2020 10:12     CBC Recent Labs  Lab 01/15/20 0421 01/16/20 0524 01/17/20 0857 01/18/20 0555 01/19/20 0416  WBC 8.1 7.2 5.5 5.3 5.7  HGB 11.4* 11.4* 11.3* 11.2* 12.8*  HCT 35.0* 35.1* 33.6* 34.7* 40.0  PLT 64* 89* 100* 135* 183  MCV 108.0* 107.7* 105.7* 108.1* 108.1*  MCH 35.2* 35.0* 35.5* 34.9* 34.6*  MCHC 32.6 32.5 33.6 32.3 32.0  RDW 16.1* 16.3* 15.7* 15.7* 15.8*    Chemistries  Recent Labs  Lab 01/14/20 0505 01/14/20 0505 01/14/20 1701  01/15/20 0421 01/15/20 1710 01/16/20 0524 01/17/20 0857 01/18/20 0555 01/19/20 0416  NA 136   < >  --  136  --  134* 134* 134* 136  K 3.6   < >  --  3.5  --  3.0* 3.2* 3.7 3.2*  CL 106   < >  --  106  --  103 101 101 103  CO2 15*   < >  --  21*  --  _0 GLUCOSE 113*   < >  --  84  --  115* 103* 105* 98  BUN 11   < >  --  13  --  10 5* <5* <5*  CREATININE 0.84   < >  --  0.55*  --  0.48* 0.43* 0.37* 0.53*  CALCIUM 7.9*   < >  --  7.7*  --  7.8* 8.0* 8.2* 8.5*  MG  --   --  1.6* 1.7 1.7 1.8  --   --   --   AST 164*  --   --  152*  --  152* 111*  --  57*  ALT 69*  --   --  62*  --  71* 63*  --  43  ALKPHOS 114  --   --  112  --  133* 127*  --  117  BILITOT 2.7*  --   --  3.0*  --  2.9* 3.0*  --  1.8*   < > = values in this interval not displayed.   ------------------------------------------------------------------------------------------------------------------ No results for input(s): CHOL, HDL, LDLCALC, TRIG, CHOLHDL, LDLDIRECT in the last 72 hours.  No results found for: HGBA1C ------------------------------------------------------------------------------------------------------------------ No results for input(s): TSH, T4TOTAL, T3FREE, THYROIDAB in the last 72 hours.  Invalid input(s): FREET3 ------------------------------------------------------------------------------------------------------------------ No results for input(s): VITAMINB12, FOLATE, FERRITIN, TIBC, IRON, RETICCTPCT in the last 72 hours.  Coagulation profile Recent Labs  Lab 01/13/20 0822  INR 1.3*    No results for input(s): DDIMER in the last 72 hours.  Cardiac Enzymes No results for input(s): CKMB, TROPONINI, MYOGLOBIN in the last 168 hours.  Invalid input(s): CK ------------------------------------------------------------------------------------------------------------------    Component Value Date/Time   BNP 279.0 (H) 01/11/2020 1914     Roxan Hockey M.D on 01/19/2020 at 11:41  AM  Go to [email or web address] - for contact info  [facility] - Office  [phone]

## 2020-01-19 NOTE — Progress Notes (Signed)
Pt states IV in left forearm was uncomfortable. Upon assessing site, it was red, edematous and warm to the touch. IV catheter was almost completely out. Removed IV, elevated arm and applied heat pack. Redness extends from mid forearm to mid bicep.

## 2020-01-20 LAB — CBC
HCT: 45.1 % (ref 39.0–52.0)
Hemoglobin: 13.8 g/dL (ref 13.0–17.0)
MCH: 34.6 pg — ABNORMAL HIGH (ref 26.0–34.0)
MCHC: 30.6 g/dL (ref 30.0–36.0)
MCV: 113 fL — ABNORMAL HIGH (ref 80.0–100.0)
Platelets: 259 10*3/uL (ref 150–400)
RBC: 3.99 MIL/uL — ABNORMAL LOW (ref 4.22–5.81)
RDW: 15.5 % (ref 11.5–15.5)
WBC: 8.5 10*3/uL (ref 4.0–10.5)
nRBC: 0 % (ref 0.0–0.2)

## 2020-01-20 MED ORDER — DIGOXIN 125 MCG PO TABS
0.1250 mg | ORAL_TABLET | Freq: Every day | ORAL | Status: DC
  Administered 2020-01-20 – 2020-01-22 (×3): 0.125 mg via ORAL
  Filled 2020-01-20 (×3): qty 1

## 2020-01-20 MED ORDER — DILTIAZEM HCL 60 MG PO TABS
60.0000 mg | ORAL_TABLET | Freq: Three times a day (TID) | ORAL | Status: DC
  Administered 2020-01-20 – 2020-01-21 (×4): 60 mg via ORAL
  Filled 2020-01-20 (×4): qty 1

## 2020-01-20 NOTE — Progress Notes (Signed)
Progress Note  Patient Name: Omar Mccarthy Date of Encounter: 01/20/2020  [facility] Cardiologist: New  Subjective   No complaints  Inpatient Medications    Scheduled Meds: . apixaban  5 mg Oral BID  . Chlorhexidine Gluconate Cloth  6 each Topical Daily  . docusate sodium  100 mg Oral BID  . folic acid  1 mg Oral Daily  . metoprolol tartrate  100 mg Oral BID  . multivitamin with minerals  1 tablet Oral Daily  . nicotine  21 mg Transdermal Daily  . pantoprazole sodium  40 mg Oral Q24H  . polyethylene glycol  17 g Oral Daily  . sodium chloride flush  10-40 mL Intracatheter Q12H  . thiamine  100 mg Oral Daily   Or  . thiamine  100 mg Intravenous Daily   Continuous Infusions: .  ceFAZolin (ANCEF) IV 2 g (01/20/20 0601)  . diltiazem (CARDIZEM) infusion Stopped (01/20/20 0315)   PRN Meds: acetaminophen, acetaminophen, glycopyrrolate, iohexol, LORazepam, ondansetron **OR** ondansetron (ZOFRAN) IV, phenol, sodium chloride flush, traMADol   Vital Signs    Vitals:   01/20/20 0100 01/20/20 0400 01/20/20 0405 01/20/20 0500  BP: (!) 154/106 (!) 160/101  (!) 183/113  Pulse:      Resp: (!) 24 (!) 25  19  Temp:   98.4 F (36.9 C)   TempSrc:   Oral   SpO2:      Weight:    125.7 kg  Height:        Intake/Output Summary (Last 24 hours) at 01/20/2020 0750 Last data filed at 01/19/2020 2300 Gross per 24 hour  Intake 927.73 ml  Output 1075 ml  Net -147.27 ml   Last 3 Weights 01/20/2020 01/19/2020 01/18/2020  Weight (lbs) 277 lb 1.9 oz 286 lb 9.6 oz 284 lb 6.3 oz  Weight (kg) 125.7 kg 130 kg 129 kg      Telemetry    afib variable rates - Personally Reviewed  ECG    n/a - Personally Reviewed  Physical Exam   GEN: No acute distress.   Neck: No JVD Cardiac: irreg, no murmurs, rubs, or gallops.  Respiratory: Clear to auscultation bilaterally. GI: Soft, nontender, non-distended  MS: No edema; No deformity. Neuro:  Nonfocal  Psych: Normal affect   Labs      High Sensitivity Troponin:   Recent Labs  Lab 01/11/20 1914 01/11/20 2112  TROPONINIHS 24* 23*      Chemistry Recent Labs  Lab 01/16/20 0524 01/16/20 0524 01/17/20 0857 01/18/20 0555 01/19/20 0416  NA 134*   < > 134* 134* 136  K 3.0*   < > 3.2* 3.7 3.2*  CL 103   < > 101 101 103  CO2 23   < > 24 22 24   GLUCOSE 115*   < > 103* 105* 98  BUN 10   < > 5* <5* <5*  CREATININE 0.48*   < > 0.43* 0.37* 0.53*  CALCIUM 7.8*   < > 8.0* 8.2* 8.5*  PROT 5.5*  --  5.8*  --  6.3*  ALBUMIN 2.4*  --  2.6*  --  3.0*  AST 152*  --  111*  --  57*  ALT 71*  --  63*  --  43  ALKPHOS 133*  --  127*  --  117  BILITOT 2.9*  --  3.0*  --  1.8*  GFRNONAA >60   < > >60 >60 >60  GFRAA >60   < > >60 >60 >60  ANIONGAP 8   < > 9 11 9    < > = values in this interval not displayed.     Hematology Recent Labs  Lab 01/17/20 0857 01/18/20 0555 01/19/20 0416  WBC 5.5 5.3 5.7  RBC 3.18* 3.21* 3.70*  HGB 11.3* 11.2* 12.8*  HCT 33.6* 34.7* 40.0  MCV 105.7* 108.1* 108.1*  MCH 35.5* 34.9* 34.6*  MCHC 33.6 32.3 32.0  RDW 15.7* 15.7* 15.8*  PLT 100* 135* 183    BNPNo results for input(s): BNP, PROBNP in the last 168 hours.   DDimer No results for input(s): DDIMER in the last 168 hours.   Radiology    No results found.  Cardiac Studies     Patient Profile     Omar Mccarthy a 29 y.o.malewith a hx of HTN, EtOH abuse, fatty liverwho is being seen today for the evaluation of SOBat the request of Dr 30.  Assessment & Plan   1. Afib with RVR - new diagnosis this admission in the setting of severe EtoH withdrawal, sepsis - CHADS2Vasc score is 2, initially did not start anticoag due to chronic EtoH use and thrombocytopenia - difficultly controlling rates. Transiently on amio now off. Has been on dilt gtt, oral lopressor 100mg  bid. Loaded with IV digoxin yesterday.    with improved platelets and potential need for cardioversion we did start eliquis  - concern about long term  anticoag given EtOH abuse history and recent thrombocytopenia, concern about compliance and stroke risk in the immediate post conversion period.If not able to rate control him may have to accept these risks and consider cardioversion  - If can get reasonably rate controlled would see if he could demonstrate compliance with anticoag for 3 week to consider outpatient DCCV. If cannot control would need to consider inpatient TEE/DCCV  - rates 80s to 110s, we will start oral digoxin. Mild LV dysfunction, given limited other options will start oral diltiazem. Continue lopressor 100mg  bid, start digoxin 0.125mg  oral daily.  - would npo at midnight in case TEE/DCCV is required  2. Acute systolic HF -- echo LVEF 40%, mod RV dysfunction - likely EtOH mediated vs tachy mediated CM. No plan for ischemic testing.  - no further diuresis at this time - hold on ACE/ARB/ARNI as need room with bp for av nodal agents at this time with his afib - consolidated lopressor to toprol prior to discharge  3. Sepsis - per primary team  4. EtOH withdrawal  - per primary team   5. Thrombocytopenia - improving, now WNL.   For questions or updates, please contact [facility] Please consult [email or web address] for contact info under        Signed, Kerry Hough, MD  01/20/2020, 7:50 AM

## 2020-01-20 NOTE — Progress Notes (Signed)
Patient Demographics:    Omar Mccarthy, is a 29 y.o. male, DOB - [birth year], EZM:[id number]  Admit date - 01/11/2020   Admitting Physician Bernadette Hoit, DO  Outpatient Primary MD for the patient is [facility]  LOS - 9   Chief Complaint  Patient presents with  . Leg Pain        Subjective:    Omar Mccarthy today has no fevers, no emesis,  No chest pain,   --Rate control remains challenging on IV Cardizem and other agents -No further confusional episodes -When patient stood up at the side of the bed heart rate went up to the 140s to 150s  Assessment  & Plan :    Principal Problem:   New onset a-fib [facility]) Active Problems:   Alcohol abuse with intoxication ([place])   Respiratory failure requiring intubation (HCC)   Fluid overload   Leg swelling   Lack of intravenous access  Brief Summary:- 29 y.o. male with medical history significant for Etoh abuse and HTN admitted on 01/11/2020 with acute anemia, Thrombocytopenia, elevated LFTs in the setting of acute alcohol intoxication and found to have new onset atrial fibrillation -Rate control remains challenging ----heart rate up to the 140s to 150s with minimum activity at bedside  A/p 1) new onset atrial fibrillation--- with RVR --rate control remains challenging diagnosis is new this admission, EF 40% CHA2DS2-VASc score 2 -Thrombocytopenia is resolved, -Digoxin load on 01/19/2020 -Off amiodarone drip as of 01/19/2020 -Continue p.o. metoprolol and IV Cardizem drip -May need TEE and cardioversion as inpatient on 01/21/2020 if unable to achieve adequate heart rate control otherwise if rate control achieved patient may have cardioversion as outpatient if able to demonstrate compliance with anticoagulation -Plan to possibly discharge on p.o. metoprolol XL -Continue Eliquis (started this admission)  2)HFrEF--patient presenting with  acute systolic dysfunction CHF, echo with EF of 40%, with moderate RV dysfunction, suspect due to combination of alcohol abuse and tachycardia mediated cardiomyopathy in the setting of A. fib with RVR avoid ACEI/ARB/ARNI due to soft BP with  need for AV nodal blocking agents  3)DTs--alcohol withdrawal symptoms have now resolved, continue multivitamin and as needed lorazepam  4) acute hypoxic respiratory failure in the setting of DTs requiring intubation--patient self extubated on 01/16/2020 -Patient is currently on room air - 5)Sepsis----patient met sepsis criteria during his hospitalization---) due to left lower extremity cellulitis, blood cultures with 1 out of 2 with staph hominis--- ID physician Dr. Graylon Good recommends IV Ancef while inpatient and then discharged on p.o. Keflex to complete 2 weeks of antibiotics --May be able to get TEE on 01/21/2020 prior to cardioversion  6)Thrombocytopenia--- suspect due to direct toxic effects of alcohol on the bone marrow, resolved with cessation of alcohol -Platelets are up to 183 k from a low of 62 k this admission  7)Alcoholic hepatitis with elevated LFTs----AST is down from a high of 209 to 57,  ALT is down from a high of 94 to 43 -AST to ALT ratio has been mostly more than 2 :1 consistent with alcoholic hepatitis -Abdominal ultrasound without acute findings and acute viral hepatitis profile negative  Disposition/Need for in-Hospital Stay- patient unable to be discharged at this time due to --- IV antibiotics for sepsis and  possible bacteremia, IV Cardizem drip for rate control in A. Fib, may need TEE and cardioversion on 01/21/2020  Status is: Inpatient  Remains inpatient appropriate because:IV antibiotics for sepsis and possible bacteremia, IV Cardizem drip for rate control in A. Fib, may need TEE and cardioversion on 01/21/2020  Disposition: The patient is from: Home              Anticipated d/c is to: Home              Anticipated d/c date is: 2  days              Patient currently is not medically stable to d/c. Barriers: Not Clinically Stable- IV antibiotics for sepsis and possible bacteremia, IV Cardizem drip for rate control in A. Fib, may need TEE and cardioversion on 01/21/2020  Code Status : Full code  Family Communication:   (patient is alert, awake and coherent)   Consultants:   PCCM  Gen Surg for central line placement  Cardiology  Procedures:   Echo: EF 40% with global hypokinesis  ETT 8/19>8/22  CVC left subclavian 8/21>  Antimicrobials:  IV Ancef since 818  DVT Prophylaxis  : Apixaban- SCDs   Lab Results  Component Value Date   PLT 259 01/20/2020    Inpatient Medications  Scheduled Meds: . apixaban  5 mg Oral BID  . Chlorhexidine Gluconate Cloth  6 each Topical Daily  . digoxin  0.125 mg Oral Daily  . diltiazem  60 mg Oral TID  . docusate sodium  100 mg Oral BID  . folic acid  1 mg Oral Daily  . metoprolol tartrate  100 mg Oral BID  . multivitamin with minerals  1 tablet Oral Daily  . nicotine  21 mg Transdermal Daily  . pantoprazole sodium  40 mg Oral Q24H  . polyethylene glycol  17 g Oral Daily  . sodium chloride flush  10-40 mL Intracatheter Q12H  . thiamine  100 mg Oral Daily   Or  . thiamine  100 mg Intravenous Daily   Continuous Infusions: .  ceFAZolin (ANCEF) IV 2 g (01/20/20 0601)   PRN Meds:.acetaminophen, acetaminophen, glycopyrrolate, iohexol, LORazepam, ondansetron **OR** ondansetron (ZOFRAN) IV, phenol, sodium chloride flush, traMADol    Anti-infectives (From admission, onward)   Start     Dose/Rate Route Frequency Ordered Stop   01/12/20 1400  ceFAZolin (ANCEF) IVPB 2g/100 mL premix        2 g 200 mL/hr over 30 Minutes Intravenous Every 8 hours 01/12/20 1133          Objective:   Vitals:   01/20/20 0400 01/20/20 0405 01/20/20 0500 01/20/20 0825  BP: (!) 160/101  (!) 183/113   Pulse:      Resp: (!) 25  19   Temp:  98.4 F (36.9 C)  98 F (36.7 C)    TempSrc:  Oral  Oral  SpO2:      Weight:   125.7 kg   Height:        Wt Readings from Last 3 Encounters:  01/20/20 125.7 kg  08/04/19 117.9 kg     Intake/Output Summary (Last 24 hours) at 01/20/2020 1200 Last data filed at 01/19/2020 2300 Gross per 24 hour  Intake 268.31 ml  Output 775 ml  Net -506.69 ml   Physical Exam  Gen:- Awake Alert,  In no apparent distress  HEENT:- [place].AT, No sclera icterus Neck-Supple Neck,No JVD,.  Lungs-  CTAB , fair symmetrical air movement CV- S1,  S2 normal, irregularly irregular, tachycardic at times  abd-  +ve B.Sounds, Abd Soft, No tenderness,    Extremity/Skin:- No  edema, pedal pulses present  Psych-affect is appropriate, oriented x3 Neuro-no new focal deficits, no tremors   Data Review:   Micro Results Recent Results (from the past 240 hour(s))  SARS Coronavirus 2 by RT PCR (hospital order, performed in [facility] hospital lab) Nasopharyngeal Nasopharyngeal Swab     Status: None   Collection Time: 01/11/20  7:01 PM   Specimen: Nasopharyngeal Swab  Result Value Ref Range Status   SARS Coronavirus 2 NEGATIVE NEGATIVE Final    Comment: (NOTE) SARS-CoV-2 target nucleic acids are NOT DETECTED.  The SARS-CoV-2 RNA is generally detectable in upper and lower respiratory specimens during the acute phase of infection. The lowest concentration of SARS-CoV-2 viral copies this assay can detect is 250 copies / mL. A negative result does not preclude SARS-CoV-2 infection and should not be used as the sole basis for treatment or other patient management decisions.  A negative result may occur with improper specimen collection / handling, submission of specimen other than nasopharyngeal swab, presence of viral mutation(s) within the areas targeted by this assay, and inadequate number of viral copies (<250 copies / mL). A negative result must be combined with clinical observations, patient history, and epidemiological information.  Fact Sheet  for Patients:   StrictlyIdeas.no  Fact Sheet for Healthcare Providers: BankingDealers.co.za  This test is not yet approved or  cleared by the [place] FDA and has been authorized for detection and/or diagnosis of SARS-CoV-2 by FDA under an Emergency Use Authorization (EUA).  This EUA will remain in effect (meaning this test can be used) for the duration of the COVID-19 declaration under Section 564(b)(1) of the Act, 21 U.S.C. section 360bbb-3(b)(1), unless the authorization is terminated or revoked sooner.  Performed at [facility], [address] [place], [place] [address]   Culture, blood (routine x 2)     Status: None   Collection Time: 01/11/20  7:14 PM   Specimen: BLOOD RIGHT ARM  Result Value Ref Range Status   Specimen Description BLOOD RIGHT ARM  Final   Special Requests   Final    BOTTLES DRAWN AEROBIC AND ANAEROBIC Blood Culture adequate volume   Culture   Final    NO GROWTH 5 DAYS Performed at [facility], [address], [place], [place] [address]    Report Status 01/16/2020 FINAL  Final  Culture, blood (routine x 2)     Status: Abnormal   Collection Time: 01/11/20  7:24 PM   Specimen: BLOOD RIGHT FOREARM  Result Value Ref Range Status   Specimen Description   Final    BLOOD RIGHT FOREARM Performed at [facility], [address], [place], [place] [address]    Special Requests   Final    BOTTLES DRAWN AEROBIC AND ANAEROBIC Blood Culture adequate volume Performed at [facility], [address] [place], [place] [address]    Culture  Setup Time   Final    GRAM POSITIVE COCCI aerobic bottle Gram Stain Report Called to,Read Back By and Verified With: HILTON, L@ [phone] by MATTHEWS, B 8.18.21 [facility] Organism ID to follow CRITICAL RESULT CALLED TO, READ BACK BY AND VERIFIED WITH: H TETREAULT RN 01/12/20 2254 JDW    Culture (A)  Final    STAPHYLOCOCCUS HOMINIS THE SIGNIFICANCE OF ISOLATING THIS ORGANISM  FROM A SINGLE SET OF BLOOD CULTURES WHEN MULTIPLE SETS ARE DRAWN IS UNCERTAIN.  PLEASE NOTIFY THE MICROBIOLOGY DEPARTMENT WITHIN ONE WEEK IF SPECIATION AND SENSITIVITIES ARE REQUIRED. Performed at [facility], [place] [address], [place], [place] [address]    Report Status 01/13/2020 FINAL  Final  Blood Culture ID Panel (Reflexed)     Status: Abnormal   Collection Time: 01/11/20  7:24 PM  Result Value Ref Range Status   Enterococcus faecalis NOT DETECTED NOT DETECTED Final   Enterococcus Faecium NOT DETECTED NOT DETECTED Final   Listeria monocytogenes NOT DETECTED NOT DETECTED Final   Staphylococcus species DETECTED (A) NOT DETECTED Final    Comment: CRITICAL RESULT CALLED TO, READ BACK BY AND VERIFIED WITH: H TETREAULT RN 01/12/20 2254 JDW    Staphylococcus aureus (BCID) NOT DETECTED NOT DETECTED Final   Staphylococcus epidermidis NOT DETECTED NOT DETECTED Final   Staphylococcus lugdunensis NOT DETECTED NOT DETECTED Final   Streptococcus species NOT DETECTED NOT DETECTED Final   Streptococcus agalactiae NOT DETECTED NOT DETECTED Final   Streptococcus pneumoniae NOT DETECTED NOT DETECTED Final   Streptococcus pyogenes NOT DETECTED NOT DETECTED Final   A.calcoaceticus-baumannii NOT DETECTED NOT DETECTED Final   Bacteroides fragilis NOT DETECTED NOT DETECTED Final   Enterobacterales NOT DETECTED NOT DETECTED Final   Enterobacter cloacae complex NOT DETECTED NOT DETECTED Final   Escherichia coli NOT DETECTED NOT DETECTED Final   Klebsiella aerogenes NOT DETECTED NOT DETECTED Final   Klebsiella oxytoca NOT DETECTED NOT DETECTED Final   Klebsiella pneumoniae NOT DETECTED NOT DETECTED Final   Proteus species NOT DETECTED NOT DETECTED Final   Salmonella species NOT DETECTED NOT DETECTED Final   Serratia marcescens NOT DETECTED NOT DETECTED Final   Haemophilus influenzae NOT DETECTED NOT DETECTED Final   Neisseria meningitidis NOT DETECTED NOT DETECTED Final   Pseudomonas aeruginosa NOT  DETECTED NOT DETECTED Final   Stenotrophomonas maltophilia NOT DETECTED NOT DETECTED Final   Candida albicans NOT DETECTED NOT DETECTED Final   Candida auris NOT DETECTED NOT DETECTED Final   Candida glabrata NOT DETECTED NOT DETECTED Final   Candida krusei NOT DETECTED NOT DETECTED Final   Candida parapsilosis NOT DETECTED NOT DETECTED Final   Candida tropicalis NOT DETECTED NOT DETECTED Final   Cryptococcus neoformans/gattii NOT DETECTED NOT DETECTED Final    Comment: Performed at [facility], [address] [place], [place] [address]  MRSA PCR Screening     Status: None   Collection Time: 01/12/20  6:45 AM   Specimen: Nasal Mucosa; Nasopharyngeal  Result Value Ref Range Status   MRSA by PCR NEGATIVE NEGATIVE Final    Comment:        The GeneXpert MRSA Assay (FDA approved for NASAL specimens only), is one component of a comprehensive MRSA colonization surveillance program. It is not intended to diagnose MRSA infection nor to guide or monitor treatment for MRSA infections. Performed at [facility], [address], [place], [place] [address]   Culture, blood (routine x 2)     Status: None   Collection Time: 01/13/20 12:16 PM   Specimen: BLOOD  Result Value Ref Range Status   Specimen Description BLOOD LEFT ANTECUBITAL  Final   Special Requests   Final    BOTTLES DRAWN AEROBIC AND ANAEROBIC Blood Culture adequate volume   Culture   Final    NO GROWTH 6 DAYS Performed at [facility], [address], [place], [place] [address]    Report Status 01/19/2020 FINAL  Final  Culture, blood (routine x 2)     Status: None  Collection Time: 01/13/20 12:16 PM   Specimen: BLOOD  Result Value Ref Range Status   Specimen Description BLOOD LEFT HAND  Final   Special Requests   Final    BOTTLES DRAWN AEROBIC AND ANAEROBIC Blood Culture adequate volume   Culture   Final    NO GROWTH 6 DAYS Performed at [facility], [address] [place], [place] [address]    Report Status  01/19/2020 FINAL  Final  Culture, blood (routine x 2)     Status: None   Collection Time: 01/14/20  4:56 AM   Specimen: BLOOD LEFT HAND  Result Value Ref Range Status   Specimen Description BLOOD LEFT HAND  Final   Special Requests   Final    BOTTLES DRAWN AEROBIC AND ANAEROBIC Blood Culture adequate volume   Culture   Final    NO GROWTH 5 DAYS Performed at [facility], [address] [place], [place] [address]    Report Status 01/19/2020 FINAL  Final  Culture, blood (routine x 2)     Status: None   Collection Time: 01/14/20  5:05 AM   Specimen: BLOOD LEFT HAND  Result Value Ref Range Status   Specimen Description BLOOD LEFT HAND  Final   Special Requests   Final    BOTTLES DRAWN AEROBIC AND ANAEROBIC Blood Culture adequate volume   Culture   Final    NO GROWTH 5 DAYS Performed at [facility], [address], [place], [place] [address]    Report Status 01/19/2020 FINAL  Final    Radiology Reports CT ANGIO CHEST PE W OR WO CONTRAST  Result Date: 01/12/2020 CLINICAL DATA:  Bilateral lower extremity swelling, hypertension, vomiting and difficulty breathing since March, 2021. EXAM: CT ANGIOGRAPHY CHEST WITH CONTRAST TECHNIQUE: Multidetector CT imaging of the chest was performed using the standard protocol during bolus administration of intravenous contrast. Multiplanar CT image reconstructions and MIPs were obtained to evaluate the vascular anatomy. CONTRAST:  100 mL OMNIPAQUE IOHEXOL 350 MG/ML SOLN COMPARISON:  PA and lateral chest today and single-view of the chest 08/04/2019. FINDINGS: Cardiovascular: Satisfactory opacification of the pulmonary arteries to the segmental level. No evidence of pulmonary embolism. Normal heart size. There is cardiomegaly. No pericardial effusion. Mediastinum/Nodes: No enlarged mediastinal, hilar, or axillary lymph nodes. Thyroid gland, trachea, and esophagus demonstrate no significant findings. Lungs/Pleura: Trace pleural effusions. Mild dependent  atelectasis is present. Upper Abdomen: There is marked, diffuse fatty infiltration of the visualized liver. Mild stranding is seen about the pancreas. Musculoskeletal: No acute or focal abnormality. Review of the MIP images confirms the above findings. IMPRESSION: Negative for pulmonary embolus. Trace pleural effusions. Severe fatty infiltration of the liver. Mild stranding about the visualized pancreas may be due to pancreatitis. Cardiomegaly. Electronically Signed   By: Inge Rise M.D.   On: 01/12/2020 12:57   US Venous Img Lower Unilateral Left  Result Date: 01/11/2020 CLINICAL DATA:  Lower extremity pain and edema EXAM: LEFT LOWER EXTREMITY VENOUS DUPLEX ULTRASOUND TECHNIQUE: Gray-scale sonography with graded compression, as well as color Doppler and duplex ultrasound were performed to evaluate the left lower extremity deep venous system from the level of the common femoral vein and including the common femoral, femoral, profunda femoral, popliteal and calf veins including the posterior tibial, peroneal and gastrocnemius veins when visible. The superficial great saphenous vein was also interrogated. Spectral Doppler was utilized to evaluate flow at rest and with distal augmentation maneuvers in the common femoral, femoral and popliteal veins. COMPARISON:  None. FINDINGS: Contralateral  Common Femoral Vein: Respiratory phasicity is normal and symmetric with the symptomatic side. No evidence of thrombus. Normal compressibility. Common Femoral Vein: No evidence of thrombus. Normal compressibility, respiratory phasicity and response to augmentation. Saphenofemoral Junction: No evidence of thrombus. Normal compressibility and flow on color Doppler imaging. Profunda Femoral Vein: No evidence of thrombus. Normal compressibility and flow on color Doppler imaging. Femoral Vein: No evidence of thrombus. Normal compressibility, respiratory phasicity and response to augmentation. Popliteal Vein: No evidence of  thrombus. Normal compressibility, respiratory phasicity and response to augmentation. Calf Veins: No evidence of thrombus. Normal compressibility and flow on color Doppler imaging. Superficial Great Saphenous Vein: No evidence of thrombus. Normal compressibility. Venous Reflux:  None. Other Findings:  There is calf region edema. IMPRESSION: No evidence of deep venous thrombosis in the left lower extremity. Right common femoral vein patent. There is left lower extremity calf region edema. Electronically Signed   By: Lowella Grip III M.D.   On: 01/11/2020 15:19   DG CHEST PORT 1 VIEW  Result Date: 01/16/2020 CLINICAL DATA:  29 year old male with history of acute respiratory failure. EXAM: PORTABLE CHEST 1 VIEW COMPARISON:  Chest x-ray 01/15/2020. FINDINGS: An endotracheal tube is in place with tip 4.4 cm above the carina. There is a left-sided subclavian central venous catheter with tip terminating in the proximal superior vena cava. A nasogastric tube is seen extending into the stomach, however, the tip of the nasogastric tube extends below the lower margin of the image. Lung volumes are low. Bibasilar opacities may reflect areas of atelectasis and/or consolidation. Small bilateral pleural effusions (right greater than left). Heart size is mildly enlarged. Upper mediastinal contours are within normal limits. IMPRESSION: 1. Support apparatus, as above. 2. Persistent bibasilar areas of atelectasis and/or consolidation with superimposed small bilateral pleural effusions. Electronically Signed   By: Vinnie Langton M.D.   On: 01/16/2020 06:09   DG CHEST PORT 1 VIEW  Result Date: 01/15/2020 CLINICAL DATA:  Central line placement EXAM: PORTABLE CHEST 1 VIEW COMPARISON:  January 15, 2020 FINDINGS: A new left central line terminates in the region the SVC. No pneumothorax. The ETT is in good position. The NG tube terminates below today's film. A right-sided pleural effusion is stable. Bilateral pulmonary  opacities, right greater than left are stable. Probable small layering effusion. The cardiomediastinal silhouette is stable. No other acute abnormalities. IMPRESSION: 1. Small bilateral effusions, right greater than left. 2. Support apparatus as above. No pneumothorax after left central line placement. 3. Right greater than left pulmonary opacities remain, mildly improved in the interval. Recommend attention on follow-up. Electronically Signed   By: Dorise Bullion III M.D   On: 01/15/2020 10:48   DG CHEST PORT 1 VIEW  Result Date: 01/15/2020 CLINICAL DATA:  29 year old male with history of acute respiratory failure. EXAM: PORTABLE CHEST 1 VIEW COMPARISON:  Chest x-ray 01/14/2020. FINDINGS: An endotracheal tube is in place with tip 3.8 cm above the carina. A nasogastric tube is seen extending into the stomach, however, the tip of the nasogastric tube extends below the lower margin of the image. Lung volumes are low. Moderate right and small left pleural effusions. Bibasilar opacities (right greater than left), with definite air bronchograms on the right. No pneumothorax. Heart size is mildly enlarged. Upper mediastinal contours are within normal limits allowing for patient positioning. IMPRESSION: 1. Support apparatus, as above. 2. Airspace consolidation in the right lower lobe concerning for pneumonia. Additional left basilar atelectasis and/or consolidation. 3. Enlarging moderate right and small left  pleural effusions. Electronically Signed   By: Vinnie Langton M.D.   On: 01/15/2020 06:33   DG CHEST PORT 1 VIEW  Result Date: 01/14/2020 CLINICAL DATA:  Intubation EXAM: PORTABLE CHEST 1 VIEW COMPARISON:  Earlier same day FINDINGS: Endotracheal tube tip is 6 cm above the carina. Orogastric or nasogastric tube enters the stomach. Cardiomegaly, bilateral effusions and pulmonary edema appear similar to the previous study. IMPRESSION: Lines and tubes well positioned. Heart failure pattern as seen previously.  Electronically Signed   By: Nelson Chimes M.D.   On: 01/14/2020 21:48   Portable Chest xray  Result Date: 01/14/2020 CLINICAL DATA:  Hypoxia EXAM: PORTABLE CHEST 1 VIEW COMPARISON:  January 13, 2020. FINDINGS: Endotracheal tube tip is 5.4 cm above the carina. Nasogastric tube tip and side port are below the diaphragm. No pneumothorax. There are pleural effusions bilaterally which appear to be partially layering. There is consolidation in both lung bases. No new opacity evident. There is cardiomegaly with a degree of pulmonary venous hypertension. No adenopathy evident. No bone lesions. IMPRESSION: Tube positions as described without pneumothorax. Cardiomegaly with pulmonary vascular congestion and pleural effusion. Suspect a degree of congestive heart failure. Airspace consolidation is noted in both lung bases, primarily medially. Suspect combination of atelectasis and pneumonia in these areas. A degree of alveolar edema may also be present in these areas. The appearance of the lungs is similar to 1 day prior. Electronically Signed   By: Lowella Grip III M.D.   On: 01/14/2020 08:07   DG CHEST PORT 1 VIEW  Result Date: 01/13/2020 CLINICAL DATA:  Post intubation EXAM: PORTABLE CHEST 1 VIEW COMPARISON:  01/11/2020, CT 01/12/2020 FINDINGS: Cardiomegaly with vascular congestion and hazy perihilar opacities suspicious for mild pulmonary edema. At least small bilateral effusions. Endotracheal tube tip is about 4.3 cm superior to carina. Esophageal tube tip is below the diaphragm but is incompletely visualized. IMPRESSION: 1. Cardiomegaly with vascular congestion and hazy perihilar opacities suspicious for mild pulmonary edema. At least small bilateral effusions. 2. Endotracheal tube tip about 4.3 cm superior to carina. Electronically Signed   By: Donavan Foil M.D.   On: 01/13/2020 20:17   DG Chest Port 1 View  Result Date: 01/11/2020 CLINICAL DATA:  29 year old male with history of tachycardia and leg  swelling. EXAM: PORTABLE CHEST 1 VIEW COMPARISON:  Chest x-ray 08/04/2019. FINDINGS: Lung volumes are normal. No consolidative airspace disease. No pleural effusions. No pneumothorax. No pulmonary nodule or mass noted. Pulmonary vasculature and the cardiomediastinal silhouette are within normal limits. IMPRESSION: No radiographic evidence of acute cardiopulmonary disease. Electronically Signed   By: Vinnie Langton M.D.   On: 01/11/2020 19:58   ECHOCARDIOGRAM COMPLETE  Result Date: 01/13/2020    ECHOCARDIOGRAM REPORT   Patient Name:   JOVAUN LEVENE Blitzer Date of Exam: 01/13/2020 Medical Rec #:  [id number]        Height:       67.0 in Accession #:    [id number]       Weight:       277.3 lb Date of Birth:  09/13/1990        BSA:          2.324 m Patient Age:    28 years         BP:           141/117 mmHg Patient Gender: M                HR:  95 bpm. Exam Location:  [facility] Procedure: Intracardiac Opacification Agent Indications:    Leg swelling [[id number]  History:        Patient has no prior history of Echocardiogram examinations.                 Risk Factors:Current Smoker. ETOH, Leg Swelling.  Sonographer:    Leavy Cella RDCS (AE) Referring Phys: [place]  1. Left ventricular ejection fraction, by estimation, is approximately 40%. The left ventricle has moderately decreased function. The left ventricle demonstrates global hypokinesis with some regionla variation. There is moderate left ventricular hypertrophy. Left ventricular diastolic parameters are indeterminate in the setting of atrial fibrillation. No obvious LV mural thrombus.  2. Right ventricular systolic function is moderately reduced. The right ventricular size is normal. There is normal pulmonary artery systolic pressure. The estimated right ventricular systolic pressure is 94.5 mmHg.  3. The mitral valve is grossly normal. Mild mitral valve regurgitation.  4. The aortic valve is tricuspid. Aortic valve  regurgitation is not visualized.  5. The inferior vena cava is dilated in size with <50% respiratory variability, suggesting right atrial pressure of 15 mmHg. FINDINGS  Left Ventricle: Left ventricular ejection fraction, by estimation, is 40%. The left ventricle has moderately decreased function. The left ventricle demonstrates global hypokinesis. Definity contrast agent was given IV to delineate the left ventricular endocardial borders. The left ventricular internal cavity size was normal in size. There is moderate left ventricular hypertrophy. Left ventricular diastolic parameters are indeterminate. Right Ventricle: The right ventricular size is normal. No increase in right ventricular wall thickness. Right ventricular systolic function is moderately reduced. There is normal pulmonary artery systolic pressure. The tricuspid regurgitant velocity is 1.86 m/s, and with an assumed right atrial pressure of 15 mmHg, the estimated right ventricular systolic pressure is 03.8 mmHg. Left Atrium: Left atrial size was normal in size. Right Atrium: Right atrial size was normal in size. Pericardium: There is no evidence of pericardial effusion. Mitral Valve: The mitral valve is grossly normal. Mild mitral valve regurgitation. Tricuspid Valve: The tricuspid valve is grossly normal. Tricuspid valve regurgitation is trivial. Aortic Valve: The aortic valve is tricuspid. Aortic valve regurgitation is not visualized. Pulmonic Valve: The pulmonic valve was grossly normal. Pulmonic valve regurgitation is trivial. Aorta: The aortic root is normal in size and structure. Venous: The inferior vena cava is dilated in size with less than 50% respiratory variability, suggesting right atrial pressure of 15 mmHg. IAS/Shunts: No atrial level shunt detected by color flow Doppler.  LEFT VENTRICLE PLAX 2D LVIDd:         3.70 cm  Diastology LVIDs:         2.93 cm  LV e' lateral:   12.90 cm/s LV PW:         1.53 cm  LV E/e' lateral: 8.9 LV IVS:         1.43 cm  LV e' medial:    8.49 cm/s LVOT diam:     2.10 cm  LV E/e' medial:  13.5 LVOT Area:     3.46 cm  RIGHT VENTRICLE RV S prime:     9.57 cm/s TAPSE (M-mode): 1.1 cm LEFT ATRIUM           Index       RIGHT ATRIUM           Index LA diam:      4.70 cm 2.02 cm/m  RA Area:     22.10  cm LA Vol (A2C): 52.7 ml 22.68 ml/m RA Volume:   72.10 ml  31.03 ml/m LA Vol (A4C): 49.8 ml 21.43 ml/m   AORTA Ao Root diam: 2.70 cm MITRAL VALVE                TRICUSPID VALVE MV Area (PHT): 7.51 cm     TR Peak grad:   13.8 mmHg MV Decel Time: 101 msec     TR Vmax:        186.00 cm/s MV E velocity: 115.00 cm/s MV A velocity: 37.20 cm/s   SHUNTS MV E/A ratio:  3.09         Systemic Diam: 2.10 cm Rozann Lesches MD Electronically signed by Rozann Lesches MD Signature Date/Time: 01/13/2020/12:47:33 PM    Final    [place] EKG SITE RITE  Result Date: 01/15/2020 If Site Rite image not attached, placement could not be confirmed due to current cardiac rhythm.  US Abdomen Limited RUQ  Result Date: 01/12/2020 CLINICAL DATA:  Elevated liver enzymes EXAM: ULTRASOUND ABDOMEN LIMITED RIGHT UPPER QUADRANT COMPARISON:  None. FINDINGS: Gallbladder: No gallstones or wall thickening visualized. There is no pericholecystic fluid. No sonographic Murphy sign noted by sonographer. Common bile duct: Diameter: 4 mm. No intrahepatic or extrahepatic biliary duct dilatation. Liver: No focal lesion identified. Liver echogenicity is increased diffusely. Portal vein is patent on color Doppler imaging with normal direction of blood flow towards the liver. Other: None. IMPRESSION: Diffuse increase in liver echogenicity, a finding indicative of hepatic steatosis, potentially with underlying parenchymal liver disease as well. No focal liver lesions are evident; it must be cautioned that the sensitivity of ultrasound for detection of focal liver lesions is diminished in this circumstance. Study otherwise unremarkable. Electronically Signed   By: Lowella Grip III M.D.   On: 01/12/2020 10:12     CBC Recent Labs  Lab 01/16/20 0524 01/17/20 0857 01/18/20 0555 01/19/20 0416 01/20/20 0847  WBC 7.2 5.5 5.3 5.7 8.5  HGB 11.4* 11.3* 11.2* 12.8* 13.8  HCT 35.1* 33.6* 34.7* 40.0 45.1  PLT 89* 100* 135* 183 259  MCV 107.7* 105.7* 108.1* 108.1* 113.0*  MCH 35.0* 35.5* 34.9* 34.6* 34.6*  MCHC 32.5 33.6 32.3 32.0 30.6  RDW 16.3* 15.7* 15.7* 15.8* 15.5    Chemistries  Recent Labs  Lab 01/14/20 0505 01/14/20 0505 01/14/20 1701 01/15/20 0421 01/15/20 1710 01/16/20 0524 01/17/20 0857 01/18/20 0555 01/19/20 0416  NA 136   < >  --  136  --  134* 134* 134* 136  K 3.6   < >  --  3.5  --  3.0* 3.2* 3.7 3.2*  CL 106   < >  --  106  --  103 101 101 103  CO2 15*   < >  --  21*  --  _0 GLUCOSE 113*   < >  --  84  --  115* 103* 105* 98  BUN 11   < >  --  13  --  10 5* <5* <5*  CREATININE 0.84   < >  --  0.55*  --  0.48* 0.43* 0.37* 0.53*  CALCIUM 7.9*   < >  --  7.7*  --  7.8* 8.0* 8.2* 8.5*  MG  --   --  1.6* 1.7 1.7 1.8  --   --  1.9  AST 164*  --   --  152*  --  152* 111*  --  57*  ALT 69*  --   --  62*  --  71* 63*  --  43  ALKPHOS 114  --   --  112  --  133* 127*  --  117  BILITOT 2.7*  --   --  3.0*  --  2.9* 3.0*  --  1.8*   < > = values in this interval not displayed.   ------------------------------------------------------------------------------------------------------------------ No results for input(s): CHOL, HDL, LDLCALC, TRIG, CHOLHDL, LDLDIRECT in the last 72 hours.  No results found for: HGBA1C ------------------------------------------------------------------------------------------------------------------ No results for input(s): TSH, T4TOTAL, T3FREE, THYROIDAB in the last 72 hours.  Invalid input(s): FREET3 ------------------------------------------------------------------------------------------------------------------ No results for input(s): VITAMINB12, FOLATE, FERRITIN, TIBC, IRON, RETICCTPCT in the  last 72 hours.  Coagulation profile No results for input(s): INR, PROTIME in the last 168 hours.  No results for input(s): DDIMER in the last 72 hours.  Cardiac Enzymes No results for input(s): CKMB, TROPONINI, MYOGLOBIN in the last 168 hours.  Invalid input(s): CK ------------------------------------------------------------------------------------------------------------------    Component Value Date/Time   BNP 279.0 (H) 01/11/2020 1914   Roxan Hockey M.D on 01/20/2020 at 12:00 PM  Go to [email or web address] - for contact info  [facility] - Office  [phone]

## 2020-01-21 DIAGNOSIS — I4819 Other persistent atrial fibrillation: Secondary | ICD-10-CM

## 2020-01-21 LAB — RENAL FUNCTION PANEL
Albumin: 2.9 g/dL — ABNORMAL LOW (ref 3.5–5.0)
Anion gap: 10 (ref 5–15)
BUN: 6 mg/dL (ref 6–20)
CO2: 19 mmol/L — ABNORMAL LOW (ref 22–32)
Calcium: 8.5 mg/dL — ABNORMAL LOW (ref 8.9–10.3)
Chloride: 104 mmol/L (ref 98–111)
Creatinine, Ser: 0.57 mg/dL — ABNORMAL LOW (ref 0.61–1.24)
GFR calc Af Amer: >60 mL/min (ref >60)
GFR calc non Af Amer: >60 mL/min (ref >60)
Glucose, Bld: 94 mg/dL (ref 70–99)
Phosphorus: 4.2 mg/dL (ref 2.5–4.6)
Potassium: 4.1 mmol/L (ref 3.5–5.1)
Sodium: 133 mmol/L — ABNORMAL LOW (ref 135–145)

## 2020-01-21 LAB — CBC
HCT: 39.7 % (ref 39.0–52.0)
Hemoglobin: 13.1 g/dL (ref 13.0–17.0)
MCH: 34.8 pg — ABNORMAL HIGH (ref 26.0–34.0)
MCHC: 33 g/dL (ref 30.0–36.0)
MCV: 105.6 fL — ABNORMAL HIGH (ref 80.0–100.0)
Platelets: 277 10*3/uL (ref 150–400)
RBC: 3.76 MIL/uL — ABNORMAL LOW (ref 4.22–5.81)
RDW: 14.9 % (ref 11.5–15.5)
WBC: 7 10*3/uL (ref 4.0–10.5)
nRBC: 0 % (ref 0.0–0.2)

## 2020-01-21 MED ORDER — ENSURE ENLIVE PO LIQD: 237.0000 mL | Freq: Two times a day (BID) | ORAL | Status: DC

## 2020-01-21 MED ORDER — DILTIAZEM HCL 60 MG PO TABS
60.0000 mg | ORAL_TABLET | Freq: Four times a day (QID) | ORAL | Status: DC
  Administered 2020-01-21 – 2020-01-22 (×3): 60 mg via ORAL
  Filled 2020-01-21 (×3): qty 1

## 2020-01-21 MED ORDER — LORAZEPAM 2 MG/ML IJ SOLN
1.0000 mg | Freq: Four times a day (QID) | INTRAMUSCULAR | Status: DC | PRN
  Administered 2020-01-21: 1 mg via INTRAVENOUS
  Filled 2020-01-21: qty 1

## 2020-01-21 MED ORDER — PANTOPRAZOLE SODIUM 40 MG PO TBEC
40.0000 mg | DELAYED_RELEASE_TABLET | Freq: Every day | ORAL | Status: DC
  Administered 2020-01-22: 40 mg via ORAL
  Filled 2020-01-21: qty 1

## 2020-01-21 NOTE — Progress Notes (Signed)
Progress Note  Patient Name: Omar Mccarthy Date of Encounter: 01/21/2020  Primary Cardiologist: Dina Rich, MD  Subjective   States that he feels generally better overall.  He worried overnight about cardioversion and tells me this morning that he does not want to pursue the procedure at this time.  He does not report any active palpitations or chest pain.  Inpatient Medications    Scheduled Meds: . apixaban  5 mg Oral BID  . Chlorhexidine Gluconate Cloth  6 each Topical Daily  . digoxin  0.125 mg Oral Daily  . diltiazem  60 mg Oral TID  . docusate sodium  100 mg Oral BID  . folic acid  1 mg Oral Daily  . metoprolol tartrate  100 mg Oral BID  . multivitamin with minerals  1 tablet Oral Daily  . nicotine  21 mg Transdermal Daily  . pantoprazole sodium  40 mg Oral Q24H  . polyethylene glycol  17 g Oral Daily  . sodium chloride flush  10-40 mL Intracatheter Q12H  . thiamine  100 mg Oral Daily   Or  . thiamine  100 mg Intravenous Daily   Continuous Infusions: .  ceFAZolin (ANCEF) IV 2 g (01/21/20 0549)   PRN Meds: acetaminophen, acetaminophen, glycopyrrolate, iohexol, LORazepam, ondansetron **OR** ondansetron (ZOFRAN) IV, phenol, sodium chloride flush, traMADol   Vital Signs    Vitals:   01/21/20 0500 01/21/20 0600 01/21/20 0700 01/21/20 0800  BP:      Pulse:      Resp: (!) 24 (!) 23 (!) 26   Temp:    98.2 F (36.8 C)  TempSrc:    Oral  SpO2:      Weight: 123.7 kg     Height:       No intake or output data in the 24 hours ending 01/21/20 0833 Filed Weights   01/19/20 0500 01/20/20 0500 01/21/20 0500  Weight: 130 kg 125.7 kg 123.7 kg    Telemetry    Atrial fibrillation, heart rate 90s to low 100s.  Personally reviewed.  Physical Exam   GEN:  Obese male.  No acute distress.   Neck: No JVD. Cardiac:  Irregularly irregular, 2/6 systolic murmur, no gallop.  Respiratory: Nonlabored. Clear to auscultation bilaterally. GI: Soft, nontender, bowel  sounds present. MS: No pitting edema; No deformity.  Labs    Chemistry Recent Labs  Lab 01/16/20 [phone] 01/16/20 0524 01/17/20 0857 01/17/20 0857 01/18/20 0555 01/19/20 0416 01/21/20 0457  NA 134*   < > 134*   < > 134* 136 133*  K 3.0*   < > 3.2*   < > 3.7 3.2* 4.1  CL 103   < > 101   < > 101 103 104  CO2 23   < > 24   < > 22 24 19*  GLUCOSE 115*   < > 103*   < > 105* 98 94  BUN 10   < > 5*   < > <5* <5* 6  CREATININE 0.48*   < > 0.43*   < > 0.37* 0.53* 0.57*  CALCIUM 7.8*   < > 8.0*   < > 8.2* 8.5* 8.5*  PROT 5.5*  --  5.8*  --   --  6.3*  --   ALBUMIN 2.4*   < > 2.6*  --   --  3.0* 2.9*  AST 152*  --  111*  --   --  57*  --   ALT 71*  --  63*  --   --  43  --   ALKPHOS 133*  --  127*  --   --  117  --   BILITOT 2.9*  --  3.0*  --   --  1.8*  --   GFRNONAA >60   < > >60   < > >60 >60 >60  GFRAA >60   < > >60   < > >60 >60 >60  ANIONGAP 8   < > 9   < > 11 9 10    < > = values in this interval not displayed.     Hematology Recent Labs  Lab 01/19/20 0416 01/20/20 0847 01/21/20 0457  WBC 5.7 8.5 7.0  RBC 3.70* 3.99* 3.76*  HGB 12.8* 13.8 13.1  HCT 40.0 45.1 39.7  MCV 108.1* 113.0* 105.6*  MCH 34.6* 34.6* 34.8*  MCHC 32.0 30.6 33.0  RDW 15.8* 15.5 14.9  PLT 183 259 277    Cardiac Enzymes Recent Labs  Lab 01/11/20 1914 01/11/20 2112  TROPONINIHS 24* 23*    Radiology    No results found.  Cardiac Studies   Echocardiogram 01/13/2020: 1. Left ventricular ejection fraction, by estimation, is approximately  40%. The left ventricle has moderately decreased function. The left  ventricle demonstrates global hypokinesis with some regionla variation.  There is moderate left ventricular  hypertrophy. Left ventricular diastolic parameters are indeterminate in  the setting of atrial fibrillation. No obvious LV mural thrombus.  2. Right ventricular systolic function is moderately reduced. The right  ventricular size is normal. There is normal pulmonary artery  systolic  pressure. The estimated right ventricular systolic pressure is 28.8 mmHg.  3. The mitral valve is grossly normal. Mild mitral valve regurgitation.  4. The aortic valve is tricuspid. Aortic valve regurgitation is not  visualized.  5. The inferior vena cava is dilated in size with <50% respiratory  variability, suggesting right atrial pressure of 15 mmHg.   Patient Profile     29 y.o. male with a history of hypertension, hepatic steatosis, and alcohol abuse.  Assessment & Plan    1.  Persistent atrial fibrillation, CHA2DS2-VASc score is 2.  He presented with RVR, heart rate control is adequate at this time on combination of metoprolol, Lanoxin, and digoxin.  Eliquis has been initiated as well for stroke prophylaxis.  As outlined above, he is hesitant to pursue TEE cardioversion at this point.  2.  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  Suspect related to alcohol abuse and/or tachycardia-mediated.  3.  Thrombocytopenia, improved and with normal platelet count at present.  4.  Sepsis with evidence of left lower extremity cellulitis, on antibiotics per primary team with clinical improvement.  5.  Alcohol withdrawal.  Chart reviewed.  I discussed the situation with the patient in detail this morning, also spoke with Dr. 37.  He is very hesitant to undergo TEE cardioversion at this time.  His heart rate control is actually reasonable at rest and he is tolerating his current medications well.  I talked with him about the critical importance of alcohol cessation, he states that he is motivated in this regard.  Continue Eliquis, Lanoxin, and metoprolol at current doses.  Increase diltiazem to 60 mg every 6 hours and if tolerated convert to Cardizem CD 240 mg daily tomorrow.  Need to start mobilization to see what his heart rate control with activity will be.  He will need to establish outpatient follow-up and if he is compliant with therapy, an elective  cardioversion could still be considered.  Signed, Nona Dell, MD  01/21/2020, 8:33 AM

## 2020-01-21 NOTE — Progress Notes (Signed)
Patient Demographics:    Omar Mccarthy, is a 29 y.o. male, DOB - 06/07/1990, ZHY:[id number]  Admit date - 01/11/2020   Admitting Physician Bernadette Hoit, DO  Outpatient Primary MD for the patient is [facility], [facility]  LOS - 10   Chief Complaint  Patient presents with  . Leg Pain        Subjective:    [name] today has no fevers, no emesis,  No chest pain,   --Rate control remains challenging  -Patient declines TEE and cardioversion on 01/21/2020 -Cardiology service trying to adjust medications to see if rate control can be better with activity -Heart rates with heart rate mostly less than 110, no palpitations at rest, when patient gets up at the bedside heart rate goes up to 160 and then patient has palpitations  Assessment  & Plan :    Principal Problem:   New onset a-fib [facility]) Active Problems:   Alcohol abuse with intoxication ([place])   Respiratory failure requiring intubation (HCC)   Fluid overload   Leg swelling   Lack of intravenous access  Brief Summary:- 29 y.o. male with medical history significant for Etoh abuse and HTN admitted on 01/11/2020 with acute anemia, Thrombocytopenia, elevated LFTs in the setting of acute alcohol intoxication and found to have new onset atrial fibrillation -Rate control remains challenging ----heart rate up to the 160s with minimum activity at bedside  A/p 1) new onset atrial fibrillation--- with RVR --rate control remains challenging diagnosis is new this admission, EF 40% CHA2DS2-VASc score 2 -Thrombocytopenia is resolved, -Digoxin load on 01/19/2020 -Off amiodarone drip as of 01/19/2020 -Patient declines TEE and cardioversion as inpatient on 01/21/2020  -- patient may have cardioversion as outpatient if able to demonstrate compliance with anticoagulation -Cardizem has been changed to 60 mg every 6 hours, continue digoxin 0.125 mg  daily, continue metoprolol 100 mg twice daily -Plan to possibly discharge on p.o. metoprolol XL and Cardizem CD along with digoxin -Continue Eliquis (started this admission)  2)HFrEF--patient presenting with acute systolic dysfunction CHF, echo with EF of 40%, with moderate RV dysfunction, suspect due to combination of alcohol abuse and tachycardia mediated cardiomyopathy in the setting of A. fib with RVR avoid ACEI/ARB/ARNI due to soft BP with  need for AV nodal blocking agents  3)DTs--alcohol withdrawal symptoms have now resolved, continue multivitamin and as needed lorazepam  4) acute hypoxic respiratory failure in the setting of DTs requiring intubation--patient self extubated on 01/16/2020 -Patient is currently on room air - 5)Sepsis----patient met sepsis criteria during his hospitalization---) due to left lower extremity cellulitis, blood cultures with 1 out of 2 with staph hominis--- ID physician Dr. Graylon Good recommends IV Ancef while inpatient and then discharged on p.o. Keflex to complete 2 weeks of antibiotics  6)Thrombocytopenia--- suspect due to direct toxic effects of alcohol on the bone marrow, resolved with cessation of alcohol -Platelets are up >> 250 k from a low of 62 k this admission  7)Alcoholic hepatitis with elevated LFTs----AST is down from a high of 209 to 57,  ALT is down from a high of 94 to 43 -AST to ALT ratio has been mostly more than 2 :1 consistent with alcoholic hepatitis -Abdominal ultrasound without acute findings and acute viral hepatitis profile  negative  Disposition/Need for in-Hospital Stay- patient unable to be discharged at this time due to --- IV antibiotics for sepsis and possible bacteremia, rate control remains challenging, possible discharge home on 01/22/2020 if rate control improves  Status is: Inpatient  Remains inpatient appropriate because:IV antibiotics for sepsis and possible bacteremia, rate control remains challenging, possible discharge home  on 01/22/2020 if rate control improves  Disposition: The patient is from: Home              Anticipated d/c is to: Home              Anticipated d/c date is: 2 days              Patient currently is not medically stable to d/c.   Barriers: Not Clinically Stable-IV antibiotics for sepsis and possible bacteremia, rate control remains challenging, possible discharge home on 01/22/2020 if rate control improves   Code Status : Full code  Family Communication:   (patient is alert, awake and coherent)   Consultants:   PCCM  Gen Surg for central line placement  Cardiology  Procedures:   Echo: EF 40% with global hypokinesis  ETT 8/19>8/22  CVC left subclavian 8/21>  Antimicrobials:  IV Ancef since 818  DVT Prophylaxis  : Apixaban- SCDs   Lab Results  Component Value Date   PLT 277 01/21/2020    Inpatient Medications  Scheduled Meds: . apixaban  5 mg Oral BID  . Chlorhexidine Gluconate Cloth  6 each Topical Daily  . digoxin  0.125 mg Oral Daily  . diltiazem  60 mg Oral Q6H  . docusate sodium  100 mg Oral BID  . folic acid  1 mg Oral Daily  . metoprolol tartrate  100 mg Oral BID  . multivitamin with minerals  1 tablet Oral Daily  . nicotine  21 mg Transdermal Daily  . [START ON 01/22/2020] pantoprazole  40 mg Oral Daily  . polyethylene glycol  17 g Oral Daily  . thiamine  100 mg Oral Daily   Continuous Infusions: .  ceFAZolin (ANCEF) IV 2 g (01/21/20 0549)   PRN Meds:.acetaminophen, acetaminophen, LORazepam, ondansetron **OR** ondansetron (ZOFRAN) IV    Anti-infectives (From admission, onward)   Start     Dose/Rate Route Frequency Ordered Stop   01/12/20 1400  ceFAZolin (ANCEF) IVPB 2g/100 mL premix        2 g 200 mL/hr over 30 Minutes Intravenous Every 8 hours 01/12/20 1133          Objective:   Vitals:   01/21/20 0800 01/21/20 1000 01/21/20 1100 01/21/20 1200  BP:      Pulse:      Resp: (!) 25 (!) 28 20   Temp: 98.2 F (36.8 C)   98.5 F (36.9 C)    TempSrc: Oral   Oral  SpO2:      Weight:      Height:        Wt Readings from Last 3 Encounters:  01/21/20 123.7 kg  08/04/19 117.9 kg    No intake or output data in the 24 hours ending 01/21/20 1251 Physical Exam  Gen:- Awake Alert,  In no apparent distress  HEENT:- [place].AT, No sclera icterus Neck-Supple Neck,No JVD,.  Lungs-  CTAB , fair symmetrical air movement CV- S1, S2 normal, irregularly irregular, tachycardic  abd-  +ve B.Sounds, Abd Soft, No tenderness,    Extremity/Skin:- No  edema, pedal pulses present  Psych-affect is appropriate, oriented x3 Neuro-no new focal deficits,  no tremors   Data Review:   Micro Results Recent Results (from the past 240 hour(s))  SARS Coronavirus 2 by RT PCR (hospital order, performed in [facility] hospital lab) Nasopharyngeal Nasopharyngeal Swab     Status: None   Collection Time: 01/11/20  7:01 PM   Specimen: Nasopharyngeal Swab  Result Value Ref Range Status   SARS Coronavirus 2 NEGATIVE NEGATIVE Final    Comment: (NOTE) SARS-CoV-2 target nucleic acids are NOT DETECTED.  The SARS-CoV-2 RNA is generally detectable in upper and lower respiratory specimens during the acute phase of infection. The lowest concentration of SARS-CoV-2 viral copies this assay can detect is 250 copies / mL. A negative result does not preclude SARS-CoV-2 infection and should not be used as the sole basis for treatment or other patient management decisions.  A negative result may occur with improper specimen collection / handling, submission of specimen other than nasopharyngeal swab, presence of viral mutation(s) within the areas targeted by this assay, and inadequate number of viral copies (<250 copies / mL). A negative result must be combined with clinical observations, patient history, and epidemiological information.  Fact Sheet for Patients:   StrictlyIdeas.no  Fact Sheet for Healthcare  Providers: BankingDealers.co.za  This test is not yet approved or  cleared by the [place] FDA and has been authorized for detection and/or diagnosis of SARS-CoV-2 by FDA under an Emergency Use Authorization (EUA).  This EUA will remain in effect (meaning this test can be used) for the duration of the COVID-19 declaration under Section 564(b)(1) of the Act, 21 U.S.C. section 360bbb-3(b)(1), unless the authorization is terminated or revoked sooner.  Performed at [facility], [address] [place], [place] [address]   Culture, blood (routine x 2)     Status: None   Collection Time: 01/11/20  7:14 PM   Specimen: BLOOD RIGHT ARM  Result Value Ref Range Status   Specimen Description BLOOD RIGHT ARM  Final   Special Requests   Final    BOTTLES DRAWN AEROBIC AND ANAEROBIC Blood Culture adequate volume   Culture   Final    NO GROWTH 5 DAYS Performed at [facility], [address], [place], [place] [address]    Report Status 01/16/2020 FINAL  Final  Culture, blood (routine x 2)     Status: Abnormal   Collection Time: 01/11/20  7:24 PM   Specimen: BLOOD RIGHT FOREARM  Result Value Ref Range Status   Specimen Description   Final    BLOOD RIGHT FOREARM Performed at [facility], [address], [place], [place] [address]    Special Requests   Final    BOTTLES DRAWN AEROBIC AND ANAEROBIC Blood Culture adequate volume Performed at [facility], [address], [place], [place] [address]    Culture  Setup Time   Final    GRAM POSITIVE COCCI aerobic bottle Gram Stain Report Called to,Read Back By and Verified With: HILTON, L@ [phone] by MATTHEWS, B 8.18.21 [facility] Organism ID to follow CRITICAL RESULT CALLED TO, READ BACK BY AND VERIFIED WITH: H TETREAULT RN 01/12/20 2254 JDW    Culture (A)  Final    STAPHYLOCOCCUS HOMINIS THE SIGNIFICANCE OF ISOLATING THIS ORGANISM FROM A SINGLE SET OF BLOOD CULTURES WHEN MULTIPLE SETS ARE DRAWN IS UNCERTAIN. PLEASE  NOTIFY THE MICROBIOLOGY DEPARTMENT WITHIN ONE WEEK IF SPECIATION AND SENSITIVITIES ARE REQUIRED. Performed at [facility], [place] [address] [place], [place] [address]    Report Status 01/13/2020 FINAL  Final  Blood Culture ID Panel (Reflexed)     Status: Abnormal   Collection Time: 01/11/20  7:24 PM  Result Value Ref Range Status   Enterococcus faecalis NOT DETECTED NOT DETECTED Final   Enterococcus Faecium NOT DETECTED NOT DETECTED Final   Listeria monocytogenes NOT DETECTED NOT DETECTED Final   Staphylococcus species DETECTED (A) NOT DETECTED Final    Comment: CRITICAL RESULT CALLED TO, READ BACK BY AND VERIFIED WITH: H TETREAULT RN 01/12/20 2254 JDW    Staphylococcus aureus (BCID) NOT DETECTED NOT DETECTED Final   Staphylococcus epidermidis NOT DETECTED NOT DETECTED Final   Staphylococcus lugdunensis NOT DETECTED NOT DETECTED Final   Streptococcus species NOT DETECTED NOT DETECTED Final   Streptococcus agalactiae NOT DETECTED NOT DETECTED Final   Streptococcus pneumoniae NOT DETECTED NOT DETECTED Final   Streptococcus pyogenes NOT DETECTED NOT DETECTED Final   A.calcoaceticus-baumannii NOT DETECTED NOT DETECTED Final   Bacteroides fragilis NOT DETECTED NOT DETECTED Final   Enterobacterales NOT DETECTED NOT DETECTED Final   Enterobacter cloacae complex NOT DETECTED NOT DETECTED Final   Escherichia coli NOT DETECTED NOT DETECTED Final   Klebsiella aerogenes NOT DETECTED NOT DETECTED Final   Klebsiella oxytoca NOT DETECTED NOT DETECTED Final   Klebsiella pneumoniae NOT DETECTED NOT DETECTED Final   Proteus species NOT DETECTED NOT DETECTED Final   Salmonella species NOT DETECTED NOT DETECTED Final   Serratia marcescens NOT DETECTED NOT DETECTED Final   Haemophilus influenzae NOT DETECTED NOT DETECTED Final   Neisseria meningitidis NOT DETECTED NOT DETECTED Final   Pseudomonas aeruginosa NOT DETECTED NOT DETECTED Final   Stenotrophomonas maltophilia NOT DETECTED NOT DETECTED  Final   Candida albicans NOT DETECTED NOT DETECTED Final   Candida auris NOT DETECTED NOT DETECTED Final   Candida glabrata NOT DETECTED NOT DETECTED Final   Candida krusei NOT DETECTED NOT DETECTED Final   Candida parapsilosis NOT DETECTED NOT DETECTED Final   Candida tropicalis NOT DETECTED NOT DETECTED Final   Cryptococcus neoformans/gattii NOT DETECTED NOT DETECTED Final    Comment: Performed at [facility], [address], [place], [place] [address]  MRSA PCR Screening     Status: None   Collection Time: 01/12/20  6:45 AM   Specimen: Nasal Mucosa; Nasopharyngeal  Result Value Ref Range Status   MRSA by PCR NEGATIVE NEGATIVE Final    Comment:        The GeneXpert MRSA Assay (FDA approved for NASAL specimens only), is one component of a comprehensive MRSA colonization surveillance program. It is not intended to diagnose MRSA infection nor to guide or monitor treatment for MRSA infections. Performed at [facility], [address] [place], [place] [address]   Culture, blood (routine x 2)     Status: None   Collection Time: 01/13/20 12:16 PM   Specimen: BLOOD  Result Value Ref Range Status   Specimen Description BLOOD LEFT ANTECUBITAL  Final   Special Requests   Final    BOTTLES DRAWN AEROBIC AND ANAEROBIC Blood Culture adequate volume   Culture   Final    NO GROWTH 6 DAYS Performed at [facility], [address], [place], [place] [address]    Report Status 01/19/2020 FINAL  Final  Culture, blood (routine x 2)     Status: None   Collection Time: 01/13/20 12:16 PM   Specimen: BLOOD  Result Value Ref Range Status   Specimen Description BLOOD LEFT HAND  Final   Special Requests   Final    BOTTLES DRAWN AEROBIC  AND ANAEROBIC Blood Culture adequate volume   Culture   Final    NO GROWTH 6 DAYS Performed at [facility], [address] [place], [place] [address]    Report Status 01/19/2020 FINAL  Final  Culture, blood (routine x 2)     Status: None   Collection  Time: 01/14/20  4:56 AM   Specimen: BLOOD LEFT HAND  Result Value Ref Range Status   Specimen Description BLOOD LEFT HAND  Final   Special Requests   Final    BOTTLES DRAWN AEROBIC AND ANAEROBIC Blood Culture adequate volume   Culture   Final    NO GROWTH 5 DAYS Performed at [facility], [address] [place], [place] [address]    Report Status 01/19/2020 FINAL  Final  Culture, blood (routine x 2)     Status: None   Collection Time: 01/14/20  5:05 AM   Specimen: BLOOD LEFT HAND  Result Value Ref Range Status   Specimen Description BLOOD LEFT HAND  Final   Special Requests   Final    BOTTLES DRAWN AEROBIC AND ANAEROBIC Blood Culture adequate volume   Culture   Final    NO GROWTH 5 DAYS Performed at [facility], [address] [place], [place] [address]    Report Status 01/19/2020 FINAL  Final    Radiology Reports CT ANGIO CHEST PE W OR WO CONTRAST  Result Date: 01/12/2020 CLINICAL DATA:  Bilateral lower extremity swelling, hypertension, vomiting and difficulty breathing since March, 2021. EXAM: CT ANGIOGRAPHY CHEST WITH CONTRAST TECHNIQUE: Multidetector CT imaging of the chest was performed using the standard protocol during bolus administration of intravenous contrast. Multiplanar CT image reconstructions and MIPs were obtained to evaluate the vascular anatomy. CONTRAST:  100 mL OMNIPAQUE IOHEXOL 350 MG/ML SOLN COMPARISON:  PA and lateral chest today and single-view of the chest 08/04/2019. FINDINGS: Cardiovascular: Satisfactory opacification of the pulmonary arteries to the segmental level. No evidence of pulmonary embolism. Normal heart size. There is cardiomegaly. No pericardial effusion. Mediastinum/Nodes: No enlarged mediastinal, hilar, or axillary lymph nodes. Thyroid gland, trachea, and esophagus demonstrate no significant findings. Lungs/Pleura: Trace pleural effusions. Mild dependent atelectasis is present. Upper Abdomen: There is marked, diffuse fatty infiltration of the  visualized liver. Mild stranding is seen about the pancreas. Musculoskeletal: No acute or focal abnormality. Review of the MIP images confirms the above findings. IMPRESSION: Negative for pulmonary embolus. Trace pleural effusions. Severe fatty infiltration of the liver. Mild stranding about the visualized pancreas may be due to pancreatitis. Cardiomegaly. Electronically Signed   By: Inge Rise M.D.   On: 01/12/2020 12:57   US Venous Img Lower Unilateral Left  Result Date: 01/11/2020 CLINICAL DATA:  Lower extremity pain and edema EXAM: LEFT LOWER EXTREMITY VENOUS DUPLEX ULTRASOUND TECHNIQUE: Gray-scale sonography with graded compression, as well as color Doppler and duplex ultrasound were performed to evaluate the left lower extremity deep venous system from the level of the common femoral vein and including the common femoral, femoral, profunda femoral, popliteal and calf veins including the posterior tibial, peroneal and gastrocnemius veins when visible. The superficial great saphenous vein was also interrogated. Spectral Doppler was utilized to evaluate flow at rest and with distal augmentation maneuvers in the common femoral, femoral and popliteal veins. COMPARISON:  None. FINDINGS: Contralateral Common Femoral Vein: Respiratory phasicity is normal and symmetric with the symptomatic side. No evidence of thrombus. Normal compressibility. Common Femoral Vein: No evidence of thrombus. Normal compressibility, respiratory phasicity and response to augmentation. Saphenofemoral Junction: No  evidence of thrombus. Normal compressibility and flow on color Doppler imaging. Profunda Femoral Vein: No evidence of thrombus. Normal compressibility and flow on color Doppler imaging. Femoral Vein: No evidence of thrombus. Normal compressibility, respiratory phasicity and response to augmentation. Popliteal Vein: No evidence of thrombus. Normal compressibility, respiratory phasicity and response to augmentation. Calf  Veins: No evidence of thrombus. Normal compressibility and flow on color Doppler imaging. Superficial Great Saphenous Vein: No evidence of thrombus. Normal compressibility. Venous Reflux:  None. Other Findings:  There is calf region edema. IMPRESSION: No evidence of deep venous thrombosis in the left lower extremity. Right common femoral vein patent. There is left lower extremity calf region edema. Electronically Signed   By: Lowella Grip III M.D.   On: 01/11/2020 15:19   DG CHEST PORT 1 VIEW  Result Date: 01/16/2020 CLINICAL DATA:  29 year old male with history of acute respiratory failure. EXAM: PORTABLE CHEST 1 VIEW COMPARISON:  Chest x-ray 01/15/2020. FINDINGS: An endotracheal tube is in place with tip 4.4 cm above the carina. There is a left-sided subclavian central venous catheter with tip terminating in the proximal superior vena cava. A nasogastric tube is seen extending into the stomach, however, the tip of the nasogastric tube extends below the lower margin of the image. Lung volumes are low. Bibasilar opacities may reflect areas of atelectasis and/or consolidation. Small bilateral pleural effusions (right greater than left). Heart size is mildly enlarged. Upper mediastinal contours are within normal limits. IMPRESSION: 1. Support apparatus, as above. 2. Persistent bibasilar areas of atelectasis and/or consolidation with superimposed small bilateral pleural effusions. Electronically Signed   By: Vinnie Langton M.D.   On: 01/16/2020 06:09   DG CHEST PORT 1 VIEW  Result Date: 01/15/2020 CLINICAL DATA:  Central line placement EXAM: PORTABLE CHEST 1 VIEW COMPARISON:  January 15, 2020 FINDINGS: A new left central line terminates in the region the SVC. No pneumothorax. The ETT is in good position. The NG tube terminates below today's film. A right-sided pleural effusion is stable. Bilateral pulmonary opacities, right greater than left are stable. Probable small layering effusion. The  cardiomediastinal silhouette is stable. No other acute abnormalities. IMPRESSION: 1. Small bilateral effusions, right greater than left. 2. Support apparatus as above. No pneumothorax after left central line placement. 3. Right greater than left pulmonary opacities remain, mildly improved in the interval. Recommend attention on follow-up. Electronically Signed   By: Dorise Bullion III M.D   On: 01/15/2020 10:48   DG CHEST PORT 1 VIEW  Result Date: 01/15/2020 CLINICAL DATA:  29 year old male with history of acute respiratory failure. EXAM: PORTABLE CHEST 1 VIEW COMPARISON:  Chest x-ray 01/14/2020. FINDINGS: An endotracheal tube is in place with tip 3.8 cm above the carina. A nasogastric tube is seen extending into the stomach, however, the tip of the nasogastric tube extends below the lower margin of the image. Lung volumes are low. Moderate right and small left pleural effusions. Bibasilar opacities (right greater than left), with definite air bronchograms on the right. No pneumothorax. Heart size is mildly enlarged. Upper mediastinal contours are within normal limits allowing for patient positioning. IMPRESSION: 1. Support apparatus, as above. 2. Airspace consolidation in the right lower lobe concerning for pneumonia. Additional left basilar atelectasis and/or consolidation. 3. Enlarging moderate right and small left pleural effusions. Electronically Signed   By: Vinnie Langton M.D.   On: 01/15/2020 06:33   DG CHEST PORT 1 VIEW  Result Date: 01/14/2020 CLINICAL DATA:  Intubation EXAM: PORTABLE CHEST 1 VIEW COMPARISON:  Earlier same day FINDINGS: Endotracheal tube tip is 6 cm above the carina. Orogastric or nasogastric tube enters the stomach. Cardiomegaly, bilateral effusions and pulmonary edema appear similar to the previous study. IMPRESSION: Lines and tubes well positioned. Heart failure pattern as seen previously. Electronically Signed   By: Nelson Chimes M.D.   On: 01/14/2020 21:48   Portable Chest  xray  Result Date: 01/14/2020 CLINICAL DATA:  Hypoxia EXAM: PORTABLE CHEST 1 VIEW COMPARISON:  January 13, 2020. FINDINGS: Endotracheal tube tip is 5.4 cm above the carina. Nasogastric tube tip and side port are below the diaphragm. No pneumothorax. There are pleural effusions bilaterally which appear to be partially layering. There is consolidation in both lung bases. No new opacity evident. There is cardiomegaly with a degree of pulmonary venous hypertension. No adenopathy evident. No bone lesions. IMPRESSION: Tube positions as described without pneumothorax. Cardiomegaly with pulmonary vascular congestion and pleural effusion. Suspect a degree of congestive heart failure. Airspace consolidation is noted in both lung bases, primarily medially. Suspect combination of atelectasis and pneumonia in these areas. A degree of alveolar edema may also be present in these areas. The appearance of the lungs is similar to 1 day prior. Electronically Signed   By: Lowella Grip III M.D.   On: 01/14/2020 08:07   DG CHEST PORT 1 VIEW  Result Date: 01/13/2020 CLINICAL DATA:  Post intubation EXAM: PORTABLE CHEST 1 VIEW COMPARISON:  01/11/2020, CT 01/12/2020 FINDINGS: Cardiomegaly with vascular congestion and hazy perihilar opacities suspicious for mild pulmonary edema. At least small bilateral effusions. Endotracheal tube tip is about 4.3 cm superior to carina. Esophageal tube tip is below the diaphragm but is incompletely visualized. IMPRESSION: 1. Cardiomegaly with vascular congestion and hazy perihilar opacities suspicious for mild pulmonary edema. At least small bilateral effusions. 2. Endotracheal tube tip about 4.3 cm superior to carina. Electronically Signed   By: Donavan Foil M.D.   On: 01/13/2020 20:17   DG Chest Port 1 View  Result Date: 01/11/2020 CLINICAL DATA:  29 year old male with history of tachycardia and leg swelling. EXAM: PORTABLE CHEST 1 VIEW COMPARISON:  Chest x-ray 08/04/2019. FINDINGS: Lung  volumes are normal. No consolidative airspace disease. No pleural effusions. No pneumothorax. No pulmonary nodule or mass noted. Pulmonary vasculature and the cardiomediastinal silhouette are within normal limits. IMPRESSION: No radiographic evidence of acute cardiopulmonary disease. Electronically Signed   By: Vinnie Langton M.D.   On: 01/11/2020 19:58   ECHOCARDIOGRAM COMPLETE  Result Date: 01/13/2020    ECHOCARDIOGRAM REPORT   Patient Name:   DUSTYN DANSEREAU Bellucci Date of Exam: 01/13/2020 Medical Rec #:  [id number]        Height:       67.0 in Accession #:    [id number]       Weight:       277.3 lb Date of Birth:  February 08, 1991        BSA:          2.324 m Patient Age:    28 years         BP:           141/117 mmHg Patient Gender: M                HR:           95 bpm. Exam Location:  [facility] Procedure: Intracardiac Opacification Agent Indications:    Leg swelling [[id number]  History:        Patient has no prior history of Echocardiogram  examinations.                 Risk Factors:Current Smoker. ETOH, Leg Swelling.  Sonographer:    Leavy Cella RDCS (AE) Referring Phys: [place]  1. Left ventricular ejection fraction, by estimation, is approximately 40%. The left ventricle has moderately decreased function. The left ventricle demonstrates global hypokinesis with some regionla variation. There is moderate left ventricular hypertrophy. Left ventricular diastolic parameters are indeterminate in the setting of atrial fibrillation. No obvious LV mural thrombus.  2. Right ventricular systolic function is moderately reduced. The right ventricular size is normal. There is normal pulmonary artery systolic pressure. The estimated right ventricular systolic pressure is 83.3 mmHg.  3. The mitral valve is grossly normal. Mild mitral valve regurgitation.  4. The aortic valve is tricuspid. Aortic valve regurgitation is not visualized.  5. The inferior vena cava is dilated in size with <50%  respiratory variability, suggesting right atrial pressure of 15 mmHg. FINDINGS  Left Ventricle: Left ventricular ejection fraction, by estimation, is 40%. The left ventricle has moderately decreased function. The left ventricle demonstrates global hypokinesis. Definity contrast agent was given IV to delineate the left ventricular endocardial borders. The left ventricular internal cavity size was normal in size. There is moderate left ventricular hypertrophy. Left ventricular diastolic parameters are indeterminate. Right Ventricle: The right ventricular size is normal. No increase in right ventricular wall thickness. Right ventricular systolic function is moderately reduced. There is normal pulmonary artery systolic pressure. The tricuspid regurgitant velocity is 1.86 m/s, and with an assumed right atrial pressure of 15 mmHg, the estimated right ventricular systolic pressure is 82.5 mmHg. Left Atrium: Left atrial size was normal in size. Right Atrium: Right atrial size was normal in size. Pericardium: There is no evidence of pericardial effusion. Mitral Valve: The mitral valve is grossly normal. Mild mitral valve regurgitation. Tricuspid Valve: The tricuspid valve is grossly normal. Tricuspid valve regurgitation is trivial. Aortic Valve: The aortic valve is tricuspid. Aortic valve regurgitation is not visualized. Pulmonic Valve: The pulmonic valve was grossly normal. Pulmonic valve regurgitation is trivial. Aorta: The aortic root is normal in size and structure. Venous: The inferior vena cava is dilated in size with less than 50% respiratory variability, suggesting right atrial pressure of 15 mmHg. IAS/Shunts: No atrial level shunt detected by color flow Doppler.  LEFT VENTRICLE PLAX 2D LVIDd:         3.70 cm  Diastology LVIDs:         2.93 cm  LV e' lateral:   12.90 cm/s LV PW:         1.53 cm  LV E/e' lateral: 8.9 LV IVS:        1.43 cm  LV e' medial:    8.49 cm/s LVOT diam:     2.10 cm  LV E/e' medial:  13.5 LVOT  Area:     3.46 cm  RIGHT VENTRICLE RV S prime:     9.57 cm/s TAPSE (M-mode): 1.1 cm LEFT ATRIUM           Index       RIGHT ATRIUM           Index LA diam:      4.70 cm 2.02 cm/m  RA Area:     22.10 cm LA Vol (A2C): 52.7 ml 22.68 ml/m RA Volume:   72.10 ml  31.03 ml/m LA Vol (A4C): 49.8 ml 21.43 ml/m   AORTA Ao Root diam: 2.70 cm MITRAL VALVE  TRICUSPID VALVE MV Area (PHT): 7.51 cm     TR Peak grad:   13.8 mmHg MV Decel Time: 101 msec     TR Vmax:        186.00 cm/s MV E velocity: 115.00 cm/s MV A velocity: 37.20 cm/s   SHUNTS MV E/A ratio:  3.09         Systemic Diam: 2.10 cm Rozann Lesches MD Electronically signed by Rozann Lesches MD Signature Date/Time: 01/13/2020/12:47:33 PM    Final    [place] EKG SITE RITE  Result Date: 01/15/2020 If Site Rite image not attached, placement could not be confirmed due to current cardiac rhythm.  US Abdomen Limited RUQ  Result Date: 01/12/2020 CLINICAL DATA:  Elevated liver enzymes EXAM: ULTRASOUND ABDOMEN LIMITED RIGHT UPPER QUADRANT COMPARISON:  None. FINDINGS: Gallbladder: No gallstones or wall thickening visualized. There is no pericholecystic fluid. No sonographic Murphy sign noted by sonographer. Common bile duct: Diameter: 4 mm. No intrahepatic or extrahepatic biliary duct dilatation. Liver: No focal lesion identified. Liver echogenicity is increased diffusely. Portal vein is patent on color Doppler imaging with normal direction of blood flow towards the liver. Other: None. IMPRESSION: Diffuse increase in liver echogenicity, a finding indicative of hepatic steatosis, potentially with underlying parenchymal liver disease as well. No focal liver lesions are evident; it must be cautioned that the sensitivity of ultrasound for detection of focal liver lesions is diminished in this circumstance. Study otherwise unremarkable. Electronically Signed   By: Lowella Grip III M.D.   On: 01/12/2020 10:12     CBC Recent Labs  Lab 01/17/20 0857  01/18/20 0555 01/19/20 0416 01/20/20 0847 01/21/20 0457  WBC 5.5 5.3 5.7 8.5 7.0  HGB 11.3* 11.2* 12.8* 13.8 13.1  HCT 33.6* 34.7* 40.0 45.1 39.7  PLT 100* 135* 183 259 277  MCV 105.7* 108.1* 108.1* 113.0* 105.6*  MCH 35.5* 34.9* 34.6* 34.6* 34.8*  MCHC 33.6 32.3 32.0 30.6 33.0  RDW 15.7* 15.7* 15.8* 15.5 14.9    Chemistries  Recent Labs  Lab 01/14/20 1701 01/15/20 0421 01/15/20 0421 01/15/20 1710 01/16/20 0524 01/17/20 0857 01/18/20 0555 01/19/20 0416 01/21/20 0457  NA  --  136   < >  --  134* 134* 134* 136 133*  K  --  3.5   < >  --  3.0* 3.2* 3.7 3.2* 4.1  CL  --  106   < >  --  103 101 101 103 104  CO2  --  21*   < >  --  _0 19*  GLUCOSE  --  84   < >  --  115* 103* 105* 98 94  BUN  --  13   < >  --  10 5* <5* <5* 6  CREATININE  --  0.55*   < >  --  0.48* 0.43* 0.37* 0.53* 0.57*  CALCIUM  --  7.7*   < >  --  7.8* 8.0* 8.2* 8.5* 8.5*  MG 1.6* 1.7  --  1.7 1.8  --   --  1.9  --   AST  --  152*  --   --  152* 111*  --  57*  --   ALT  --  62*  --   --  71* 63*  --  43  --   ALKPHOS  --  112  --   --  133* 127*  --  117  --   BILITOT  --  3.0*  --   --  2.9* 3.0*  --  1.8*  --    < > = values in this interval not displayed.   ------------------------------------------------------------------------------------------------------------------ No results for input(s): CHOL, HDL, LDLCALC, TRIG, CHOLHDL, LDLDIRECT in the last 72 hours.  No results found for: HGBA1C ------------------------------------------------------------------------------------------------------------------ No results for input(s): TSH, T4TOTAL, T3FREE, THYROIDAB in the last 72 hours.  Invalid input(s): FREET3 ------------------------------------------------------------------------------------------------------------------ No results for input(s): VITAMINB12, FOLATE, FERRITIN, TIBC, IRON, RETICCTPCT in the last 72 hours.  Coagulation profile No results for input(s): INR, PROTIME in the last  168 hours.  No results for input(s): DDIMER in the last 72 hours.  Cardiac Enzymes No results for input(s): CKMB, TROPONINI, MYOGLOBIN in the last 168 hours.  Invalid input(s): CK ------------------------------------------------------------------------------------------------------------------    Component Value Date/Time   BNP 279.0 (H) 01/11/2020 1914   Roxan Hockey M.D on 01/21/2020 at 12:51 PM  Go to [email or web address] - for contact info  [facility] - Office  [phone]

## 2020-01-21 NOTE — Progress Notes (Signed)
Pt expressed that he did not sleep well because he was thinking about possible cardioversion today. He expressed that he would rather try to control his rate with meds than to be cardioverted today. Advised patient that I would pass his concerns on to the oncoming shift.

## 2020-01-22 MED ORDER — DIGOXIN 125 MCG PO TABS
0.1250 mg | ORAL_TABLET | Freq: Every day | ORAL | 1 refills | Status: DC
Start: 2020-01-23 — End: 2020-02-17

## 2020-01-22 MED ORDER — METOPROLOL SUCCINATE ER 50 MG PO TB24
100.0000 mg | ORAL_TABLET | Freq: Every day | ORAL | Status: DC
  Administered 2020-01-22: 100 mg via ORAL
  Filled 2020-01-22: qty 2

## 2020-01-22 MED ORDER — NICOTINE 14 MG/24HR TD PT24: 14.0000 mg | MEDICATED_PATCH | TRANSDERMAL | 0 refills | Status: AC

## 2020-01-22 MED ORDER — ACETAMINOPHEN 325 MG PO TABS
650.0000 mg | ORAL_TABLET | Freq: Four times a day (QID) | ORAL | 0 refills | Status: DC | PRN
Start: 2020-01-22 — End: 2020-04-04

## 2020-01-22 MED ORDER — CEPHALEXIN 500 MG PO CAPS
500.0000 mg | ORAL_CAPSULE | Freq: Three times a day (TID) | ORAL | 0 refills | Status: DC
Start: 2020-01-22 — End: 2020-01-27

## 2020-01-22 MED ORDER — DILTIAZEM HCL ER COATED BEADS 360 MG PO CP24: 360.0000 mg | ORAL_CAPSULE | Freq: Every day | ORAL | 2 refills | Status: DC

## 2020-01-22 MED ORDER — ADULT MULTIVITAMIN W/MINERALS CH: 1.0000 | ORAL_TABLET | Freq: Every day | ORAL | 2 refills | Status: DC

## 2020-01-22 MED ORDER — THIAMINE HCL 100 MG PO TABS
100.0000 mg | ORAL_TABLET | Freq: Every day | ORAL | 1 refills | Status: DC
Start: 2020-01-23 — End: 2021-04-14

## 2020-01-22 MED ORDER — APIXABAN 5 MG PO TABS
5.0000 mg | ORAL_TABLET | Freq: Two times a day (BID) | ORAL | 3 refills | Status: DC
Start: 2020-01-22 — End: 2020-02-17

## 2020-01-22 MED ORDER — DILTIAZEM HCL ER COATED BEADS 240 MG PO CP24
240.0000 mg | ORAL_CAPSULE | Freq: Every day | ORAL | Status: DC
  Administered 2020-01-22: 240 mg via ORAL
  Filled 2020-01-22: qty 1

## 2020-01-22 MED ORDER — METOPROLOL SUCCINATE ER 100 MG PO TB24
100.0000 mg | ORAL_TABLET | Freq: Every day | ORAL | 3 refills | Status: DC
Start: 2020-01-23 — End: 2020-02-17

## 2020-01-22 MED ORDER — FOLIC ACID 1 MG PO TABS
1.0000 mg | ORAL_TABLET | Freq: Every day | ORAL | 2 refills | Status: DC
Start: 2020-01-23 — End: 2020-04-04

## 2020-01-22 MED ORDER — PANTOPRAZOLE SODIUM 40 MG PO TBEC
40.0000 mg | DELAYED_RELEASE_TABLET | Freq: Every day | ORAL | 3 refills | Status: DC
Start: 2020-01-23 — End: 2020-06-09

## 2020-01-22 NOTE — Discharge Summary (Signed)
Omar Mccarthy, is a 29 y.o. male  DOB April 16, 1991  MRN [id number].  Admission date:  01/11/2020  Admitting Physician  Bernadette Hoit, DO  Discharge Date:  01/22/2020   Primary MD  Health, [facility]  Recommendations for primary care physician for things to follow:   1) you were given a free coupon for apixaban/Eliquis--- so you should be able to get your first month supply free at the pharmacy 2) you are taking apixaban/Eliquis which is your blood thinner so please Avoid ibuprofen/Advil/Aleve/Motrin/Goody Powders/Naproxen/BC powders/Meloxicam/Diclofenac/Indomethacin and other Nonsteroidal anti-inflammatory medications as these will make you more likely to bleed and can cause stomach ulcers, can also cause Kidney problems.   3)Complete abstinence from alcohol strongly advised--- alcohol lowers your platelet count which would put you at risk for bleeding while taking Eliquis/Apixaban  4)Outpatient follow-up with cardiology advised--- you may need cardioversion as outpatient if your heart rate remains stubbornly high despite medications  Admission Diagnosis  Peripheral edema [R60.9] Tachycardia [R00.0] Elevated liver enzymes [R74.8] Fluid overload [E87.70] Alcohol use disorder, severe, dependence (HCC) [F10.20] Hypervolemia, unspecified hypervolemia type [E87.70]   Discharge Diagnosis  Peripheral edema [R60.9] Tachycardia [R00.0] Elevated liver enzymes [R74.8] Fluid overload [E87.70] Alcohol use disorder, severe, dependence ([place]) [F10.20] Hypervolemia, unspecified hypervolemia type [E87.70]    Principal Problem:   New onset a-fib (HCC) Active Problems:   Alcohol abuse with intoxication ([place])   Respiratory failure requiring intubation (HCC)   Fluid overload   Leg swelling   Lack of intravenous access      Past Medical History:  Diagnosis Date  . Hypertension    History  reviewed. No pertinent surgical history.   HPI  from the history and physical done on the day of admission:    Chief Complaint: Difficulty breathing, leg swelling  HPI: Omar Mccarthy is a 29 y.o. male with medical history significant for hypertension.  Patient presented to the ED with complaints of bilateral lower extremity swelling for the past several months.  He also reports intermittent vomiting, difficulty breathing also intermittent. Patient was in the ED in March, after he left the ED, his swelling improved at that time, but then reoccurred again.  He reports abdominal bloating, he also reports weight gain, his baseline weight is about 240, but his weight was checked today at the health department, it was 273. He reports difficulty breathing, intermittent, stable and unchanged 2-3 pillow orthopnea.  He reports chest pain ongoing over several years, intermittent, not related to activity, described both as sharp, pressure-like and burning, and usually related to his alcohol withdrawal episodes.  No family history of heart disease in parents or siblings. Patient drinks 24-26 beers daily or drinks half a gallon of vodka daily.  His last drink was at about 4 PM, just prior to arrival in the ED, 6 hours ago. Patient reports about 2 weeks ago, he had several episodes of black stools/abnormal colored stools, this lasted for about 2 months.  No vomiting.  Reports occasionally seeing blood when he wipes after  a bowel movement.  He has not received the Covid vaccine.  ED Course: Tachycardic up to 150s.  BNP elevated 279.  Blood alcohol level elevated at 364.  Ammonia 62.  Lipase 112.  Troponin X 24.  Hemoglobin 11.7 down from 14.65 months ago..  Left lower extremity venous Dopplers negative for DVT.  Portable chest x-ray without acute abnormality. 1 L bolus given in ED with improvement in patient's heart rate.  Hospitalist to admit for alcohol intoxication and volume overload.   Hospital Course:       Brief Summary:- 28 y.o.malewith medical history significant forEtoh abuse and HTN admitted on 01/11/2020 with acute anemia, Thrombocytopenia, elevated LFTs in the setting of acute alcohol intoxication and found to have new onset atrial fibrillation -Rate control improving  A/p 1)New onset atrial fibrillation--- with RVR --rate control was challenging diagnosis is new this admission, EF 40% CHA2DS2-VASc score 2 -Thrombocytopenia is resolved, -Digoxin load on 01/19/2020 -Off amiodarone drip as of 01/19/2020 -Patient declines TEE and cardioversion as inpatient on 01/21/2020  -- patient may have cardioversion as outpatient if able to demonstrate compliance with anticoagulation -Cardizem has been changed to Cardizem CD 360 mg , -- continue digoxin 0.125 mg daily, -D/c Metoprolol  XL 100 mg   daily ---Continue Eliquis (started this admission)  2)HFrEF--patient presenting with acute systolic dysfunction CHF, echo with EF of 40%, with moderate RV dysfunction, suspect due to combination of alcohol abuse and tachycardia mediated cardiomyopathy in the setting of A. fib with RVR avoid ACEI/ARB/ARNI due to soft BP with  need for AV nodal blocking agents  3)DTs--alcohol withdrawal symptoms have now resolved, continue multivitamin --abstinence from alcohol strongly advised  4) acute hypoxic respiratory failure in the setting of DTs requiring intubation--patient self extubated on 01/16/2020 -Patient is currently on room air - 5)Sepsis----patient met sepsis criteria during his hospitalization---) due to left lower extremity cellulitis, blood cultures with 1 out of 2 with staph hominis--- ID physician Dr. Graylon Good recommends IV Ancef while inpatient and then discharged on p.o. Keflex to complete 2 weeks of antibiotics--Rx for Keflex for additional 5 days given  6)Thrombocytopenia--- suspect due to direct toxic effects of alcohol on the bone marrow, resolved with cessation of alcohol -Platelets are up  >> 250 k from a low of 62 k this admission  7)Alcoholic hepatitis with elevated LFTs----AST is down from a high of 209 to 57,  ALT is down from a high of 94 to 43 -AST to ALT ratio has been mostly more than 2 :1 consistent with alcoholic hepatitis -Abdominal ultrasound without acute findings and acute viral hepatitis profile negative  Disposition/--discharge home with family   Code Status : Full code  Family Communication:   (patient is alert, awake and coherent)   Consultants:  PCCM  Gen Surg for central line placement  Cardiology  Procedures:  Echo: EF 40% with global hypokinesis  ETT 8/19>8/22  CVC left subclavian 8/21>  Antimicrobials:  IV Ancef since 8/18  Discharge Condition: Stable  Follow UP   Follow-up Information    Satira Sark, MD. Schedule an appointment as soon as possible for a visit in 1 week(s).   Specialty: Cardiology Why: For recheck and possible cardioversion Contact information: [place] [address] [phone]               Diet and Activity recommendation:  As advised  Discharge Instructions    Discharge Instructions    Call MD for:  difficulty breathing, headache or visual  disturbances   Complete by: As directed    Call MD for:  persistant dizziness or light-headedness   Complete by: As directed    Call MD for:  persistant nausea and vomiting   Complete by: As directed    Call MD for:  temperature >100.4   Complete by: As directed    Diet - low sodium heart healthy   Complete by: As directed    Discharge instructions   Complete by: As directed    1) you were given a free coupon for apixaban/Eliquis--- so you should be able to get your first month supply free at the pharmacy 2) you are taking apixaban/Eliquis which is your blood thinner so please Avoid ibuprofen/Advil/Aleve/Motrin/Goody Powders/Naproxen/BC powders/Meloxicam/Diclofenac/Indomethacin and other Nonsteroidal anti-inflammatory  medications as these will make you more likely to bleed and can cause stomach ulcers, can also cause Kidney problems.   3)Complete abstinence from alcohol strongly advised--- alcohol lowers your platelet count which would put you at risk for bleeding while taking Eliquis/Apixaban  4)Outpatient follow-up with cardiology advised--- you may need cardioversion as outpatient if your heart rate remains stubbornly high despite medications   Increase activity slowly   Complete by: As directed         Discharge Medications     Allergies as of 01/22/2020   No Known Allergies     Medication List    TAKE these medications   acetaminophen 325 MG tablet Commonly known as: TYLENOL Take 2 tablets (650 mg total) by mouth every 6 (six) hours as needed for mild pain, fever or headache.   apixaban 5 MG Tabs tablet Commonly known as: ELIQUIS Take 1 tablet (5 mg total) by mouth 2 (two) times daily.   cephALEXin 500 MG capsule Commonly known as: Keflex Take 1 capsule (500 mg total) by mouth 3 (three) times daily for 5 days.   digoxin 0.125 MG tablet Commonly known as: LANOXIN Take 1 tablet (0.125 mg total) by mouth daily. Start taking on: January 23, 2020   diltiazem 360 MG 24 hr capsule Commonly known as: CARDIZEM CD Take 1 capsule (360 mg total) by mouth daily. Start taking on: January 23, 5283   folic acid 1 MG tablet Commonly known as: FOLVITE Take 1 tablet (1 mg total) by mouth daily. Start taking on: January 23, 2020   metoprolol succinate 100 MG 24 hr tablet Commonly known as: TOPROL-XL Take 1 tablet (100 mg total) by mouth daily. Take with or immediately following a meal. Start taking on: January 23, 2020   multivitamin with minerals Tabs tablet Take 1 tablet by mouth daily. Start taking on: January 23, 2020   nicotine 14 mg/24hr patch Commonly known as: NICODERM CQ - dosed in mg/24 hours Place 1 patch (14 mg total) onto the skin daily for 28 days.   pantoprazole 40 MG  tablet Commonly known as: PROTONIX Take 1 tablet (40 mg total) by mouth daily. Start taking on: January 23, 2020   thiamine 100 MG tablet Take 1 tablet (100 mg total) by mouth daily. Start taking on: January 23, 2020      Major procedures and Radiology Reports - PLEASE review detailed and final reports for all details, in brief -  CT ANGIO CHEST PE W OR WO CONTRAST  Result Date: 01/12/2020 CLINICAL DATA:  Bilateral lower extremity swelling, hypertension, vomiting and difficulty breathing since March, 2021. EXAM: CT ANGIOGRAPHY CHEST WITH CONTRAST TECHNIQUE: Multidetector CT imaging of the chest was performed using the standard protocol during bolus  administration of intravenous contrast. Multiplanar CT image reconstructions and MIPs were obtained to evaluate the vascular anatomy. CONTRAST:  100 mL OMNIPAQUE IOHEXOL 350 MG/ML SOLN COMPARISON:  PA and lateral chest today and single-view of the chest 08/04/2019. FINDINGS: Cardiovascular: Satisfactory opacification of the pulmonary arteries to the segmental level. No evidence of pulmonary embolism. Normal heart size. There is cardiomegaly. No pericardial effusion. Mediastinum/Nodes: No enlarged mediastinal, hilar, or axillary lymph nodes. Thyroid gland, trachea, and esophagus demonstrate no significant findings. Lungs/Pleura: Trace pleural effusions. Mild dependent atelectasis is present. Upper Abdomen: There is marked, diffuse fatty infiltration of the visualized liver. Mild stranding is seen about the pancreas. Musculoskeletal: No acute or focal abnormality. Review of the MIP images confirms the above findings. IMPRESSION: Negative for pulmonary embolus. Trace pleural effusions. Severe fatty infiltration of the liver. Mild stranding about the visualized pancreas may be due to pancreatitis. Cardiomegaly. Electronically Signed   By: Inge Rise M.D.   On: 01/12/2020 12:57   US Venous Img Lower Unilateral Left  Result Date: 01/11/2020 CLINICAL DATA:   Lower extremity pain and edema EXAM: LEFT LOWER EXTREMITY VENOUS DUPLEX ULTRASOUND TECHNIQUE: Gray-scale sonography with graded compression, as well as color Doppler and duplex ultrasound were performed to evaluate the left lower extremity deep venous system from the level of the common femoral vein and including the common femoral, femoral, profunda femoral, popliteal and calf veins including the posterior tibial, peroneal and gastrocnemius veins when visible. The superficial great saphenous vein was also interrogated. Spectral Doppler was utilized to evaluate flow at rest and with distal augmentation maneuvers in the common femoral, femoral and popliteal veins. COMPARISON:  None. FINDINGS: Contralateral Common Femoral Vein: Respiratory phasicity is normal and symmetric with the symptomatic side. No evidence of thrombus. Normal compressibility. Common Femoral Vein: No evidence of thrombus. Normal compressibility, respiratory phasicity and response to augmentation. Saphenofemoral Junction: No evidence of thrombus. Normal compressibility and flow on color Doppler imaging. Profunda Femoral Vein: No evidence of thrombus. Normal compressibility and flow on color Doppler imaging. Femoral Vein: No evidence of thrombus. Normal compressibility, respiratory phasicity and response to augmentation. Popliteal Vein: No evidence of thrombus. Normal compressibility, respiratory phasicity and response to augmentation. Calf Veins: No evidence of thrombus. Normal compressibility and flow on color Doppler imaging. Superficial Great Saphenous Vein: No evidence of thrombus. Normal compressibility. Venous Reflux:  None. Other Findings:  There is calf region edema. IMPRESSION: No evidence of deep venous thrombosis in the left lower extremity. Right common femoral vein patent. There is left lower extremity calf region edema. Electronically Signed   By: Lowella Grip III M.D.   On: 01/11/2020 15:19   DG CHEST PORT 1 VIEW  Result  Date: 01/16/2020 CLINICAL DATA:  29 year old male with history of acute respiratory failure. EXAM: PORTABLE CHEST 1 VIEW COMPARISON:  Chest x-ray 01/15/2020. FINDINGS: An endotracheal tube is in place with tip 4.4 cm above the carina. There is a left-sided subclavian central venous catheter with tip terminating in the proximal superior vena cava. A nasogastric tube is seen extending into the stomach, however, the tip of the nasogastric tube extends below the lower margin of the image. Lung volumes are low. Bibasilar opacities may reflect areas of atelectasis and/or consolidation. Small bilateral pleural effusions (right greater than left). Heart size is mildly enlarged. Upper mediastinal contours are within normal limits. IMPRESSION: 1. Support apparatus, as above. 2. Persistent bibasilar areas of atelectasis and/or consolidation with superimposed small bilateral pleural effusions. Electronically Signed   By: Vinnie Langton  M.D.   On: 01/16/2020 06:09   DG CHEST PORT 1 VIEW  Result Date: 01/15/2020 CLINICAL DATA:  Central line placement EXAM: PORTABLE CHEST 1 VIEW COMPARISON:  January 15, 2020 FINDINGS: A new left central line terminates in the region the SVC. No pneumothorax. The ETT is in good position. The NG tube terminates below today's film. A right-sided pleural effusion is stable. Bilateral pulmonary opacities, right greater than left are stable. Probable small layering effusion. The cardiomediastinal silhouette is stable. No other acute abnormalities. IMPRESSION: 1. Small bilateral effusions, right greater than left. 2. Support apparatus as above. No pneumothorax after left central line placement. 3. Right greater than left pulmonary opacities remain, mildly improved in the interval. Recommend attention on follow-up. Electronically Signed   By: Dorise Bullion III M.D   On: 01/15/2020 10:48   DG CHEST PORT 1 VIEW  Result Date: 01/15/2020 CLINICAL DATA:  29 year old male with history of acute  respiratory failure. EXAM: PORTABLE CHEST 1 VIEW COMPARISON:  Chest x-ray 01/14/2020. FINDINGS: An endotracheal tube is in place with tip 3.8 cm above the carina. A nasogastric tube is seen extending into the stomach, however, the tip of the nasogastric tube extends below the lower margin of the image. Lung volumes are low. Moderate right and small left pleural effusions. Bibasilar opacities (right greater than left), with definite air bronchograms on the right. No pneumothorax. Heart size is mildly enlarged. Upper mediastinal contours are within normal limits allowing for patient positioning. IMPRESSION: 1. Support apparatus, as above. 2. Airspace consolidation in the right lower lobe concerning for pneumonia. Additional left basilar atelectasis and/or consolidation. 3. Enlarging moderate right and small left pleural effusions. Electronically Signed   By: Vinnie Langton M.D.   On: 01/15/2020 06:33   DG CHEST PORT 1 VIEW  Result Date: 01/14/2020 CLINICAL DATA:  Intubation EXAM: PORTABLE CHEST 1 VIEW COMPARISON:  Earlier same day FINDINGS: Endotracheal tube tip is 6 cm above the carina. Orogastric or nasogastric tube enters the stomach. Cardiomegaly, bilateral effusions and pulmonary edema appear similar to the previous study. IMPRESSION: Lines and tubes well positioned. Heart failure pattern as seen previously. Electronically Signed   By: Nelson Chimes M.D.   On: 01/14/2020 21:48   Portable Chest xray  Result Date: 01/14/2020 CLINICAL DATA:  Hypoxia EXAM: PORTABLE CHEST 1 VIEW COMPARISON:  January 13, 2020. FINDINGS: Endotracheal tube tip is 5.4 cm above the carina. Nasogastric tube tip and side port are below the diaphragm. No pneumothorax. There are pleural effusions bilaterally which appear to be partially layering. There is consolidation in both lung bases. No new opacity evident. There is cardiomegaly with a degree of pulmonary venous hypertension. No adenopathy evident. No bone lesions. IMPRESSION:  Tube positions as described without pneumothorax. Cardiomegaly with pulmonary vascular congestion and pleural effusion. Suspect a degree of congestive heart failure. Airspace consolidation is noted in both lung bases, primarily medially. Suspect combination of atelectasis and pneumonia in these areas. A degree of alveolar edema may also be present in these areas. The appearance of the lungs is similar to 1 day prior. Electronically Signed   By: Lowella Grip III M.D.   On: 01/14/2020 08:07   DG CHEST PORT 1 VIEW  Result Date: 01/13/2020 CLINICAL DATA:  Post intubation EXAM: PORTABLE CHEST 1 VIEW COMPARISON:  01/11/2020, CT 01/12/2020 FINDINGS: Cardiomegaly with vascular congestion and hazy perihilar opacities suspicious for mild pulmonary edema. At least small bilateral effusions. Endotracheal tube tip is about 4.3 cm superior to carina. Esophageal tube  tip is below the diaphragm but is incompletely visualized. IMPRESSION: 1. Cardiomegaly with vascular congestion and hazy perihilar opacities suspicious for mild pulmonary edema. At least small bilateral effusions. 2. Endotracheal tube tip about 4.3 cm superior to carina. Electronically Signed   By: Donavan Foil M.D.   On: 01/13/2020 20:17   DG Chest Port 1 View  Result Date: 01/11/2020 CLINICAL DATA:  29 year old male with history of tachycardia and leg swelling. EXAM: PORTABLE CHEST 1 VIEW COMPARISON:  Chest x-ray 08/04/2019. FINDINGS: Lung volumes are normal. No consolidative airspace disease. No pleural effusions. No pneumothorax. No pulmonary nodule or mass noted. Pulmonary vasculature and the cardiomediastinal silhouette are within normal limits. IMPRESSION: No radiographic evidence of acute cardiopulmonary disease. Electronically Signed   By: Vinnie Langton M.D.   On: 01/11/2020 19:58   ECHOCARDIOGRAM COMPLETE  Result Date: 01/13/2020    ECHOCARDIOGRAM REPORT   Patient Name:   AKILI CORSETTI Wence Date of Exam: 01/13/2020 Medical Rec #:  [id number]         Height:       67.0 in Accession #:    [id number]       Weight:       277.3 lb Date of Birth:  05/19/1991        BSA:          2.324 m Patient Age:    28 years         BP:           141/117 mmHg Patient Gender: M                HR:           95 bpm. Exam Location:  [facility] Procedure: Intracardiac Opacification Agent Indications:    Leg swelling [[id number]  History:        Patient has no prior history of Echocardiogram examinations.                 Risk Factors:Current Smoker. ETOH, Leg Swelling.  Sonographer:    Leavy Cella RDCS (AE) Referring Phys: [place]  1. Left ventricular ejection fraction, by estimation, is approximately 40%. The left ventricle has moderately decreased function. The left ventricle demonstrates global hypokinesis with some regionla variation. There is moderate left ventricular hypertrophy. Left ventricular diastolic parameters are indeterminate in the setting of atrial fibrillation. No obvious LV mural thrombus.  2. Right ventricular systolic function is moderately reduced. The right ventricular size is normal. There is normal pulmonary artery systolic pressure. The estimated right ventricular systolic pressure is 61.4 mmHg.  3. The mitral valve is grossly normal. Mild mitral valve regurgitation.  4. The aortic valve is tricuspid. Aortic valve regurgitation is not visualized.  5. The inferior vena cava is dilated in size with <50% respiratory variability, suggesting right atrial pressure of 15 mmHg. FINDINGS  Left Ventricle: Left ventricular ejection fraction, by estimation, is 40%. The left ventricle has moderately decreased function. The left ventricle demonstrates global hypokinesis. Definity contrast agent was given IV to delineate the left ventricular endocardial borders. The left ventricular internal cavity size was normal in size. There is moderate left ventricular hypertrophy. Left ventricular diastolic parameters are indeterminate. Right  Ventricle: The right ventricular size is normal. No increase in right ventricular wall thickness. Right ventricular systolic function is moderately reduced. There is normal pulmonary artery systolic pressure. The tricuspid regurgitant velocity is 1.86 m/s, and with an assumed right atrial pressure of 15 mmHg, the estimated right ventricular  systolic pressure is 17.4 mmHg. Left Atrium: Left atrial size was normal in size. Right Atrium: Right atrial size was normal in size. Pericardium: There is no evidence of pericardial effusion. Mitral Valve: The mitral valve is grossly normal. Mild mitral valve regurgitation. Tricuspid Valve: The tricuspid valve is grossly normal. Tricuspid valve regurgitation is trivial. Aortic Valve: The aortic valve is tricuspid. Aortic valve regurgitation is not visualized. Pulmonic Valve: The pulmonic valve was grossly normal. Pulmonic valve regurgitation is trivial. Aorta: The aortic root is normal in size and structure. Venous: The inferior vena cava is dilated in size with less than 50% respiratory variability, suggesting right atrial pressure of 15 mmHg. IAS/Shunts: No atrial level shunt detected by color flow Doppler.  LEFT VENTRICLE PLAX 2D LVIDd:         3.70 cm  Diastology LVIDs:         2.93 cm  LV e' lateral:   12.90 cm/s LV PW:         1.53 cm  LV E/e' lateral: 8.9 LV IVS:        1.43 cm  LV e' medial:    8.49 cm/s LVOT diam:     2.10 cm  LV E/e' medial:  13.5 LVOT Area:     3.46 cm  RIGHT VENTRICLE RV S prime:     9.57 cm/s TAPSE (M-mode): 1.1 cm LEFT ATRIUM           Index       RIGHT ATRIUM           Index LA diam:      4.70 cm 2.02 cm/m  RA Area:     22.10 cm LA Vol (A2C): 52.7 ml 22.68 ml/m RA Volume:   72.10 ml  31.03 ml/m LA Vol (A4C): 49.8 ml 21.43 ml/m   AORTA Ao Root diam: 2.70 cm MITRAL VALVE                TRICUSPID VALVE MV Area (PHT): 7.51 cm     TR Peak grad:   13.8 mmHg MV Decel Time: 101 msec     TR Vmax:        186.00 cm/s MV E velocity: 115.00 cm/s MV A  velocity: 37.20 cm/s   SHUNTS MV E/A ratio:  3.09         Systemic Diam: 2.10 cm Rozann Lesches MD Electronically signed by Rozann Lesches MD Signature Date/Time: 01/13/2020/12:47:33 PM    Final    [place] EKG SITE RITE  Result Date: 01/15/2020 If Site Rite image not attached, placement could not be confirmed due to current cardiac rhythm.  US Abdomen Limited RUQ  Result Date: 01/12/2020 CLINICAL DATA:  Elevated liver enzymes EXAM: ULTRASOUND ABDOMEN LIMITED RIGHT UPPER QUADRANT COMPARISON:  None. FINDINGS: Gallbladder: No gallstones or wall thickening visualized. There is no pericholecystic fluid. No sonographic Murphy sign noted by sonographer. Common bile duct: Diameter: 4 mm. No intrahepatic or extrahepatic biliary duct dilatation. Liver: No focal lesion identified. Liver echogenicity is increased diffusely. Portal vein is patent on color Doppler imaging with normal direction of blood flow towards the liver. Other: None. IMPRESSION: Diffuse increase in liver echogenicity, a finding indicative of hepatic steatosis, potentially with underlying parenchymal liver disease as well. No focal liver lesions are evident; it must be cautioned that the sensitivity of ultrasound for detection of focal liver lesions is diminished in this circumstance. Study otherwise unremarkable. Electronically Signed   By: Lowella Grip III M.D.   On: 01/12/2020 10:12  Micro Results  Recent Results (from the past 240 hour(s))  Culture, blood (routine x 2)     Status: None   Collection Time: 01/13/20 12:16 PM   Specimen: BLOOD  Result Value Ref Range Status   Specimen Description BLOOD LEFT ANTECUBITAL  Final   Special Requests   Final    BOTTLES DRAWN AEROBIC AND ANAEROBIC Blood Culture adequate volume   Culture   Final    NO GROWTH 6 DAYS Performed at [facility], [address] [place], [place] [address]    Report Status 01/19/2020 FINAL  Final  Culture, blood (routine x 2)     Status: None   Collection Time:  01/13/20 12:16 PM   Specimen: BLOOD  Result Value Ref Range Status   Specimen Description BLOOD LEFT HAND  Final   Special Requests   Final    BOTTLES DRAWN AEROBIC AND ANAEROBIC Blood Culture adequate volume   Culture   Final    NO GROWTH 6 DAYS Performed at [facility], [address] [place], [place] [address]    Report Status 01/19/2020 FINAL  Final  Culture, blood (routine x 2)     Status: None   Collection Time: 01/14/20  4:56 AM   Specimen: BLOOD LEFT HAND  Result Value Ref Range Status   Specimen Description BLOOD LEFT HAND  Final   Special Requests   Final    BOTTLES DRAWN AEROBIC AND ANAEROBIC Blood Culture adequate volume   Culture   Final    NO GROWTH 5 DAYS Performed at [facility], [address], [place], [place] [address]    Report Status 01/19/2020 FINAL  Final  Culture, blood (routine x 2)     Status: None   Collection Time: 01/14/20  5:05 AM   Specimen: BLOOD LEFT HAND  Result Value Ref Range Status   Specimen Description BLOOD LEFT HAND  Final   Special Requests   Final    BOTTLES DRAWN AEROBIC AND ANAEROBIC Blood Culture adequate volume   Culture   Final    NO GROWTH 5 DAYS Performed at [facility], [address] [place], [place] [address]    Report Status 01/19/2020 FINAL  Final       Today   Subjective    Faythe Casa today has no new complaints  No fever  Or chills   No Nausea, Vomiting or Diarrhea -No chest pains, no dizziness, no palpitations, no dyspnea on exertion with ambulation        Patient has been seen and examined prior to discharge   Objective   Blood pressure 137/80, pulse 87, temperature 98.6 F (37 C), temperature source Oral, resp. rate 18, height _0  (1.702 m), weight 124.5 kg, SpO2 99 %.   Intake/Output Summary (Last 24 hours) at 01/22/2020 1254 Last data filed at 01/22/2020 8127 Gross per 24 hour  Intake 440 ml  Output --  Net 440 ml    Exam Gen:- Awake Alert, no acute distress  HEENT:- [place].AT, No  sclera icterus Neck-Supple Neck,No JVD,.  Lungs-  CTAB , good air movement bilaterally  CV- S1, S2 normal, irregular Abd-  +ve B.Sounds, Abd Soft, No tenderness,    Extremity/Skin:- No  edema,   good pulses Psych-affect is appropriate, oriented x3 Neuro-no new focal deficits, no tremors    Data Review   CBC w Diff:  Lab Results  Component Value Date   WBC 7.0 01/21/2020   HGB 13.1 01/21/2020   HCT 39.7 01/21/2020  PLT 277 01/21/2020   LYMPHOPCT 22 01/11/2020   MONOPCT 7 01/11/2020   EOSPCT 1 01/11/2020   BASOPCT 1 01/11/2020    CMP:  Lab Results  Component Value Date   NA 133 (L) 01/21/2020   K 4.1 01/21/2020   CL 104 01/21/2020   CO2 19 (L) 01/21/2020   BUN 6 01/21/2020   CREATININE 0.57 (L) 01/21/2020   PROT 6.3 (L) 01/19/2020   ALBUMIN 2.9 (L) 01/21/2020   BILITOT 1.8 (H) 01/19/2020   ALKPHOS 117 01/19/2020   AST 57 (H) 01/19/2020   ALT 43 01/19/2020  .   Total Discharge time is about 33 minutes  Roxan Hockey M.D on 01/22/2020 at 12:54 PM  Go to [email or web address] -  for contact info  [facility] - Office  [phone]

## 2020-01-22 NOTE — Discharge Instructions (Signed)
--  1) you were given a free coupon for apixaban/Eliquis--- so you should be able to get your first month supply free at the pharmacy 2) you are taking apixaban/Eliquis which is your blood thinner so please Avoid ibuprofen/Advil/Aleve/Motrin/Goody Powders/Naproxen/BC powders/Meloxicam/Diclofenac/Indomethacin and other Nonsteroidal anti-inflammatory medications as these will make you more likely to bleed and can cause stomach ulcers, can also cause Kidney problems.   3)Complete abstinence from alcohol strongly advised--- alcohol lowers your platelet count which would put you at risk for bleeding while taking Eliquis/Apixaban  4)Outpatient follow-up with cardiology advised--- you may need cardioversion as outpatient if your heart rate remains stubbornly high despite medications   Information on my medicine - ELIQUIS (apixaban)  This medication education was reviewed with me or my healthcare representative as part of my discharge preparation.   Why was Eliquis prescribed for you? Eliquis was prescribed for you to reduce the risk of a blood clot forming that can cause a stroke if you have a medical condition called atrial fibrillation (a type of irregular heartbeat).  What do You need to know about Eliquis ? Take your Eliquis TWICE DAILY - one tablet in the morning and one tablet in the evening with or without food. If you have difficulty swallowing the tablet whole please discuss with your pharmacist how to take the medication safely.  Take Eliquis exactly as prescribed by your doctor and DO NOT stop taking Eliquis without talking to the doctor who prescribed the medication.  Stopping may increase your risk of developing a stroke.  Refill your prescription before you run out.  After discharge, you should have regular check-up appointments with your healthcare provider that is prescribing your Eliquis.  In the future your dose may need to be changed if your kidney function or weight changes by a  significant amount or as you get older.  What do you do if you miss a dose? If you miss a dose, take it as soon as you remember on the same day and resume taking twice daily.  Do not take more than one dose of ELIQUIS at the same time to make up a missed dose.  Important Safety Information A possible side effect of Eliquis is bleeding. You should call your healthcare provider right away if you experience any of the following: ? Bleeding from an injury or your nose that does not stop. ? Unusual colored urine (red or dark brown) or unusual colored stools (red or black). ? Unusual bruising for unknown reasons. ? A serious fall or if you hit your head (even if there is no bleeding).  Some medicines may interact with Eliquis and might increase your risk of bleeding or clotting while on Eliquis. To help avoid this, consult your healthcare provider or pharmacist prior to using any new prescription or non-prescription medications, including herbals, vitamins, non-steroidal anti-inflammatory drugs (NSAIDs) and supplements.  This website has more information on Eliquis (apixaban): http://www.eliquis.com/eliquis/home

## 2020-01-22 NOTE — Plan of Care (Signed)
  Problem: Education: Goal: Knowledge of General Education information will improve Description: Including pain rating scale, medication(s)/side effects and non-pharmacologic comfort measures Outcome: Adequate for Discharge   Problem: Health Behavior/Discharge Planning: Goal: Ability to manage health-related needs will improve Outcome: Adequate for Discharge   Problem: Clinical Measurements: Goal: Ability to maintain clinical measurements within normal limits will improve Outcome: Adequate for Discharge Goal: Will remain free from infection Outcome: Adequate for Discharge Goal: Diagnostic test results will improve Outcome: Adequate for Discharge Goal: Cardiovascular complication will be avoided Outcome: Adequate for Discharge   Problem: Activity: Goal: Risk for activity intolerance will decrease Outcome: Adequate for Discharge   Problem: Coping: Goal: Level of anxiety will decrease Outcome: Adequate for Discharge   Problem: Elimination: Goal: Will not experience complications related to bowel motility Outcome: Adequate for Discharge Goal: Will not experience complications related to urinary retention Outcome: Adequate for Discharge   Problem: Pain Managment: Goal: General experience of comfort will improve Outcome: Adequate for Discharge   Problem: Safety: Goal: Ability to remain free from injury will improve Outcome: Adequate for Discharge   Problem: Skin Integrity: Goal: Risk for impaired skin integrity will decrease Outcome: Adequate for Discharge   Problem: Activity: Goal: Ability to tolerate increased activity will improve Outcome: Adequate for Discharge

## 2020-01-27 ENCOUNTER — Other Ambulatory Visit: Payer: Self-pay

## 2020-01-27 ENCOUNTER — Ambulatory Visit (INDEPENDENT_AMBULATORY_CARE_PROVIDER_SITE_OTHER): Payer: Self-pay | Admitting: Student

## 2020-01-27 ENCOUNTER — Encounter: Payer: Self-pay | Admitting: Student

## 2020-01-27 VITALS — BP 130/60 | HR 95 | Ht 68.0 in | Wt 260.0 lb

## 2020-01-27 DIAGNOSIS — I4819 Other persistent atrial fibrillation: Secondary | ICD-10-CM

## 2020-01-27 DIAGNOSIS — I10 Essential (primary) hypertension: Secondary | ICD-10-CM

## 2020-01-27 DIAGNOSIS — I429 Cardiomyopathy, unspecified: Secondary | ICD-10-CM

## 2020-01-27 DIAGNOSIS — F1010 Alcohol abuse, uncomplicated: Secondary | ICD-10-CM

## 2020-01-27 MED ORDER — FUROSEMIDE 20 MG PO TABS: 20.0000 mg | ORAL_TABLET | ORAL | 6 refills | Status: DC | PRN

## 2020-01-27 NOTE — H&P (View-Only) (Signed)
Cardiology Office Note    Date:  01/27/2020   ID:  Omar Mccarthy, DOB 1990/10/20, MRN [id number]  PCP:  [facility]  Cardiologist: Carlyle Dolly, MD    Chief Complaint  Patient presents with  . Hospitalization Follow-up    History of Present Illness:    Omar Mccarthy is a 29 y.o. male with past medical history of HTN and alcohol abuse who presents to the office today for hospital follow-up.  He recently presented to [facility] ED on 01/11/2020 for worsening dyspnea and a 30 pound weight gain within the past few months. He did report consuming 24-26 beers on a daily basis and consuming 1/2 gallon of vodka on a daily basis. Was found to be in atrial fibrillation with RVR upon admission and was started on IV Cardizem and IV Lopressor. He ultimately required IV Amiodarone as well given soft BP. CTA chest was negative for PE. He did have a reduced EF of 40% by his echocardiogram with global hypokinesis. RV function was moderately reduced as well. He was ultimately going to undergo a TEE guided cardioversion on 01/21/2020 but he wished to postpone the procedure at that time and rate control was pursued. He was discharged on Eliquis 5 mg twice daily, Digoxin 0.125 mg daily, Cardizem CD 360 mg daily and Toprol-XL 100 mg daily. Was recommended to consider an outpatient cardioversion if compliant with medications and follow-up. His hospitalization was complicated by DT's and required intubation during admission. Also diagnosed with alcoholic hepatitis with elevated LFT's and outpatient GI follow-up was recommended. He met sepsis criteria on admission in the setting of lower extremity cellulitis and was treated with antibiotics.    In talking with the patient and his mom today, he reports overall doing well since returning home. He does get short of breath when taking out the trash or walking the dog but says this resolves with rest. No recent orthopnea or PND. He denies any  exertional chest pain but does experience intermittent palpitations when active. No recent orthopnea or PND. He had noticed lower extremity edema prior to his hospital admission and says this has significantly improved since but he continues to notice this.  He denies any recurrent alcohol use since hospital discharge!  Reports good compliance with his current medication regimen and denies missing any recent doses.   Past Medical History:  Diagnosis Date  . Atrial fibrillation ([place])    a. diagnosed in 12/2019  . Hypertension     History reviewed. No pertinent surgical history.  Current Medications: Outpatient Medications Prior to Visit  Medication Sig Dispense Refill  . acetaminophen (TYLENOL) 325 MG tablet Take 2 tablets (650 mg total) by mouth every 6 (six) hours as needed for mild pain, fever or headache. 12 tablet 0  . apixaban (ELIQUIS) 5 MG TABS tablet Take 1 tablet (5 mg total) by mouth 2 (two) times daily. 60 tablet 3  . digoxin (LANOXIN) 0.125 MG tablet Take 1 tablet (0.125 mg total) by mouth daily. 30 tablet 1  . metoprolol succinate (TOPROL-XL) 100 MG 24 hr tablet Take 1 tablet (100 mg total) by mouth daily. Take with or immediately following a meal. 30 tablet 3  . diltiazem (CARDIZEM CD) 360 MG 24 hr capsule Take 1 capsule (360 mg total) by mouth daily. (Patient not taking: Reported on 01/27/2020) 30 capsule 2  . folic acid (FOLVITE) 1 MG tablet Take 1 tablet (1 mg total) by mouth daily. (Patient not taking: Reported on  01/27/2020) 30 tablet 2  . Multiple Vitamin (MULTIVITAMIN WITH MINERALS) TABS tablet Take 1 tablet by mouth daily. (Patient not taking: Reported on 01/27/2020) 30 tablet 2  . nicotine (NICODERM CQ - DOSED IN MG/24 HOURS) 14 mg/24hr patch Place 1 patch (14 mg total) onto the skin daily for 28 days. (Patient not taking: Reported on 01/27/2020) 28 patch 0  . pantoprazole (PROTONIX) 40 MG tablet Take 1 tablet (40 mg total) by mouth daily. (Patient not taking: Reported on  01/27/2020) 30 tablet 3  . thiamine 100 MG tablet Take 1 tablet (100 mg total) by mouth daily. (Patient not taking: Reported on 01/27/2020) 30 tablet 1  . cephALEXin (KEFLEX) 500 MG capsule Take 1 capsule (500 mg total) by mouth 3 (three) times daily for 5 days. 15 capsule 0   No facility-administered medications prior to visit.     Allergies:   Patient has no known allergies.   Social History   Socioeconomic History  . Marital status: Married    Spouse name: Not on file  . Number of children: Not on file  . Years of education: Not on file  . Highest education level: Not on file  Occupational History  . Not on file  Tobacco Use  . Smoking status: Current Every Day Smoker    Packs/day: 1.00  . Smokeless tobacco: Never Used  Vaping Use  . Vaping Use: Never used  Substance and Sexual Activity  . Alcohol use: Not Currently    Comment: stopped 01/12/20  . Drug use: Never  . Sexual activity: Not on file  Other Topics Concern  . Not on file  Social History Narrative  . Not on file   Social Determinants of Health   Financial Resource Strain:   . Difficulty of Paying Living Expenses: Not on file  Food Insecurity:   . Worried About Charity fundraiser in the Last Year: Not on file  . Ran Out of Food in the Last Year: Not on file  Transportation Needs:   . Lack of Transportation (Medical): Not on file  . Lack of Transportation (Non-Medical): Not on file  Physical Activity:   . Days of Exercise per Week: Not on file  . Minutes of Exercise per Session: Not on file  Stress:   . Feeling of Stress : Not on file  Social Connections:   . Frequency of Communication with Friends and Family: Not on file  . Frequency of Social Gatherings with Friends and Family: Not on file  . Attends Religious Services: Not on file  . Active Member of Clubs or Organizations: Not on file  . Attends Archivist Meetings: Not on file  . Marital Status: Not on file     Family History:  The  patient's family history includes Colon cancer in his mother; High Cholesterol in his father and mother; High blood pressure in his father and mother.   Review of Systems:   Please see the history of present illness.     General:  No chills, fever, night sweats or weight changes.  Cardiovascular:  No exertional chest pain, orthopnea, paroxysmal nocturnal dyspnea.  Positive for dyspnea on exertion, edema and palpitations. Dermatological: No rash, lesions/masses Respiratory: No cough. Positive for dyspnea. Urologic: No hematuria, dysuria Abdominal:   No nausea, vomiting, diarrhea, bright red blood per rectum, melena, or hematemesis Neurologic:  No visual changes, wkns, changes in mental status. All other systems reviewed and are otherwise negative except as noted above.  Physical Exam:    VS:  BP 130/60   Pulse 95   Ht 5\' 8"  (1.727 m)   Wt 260 lb (117.9 kg)   SpO2 96%   BMI 39.53 kg/m    General: Well developed, well nourished,male appearing in no acute distress. Head: Normocephalic, atraumatic. Neck: No carotid bruits. JVD not elevated.  Lungs: Respirations regular and unlabored, without wheezes or rales.  Heart: Irregularly irregular. No S3 or S4.  No murmur, no rubs, or gallops appreciated. Abdomen: Appears non-distended. No obvious abdominal masses. Msk:  Strength and tone appear normal for age. No obvious joint deformities or effusions. Extremities: No clubbing or cyanosis. 1+ pitting edema along LLE, trace edema along RLE.  Distal pedal pulses are 2+ bilaterally. Neuro: Alert and oriented X 3. Moves all extremities spontaneously. No focal deficits noted. Psych:  Responds to questions appropriately with a normal affect. Skin: No rashes or lesions noted  Wt Readings from Last 3 Encounters:  01/27/20 260 lb (117.9 kg)  01/22/20 274 lb 7.6 oz (124.5 kg)  08/04/19 260 lb (117.9 kg)     Studies/Labs Reviewed:   EKG:  EKG is ordered today.  The ekg ordered today demonstrates  rate-controlled atrial fibrillation, HR 95 with no acute ST abnormalities when compared to   Recent Labs: 01/11/2020: B Natriuretic Peptide 279.0; TSH 3.890 01/19/2020: ALT 43; Magnesium 1.9 01/21/2020: BUN 6; Creatinine, Ser 0.57; Hemoglobin 13.1; Platelets 277; Potassium 4.1; Sodium 133   Lipid Panel    Component Value Date/Time   TRIG 149 01/16/2020 0523    Additional studies/ records that were reviewed today include:   Echocardiogram: 12/2019 IMPRESSIONS    1. Left ventricular ejection fraction, by estimation, is approximately  40%. The left ventricle has moderately decreased function. The left  ventricle demonstrates global hypokinesis with some regionla variation.  There is moderate left ventricular  hypertrophy. Left ventricular diastolic parameters are indeterminate in  the setting of atrial fibrillation. No obvious LV mural thrombus.  2. Right ventricular systolic function is moderately reduced. The right  ventricular size is normal. There is normal pulmonary artery systolic  pressure. The estimated right ventricular systolic pressure is 28.8 mmHg.  3. The mitral valve is grossly normal. Mild mitral valve regurgitation.  4. The aortic valve is tricuspid. Aortic valve regurgitation is not  visualized.  5. The inferior vena cava is dilated in size with <50% respiratory  variability, suggesting right atrial pressure of 15 mmHg.   Assessment:    1. Persistent atrial fibrillation (HCC)   2. Secondary cardiomyopathy (HCC)   3. Essential hypertension   4. Alcohol abuse      Plan:   In order of problems listed above:  1. Persistent Atrial Fibrillation - This was diagnosed during his recent admission as outlined above. He remains in rate-controlled atrial fibrillation today but does report having palpitations and dyspnea when active. His heart rate is in the 90's during today's visit and he denies any symptoms currently. He is on Digoxin 0.125 mg daily, Cardizem CD  360 mg daily and Toprol-XL 100 mg daily  He wishes to continue with medical therapy for now and would consider a DCCV if he remains in atrial fibrillation after being on medications for an additional 2 to 3 weeks. The procedure was reviewed with the patient and his mother in detail today including risk and benefits. Will have him come back for a nurse visit for an EKG in 3 weeks and if still in atrial fibrillation, will arrange for  DCCV. He has been compliant with Eliquis and denies missing any recent doses. I encouraged him to make [place] aware if he misses any doses in the interim as he would require a TEE. Continue Eliquis 5 mg twice daily.  2.  Secondary Cardiomyopathy - His EF was reduced at 40% by most recent echocardiogram and was felt to be tachycardia mediated in the setting of atrial fibrillation but could also be due to his significant alcohol abuse prior to admission. - He does have 1+ pitting edema on examination today and was provided with an Rx for Lasix 20 mg to take as needed for edema or weight gain. - He remains on Toprol-XL 100 mg daily and while Cardizem CD is not ideal in the setting of his reduced EF, this has been needed for his elevated rates. Once back in normal sinus rhythm, would plan to discontinue Cardizem.  3.  HTN - BP is at 130/60 during today's visit but he was actually hypotensive during his recent admission. He does not have a blood pressure cuff at home and given his variable readings, will continue his current medication regimen for now.  4.  Alcohol Abuse - By review of notes, he was consuming 24-26 beers and a half-gallon of vodka on a daily basis prior to his recent admission. He denies any recurrent alcohol use since hospital discharge and was congratulated on this.   Medication Adjustments/Labs and Tests Ordered: Current medicines are reviewed at length with the patient today.  Concerns regarding medicines are outlined above.  Medication changes, Labs and Tests  ordered today are listed in the Patient Instructions below. Patient Instructions   Medication Instructions:  Your physician has recommended you make the following change in your medication:  Take Lasix 20 mg As needed for swelling   *If you need a refill on your cardiac medications before your next appointment, please call your pharmacy*   Lab Work: NONE   If you have labs (blood work) drawn today and your tests are completely normal, you will receive your results only by: Marland Kitchen MyChart Message (if you have MyChart) OR . A paper copy in the mail If you have any lab test that is abnormal or we need to change your treatment, we will call you to review the results.   Testing/Procedures: NONE    Follow-Up: At [facility], you and your health needs are our priority.  As part of our continuing mission to provide you with exceptional heart care, we have created designated Provider Care Teams.  These Care Teams include your primary Cardiologist (physician) and Advanced Practice Providers (APPs -  Physician Assistants and Nurse Practitioners) who all work together to provide you with the care you need, when you need it.  We recommend signing up for the patient portal called "MyChart".  Sign up information is provided on this After Visit Summary.  MyChart is used to connect with patients for Virtual Visits (Telemedicine).  Patients are able to view lab/test results, encounter notes, upcoming appointments, etc.  Non-urgent messages can be sent to your provider as well.   To learn more about what you can do with MyChart, go to [email or web address].    Your next appointment:   2 month(s)  The format for your next appointment:   In Person  Provider:   Carlyle Dolly, MD or Bernerd Pho, PA-C   Other Instructions Follow up in 3 weeks in nurse visit for an EKG  Thank you for choosing [facility]!  Two Gram Sodium Diet 2000 mg  What is Sodium? Sodium is a mineral  found naturally in many foods. The most significant source of sodium in the diet is table salt, which is about 40% sodium.  Processed, convenience, and preserved foods also contain a large amount of sodium.  The body needs only 500 mg of sodium daily to function,  A normal diet provides more than enough sodium even if you do not use salt.  Why Limit Sodium? A build up of sodium in the body can cause thirst, increased blood pressure, shortness of breath, and water retention.  Decreasing sodium in the diet can reduce edema and risk of heart attack or stroke associated with high blood pressure.  Keep in mind that there are many other factors involved in these health problems.  Heredity, obesity, lack of exercise, cigarette smoking, stress and what you eat all play a role.  General Guidelines:  Do not add salt at the table or in cooking.  One teaspoon of salt contains over 2 grams of sodium.  Read food labels  Avoid processed and convenience foods  Ask your dietitian before eating any foods not dicussed in the menu planning guidelines  Consult your physician if you wish to use a salt substitute or a sodium containing medication such as antacids.  Limit milk and milk products to 16 oz (2 cups) per day.  Shopping Hints:  READ LABELS!! "Dietetic" does not necessarily mean low sodium.  Salt and other sodium ingredients are often added to foods during processing.   Menu Planning Guidelines Food Group Choose More Often Avoid  Beverages (see also the milk group All fruit juices, low-sodium, salt-free vegetables juices, low-sodium carbonated beverages Regular vegetable or tomato juices, commercially softened water used for drinking or cooking  Breads and Cereals Enriched white, wheat, rye and pumpernickel bread, hard rolls and dinner rolls; muffins, cornbread and waffles; most dry cereals, cooked cereal without added salt; unsalted crackers and breadsticks; low sodium or homemade bread crumbs Bread,  rolls and crackers with salted tops; quick breads; instant hot cereals; pancakes; commercial bread stuffing; self-rising flower and biscuit mixes; regular bread crumbs or cracker crumbs  Desserts and Sweets Desserts and sweets mad with mild should be within allowance Instant pudding mixes and cake mixes  Fats Butter or margarine; vegetable oils; unsalted salad dressings, regular salad dressings limited to 1 Tbs; light, sour and heavy cream Regular salad dressings containing bacon fat, bacon bits, and salt pork; snack dips made with instant soup mixes or processed cheese; salted nuts  Fruits Most fresh, frozen and canned fruits Fruits processed with salt or sodium-containing ingredient (some dried fruits are processed with sodium sulfites        Vegetables Fresh, frozen vegetables and low- sodium canned vegetables Regular canned vegetables, sauerkraut, pickled vegetables, and others prepared in brine; frozen vegetables in sauces; vegetables seasoned with ham, bacon or salt pork  Condiments, Sauces, Miscellaneous  Salt substitute with physician's approval; pepper, herbs, spices; vinegar, lemon or lime juice; hot pepper sauce; garlic powder, onion powder, low sodium soy sauce (1 Tbs.); low sodium condiments (ketchup, chili sauce, mustard) in limited amounts (1 tsp.) fresh ground horseradish; unsalted tortilla chips, pretzels, potato chips, popcorn, salsa (1/4 cup) Any seasoning made with salt including garlic salt, celery salt, onion salt, and seasoned salt; sea salt, rock salt, kosher salt; meat tenderizers; monosodium glutamate; mustard, regular soy sauce, barbecue, sauce, chili sauce, teriyaki sauce, steak sauce, Worcestershire sauce, and most flavored vinegars; canned gravy and  mixes; regular condiments; salted snack foods, olives, picles, relish, horseradish sauce, catsup   Food preparation: Try these seasonings Meats:    Pork Sage, onion Serve with applesauce  Chicken Poultry seasoning, thyme,  parsley Serve with cranberry sauce  Lamb Curry powder, rosemary, garlic, thyme Serve with mint sauce or jelly  Veal Marjoram, basil Serve with current jelly, cranberry sauce  Beef Pepper, bay leaf Serve with dry mustard, unsalted chive butter  Fish Bay leaf, dill Serve with unsalted lemon butter, unsalted parsley butter  Vegetables:    Asparagus Lemon juice   Broccoli Lemon juice   Carrots Mustard dressing parsley, mint, nutmeg, glazed with unsalted butter and sugar   Green beans Marjoram, lemon juice, nutmeg,dill seed   Tomatoes Basil, marjoram, onion   Spice /blend for [employer]" 4 tsp ground thyme 1 tsp ground sage 3 tsp ground rosemary 4 tsp ground marjoram   Test your knowledge 1. A product that says "Salt Free" may still contain sodium. True or False 2. Garlic Powder and Hot Pepper Sauce an be used as alternative seasonings.True or False 3. Processed foods have more sodium than fresh foods.  True or False 4. Canned Vegetables have less sodium than froze True or False  WAYS TO DECREASE YOUR SODIUM INTAKE 1. Avoid the use of added salt in cooking and at the table.  Table salt (and other prepared seasonings which contain salt) is probably one of the greatest sources of sodium in the diet.  Unsalted foods can gain flavor from the sweet, sour, and butter taste sensations of herbs and spices.  Instead of using salt for seasoning, try the following seasonings with the foods listed.  Remember: how you use them to enhance natural food flavors is limited only by your creativity... Allspice-Meat, fish, eggs, fruit, peas, red and yellow vegetables Almond Extract-Fruit baked goods Anise Seed-Sweet breads, fruit, carrots, beets, cottage cheese, cookies (tastes like licorice) Basil-Meat, fish, eggs, vegetables, rice, vegetables salads, soups, sauces Bay Leaf-Meat, fish, stews, poultry Burnet-Salad, vegetables (cucumber-like flavor) Caraway Seed-Bread, cookies, cottage cheese, meat, vegetables,  cheese, rice Cardamon-Baked goods, fruit, soups Celery Powder or seed-Salads, salad dressings, sauces, meatloaf, soup, bread.Do not use  celery salt Chervil-Meats, salads, fish, eggs, vegetables, cottage cheese (parsley-like flavor) Chili Power-Meatloaf, chicken cheese, corn, eggplant, egg dishes Chives-Salads cottage cheese, egg dishes, soups, vegetables, sauces Cilantro-Salsa, casseroles Cinnamon-Baked goods, fruit, pork, lamb, chicken, carrots Cloves-Fruit, baked goods, fish, pot roast, green beans, beets, carrots Coriander-Pastry, cookies, meat, salads, cheese (lemon-orange flavor) Cumin-Meatloaf, fish,cheese, eggs, cabbage,fruit pie (caraway flavor) [employer], fruit, eggs, fish, poultry, cottage cheese, vegetables Dill Seed-Meat, cottage cheese, poultry, vegetables, fish, salads, bread Fennel Seed-Bread, cookies, apples, pork, eggs, fish, beets, cabbage, cheese, Licorice-like flavor Garlic-(buds or powder) Salads, meat, poultry, fish, bread, butter, vegetables, potatoes.Do not  use garlic salt Ginger-Fruit, vegetables, baked goods, meat, fish, poultry Horseradish Root-Meet, vegetables, butter Lemon Juice or Extract-Vegetables, fruit, tea, baked goods, fish salads Mace-Baked goods fruit, vegetables, fish, poultry (taste like nutmeg) Maple Extract-Syrups Marjoram-Meat, chicken, fish, vegetables, breads, green salads (taste like Sage) Mint-Tea, lamb, sherbet, vegetables, desserts, carrots, cabbage Mustard, Dry or Seed-Cheese, eggs, meats, vegetables, poultry Nutmeg-Baked goods, fruit, chicken, eggs, vegetables, desserts Onion Powder-Meat, fish, poultry, vegetables, cheese, eggs, bread, rice salads (Do not use   Onion salt) Orange Extract-Desserts, baked goods Oregano-Pasta, eggs, cheese, onions, pork, lamb, fish, chicken, vegetables, green salads Paprika-Meat, fish, poultry, eggs, cheese, vegetables Parsley Flakes-Butter, vegetables, meat fish, poultry, eggs, bread, salads  (certain forms may   Contain sodium Pepper-Meat fish,  poultry, vegetables, eggs Peppermint Extract-Desserts, baked goods Poppy Seed-Eggs, bread, cheese, fruit dressings, baked goods, noodles, vegetables, cottage  [employer], poultry, meat, fish, cauliflower, turnips,eggs bread Saffron-Rice, bread, veal, chicken, fish, eggs Sage-Meat, fish, poultry, onions, eggplant, tomateos, pork, stews Savory-Eggs, salads, poultry, meat, rice, vegetables, soups, pork Tarragon-Meat, poultry, fish, eggs, butter, vegetables (licorice-like flavor)  Thyme-Meat, poultry, fish, eggs, vegetables, (clover-like flavor), sauces, soups Tumeric-Salads, butter, eggs, fish, rice, vegetables (saffron-like flavor) Vanilla Extract-Baked goods, candy Vinegar-Salads, vegetables, meat marinades Walnut Extract-baked goods, candy  2. Choose your Foods Wisely   The following is a list of foods to avoid which are high in sodium:  Meats-Avoid all smoked, canned, salt cured, dried and kosher meat and fish as well as Anchovies   [employer] Rx meats:Bologna, Liverwurst, Pastrami Canned meat or fish  Marinated herring Caviar    Pepperoni Corned Beef   Pizza Dried chipped beef  Salami Frozen breaded fish or meat Salt pork Frankfurters or hot dogs  Sardines Gefilte fish   Sausage Ham (boiled ham, Proscuitto Smoked butt    spiced ham)   Spam      TV Dinners Vegetables Canned vegetables (Regular) Relish Canned mushrooms  Sauerkraut Olives    Tomato juice Pickles  Bakery and Dessert Products Canned puddings  Cream pies Cheesecake   Decorated cakes Cookies  Beverages/Juices Tomato juice, regular  Gatorade   V-8 vegetable juice, regular  Breads and Cereals Biscuit mixes   Salted potato chips, corn chips, pretzels Bread stuffing mixes  Salted crackers and rolls Pancake and waffle mixes Self-rising flour  Seasonings Accent    Meat sauces Barbecue sauce  Meat  tenderizer Catsup    Monosodium glutamate (MSG) Celery salt   Onion salt Chili sauce   Prepared mustard Garlic salt   Salt, seasoned salt, sea salt Gravy mixes   Soy sauce Horseradish   Steak sauce Ketchup   Tartar sauce Lite salt    Teriyaki sauce Marinade mixes   Worcestershire sauce  Others Baking powder   Cocoa and cocoa mixes Baking soda   Commercial casserole mixes Candy-caramels, chocolate  Dehydrated soups    Bars, fudge,nougats  Instant rice and pasta mixes Canned broth or soup  Maraschino cherries Cheese, aged and processed cheese and cheese spreads      Signed, Erma Heritage, PA-C  01/27/2020 5:31 PM    [facility] [address] [place], [place] [address] Phone: [phone] Fax: [phone]

## 2020-01-27 NOTE — Patient Instructions (Addendum)
Medication Instructions:  Your physician has recommended you make the following change in your medication:  Take Lasix 20 mg As needed for swelling   *If you need a refill on your cardiac medications before your next appointment, please call your pharmacy*   Lab Work: NONE   If you have labs (blood work) drawn today and your tests are completely normal, you will receive your results only by: Marland Kitchen MyChart Message (if you have MyChart) OR . A paper copy in the mail If you have any lab test that is abnormal or we need to change your treatment, we will call you to review the results.   Testing/Procedures: NONE    Follow-Up: At [facility], you and your health needs are our priority.  As part of our continuing mission to provide you with exceptional heart care, we have created designated Provider Care Teams.  These Care Teams include your primary Cardiologist (physician) and Advanced Practice Providers (APPs -  Physician Assistants and Nurse Practitioners) who all work together to provide you with the care you need, when you need it.  We recommend signing up for the patient portal called "MyChart".  Sign up information is provided on this After Visit Summary.  MyChart is used to connect with patients for Virtual Visits (Telemedicine).  Patients are able to view lab/test results, encounter notes, upcoming appointments, etc.  Non-urgent messages can be sent to your provider as well.   To learn more about what you can do with MyChart, go to [email or web address].    Your next appointment:   2 month(s)  The format for your next appointment:   In Person  Provider:   Dina Rich, MD or Randall An, PA-C   Other Instructions Follow up in 3 weeks in nurse visit for an EKG  Thank you for choosing [facility]!     Two Gram Sodium Diet 2000 mg  What is Sodium? Sodium is a mineral found naturally in many foods. The most significant source of sodium in the diet is  table salt, which is about 40% sodium.  Processed, convenience, and preserved foods also contain a large amount of sodium.  The body needs only 500 mg of sodium daily to function,  A normal diet provides more than enough sodium even if you do not use salt.  Why Limit Sodium? A build up of sodium in the body can cause thirst, increased blood pressure, shortness of breath, and water retention.  Decreasing sodium in the diet can reduce edema and risk of heart attack or stroke associated with high blood pressure.  Keep in mind that there are many other factors involved in these health problems.  Heredity, obesity, lack of exercise, cigarette smoking, stress and what you eat all play a role.  General Guidelines:  Do not add salt at the table or in cooking.  One teaspoon of salt contains over 2 grams of sodium.  Read food labels  Avoid processed and convenience foods  Ask your dietitian before eating any foods not dicussed in the menu planning guidelines  Consult your physician if you wish to use a salt substitute or a sodium containing medication such as antacids.  Limit milk and milk products to 16 oz (2 cups) per day.  Shopping Hints:  READ LABELS!! "Dietetic" does not necessarily mean low sodium.  Salt and other sodium ingredients are often added to foods during processing.   Menu Planning Guidelines Food Group Choose More Often Avoid  Beverages (see also the milk  group All fruit juices, low-sodium, salt-free vegetables juices, low-sodium carbonated beverages Regular vegetable or tomato juices, commercially softened water used for drinking or cooking  Breads and Cereals Enriched white, wheat, rye and pumpernickel bread, hard rolls and dinner rolls; muffins, cornbread and waffles; most dry cereals, cooked cereal without added salt; unsalted crackers and breadsticks; low sodium or homemade bread crumbs Bread, rolls and crackers with salted tops; quick breads; instant hot cereals; pancakes;  commercial bread stuffing; self-rising flower and biscuit mixes; regular bread crumbs or cracker crumbs  Desserts and Sweets Desserts and sweets mad with mild should be within allowance Instant pudding mixes and cake mixes  Fats Butter or margarine; vegetable oils; unsalted salad dressings, regular salad dressings limited to 1 Tbs; light, sour and heavy cream Regular salad dressings containing bacon fat, bacon bits, and salt pork; snack dips made with instant soup mixes or processed cheese; salted nuts  Fruits Most fresh, frozen and canned fruits Fruits processed with salt or sodium-containing ingredient (some dried fruits are processed with sodium sulfites        Vegetables Fresh, frozen vegetables and low- sodium canned vegetables Regular canned vegetables, sauerkraut, pickled vegetables, and others prepared in brine; frozen vegetables in sauces; vegetables seasoned with ham, bacon or salt pork  Condiments, Sauces, Miscellaneous  Salt substitute with physician's approval; pepper, herbs, spices; vinegar, lemon or lime juice; hot pepper sauce; garlic powder, onion powder, low sodium soy sauce (1 Tbs.); low sodium condiments (ketchup, chili sauce, mustard) in limited amounts (1 tsp.) fresh ground horseradish; unsalted tortilla chips, pretzels, potato chips, popcorn, salsa (1/4 cup) Any seasoning made with salt including garlic salt, celery salt, onion salt, and seasoned salt; sea salt, rock salt, kosher salt; meat tenderizers; monosodium glutamate; mustard, regular soy sauce, barbecue, sauce, chili sauce, teriyaki sauce, steak sauce, Worcestershire sauce, and most flavored vinegars; canned gravy and mixes; regular condiments; salted snack foods, olives, picles, relish, horseradish sauce, catsup   Food preparation: Try these seasonings Meats:    Pork Sage, onion Serve with applesauce  Chicken Poultry seasoning, thyme, parsley Serve with cranberry sauce  Lamb Curry powder, rosemary, garlic, thyme  Serve with mint sauce or jelly  Veal Marjoram, basil Serve with current jelly, cranberry sauce  Beef Pepper, bay leaf Serve with dry mustard, unsalted chive butter  [place] leaf, dill Serve with unsalted lemon butter, unsalted parsley butter  Vegetables:    Asparagus Lemon juice   Broccoli Lemon juice   Carrots Mustard dressing parsley, mint, nutmeg, glazed with unsalted butter and sugar   Green beans Marjoram, lemon juice, nutmeg,dill seed   Tomatoes Basil, marjoram, onion   Spice /blend for [employer]" 4 tsp ground thyme 1 tsp ground sage 3 tsp ground rosemary 4 tsp ground marjoram   Test your knowledge 1. A product that says "Salt Free" may still contain sodium. True or False 2. Garlic Powder and Hot Pepper Sauce an be used as alternative seasonings.True or False 3. Processed foods have more sodium than fresh foods.  True or False 4. Canned Vegetables have less sodium than froze True or False  WAYS TO DECREASE YOUR SODIUM INTAKE 1. Avoid the use of added salt in cooking and at the table.  Table salt (and other prepared seasonings which contain salt) is probably one of the greatest sources of sodium in the diet.  Unsalted foods can gain flavor from the sweet, sour, and butter taste sensations of herbs and spices.  Instead of using salt for seasoning, try the following  seasonings with the foods listed.  Remember: how you use them to enhance natural food flavors is limited only by your creativity... Allspice-Meat, fish, eggs, fruit, peas, red and yellow vegetables Almond Extract-Fruit baked goods Anise Seed-Sweet breads, fruit, carrots, beets, cottage cheese, cookies (tastes like licorice) Basil-Meat, fish, eggs, vegetables, rice, vegetables salads, soups, sauces Bay Leaf-Meat, fish, stews, poultry Burnet-Salad, vegetables (cucumber-like flavor) Caraway Seed-Bread, cookies, cottage cheese, meat, vegetables, cheese, rice Cardamon-Baked goods, fruit, soups Celery Powder or seed-Salads,  salad dressings, sauces, meatloaf, soup, bread.Do not use  celery salt Chervil-Meats, salads, fish, eggs, vegetables, cottage cheese (parsley-like flavor) Chili Power-Meatloaf, chicken cheese, corn, eggplant, egg dishes Chives-Salads cottage cheese, egg dishes, soups, vegetables, sauces Cilantro-Salsa, casseroles Cinnamon-Baked goods, fruit, pork, lamb, chicken, carrots Cloves-Fruit, baked goods, fish, pot roast, green beans, beets, carrots Coriander-Pastry, cookies, meat, salads, cheese (lemon-orange flavor) Cumin-Meatloaf, fish,cheese, eggs, cabbage,fruit pie (caraway flavor) [employer], fruit, eggs, fish, poultry, cottage cheese, vegetables Dill Seed-Meat, cottage cheese, poultry, vegetables, fish, salads, bread Fennel Seed-Bread, cookies, apples, pork, eggs, fish, beets, cabbage, cheese, Licorice-like flavor Garlic-(buds or powder) Salads, meat, poultry, fish, bread, butter, vegetables, potatoes.Do not  use garlic salt Ginger-Fruit, vegetables, baked goods, meat, fish, poultry Horseradish Root-Meet, vegetables, butter Lemon Juice or Extract-Vegetables, fruit, tea, baked goods, fish salads Mace-Baked goods fruit, vegetables, fish, poultry (taste like nutmeg) Maple Extract-Syrups Marjoram-Meat, chicken, fish, vegetables, breads, green salads (taste like Sage) Mint-Tea, lamb, sherbet, vegetables, desserts, carrots, cabbage Mustard, Dry or Seed-Cheese, eggs, meats, vegetables, poultry Nutmeg-Baked goods, fruit, chicken, eggs, vegetables, desserts Onion Powder-Meat, fish, poultry, vegetables, cheese, eggs, bread, rice salads (Do not use   Onion salt) Orange Extract-Desserts, baked goods Oregano-Pasta, eggs, cheese, onions, pork, lamb, fish, chicken, vegetables, green salads Paprika-Meat, fish, poultry, eggs, cheese, vegetables Parsley Flakes-Butter, vegetables, meat fish, poultry, eggs, bread, salads (certain forms may   Contain sodium Pepper-Meat fish, poultry, vegetables,  eggs Peppermint Extract-Desserts, baked goods Poppy Seed-Eggs, bread, cheese, fruit dressings, baked goods, noodles, vegetables, cottage  [employer], poultry, meat, fish, cauliflower, turnips,eggs bread Saffron-Rice, bread, veal, chicken, fish, eggs Sage-Meat, fish, poultry, onions, eggplant, tomateos, pork, stews Savory-Eggs, salads, poultry, meat, rice, vegetables, soups, pork Tarragon-Meat, poultry, fish, eggs, butter, vegetables (licorice-like flavor)  Thyme-Meat, poultry, fish, eggs, vegetables, (clover-like flavor), sauces, soups Tumeric-Salads, butter, eggs, fish, rice, vegetables (saffron-like flavor) Vanilla Extract-Baked goods, candy Vinegar-Salads, vegetables, meat marinades Walnut Extract-baked goods, candy  2. Choose your Foods Wisely   The following is a list of foods to avoid which are high in sodium:  Meats-Avoid all smoked, canned, salt cured, dried and kosher meat and fish as well as Anchovies   [employer] meats:Bologna, Liverwurst, Pastrami Canned meat or fish  Marinated herring Caviar    Pepperoni Corned Beef   Pizza Dried chipped beef  Salami Frozen breaded fish or meat Salt pork Frankfurters or hot dogs  Sardines Gefilte fish   Sausage Ham (boiled ham, Proscuitto Smoked butt    spiced ham)   Spam      TV Dinners Vegetables Canned vegetables (Regular) Relish Canned mushrooms  Sauerkraut Olives    Tomato juice Pickles  Bakery and Dessert Products Canned puddings  Cream pies Cheesecake   Decorated cakes Cookies  Beverages/Juices Tomato juice, regular  Gatorade   V-8 vegetable juice, regular  Breads and Cereals Biscuit mixes   Salted potato chips, corn chips, pretzels Bread stuffing mixes  Salted crackers and rolls Pancake and waffle mixes Self-rising flour  Seasonings Accent    Meat sauces  Barbecue sauce  Meat tenderizer Catsup    Monosodium glutamate (MSG) Celery salt   Onion salt Chili  sauce   Prepared mustard Garlic salt   Salt, seasoned salt, sea salt Gravy mixes   Soy sauce Horseradish   Steak sauce Ketchup   Tartar sauce Lite salt    Teriyaki sauce Marinade mixes   Worcestershire sauce  Others Baking powder   Cocoa and cocoa mixes Baking soda   Commercial casserole mixes Candy-caramels, chocolate  Dehydrated soups    Bars, fudge,nougats  Instant rice and pasta mixes Canned broth or soup  Maraschino cherries Cheese, aged and processed cheese and cheese spreads

## 2020-01-27 NOTE — Progress Notes (Signed)
Cardiology Office Note    Date:  01/27/2020   ID:  Omar Mccarthy, DOB 1990/10/20, MRN [id number]  PCP:  [facility]  Cardiologist: Carlyle Dolly, MD    Chief Complaint  Patient presents with  . Hospitalization Follow-up    History of Present Illness:    Omar Mccarthy is a 29 y.o. male with past medical history of HTN and alcohol abuse who presents to the office today for hospital follow-up.  He recently presented to [facility] ED on 01/11/2020 for worsening dyspnea and a 30 pound weight gain within the past few months. He did report consuming 24-26 beers on a daily basis and consuming 1/2 gallon of vodka on a daily basis. Was found to be in atrial fibrillation with RVR upon admission and was started on IV Cardizem and IV Lopressor. He ultimately required IV Amiodarone as well given soft BP. CTA chest was negative for PE. He did have a reduced EF of 40% by his echocardiogram with global hypokinesis. RV function was moderately reduced as well. He was ultimately going to undergo a TEE guided cardioversion on 01/21/2020 but he wished to postpone the procedure at that time and rate control was pursued. He was discharged on Eliquis 5 mg twice daily, Digoxin 0.125 mg daily, Cardizem CD 360 mg daily and Toprol-XL 100 mg daily. Was recommended to consider an outpatient cardioversion if compliant with medications and follow-up. His hospitalization was complicated by DT's and required intubation during admission. Also diagnosed with alcoholic hepatitis with elevated LFT's and outpatient GI follow-up was recommended. He met sepsis criteria on admission in the setting of lower extremity cellulitis and was treated with antibiotics.    In talking with the patient and his mom today, he reports overall doing well since returning home. He does get short of breath when taking out the trash or walking the dog but says this resolves with rest. No recent orthopnea or PND. He denies any  exertional chest pain but does experience intermittent palpitations when active. No recent orthopnea or PND. He had noticed lower extremity edema prior to his hospital admission and says this has significantly improved since but he continues to notice this.  He denies any recurrent alcohol use since hospital discharge!  Reports good compliance with his current medication regimen and denies missing any recent doses.   Past Medical History:  Diagnosis Date  . Atrial fibrillation ([place])    a. diagnosed in 12/2019  . Hypertension     History reviewed. No pertinent surgical history.  Current Medications: Outpatient Medications Prior to Visit  Medication Sig Dispense Refill  . acetaminophen (TYLENOL) 325 MG tablet Take 2 tablets (650 mg total) by mouth every 6 (six) hours as needed for mild pain, fever or headache. 12 tablet 0  . apixaban (ELIQUIS) 5 MG TABS tablet Take 1 tablet (5 mg total) by mouth 2 (two) times daily. 60 tablet 3  . digoxin (LANOXIN) 0.125 MG tablet Take 1 tablet (0.125 mg total) by mouth daily. 30 tablet 1  . metoprolol succinate (TOPROL-XL) 100 MG 24 hr tablet Take 1 tablet (100 mg total) by mouth daily. Take with or immediately following a meal. 30 tablet 3  . diltiazem (CARDIZEM CD) 360 MG 24 hr capsule Take 1 capsule (360 mg total) by mouth daily. (Patient not taking: Reported on 01/27/2020) 30 capsule 2  . folic acid (FOLVITE) 1 MG tablet Take 1 tablet (1 mg total) by mouth daily. (Patient not taking: Reported on  01/27/2020) 30 tablet 2  . Multiple Vitamin (MULTIVITAMIN WITH MINERALS) TABS tablet Take 1 tablet by mouth daily. (Patient not taking: Reported on 01/27/2020) 30 tablet 2  . nicotine (NICODERM CQ - DOSED IN MG/24 HOURS) 14 mg/24hr patch Place 1 patch (14 mg total) onto the skin daily for 28 days. (Patient not taking: Reported on 01/27/2020) 28 patch 0  . pantoprazole (PROTONIX) 40 MG tablet Take 1 tablet (40 mg total) by mouth daily. (Patient not taking: Reported on  01/27/2020) 30 tablet 3  . thiamine 100 MG tablet Take 1 tablet (100 mg total) by mouth daily. (Patient not taking: Reported on 01/27/2020) 30 tablet 1  . cephALEXin (KEFLEX) 500 MG capsule Take 1 capsule (500 mg total) by mouth 3 (three) times daily for 5 days. 15 capsule 0   No facility-administered medications prior to visit.     Allergies:   Patient has no known allergies.   Social History   Socioeconomic History  . Marital status: Married    Spouse name: Not on file  . Number of children: Not on file  . Years of education: Not on file  . Highest education level: Not on file  Occupational History  . Not on file  Tobacco Use  . Smoking status: Current Every Day Smoker    Packs/day: 1.00  . Smokeless tobacco: Never Used  Vaping Use  . Vaping Use: Never used  Substance and Sexual Activity  . Alcohol use: Not Currently    Comment: stopped 01/12/20  . Drug use: Never  . Sexual activity: Not on file  Other Topics Concern  . Not on file  Social History Narrative  . Not on file   Social Determinants of Health   Financial Resource Strain:   . Difficulty of Paying Living Expenses: Not on file  Food Insecurity:   . Worried About Charity fundraiser in the Last Year: Not on file  . Ran Out of Food in the Last Year: Not on file  Transportation Needs:   . Lack of Transportation (Medical): Not on file  . Lack of Transportation (Non-Medical): Not on file  Physical Activity:   . Days of Exercise per Week: Not on file  . Minutes of Exercise per Session: Not on file  Stress:   . Feeling of Stress : Not on file  Social Connections:   . Frequency of Communication with Friends and Family: Not on file  . Frequency of Social Gatherings with Friends and Family: Not on file  . Attends Religious Services: Not on file  . Active Member of Clubs or Organizations: Not on file  . Attends Archivist Meetings: Not on file  . Marital Status: Not on file     Family History:  The  patient's family history includes Colon cancer in his mother; High Cholesterol in his father and mother; High blood pressure in his father and mother.   Review of Systems:   Please see the history of present illness.     General:  No chills, fever, night sweats or weight changes.  Cardiovascular:  No exertional chest pain, orthopnea, paroxysmal nocturnal dyspnea.  Positive for dyspnea on exertion, edema and palpitations. Dermatological: No rash, lesions/masses Respiratory: No cough. Positive for dyspnea. Urologic: No hematuria, dysuria Abdominal:   No nausea, vomiting, diarrhea, bright red blood per rectum, melena, or hematemesis Neurologic:  No visual changes, wkns, changes in mental status. All other systems reviewed and are otherwise negative except as noted above.  Physical Exam:    VS:  BP 130/60   Pulse 95   Ht $R'5\' 8"'Zj$  (1.727 m)   Wt 260 lb (117.9 kg)   SpO2 96%   BMI 39.53 kg/m    General: Well developed, well nourished,male appearing in no acute distress. Head: Normocephalic, atraumatic. Neck: No carotid bruits. JVD not elevated.  Lungs: Respirations regular and unlabored, without wheezes or rales.  Heart: Irregularly irregular. No S3 or S4.  No murmur, no rubs, or gallops appreciated. Abdomen: Appears non-distended. No obvious abdominal masses. Msk:  Strength and tone appear normal for age. No obvious joint deformities or effusions. Extremities: No clubbing or cyanosis. 1+ pitting edema along LLE, trace edema along RLE.  Distal pedal pulses are 2+ bilaterally. Neuro: Alert and oriented X 3. Moves all extremities spontaneously. No focal deficits noted. Psych:  Responds to questions appropriately with a normal affect. Skin: No rashes or lesions noted  Wt Readings from Last 3 Encounters:  01/27/20 260 lb (117.9 kg)  01/22/20 274 lb 7.6 oz (124.5 kg)  08/04/19 260 lb (117.9 kg)     Studies/Labs Reviewed:   EKG:  EKG is ordered today.  The ekg ordered today demonstrates  rate-controlled atrial fibrillation, HR 95 with no acute ST abnormalities when compared to   Recent Labs: 01/11/2020: B Natriuretic Peptide 279.0; TSH 3.890 01/19/2020: ALT 43; Magnesium 1.9 01/21/2020: BUN 6; Creatinine, Ser 0.57; Hemoglobin 13.1; Platelets 277; Potassium 4.1; Sodium 133   Lipid Panel    Component Value Date/Time   TRIG 149 01/16/2020 0523    Additional studies/ records that were reviewed today include:   Echocardiogram: 12/2019 IMPRESSIONS    1. Left ventricular ejection fraction, by estimation, is approximately  40%. The left ventricle has moderately decreased function. The left  ventricle demonstrates global hypokinesis with some regionla variation.  There is moderate left ventricular  hypertrophy. Left ventricular diastolic parameters are indeterminate in  the setting of atrial fibrillation. No obvious LV mural thrombus.  2. Right ventricular systolic function is moderately reduced. The right  ventricular size is normal. There is normal pulmonary artery systolic  pressure. The estimated right ventricular systolic pressure is 32.9 mmHg.  3. The mitral valve is grossly normal. Mild mitral valve regurgitation.  4. The aortic valve is tricuspid. Aortic valve regurgitation is not  visualized.  5. The inferior vena cava is dilated in size with <50% respiratory  variability, suggesting right atrial pressure of 15 mmHg.   Assessment:    1. 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 Essential hypertension   4. Alcohol abuse      Plan:   In order of problems listed above:  1. Persistent Atrial Fibrillation - This was diagnosed during his recent admission as outlined above. He remains in rate-controlled atrial fibrillation today but does report having palpitations and dyspnea when active. His heart rate is in the 90's during today's visit and he denies any symptoms currently. He is on Digoxin 0.125 mg daily, Cardizem CD  360 mg daily and Toprol-XL 100 mg daily  He wishes to continue with medical therapy for now and would consider a DCCV if he remains in atrial fibrillation after being on medications for an additional 2 to 3 weeks. The procedure was reviewed with the patient and his mother in detail today including risk and benefits. Will have him come back for a nurse visit for an EKG in 3 weeks and if still in atrial fibrillation, will arrange for  DCCV. He has been compliant with Eliquis and denies missing any recent doses. I encouraged him to make [place] aware if he misses any doses in the interim as he would require a TEE. Continue Eliquis 5 mg twice daily.  2.  Secondary Cardiomyopathy - His EF was reduced at 40% by most recent echocardiogram and was felt to be tachycardia mediated in the setting of atrial fibrillation but could also be due to his significant alcohol abuse prior to admission. - He does have 1+ pitting edema on examination today and was provided with an Rx for Lasix 20 mg to take as needed for edema or weight gain. - He remains on Toprol-XL 100 mg daily and while Cardizem CD is not ideal in the setting of his reduced EF, this has been needed for his elevated rates. Once back in normal sinus rhythm, would plan to discontinue Cardizem.  3.  HTN - BP is at 130/60 during today's visit but he was actually hypotensive during his recent admission. He does not have a blood pressure cuff at home and given his variable readings, will continue his current medication regimen for now.  4.  Alcohol Abuse - By review of notes, he was consuming 24-26 beers and a half-gallon of vodka on a daily basis prior to his recent admission. He denies any recurrent alcohol use since hospital discharge and was congratulated on this.   Medication Adjustments/Labs and Tests Ordered: Current medicines are reviewed at length with the patient today.  Concerns regarding medicines are outlined above.  Medication changes, Labs and Tests  ordered today are listed in the Patient Instructions below. Patient Instructions   Medication Instructions:  Your physician has recommended you make the following change in your medication:  Take Lasix 20 mg As needed for swelling   *If you need a refill on your cardiac medications before your next appointment, please call your pharmacy*   Lab Work: NONE   If you have labs (blood work) drawn today and your tests are completely normal, you will receive your results only by: Marland Kitchen MyChart Message (if you have MyChart) OR . A paper copy in the mail If you have any lab test that is abnormal or we need to change your treatment, we will call you to review the results.   Testing/Procedures: NONE    Follow-Up: At [facility], you and your health needs are our priority.  As part of our continuing mission to provide you with exceptional heart care, we have created designated Provider Care Teams.  These Care Teams include your primary Cardiologist (physician) and Advanced Practice Providers (APPs -  Physician Assistants and Nurse Practitioners) who all work together to provide you with the care you need, when you need it.  We recommend signing up for the patient portal called "MyChart".  Sign up information is provided on this After Visit Summary.  MyChart is used to connect with patients for Virtual Visits (Telemedicine).  Patients are able to view lab/test results, encounter notes, upcoming appointments, etc.  Non-urgent messages can be sent to your provider as well.   To learn more about what you can do with MyChart, go to [email or web address].    Your next appointment:   2 month(s)  The format for your next appointment:   In Person  Provider:   Carlyle Dolly, MD or Bernerd Pho, PA-C   Other Instructions Follow up in 3 weeks in nurse visit for an EKG  Thank you for choosing [facility]!  Two Gram Sodium Diet 2000 mg  What is Sodium? Sodium is a mineral  found naturally in many foods. The most significant source of sodium in the diet is table salt, which is about 40% sodium.  Processed, convenience, and preserved foods also contain a large amount of sodium.  The body needs only 500 mg of sodium daily to function,  A normal diet provides more than enough sodium even if you do not use salt.  Why Limit Sodium? A build up of sodium in the body can cause thirst, increased blood pressure, shortness of breath, and water retention.  Decreasing sodium in the diet can reduce edema and risk of heart attack or stroke associated with high blood pressure.  Keep in mind that there are many other factors involved in these health problems.  Heredity, obesity, lack of exercise, cigarette smoking, stress and what you eat all play a role.  General Guidelines:  Do not add salt at the table or in cooking.  One teaspoon of salt contains over 2 grams of sodium.  Read food labels  Avoid processed and convenience foods  Ask your dietitian before eating any foods not dicussed in the menu planning guidelines  Consult your physician if you wish to use a salt substitute or a sodium containing medication such as antacids.  Limit milk and milk products to 16 oz (2 cups) per day.  Shopping Hints:  READ LABELS!! "Dietetic" does not necessarily mean low sodium.  Salt and other sodium ingredients are often added to foods during processing.   Menu Planning Guidelines Food Group Choose More Often Avoid  Beverages (see also the milk group All fruit juices, low-sodium, salt-free vegetables juices, low-sodium carbonated beverages Regular vegetable or tomato juices, commercially softened water used for drinking or cooking  Breads and Cereals Enriched white, wheat, rye and pumpernickel bread, hard rolls and dinner rolls; muffins, cornbread and waffles; most dry cereals, cooked cereal without added salt; unsalted crackers and breadsticks; low sodium or homemade bread crumbs Bread,  rolls and crackers with salted tops; quick breads; instant hot cereals; pancakes; commercial bread stuffing; self-rising flower and biscuit mixes; regular bread crumbs or cracker crumbs  Desserts and Sweets Desserts and sweets mad with mild should be within allowance Instant pudding mixes and cake mixes  Fats Butter or margarine; vegetable oils; unsalted salad dressings, regular salad dressings limited to 1 Tbs; light, sour and heavy cream Regular salad dressings containing bacon fat, bacon bits, and salt pork; snack dips made with instant soup mixes or processed cheese; salted nuts  Fruits Most fresh, frozen and canned fruits Fruits processed with salt or sodium-containing ingredient (some dried fruits are processed with sodium sulfites        Vegetables Fresh, frozen vegetables and low- sodium canned vegetables Regular canned vegetables, sauerkraut, pickled vegetables, and others prepared in brine; frozen vegetables in sauces; vegetables seasoned with ham, bacon or salt pork  Condiments, Sauces, Miscellaneous  Salt substitute with physician's approval; pepper, herbs, spices; vinegar, lemon or lime juice; hot pepper sauce; garlic powder, onion powder, low sodium soy sauce (1 Tbs.); low sodium condiments (ketchup, chili sauce, mustard) in limited amounts (1 tsp.) fresh ground horseradish; unsalted tortilla chips, pretzels, potato chips, popcorn, salsa (1/4 cup) Any seasoning made with salt including garlic salt, celery salt, onion salt, and seasoned salt; sea salt, rock salt, kosher salt; meat tenderizers; monosodium glutamate; mustard, regular soy sauce, barbecue, sauce, chili sauce, teriyaki sauce, steak sauce, Worcestershire sauce, and most flavored vinegars; canned gravy and  mixes; regular condiments; salted snack foods, olives, picles, relish, horseradish sauce, catsup   Food preparation: Try these seasonings Meats:    Pork Sage, onion Serve with applesauce  Chicken Poultry seasoning, thyme,  parsley Serve with cranberry sauce  Lamb Curry powder, rosemary, garlic, thyme Serve with mint sauce or jelly  Veal Marjoram, basil Serve with current jelly, cranberry sauce  Beef Pepper, bay leaf Serve with dry mustard, unsalted chive butter  Fish Bay leaf, dill Serve with unsalted lemon butter, unsalted parsley butter  Vegetables:    Asparagus Lemon juice   Broccoli Lemon juice   Carrots Mustard dressing parsley, mint, nutmeg, glazed with unsalted butter and sugar   Green beans Marjoram, lemon juice, nutmeg,dill seed   Tomatoes Basil, marjoram, onion   Spice /blend for [employer]" 4 tsp ground thyme 1 tsp ground sage 3 tsp ground rosemary 4 tsp ground marjoram   Test your knowledge 1. A product that says "Salt Free" may still contain sodium. True or False 2. Garlic Powder and Hot Pepper Sauce an be used as alternative seasonings.True or False 3. Processed foods have more sodium than fresh foods.  True or False 4. Canned Vegetables have less sodium than froze True or False  WAYS TO DECREASE YOUR SODIUM INTAKE 1. Avoid the use of added salt in cooking and at the table.  Table salt (and other prepared seasonings which contain salt) is probably one of the greatest sources of sodium in the diet.  Unsalted foods can gain flavor from the sweet, sour, and butter taste sensations of herbs and spices.  Instead of using salt for seasoning, try the following seasonings with the foods listed.  Remember: how you use them to enhance natural food flavors is limited only by your creativity... Allspice-Meat, fish, eggs, fruit, peas, red and yellow vegetables Almond Extract-Fruit baked goods Anise Seed-Sweet breads, fruit, carrots, beets, cottage cheese, cookies (tastes like licorice) Basil-Meat, fish, eggs, vegetables, rice, vegetables salads, soups, sauces Bay Leaf-Meat, fish, stews, poultry Burnet-Salad, vegetables (cucumber-like flavor) Caraway Seed-Bread, cookies, cottage cheese, meat, vegetables,  cheese, rice Cardamon-Baked goods, fruit, soups Celery Powder or seed-Salads, salad dressings, sauces, meatloaf, soup, bread.Do not use  celery salt Chervil-Meats, salads, fish, eggs, vegetables, cottage cheese (parsley-like flavor) Chili Power-Meatloaf, chicken cheese, corn, eggplant, egg dishes Chives-Salads cottage cheese, egg dishes, soups, vegetables, sauces Cilantro-Salsa, casseroles Cinnamon-Baked goods, fruit, pork, lamb, chicken, carrots Cloves-Fruit, baked goods, fish, pot roast, green beans, beets, carrots Coriander-Pastry, cookies, meat, salads, cheese (lemon-orange flavor) Cumin-Meatloaf, fish,cheese, eggs, cabbage,fruit pie (caraway flavor) [employer], fruit, eggs, fish, poultry, cottage cheese, vegetables Dill Seed-Meat, cottage cheese, poultry, vegetables, fish, salads, bread Fennel Seed-Bread, cookies, apples, pork, eggs, fish, beets, cabbage, cheese, Licorice-like flavor Garlic-(buds or powder) Salads, meat, poultry, fish, bread, butter, vegetables, potatoes.Do not  use garlic salt Ginger-Fruit, vegetables, baked goods, meat, fish, poultry Horseradish Root-Meet, vegetables, butter Lemon Juice or Extract-Vegetables, fruit, tea, baked goods, fish salads Mace-Baked goods fruit, vegetables, fish, poultry (taste like nutmeg) Maple Extract-Syrups Marjoram-Meat, chicken, fish, vegetables, breads, green salads (taste like Sage) Mint-Tea, lamb, sherbet, vegetables, desserts, carrots, cabbage Mustard, Dry or Seed-Cheese, eggs, meats, vegetables, poultry Nutmeg-Baked goods, fruit, chicken, eggs, vegetables, desserts Onion Powder-Meat, fish, poultry, vegetables, cheese, eggs, bread, rice salads (Do not use   Onion salt) Orange Extract-Desserts, baked goods Oregano-Pasta, eggs, cheese, onions, pork, lamb, fish, chicken, vegetables, green salads Paprika-Meat, fish, poultry, eggs, cheese, vegetables Parsley Flakes-Butter, vegetables, meat fish, poultry, eggs, bread, salads  (certain forms may   Contain sodium Pepper-Meat fish,  poultry, vegetables, eggs Peppermint Extract-Desserts, baked goods Poppy Seed-Eggs, bread, cheese, fruit dressings, baked goods, noodles, vegetables, cottage  [employer], poultry, meat, fish, cauliflower, turnips,eggs bread Saffron-Rice, bread, veal, chicken, fish, eggs Sage-Meat, fish, poultry, onions, eggplant, tomateos, pork, stews Savory-Eggs, salads, poultry, meat, rice, vegetables, soups, pork Tarragon-Meat, poultry, fish, eggs, butter, vegetables (licorice-like flavor)  Thyme-Meat, poultry, fish, eggs, vegetables, (clover-like flavor), sauces, soups Tumeric-Salads, butter, eggs, fish, rice, vegetables (saffron-like flavor) Vanilla Extract-Baked goods, candy Vinegar-Salads, vegetables, meat marinades Walnut Extract-baked goods, candy  2. Choose your Foods Wisely   The following is a list of foods to avoid which are high in sodium:  Meats-Avoid all smoked, canned, salt cured, dried and kosher meat and fish as well as Anchovies   [employer] Rx meats:Bologna, Liverwurst, Pastrami Canned meat or fish  Marinated herring Caviar    Pepperoni Corned Beef   Pizza Dried chipped beef  Salami Frozen breaded fish or meat Salt pork Frankfurters or hot dogs  Sardines Gefilte fish   Sausage Ham (boiled ham, Proscuitto Smoked butt    spiced ham)   Spam      TV Dinners Vegetables Canned vegetables (Regular) Relish Canned mushrooms  Sauerkraut Olives    Tomato juice Pickles  Bakery and Dessert Products Canned puddings  Cream pies Cheesecake   Decorated cakes Cookies  Beverages/Juices Tomato juice, regular  Gatorade   V-8 vegetable juice, regular  Breads and Cereals Biscuit mixes   Salted potato chips, corn chips, pretzels Bread stuffing mixes  Salted crackers and rolls Pancake and waffle mixes Self-rising flour  Seasonings Accent    Meat sauces Barbecue sauce  Meat  tenderizer Catsup    Monosodium glutamate (MSG) Celery salt   Onion salt Chili sauce   Prepared mustard Garlic salt   Salt, seasoned salt, sea salt Gravy mixes   Soy sauce Horseradish   Steak sauce Ketchup   Tartar sauce Lite salt    Teriyaki sauce Marinade mixes   Worcestershire sauce  Others Baking powder   Cocoa and cocoa mixes Baking soda   Commercial casserole mixes Candy-caramels, chocolate  Dehydrated soups    Bars, fudge,nougats  Instant rice and pasta mixes Canned broth or soup  Maraschino cherries Cheese, aged and processed cheese and cheese spreads      Signed, Erma Heritage, PA-C  01/27/2020 5:31 PM    [facility] [address] [place], [place] [address] Phone: [phone] Fax: [phone]

## 2020-02-17 ENCOUNTER — Ambulatory Visit (INDEPENDENT_AMBULATORY_CARE_PROVIDER_SITE_OTHER): Payer: Self-pay

## 2020-02-17 ENCOUNTER — Other Ambulatory Visit: Payer: Self-pay

## 2020-02-17 VITALS — BP 160/94 | HR 97 | Ht 67.0 in | Wt 250.0 lb

## 2020-02-17 DIAGNOSIS — I4891 Unspecified atrial fibrillation: Secondary | ICD-10-CM

## 2020-02-17 MED ORDER — FUROSEMIDE 20 MG PO TABS: 20.0000 mg | ORAL_TABLET | ORAL | 6 refills | Status: DC | PRN

## 2020-02-17 MED ORDER — DIGOXIN 125 MCG PO TABS: 0.1250 mg | ORAL_TABLET | Freq: Every day | ORAL | 6 refills | Status: DC

## 2020-02-17 MED ORDER — APIXABAN 5 MG PO TABS: 5.0000 mg | ORAL_TABLET | Freq: Two times a day (BID) | ORAL | 6 refills | Status: DC

## 2020-02-17 MED ORDER — DILTIAZEM HCL ER COATED BEADS 180 MG PO CP24: 180.0000 mg | ORAL_CAPSULE | Freq: Every day | ORAL | 6 refills | Status: DC

## 2020-02-17 MED ORDER — METOPROLOL SUCCINATE ER 100 MG PO TB24: 100.0000 mg | ORAL_TABLET | Freq: Every day | ORAL | 6 refills | Status: DC

## 2020-02-17 NOTE — Progress Notes (Signed)
Seen in clinic today for EKG. Patient remains in A-fib. To be scheduled for DCCV next week. Patient has not been taking Cardizem 360 mg due to inability to pay. Dose decreased by Randall An, PA-C to 180 mg daily. Samples of Eliqus 5 mg given to patient.

## 2020-02-17 NOTE — Patient Instructions (Addendum)
Medication Instructions:  Your physician recommends that you continue on your current medications as directed. Please refer to the Current Medication list given to you today.  *If you need a refill on your cardiac medications before your next appointment, please call your pharmacy*   Lab Work: None today  If you have labs (blood work) drawn today and your tests are completely normal, you will receive your results only by: Marland Kitchen MyChart Message (if you have MyChart) OR . A paper copy in the mail If you have any lab test that is abnormal or we need to change your treatment, we will call you to review the results.   Testing/Procedures: Your physician has recommended that you have a Cardioversion (DCCV). Electrical Cardioversion uses a jolt of electricity to your heart either through paddles or wired patches attached to your chest. This is a controlled, usually prescheduled, procedure. Defibrillation is done under light anesthesia in the hospital, and you usually go home the day of the procedure. This is done to get your heart back into a normal rhythm. You are not awake for the procedure. Please see the instruction sheet given to you today.  WE WILL CALL YOU WITH DATE WHEN SCHEDULED   Follow-Up: At [facility], you and your health needs are our priority.  As part of our continuing mission to provide you with exceptional heart care, we have created designated Provider Care Teams.  These Care Teams include your primary Cardiologist (physician) and Advanced Practice Providers (APPs -  Physician Assistants and Nurse Practitioners) who all work together to provide you with the care you need, when you need it.  We recommend signing up for the patient portal called "MyChart".  Sign up information is provided on this After Visit Summary.  MyChart is used to connect with patients for Virtual Visits (Telemedicine).  Patients are able to view lab/test results, encounter notes, upcoming appointments, etc.   Non-urgent messages can be sent to your provider as well.   To learn more about what you can do with MyChart, go to [email or web address].    Your next appointment:  3 WEEKS AFTER CARDIOVERSION   The format for your next appointment:   In Person  Provider:   Dina Rich, MD or Omar An, PA-C   Other Instructions Samples given Eliquis 5 mg, 4 boxes with 14 tablets in each, lot [id number], Exp 12/2021

## 2020-02-17 NOTE — Progress Notes (Addendum)
     I spoke to the patient today during his nurse visit for his EKG and he remains in atrial fibrillation. He does not believe he has been taking Cardizem CD and I did recommend resuming this at 180 mg daily given his elevated HR and BP as he continues to experience dyspnea on exertion and palpitations. Remains on Toprol-XL and Digoxin. He reports good compliance with Eliquis and denies missing any recent doses. Says that he has not consumed any alcohol since his recent hospitalization.  Previously discussed with Dr. Wyline Mood who was in agreement for DCCV following 3 weeks of uninterrupted anticoagulation. Reviewed risks and benefits of cardioversion again with the patient and he agrees to proceed. Will ask our schedulers to hopefully get this arranged for next week.   Signed, Ellsworth Lennox, PA-C 02/17/2020, 5:16 PM

## 2020-02-22 ENCOUNTER — Telehealth: Payer: Self-pay

## 2020-02-22 ENCOUNTER — Encounter (HOSPITAL_COMMUNITY)
Admission: RE | Admit: 2020-02-22 | Discharge: 2020-02-22 | Disposition: A | Discharge status: Home / Self Care | Payer: Self-pay | Source: Ambulatory Visit | Attending: Cardiology | Admitting: Cardiology

## 2020-02-22 ENCOUNTER — Other Ambulatory Visit: Payer: Self-pay

## 2020-02-22 ENCOUNTER — Other Ambulatory Visit (HOSPITAL_COMMUNITY)
Admission: RE | Admit: 2020-02-22 | Discharge: 2020-02-22 | Disposition: A | Discharge status: Home / Self Care | Payer: HRSA Program | Source: Ambulatory Visit | Attending: Cardiology | Admitting: Cardiology

## 2020-02-22 ENCOUNTER — Encounter (HOSPITAL_COMMUNITY): Payer: Self-pay

## 2020-02-22 DIAGNOSIS — Z20822 Contact with and (suspected) exposure to covid-19: Secondary | ICD-10-CM | POA: Insufficient documentation

## 2020-02-22 DIAGNOSIS — Z01812 Encounter for preprocedural laboratory examination: Secondary | ICD-10-CM | POA: Diagnosis present

## 2020-02-22 LAB — COMPREHENSIVE METABOLIC PANEL
ALT: 25 U/L (ref 0–44)
AST: 25 U/L (ref 15–41)
Albumin: 3.8 g/dL (ref 3.5–5.0)
Alkaline Phosphatase: 67 U/L (ref 38–126)
Anion gap: 10 (ref 5–15)
BUN: 9 mg/dL (ref 6–20)
CO2: 21 mmol/L — ABNORMAL LOW (ref 22–32)
Calcium: 9.1 mg/dL (ref 8.9–10.3)
Chloride: 105 mmol/L (ref 98–111)
Creatinine, Ser: 0.68 mg/dL (ref 0.61–1.24)
GFR calc Af Amer: >60 mL/min (ref >60)
GFR calc non Af Amer: >60 mL/min (ref >60)
Glucose, Bld: 107 mg/dL — ABNORMAL HIGH (ref 70–99)
Potassium: 3.9 mmol/L (ref 3.5–5.1)
Sodium: 136 mmol/L (ref 135–145)
Total Bilirubin: 1.3 mg/dL — ABNORMAL HIGH (ref 0.3–1.2)
Total Protein: 7 g/dL (ref 6.5–8.1)

## 2020-02-22 NOTE — Patient Instructions (Signed)
Omar Mccarthy  02/22/2020     @PREFPERIOPPHARMACY @   Your procedure is scheduled on 02/24/2020.  Report to 02/26/2020 at 8:10 A.M.  Call this number if you have problems the morning of surgery:  [phone]   Remember:  Do not eat or drink after midnight.   Please do not skip any doses of Eliquis    Take these medicines the morning of surgery with A SIP OF WATER : The office is to call patient at home and let him know which cardiac medication he is to take the am of surgery.     Do not wear jewelry, make-up or nail polish.  Do not wear lotions, powders, or perfumes, or deodorant.  Do not shave 48 hours prior to surgery.  Men may shave face and neck.  Do not bring valuables to the hospital.  [facility] is not responsible for any belongings or valuables.  Contacts, dentures or bridgework may not be worn into surgery.  Leave your suitcase in the car.  After surgery it may be brought to your room.  For patients admitted to the hospital, discharge time will be determined by your treatment team.  Patients discharged the day of surgery will not be allowed to drive home.   Name and phone number of your driver:   family Special instructions: N?A Please read over the following fact sheets that you were given. Care and Recovery After Surgery     Electrical Cardioversion Electrical cardioversion is the delivery of a jolt of electricity to restore a normal rhythm to the heart. A rhythm that is too fast or is not regular keeps the heart from pumping well. In this procedure, sticky patches or metal paddles are placed on the chest to deliver electricity to the heart from a device. This procedure may be done in an emergency if:  There is low or no blood pressure as a result of the heart rhythm.  Normal rhythm must be restored as fast as possible to protect the brain and heart from further damage.  It may save a life. This may also be a scheduled procedure for irregular or fast  heart rhythms that are not immediately life-threatening. Tell a health care provider about:  Any allergies you have.  All medicines you are taking, including vitamins, herbs, eye drops, creams, and over-the-counter medicines.  Any problems you or family members have had with anesthetic medicines.  Any blood disorders you have.  Any surgeries you have had.  Any medical conditions you have.  Whether you are pregnant or may be pregnant. What are the risks? Generally, this is a safe procedure. However, problems may occur, including:  Allergic reactions to medicines.  A blood clot that breaks free and travels to other parts of your body.  The possible return of an abnormal heart rhythm within hours or days after the procedure.  Your heart stopping (cardiac arrest). This is rare. What happens before the procedure? Medicines  Your health care provider may have you start taking: ? Blood-thinning medicines (anticoagulants) so your blood does not clot as easily. ? Medicines to help stabilize your heart rate and rhythm.  Ask your health care provider about: ? Changing or stopping your regular medicines. This is especially important if you are taking diabetes medicines or blood thinners. ? Taking medicines such as aspirin and ibuprofen. These medicines can thin your blood. Do not take these medicines unless your health care provider tells you to take them. ? Taking over-the-counter  medicines, vitamins, herbs, and supplements. General instructions  Follow instructions from your health care provider about eating or drinking restrictions.  Plan to have someone take you home from the hospital or clinic.  If you will be going home right after the procedure, plan to have someone with you for 24 hours.  Ask your health care provider what steps will be taken to help prevent infection. These may include washing your skin with a germ-killing soap. What happens during the procedure?   An IV  will be inserted into one of your veins.  Sticky patches (electrodes) or metal paddles may be placed on your chest.  You will be given a medicine to help you relax (sedative).  An electrical shock will be delivered. The procedure may vary among health care providers and hospitals. What can I expect after the procedure?  Your blood pressure, heart rate, breathing rate, and blood oxygen level will be monitored until you leave the hospital or clinic.  Your heart rhythm will be watched to make sure it does not change.  You may have some redness on the skin where the shocks were given. Follow these instructions at home:  Do not drive for 24 hours if you were given a sedative during your procedure.  Take over-the-counter and prescription medicines only as told by your health care provider.  Ask your health care provider how to check your pulse. Check it often.  Rest for 48 hours after the procedure or as told by your health care provider.  Avoid or limit your caffeine use as told by your health care provider.  Keep all follow-up visits as told by your health care provider. This is important. Contact a health care provider if:  You feel like your heart is beating too quickly or your pulse is not regular.  You have a serious muscle cramp that does not go away. Get help right away if:  You have discomfort in your chest.  You are dizzy or you feel faint.  You have trouble breathing or you are short of breath.  Your speech is slurred.  You have trouble moving an arm or leg on one side of your body.  Your fingers or toes turn cold or blue. Summary  Electrical cardioversion is the delivery of a jolt of electricity to restore a normal rhythm to the heart.  This procedure may be done right away in an emergency or may be a scheduled procedure if the condition is not an emergency.  Generally, this is a safe procedure.  After the procedure, check your pulse often as told by your  health care provider. This information is not intended to replace advice given to you by your health care provider. Make sure you discuss any questions you have with your health care provider. Document Revised: 12/14/2018 Document Reviewed: 12/14/2018 Elsevier Patient Education  2020 [employer].

## 2020-02-22 NOTE — Telephone Encounter (Signed)
Patient returned your call , please give him a call back

## 2020-02-22 NOTE — Telephone Encounter (Signed)
Attempt to reach, voicemail full

## 2020-02-22 NOTE — Telephone Encounter (Signed)
    Yes, should hold Toprol-XL that morning but still take Eliquis with a sip of water.   Thanks,  [place]

## 2020-02-22 NOTE — Telephone Encounter (Signed)
I spoke with patient, he agrees to Hold Toprol the am of cardioversion and to take Eliquis

## 2020-02-22 NOTE — Telephone Encounter (Signed)
Should Mr.Nevils hold Toprol before cardioversion?

## 2020-02-23 LAB — SARS CORONAVIRUS 2 (TAT 6-24 HRS): SARS Coronavirus 2: NEGATIVE

## 2020-02-24 ENCOUNTER — Encounter (HOSPITAL_COMMUNITY)
Admission: RE | Disposition: A | Discharge status: Home / Self Care | Payer: Self-pay | Source: Home / Self Care | Attending: Cardiology

## 2020-02-24 ENCOUNTER — Ambulatory Visit (HOSPITAL_COMMUNITY)
Admission: RE | Admit: 2020-02-24 | Discharge: 2020-02-24 | Disposition: A | Discharge status: Home / Self Care | Payer: Self-pay | Attending: Cardiology | Admitting: Cardiology

## 2020-02-24 ENCOUNTER — Other Ambulatory Visit: Payer: Self-pay | Admitting: Cardiology

## 2020-02-24 ENCOUNTER — Ambulatory Visit (HOSPITAL_COMMUNITY): Payer: Self-pay | Admitting: Anesthesiology

## 2020-02-24 ENCOUNTER — Encounter (HOSPITAL_COMMUNITY): Payer: Self-pay | Admitting: Cardiology

## 2020-02-24 DIAGNOSIS — R000 Tachycardia, unspecified: Secondary | ICD-10-CM | POA: Insufficient documentation

## 2020-02-24 DIAGNOSIS — I110 Hypertensive heart disease with heart failure: Secondary | ICD-10-CM | POA: Insufficient documentation

## 2020-02-24 DIAGNOSIS — F17200 Nicotine dependence, unspecified, uncomplicated: Secondary | ICD-10-CM | POA: Insufficient documentation

## 2020-02-24 DIAGNOSIS — I509 Heart failure, unspecified: Secondary | ICD-10-CM | POA: Insufficient documentation

## 2020-02-24 DIAGNOSIS — I4819 Other persistent atrial fibrillation: Secondary | ICD-10-CM

## 2020-02-24 DIAGNOSIS — K219 Gastro-esophageal reflux disease without esophagitis: Secondary | ICD-10-CM | POA: Insufficient documentation

## 2020-02-24 DIAGNOSIS — Z79899 Other long term (current) drug therapy: Secondary | ICD-10-CM | POA: Insufficient documentation

## 2020-02-24 HISTORY — PX: CARDIOVERSION: SHX1299

## 2020-02-24 SURGERY — CARDIOVERSION
Anesthesia: General

## 2020-02-24 MED ORDER — LACTATED RINGERS IV SOLN
Freq: Once | INTRAVENOUS | Status: AC
  Administered 2020-02-24: 1000 mL via INTRAVENOUS

## 2020-02-24 MED ORDER — LACTATED RINGERS IV SOLN: INTRAVENOUS | Status: DC | PRN

## 2020-02-24 MED ORDER — MIDAZOLAM HCL 2 MG/2ML IJ SOLN
2.0000 mg | Freq: Once | INTRAMUSCULAR | Status: AC
  Administered 2020-02-24: 2 mg via INTRAVENOUS

## 2020-02-24 MED ORDER — PROPOFOL 10 MG/ML IV BOLUS
INTRAVENOUS | Status: AC
  Filled 2020-02-24: qty 40

## 2020-02-24 MED ORDER — MIDAZOLAM HCL 2 MG/2ML IJ SOLN
INTRAMUSCULAR | Status: AC
  Filled 2020-02-24: qty 2

## 2020-02-24 MED ORDER — LIDOCAINE 2% (20 MG/ML) 5 ML SYRINGE
INTRAMUSCULAR | Status: AC
  Filled 2020-02-24: qty 5

## 2020-02-24 MED ORDER — PROPOFOL 10 MG/ML IV BOLUS
INTRAVENOUS | Status: DC | PRN
  Administered 2020-02-24: 60 mg via INTRAVENOUS

## 2020-02-24 MED ORDER — LIDOCAINE 2% (20 MG/ML) 5 ML SYRINGE
INTRAMUSCULAR | Status: DC | PRN
  Administered 2020-02-24: 100 mg via INTRAVENOUS
  Administered 2020-02-24: 20 mg via INTRAVENOUS

## 2020-02-24 MED ORDER — PHENYLEPHRINE 40 MCG/ML (10ML) SYRINGE FOR IV PUSH (FOR BLOOD PRESSURE SUPPORT)
PREFILLED_SYRINGE | INTRAVENOUS | Status: AC
  Filled 2020-02-24: qty 10

## 2020-02-24 NOTE — Transfer of Care (Signed)
Immediate Anesthesia Transfer of Care Note  Patient: Omar Mccarthy  Procedure(s) Performed: CARDIOVERSION (N/A )  Patient Location: PACU  Anesthesia Type:General  Level of Consciousness: awake and patient cooperative  Airway & Oxygen Therapy: Patient Spontanous Breathing  Post-op Assessment: Report given to RN and Post -op Vital signs reviewed and stable  Post vital signs: Reviewed and stable  Last Vitals:  Vitals Value Taken Time  BP    Temp    Pulse    Resp    SpO2      Last Pain:  Vitals:   02/24/20 0845  TempSrc: Oral  PainSc: 0-No pain      Patients Stated Pain Goal: 8 (02/24/20 0845)  Complications: No complications documented.

## 2020-02-24 NOTE — Interval H&P Note (Signed)
History and Physical Interval Note:  02/24/2020 9:36 AM  Omar Mccarthy  has presented today for elective cardioversion for management of persistent atrial fibrillation. He states that he has been compliant with Eliquis. Recent labwork reviewed. Informed consent obtained and signed. He is ready to proceed.  Jonelle Sidle, M.D., F.A.C.C.

## 2020-02-24 NOTE — Progress Notes (Signed)
Electrical Cardioversion Procedure Note Omar Mccarthy [id number] 11/21/1990  Procedure: Electrical Cardioversion Indications: persistent atrial fibrillation  Procedure Details Consent: cardioversion Time Out: Verified patient identification, verified procedure, site/side was marked, verified correct patient position, special equipment/implants available, medications/allergies/relevent history reviewed, required imaging and test results available. Time out 0939.  Patient placed on cardiac monitor, pulse oximetry, supplemental oxygen as necessary.  Sedation given: per anesthesia Pacer pads placed anterior and posterior.  Cardioverted 2 time(s).  Cardioverted at 120 jules at 0940 and 150 jules at 0941.  Evaluation Findings: Post procedure EKG shows: normal sinus rhythmn Complications: none Patient did tolerate procedure well.   Devoria Glassing R 02/24/2020, 10:10 AM

## 2020-02-24 NOTE — Discharge Instructions (Signed)
Monitored Anesthesia Care, Care After These instructions provide you with information about caring for yourself after your procedure. Your health care provider may also give you more specific instructions. Your treatment has been planned according to current medical practices, but problems sometimes occur. Call your health care provider if you have any problems or questions after your procedure. What can I expect after the procedure? After your procedure, you may:  Feel sleepy for several hours.  Feel clumsy and have poor balance for several hours.  Feel forgetful about what happened after the procedure.  Have poor judgment for several hours.  Feel nauseous or vomit.  Have a sore throat if you had a breathing tube during the procedure. Follow these instructions at home: For at least 24 hours after the procedure:      Have a responsible adult stay with you. It is important to have someone help care for you until you are awake and alert.  Rest as needed.  Do not: ? Participate in activities in which you could fall or become injured. ? Drive. ? Use heavy machinery. ? Drink alcohol. ? Take sleeping pills or medicines that cause drowsiness. ? Make important decisions or sign legal documents. ? Take care of children on your own. Eating and drinking  Follow the diet that is recommended by your health care provider.  If you vomit, drink water, juice, or soup when you can drink without vomiting.  Make sure you have little or no nausea before eating solid foods. General instructions  Take over-the-counter and prescription medicines only as told by your health care provider.  If you have sleep apnea, surgery and certain medicines can increase your risk for breathing problems. Follow instructions from your health care provider about wearing your sleep device: ? Anytime you are sleeping, including during daytime naps. ? While taking prescription pain medicines, sleeping medicines,  or medicines that make you drowsy.  If you smoke, do not smoke without supervision.  Keep all follow-up visits as told by your health care provider. This is important. Contact a health care provider if:  You keep feeling nauseous or you keep vomiting.  You feel light-headed.  You develop a rash.  You have a fever. Get help right away if:  You have trouble breathing. Summary  For several hours after your procedure, you may feel sleepy and have poor judgment.  Have a responsible adult stay with you for at least 24 hours or until you are awake and alert. This information is not intended to replace advice given to you by your health care provider. Make sure you discuss any questions you have with your health care provider. Document Revised: 08/11/2017 Document Reviewed: 09/03/2015 Elsevier Patient Education  2020 Elsevier Inc[employer]   Electrical Cardioversion Electrical cardioversion is the delivery of a jolt of electricity to restore a normal rhythm to the heart. A rhythm that is too fast or is not regular keeps the heart from pumping well. In this procedure, sticky patches or metal paddles are placed on the chest to deliver electricity to the heart from a device. This procedure may be done in an emergency if:  There is low or no blood pressure as a result of the heart rhythm.  Normal rhythm must be restored as fast as possible to protect the brain and heart from further damage.  It may save a life. This may also be a scheduled procedure for irregular or fast heart rhythms that are not immediately life-threatening. Tell a health care provider   about:  Any allergies you have.  All medicines you are taking, including vitamins, herbs, eye drops, creams, and over-the-counter medicines.  Any problems you or family members have had with anesthetic medicines.  Any blood disorders you have.  Any surgeries you have had.  Any medical conditions you have.  Whether you are pregnant or  may be pregnant. What are the risks? Generally, this is a safe procedure. However, problems may occur, including:  Allergic reactions to medicines.  A blood clot that breaks free and travels to other parts of your body.  The possible return of an abnormal heart rhythm within hours or days after the procedure.  Your heart stopping (cardiac arrest). This is rare. What happens before the procedure? Medicines  Your health care provider may have you start taking: ? Blood-thinning medicines (anticoagulants) so your blood does not clot as easily. ? Medicines to help stabilize your heart rate and rhythm.  Ask your health care provider about: ? Changing or stopping your regular medicines. This is especially important if you are taking diabetes medicines or blood thinners. ? Taking medicines such as aspirin and ibuprofen. These medicines can thin your blood. Do not take these medicines unless your health care provider tells you to take them. ? Taking over-the-counter medicines, vitamins, herbs, and supplements. General instructions  Follow instructions from your health care provider about eating or drinking restrictions.  Plan to have someone take you home from the hospital or clinic.  If you will be going home right after the procedure, plan to have someone with you for 24 hours.  Ask your health care provider what steps will be taken to help prevent infection. These may include washing your skin with a germ-killing soap. What happens during the procedure?   An IV will be inserted into one of your veins.  Sticky patches (electrodes) or metal paddles may be placed on your chest.  You will be given a medicine to help you relax (sedative).  An electrical shock will be delivered. The procedure may vary among health care providers and hospitals. What can I expect after the procedure?  Your blood pressure, heart rate, breathing rate, and blood oxygen level will be monitored until you  leave the hospital or clinic.  Your heart rhythm will be watched to make sure it does not change.  You may have some redness on the skin where the shocks were given. Follow these instructions at home:  Do not drive for 24 hours if you were given a sedative during your procedure.  Take over-the-counter and prescription medicines only as told by your health care provider.  Ask your health care provider how to check your pulse. Check it often.  Rest for 48 hours after the procedure or as told by your health care provider.  Avoid or limit your caffeine use as told by your health care provider.  Keep all follow-up visits as told by your health care provider. This is important. Contact a health care provider if:  You feel like your heart is beating too quickly or your pulse is not regular.  You have a serious muscle cramp that does not go away. Get help right away if:  You have discomfort in your chest.  You are dizzy or you feel faint.  You have trouble breathing or you are short of breath.  Your speech is slurred.  You have trouble moving an arm or leg on one side of your body.  Your fingers or toes turn cold or   blue. Summary  Electrical cardioversion is the delivery of a jolt of electricity to restore a normal rhythm to the heart.  This procedure may be done right away in an emergency or may be a scheduled procedure if the condition is not an emergency.  Generally, this is a safe procedure.  After the procedure, check your pulse often as told by your health care provider. This information is not intended to replace advice given to you by your health care provider. Make sure you discuss any questions you have with your health care provider. Document Revised: 12/14/2018 Document Reviewed: 12/14/2018 Elsevier Patient Education  2020 Elsevier Inc.  

## 2020-02-24 NOTE — Anesthesia Preprocedure Evaluation (Signed)
Anesthesia Evaluation  Patient identified by MRN, date of birth, ID band Patient awake    Reviewed: Allergy & Precautions, NPO status , Patient's Chart, lab work & pertinent test results, reviewed documented beta blocker date and time   History of Anesthesia Complications Negative for: history of anesthetic complications  Airway Mallampati: III  TM Distance: >3 FB Neck ROM: Full    Dental  (+) Teeth Intact, Dental Advisory Given   Pulmonary Current Smoker and Patient abstained from smoking.,    Pulmonary exam normal breath sounds clear to auscultation       Cardiovascular Exercise Tolerance: Good hypertension, Pt. on medications and Pt. on home beta blockers + dysrhythmias Atrial Fibrillation  Rhythm:Irregular Rate:Tachycardia     Neuro/Psych PSYCHIATRIC DISORDERS    GI/Hepatic GERD  Medicated and Controlled,(+)     substance abuse  alcohol use,   Endo/Other  negative endocrine ROS  Renal/GU negative Renal ROS  negative genitourinary   Musculoskeletal negative musculoskeletal ROS (+)   Abdominal   Peds  Hematology negative hematology ROS (+)   Anesthesia Other Findings   Reproductive/Obstetrics negative OB ROS                             Anesthesia Physical Anesthesia Plan  ASA: III  Anesthesia Plan: General   Post-op Pain Management:    Induction: Intravenous  PONV Risk Score and Plan: TIVA  Airway Management Planned: Nasal Cannula, Natural Airway and Simple Face Mask  Additional Equipment:   Intra-op Plan:   Post-operative Plan: Extubation in OR  Informed Consent: I have reviewed the patients History and Physical, chart, labs and discussed the procedure including the risks, benefits and alternatives for the proposed anesthesia with the patient or authorized representative who has indicated his/her understanding and acceptance.     Dental advisory given  Plan  Discussed with: CRNA and Surgeon  Anesthesia Plan Comments:         Anesthesia Quick Evaluation

## 2020-02-24 NOTE — CV Procedure (Signed)
Elective direct-current cardioversion  Indication: Persistent atrial fibrillation  Description of procedure: After informed consent was obtained the patient was taken to the PACU where a timeout was performed.  Pads were placed in standard fashion anteriorly and posteriorly, connected to a biphasic defibrillator.  Deep sedation was achieved per the Anesthesia service with use of propofol, please refer to their records for dose administration.  He had also received preprocedure Versed.  With sandbag on the anterior chest pad, a synchronized shock at 120 J was delivered with resulting persistent atrial fibrillation.  A second synchronized shock at 150 J was then delivered with successful restoration of sinus rhythm.  There were no immediate complications and patient remained hemodynamically stable throughout.  Sinus rhythm confirmed by ECG.  Jonelle Sidle, M.D., F.A.C.C.

## 2020-02-24 NOTE — Anesthesia Postprocedure Evaluation (Signed)
Anesthesia Post Note  Patient: Omar Mccarthy  Procedure(s) Performed: CARDIOVERSION (N/A )  Patient location during evaluation: PACU Anesthesia Type: General Level of consciousness: awake and alert and oriented Pain management: pain level controlled Vital Signs Assessment: post-procedure vital signs reviewed and stable Respiratory status: spontaneous breathing, nonlabored ventilation and respiratory function stable Cardiovascular status: blood pressure returned to baseline Postop Assessment: no apparent nausea or vomiting Anesthetic complications: no   No complications documented.   Last Vitals:  Vitals:   02/24/20 0845  BP: (!) 138/99  Pulse: 97  Resp: 20  Temp: 37.3 C  SpO2: 96%    Last Pain:  Vitals:   02/24/20 0845  TempSrc: Oral  PainSc: 0-No pain                 Julian Reil

## 2020-02-29 ENCOUNTER — Encounter (HOSPITAL_COMMUNITY): Payer: Self-pay | Admitting: Cardiology

## 2020-03-10 ENCOUNTER — Telehealth: Payer: Self-pay | Admitting: Student

## 2020-03-14 NOTE — Telephone Encounter (Signed)
Called to notify pt that samples have been placed at front desk for pick-up. No answer. Voicemail is full.

## 2020-03-15 NOTE — Telephone Encounter (Signed)
Called patient and made him aware that samples have been placed at the front desk for pick up. He verbalized understanding.

## 2020-03-21 ENCOUNTER — Ambulatory Visit: Payer: Self-pay | Admitting: Cardiology

## 2020-03-29 ENCOUNTER — Telehealth: Payer: Self-pay | Admitting: Student

## 2020-03-29 NOTE — Telephone Encounter (Signed)
Patient's mother called and said he was running out of eliquis and needed samples

## 2020-03-31 NOTE — Telephone Encounter (Signed)
Spoke with pt and he will stop by for samples of Eliquis. 2 Boxes of 5 mg Eliquis placed a front desk for pick up. Lot # [id number] Exp: Aug 2023. Pt assistance information given.

## 2020-04-04 ENCOUNTER — Ambulatory Visit (INDEPENDENT_AMBULATORY_CARE_PROVIDER_SITE_OTHER): Payer: Self-pay | Admitting: Cardiology

## 2020-04-04 ENCOUNTER — Encounter: Payer: Self-pay | Admitting: *Deleted

## 2020-04-04 ENCOUNTER — Encounter: Payer: Self-pay | Admitting: Cardiology

## 2020-04-04 ENCOUNTER — Other Ambulatory Visit: Payer: Self-pay

## 2020-04-04 VITALS — BP 144/86 | HR 67 | Ht 68.0 in | Wt 260.0 lb

## 2020-04-04 DIAGNOSIS — Z7901 Long term (current) use of anticoagulants: Secondary | ICD-10-CM | POA: Insufficient documentation

## 2020-04-04 DIAGNOSIS — I4819 Other persistent atrial fibrillation: Secondary | ICD-10-CM

## 2020-04-04 DIAGNOSIS — R079 Chest pain, unspecified: Secondary | ICD-10-CM

## 2020-04-04 NOTE — Assessment & Plan Note (Signed)
Pt c/o exertional chest "burning"

## 2020-04-04 NOTE — Progress Notes (Signed)
Cardiology Office Note:    Date:  04/04/2020   ID:  Omar Mccarthy, DOB June 20, 1990, MRN [id number]  PCP:  Health, [facility]  Cardiologist:  Dina Rich, MD  Electrophysiologist:  None   Referring MD: Health, Omar Mccarthy*   CC: chest pain-"burning"  History of Present Illness:    Omar Mccarthy is a 29 y.o. male with a hx of heavy ETOH abuse.  He presented to [facility] ED on 01/11/2020 for dyspnea and a 30 pound weight gain over the previous few months. He did report consuming 24-26 beers on a daily basis and consuming 1/2 gallon of vodka on a daily basis. Was found to be in atrial fibrillation with RVR upon admission and was started on IV Cardizem and IV Lopressor. He ultimately required IV Amiodarone as well given soft BP. CTA chest was negative for PE. He did have a reduced EF of 40% by his echocardiogram with global hypokinesis. RV function was moderately reduced as well. He was ultimately going to undergo a TEE guided cardioversion on 01/21/2020 but he wished to postpone the procedure at that time and rate control was pursued. He was discharged on Eliquis 5 mg twice daily, Digoxin 0.125 mg daily, Cardizem CD 360 mg daily and Toprol-XL 100 mg daily. His hospitalization was complicated by DT's and required intubation during admission. Also diagnosed with alcoholic hepatitis with elevated LFT's and outpatient GI follow-up was recommended.   On 02/24/2020 he underwent OP DCCV to NSR.  He is in the office today for follow up.  He reports he has stopped drinking ETOH.  He is not noted rapid heart rate or palpitations.  He did complain of exertional chest burning.  He says this comes on when he is "hustling to do something".  If he walks slowly at a steady pace he does not have it.  Eating or position do not seem to make any difference.  He denies any radiation to his jaw or arms or associated nausea vomiting or diaphoresis.  There is no family history of coronary  disease.  Past Medical History:  Diagnosis Date  . Atrial fibrillation (HCC)    a. diagnosed in 12/2019  . Hypertension     Past Surgical History:  Procedure Laterality Date  . CARDIOVERSION N/A 02/24/2020   Procedure: CARDIOVERSION;  Surgeon: Jonelle Sidle, MD;  [facility];  Service: Cardiovascular;  Laterality: N/A;    Current Medications: Current Meds  Medication Sig  . apixaban (ELIQUIS) 5 MG TABS tablet Take 1 tablet (5 mg total) by mouth 2 (two) times daily.  . digoxin (LANOXIN) 0.125 MG tablet Take 1 tablet (0.125 mg total) by mouth daily.  . furosemide (LASIX) 20 MG tablet Take 1 tablet (20 mg total) by mouth as needed for edema.  . metoprolol succinate (TOPROL-XL) 100 MG 24 hr tablet Take 1 tablet (100 mg total) by mouth daily. Take with or immediately following a meal.  . Multiple Vitamin (MULTIVITAMIN WITH MINERALS) TABS tablet Take 1 tablet by mouth daily.  . pantoprazole (PROTONIX) 40 MG tablet Take 1 tablet (40 mg total) by mouth daily.     Allergies:   Patient has no known allergies.   Social History   Socioeconomic History  . Marital status: Married    Spouse name: Not on file  . Number of children: Not on file  . Years of education: Not on file  . Highest education level: Not on file  Occupational History  . Not  on file  Tobacco Use  . Smoking status: Current Every Day Smoker    Packs/day: 1.00  . Smokeless tobacco: Never Used  Vaping Use  . Vaping Use: Never used  Substance and Sexual Activity  . Alcohol use: Not Currently    Comment: stopped 01/12/20  . Drug use: Never  . Sexual activity: Not on file  Other Topics Concern  . Not on file  Social History Narrative  . Not on file   Social Determinants of Health   Financial Resource Strain:   . Difficulty of Paying Living Expenses: Not on file  Food Insecurity:   . Worried About Programme researcher, broadcasting/film/video in the Last Year: Not on file  . Ran Out of Food in the Last Year: Not on file   Transportation Needs:   . Lack of Transportation (Medical): Not on file  . Lack of Transportation (Non-Medical): Not on file  Physical Activity:   . Days of Exercise per Week: Not on file  . Minutes of Exercise per Session: Not on file  Stress:   . Feeling of Stress : Not on file  Social Connections:   . Frequency of Communication with Friends and Family: Not on file  . Frequency of Social Gatherings with Friends and Family: Not on file  . Attends Religious Services: Not on file  . Active Member of Clubs or Organizations: Not on file  . Attends Banker Meetings: Not on file  . Marital Status: Not on file     Family History: The patient's family history includes Colon cancer in his mother; High Cholesterol in his father and mother; High blood pressure in his father and mother.  ROS:   Please see the history of present illness.     All other systems reviewed and are negative.  EKGs/Labs/Other Studies Reviewed:    The following studies were reviewed today: Echo Aug 2021- IMPRESSIONS    1. Left ventricular ejection fraction, by estimation, is approximately  40%. The left ventricle has moderately decreased function. The left  ventricle demonstrates global hypokinesis with some regionla variation.  There is moderate left ventricular  hypertrophy. Left ventricular diastolic parameters are indeterminate in  the setting of atrial fibrillation. No obvious LV mural thrombus.  2. Right ventricular systolic function is moderately reduced. The right  ventricular size is normal. There is normal pulmonary artery systolic  pressure. The estimated right ventricular systolic pressure is 28.8 mmHg.  3. The mitral valve is grossly normal. Mild mitral valve regurgitation.  4. The aortic valve is tricuspid. Aortic valve regurgitation is not  visualized.  5. The inferior vena cava is dilated in size with <50% respiratory  variability, suggesting right atrial pressure of 15  mmHg.   EKG:  EKG is ordered today.  The ekg ordered today demonstrates NSR- HR 67  Recent Labs: 01/11/2020: B Natriuretic Peptide 279.0; TSH 3.890 01/19/2020: Magnesium 1.9 01/21/2020: Hemoglobin 13.1; Platelets 277 02/22/2020: ALT 25; BUN 9; Creatinine, Ser 0.68; Potassium 3.9; Sodium 136  Recent Lipid Panel    Component Value Date/Time   TRIG 149 01/16/2020 0523    Physical Exam:    VS:  BP (!) 144/86   Pulse 67   Ht 5\' 8"  (1.727 m)   Wt 260 lb (117.9 kg)   SpO2 100%   BMI 39.53 kg/m     Wt Readings from Last 3 Encounters:  04/04/20 260 lb (117.9 kg)  02/22/20 250 lb (113.4 kg)  02/17/20 250 lb (113.4 kg)  GEN:  Overweight caucasian male, well developed in no acute distress HEENT: Normal NECK: No JVD; No carotid bruits LYMPHATICS: No lymphadenopathy CARDIAC: RRR, no murmurs, rubs, gallops RESPIRATORY:  Clear to auscultation without rales, wheezing or rhonchi  ABDOMEN: Soft, non-tender, non-distended MUSCULOSKELETAL:  No edema; No deformity  SKIN: Warm and dry NEUROLOGIC:  Alert and oriented x 3 PSYCHIATRIC:  Normal affect   ASSESSMENT:    Chest pain of uncertain etiology Pt c/o exertional chest "burning"  Persistent atrial fibrillation (HCC) In the setting of heavy ETOH- s/p OP DCCV 02/24/2020  Anticoagulated Reviewed with dr Diona Browner- continue for now, patient is at high risk for recurrent PAF.   PLAN:    Lexiscan Myoview- continue anticoagulation.    Medication Adjustments/Labs and Tests Ordered: Current medicines are reviewed at length with the patient today.  Concerns regarding medicines are outlined above.  Orders Placed This Encounter  Procedures  . NM Myocar Multi W/Spect W/Wall Motion / EF  . EKG 12-Lead   No orders of the defined types were placed in this encounter.   Patient Instructions  Medication Instructions:  Your physician recommends that you continue on your current medications as directed. Please refer to the Current Medication  list given to you today.  *If you need a refill on your cardiac medications before your next appointment, please call your pharmacy*   Lab Work: NONE   If you have labs (blood work) drawn today and your tests are completely normal, you will receive your results only by: Marland Kitchen MyChart Message (if you have MyChart) OR . A paper copy in the mail If you have any lab test that is abnormal or we need to change your treatment, we will call you to review the results.   Testing/Procedures: Your physician has requested that you have a lexiscan myoview. For further information please visit [email or web address] Please follow instruction sheet, as given.  Follow-Up: At [facility], you and your health needs are our priority.  As part of our continuing mission to provide you with exceptional heart care, we have created designated Provider Care Teams.  These Care Teams include your primary Cardiologist (physician) and Advanced Practice Providers (APPs -  Physician Assistants and Nurse Practitioners) who all work together to provide you with the care you need, when you need it.  We recommend signing up for the patient portal called "MyChart".  Sign up information is provided on this After Visit Summary.  MyChart is used to connect with patients for Virtual Visits (Telemedicine).  Patients are able to view lab/test results, encounter notes, upcoming appointments, etc.  Non-urgent messages can be sent to your provider as well.   To learn more about what you can do with MyChart, go to [email or web address].    Your next appointment:   2 Weeks   The format for your next appointment:   In Person  Provider:   Randall An, PA-C or Jacolyn Reedy, PA-C   Other Instructions Thank you for choosing [facility]!       Signed, Corine Shelter, PA-C  04/04/2020 3:35 PM    [facility]

## 2020-04-04 NOTE — Patient Instructions (Signed)
Medication Instructions:  Your physician recommends that you continue on your current medications as directed. Please refer to the Current Medication list given to you today.  *If you need a refill on your cardiac medications before your next appointment, please call your pharmacy*   Lab Work: NONE   If you have labs (blood work) drawn today and your tests are completely normal, you will receive your results only by: Marland Kitchen MyChart Message (if you have MyChart) OR . A paper copy in the mail If you have any lab test that is abnormal or we need to change your treatment, we will call you to review the results.   Testing/Procedures: Your physician has requested that you have a lexiscan myoview. For further information please visit [email or web address] Please follow instruction sheet, as given.  Follow-Up: At [facility], you and your health needs are our priority.  As part of our continuing mission to provide you with exceptional heart care, we have created designated Provider Care Teams.  These Care Teams include your primary Cardiologist (physician) and Advanced Practice Providers (APPs -  Physician Assistants and Nurse Practitioners) who all work together to provide you with the care you need, when you need it.  We recommend signing up for the patient portal called "MyChart".  Sign up information is provided on this After Visit Summary.  MyChart is used to connect with patients for Virtual Visits (Telemedicine).  Patients are able to view lab/test results, encounter notes, upcoming appointments, etc.  Non-urgent messages can be sent to your provider as well.   To learn more about what you can do with MyChart, go to [email or web address].    Your next appointment:   2 Weeks   The format for your next appointment:   In Person  Provider:   Randall An, PA-C or Jacolyn Reedy, PA-C   Other Instructions Thank you for choosing [facility]!

## 2020-04-04 NOTE — Assessment & Plan Note (Signed)
In the setting of heavy ETOH- s/p OP DCCV 02/24/2020

## 2020-04-04 NOTE — Assessment & Plan Note (Signed)
Reviewed with dr Diona Browner- continue for now, patient is at high risk for recurrent PAF.

## 2020-04-05 ENCOUNTER — Ambulatory Visit: Payer: Self-pay | Admitting: Student

## 2020-04-12 ENCOUNTER — Encounter (HOSPITAL_COMMUNITY): Payer: Medicaid Other

## 2020-04-12 ENCOUNTER — Ambulatory Visit (HOSPITAL_COMMUNITY): Payer: Self-pay

## 2020-04-17 ENCOUNTER — Telehealth: Payer: Self-pay | Admitting: *Deleted

## 2020-04-17 NOTE — Telephone Encounter (Signed)
Called to notify pt that patient assistance Eliquis has arrived in office. No answer. Unable to leave msg d/t mailbox being full.

## 2020-04-17 NOTE — Telephone Encounter (Signed)
Spoke with pt. Notified that he was approved for Eliquis pt assistance and that medication was in office to be picked up.

## 2020-04-25 NOTE — Progress Notes (Deleted)
Cardiology Clinic Note   Patient Name: Omar Mccarthy Date of Encounter: 04/25/2020  Primary Care Provider:  [facility], [facility] Primary Cardiologist:  Dina Rich, MD  Patient Profile    Omar Mccarthy. Arthurs 29 year old male presents the clinic today for follow-up evaluation of his persistent atrial fibrillation and for review of his stress test.  Past Medical History    Past Medical History:  Diagnosis Date  . Atrial fibrillation (HCC)    a. diagnosed in 12/2019  . Hypertension    Past Surgical History:  Procedure Laterality Date  . CARDIOVERSION N/A 02/24/2020   Procedure: CARDIOVERSION;  Surgeon: Jonelle Sidle, MD;  Location[facility];  Service: Cardiovascular;  Laterality: N/A;    Allergies  No Known Allergies  History of Present Illness    [name] has a PMH of persistent atrial fibrillation on Eliquis, respiratory failure, fluid volume overload, alcohol abuse, lower extremity edema, chest pain of uncertain etiology.  He presented to [facility] emergency department 01/11/2020 with reports of dyspnea and a 30 pound weight increase over the previous few months.  He reported consuming 24-26 cans of beer on a daily basis and consuming half a gallon of vodka on a daily basis.  On presentation to the ED is found to be in atrial fibrillation with RVR.  He was started on IV Cardizem and IV Lopressor.  He required IV amiodarone due to hypotension.  His CT chest was negative for PE.  His EF was reduced to 40% and global hypokinesis was noted on this echocardiogram.  He was also noted to have reduced RV function.  A TEE DCCV was planned for 01/21/2020.  However he wished to proceed on the procedure and rate control was pursued.  He was started on Eliquis, Cardizem, and metoprolol.  His hospitalization was also complicated by delirium tremors and he required intubation.  He was diagnosed with alcoholic hepatitis, his LFTs were elevated and he was recommended for GI  outpatient follow-up.  On 02/24/2020 he underwent DCCV which was successful in restoring sinus rhythm.  He presented to the clinic on 04/04/2020 seen by Corine Shelter, PA-C.  He reported alcohol cessation.  He denied palpitations or increased heart rate.  He did report some exertional chest burning with increased physical activity.  He indicated that he did not have the sensation with decreased exertion type physical activities.  He denied radiation of the pain to his jaw or arms and denied nausea vomiting and diaphoresis.  He has no family history of CAD.  He was scheduled for nuclear stress test 04/12/2020 but did not show for the appointment.  He presents the clinic today for follow-up evaluation states***  *** denies chest pain, shortness of breath, lower extremity edema, fatigue, palpitations, melena, hematuria, hemoptysis, diaphoresis, weakness, presyncope, syncope, orthopnea, and PND.   Home Medications    Prior to Admission medications   Medication Sig Start Date End Date Taking? Authorizing Provider  apixaban (ELIQUIS) 5 MG TABS tablet Take 1 tablet (5 mg total) by mouth 2 (two) times daily. 02/17/20   Strader, Lennart Pall, PA-C  digoxin (LANOXIN) 0.125 MG tablet Take 1 tablet (0.125 mg total) by mouth daily. 02/17/20   Strader, Lennart Pall, PA-C  furosemide (LASIX) 20 MG tablet Take 1 tablet (20 mg total) by mouth as needed for edema. 02/17/20 05/17/20  Strader, Lennart Pall, PA-C  metoprolol succinate (TOPROL-XL) 100 MG 24 hr tablet Take 1 tablet (100 mg total) by mouth daily. Take with or  immediately following a meal. 02/17/20   Strader, [place] M, PA-C  Multiple Vitamin (MULTIVITAMIN WITH MINERALS) TABS tablet Take 1 tablet by mouth daily. 01/23/20   Shon Hale, MD  pantoprazole (PROTONIX) 40 MG tablet Take 1 tablet (40 mg total) by mouth daily. 01/23/20   Shon Hale, MD  thiamine 100 MG tablet Take 1 tablet (100 mg total) by mouth daily. Patient not taking: Reported on 01/27/2020  01/23/20   Shon Hale, MD    Family History    Family History  Problem Relation Age of Onset  . High blood pressure Mother   . High Cholesterol Mother   . Colon cancer Mother   . High blood pressure Father   . High Cholesterol Father    He indicated that his mother is alive. He indicated that his father is alive. He indicated that his sister is alive. He indicated that both of his brothers are alive.  Social History    Social History   Socioeconomic History  . Marital status: Married    Spouse name: Not on file  . Number of children: Not on file  . Years of education: Not on file  . Highest education level: Not on file  Occupational History  . Not on file  Tobacco Use  . Smoking status: Current Every Day Smoker    Packs/day: 1.00  . Smokeless tobacco: Never Used  Vaping Use  . Vaping Use: Never used  Substance and Sexual Activity  . Alcohol use: Not Currently    Comment: stopped 01/12/20  . Drug use: Never  . Sexual activity: Not on file  Other Topics Concern  . Not on file  Social History Narrative  . Not on file   Social Determinants of Health   Financial Resource Strain:   . Difficulty of Paying Living Expenses: Not on file  Food Insecurity:   . Worried About Programme researcher, broadcasting/film/video in the Last Year: Not on file  . Ran Out of Food in the Last Year: Not on file  Transportation Needs:   . Lack of Transportation (Medical): Not on file  . Lack of Transportation (Non-Medical): Not on file  Physical Activity:   . Days of Exercise per Week: Not on file  . Minutes of Exercise per Session: Not on file  Stress:   . Feeling of Stress : Not on file  Social Connections:   . Frequency of Communication with Friends and Family: Not on file  . Frequency of Social Gatherings with Friends and Family: Not on file  . Attends Religious Services: Not on file  . Active Member of Clubs or Organizations: Not on file  . Attends Banker Meetings: Not on file  .  Marital Status: Not on file  Intimate Partner Violence:   . Fear of Current or Ex-Partner: Not on file  . Emotionally Abused: Not on file  . Physically Abused: Not on file  . Sexually Abused: Not on file     Review of Systems    General:  No chills, fever, night sweats or weight changes.  Cardiovascular:  No chest pain, dyspnea on exertion, edema, orthopnea, palpitations, paroxysmal nocturnal dyspnea. Dermatological: No rash, lesions/masses Respiratory: No cough, dyspnea Urologic: No hematuria, dysuria Abdominal:   No nausea, vomiting, diarrhea, bright red blood per rectum, melena, or hematemesis Neurologic:  No visual changes, wkns, changes in mental status. All other systems reviewed and are otherwise negative except as noted above.  Physical Exam    VS:  There were no vitals taken for this visit. , BMI There is no height or weight on file to calculate BMI. GEN: Well nourished, well developed, in no acute distress. HEENT: normal. Neck: Supple, no JVD, carotid bruits, or masses. Cardiac: RRR, no murmurs, rubs, or gallops. No clubbing, cyanosis, edema.  Radials/DP/PT 2+ and equal bilaterally.  Respiratory:  Respirations regular and unlabored, clear to auscultation bilaterally. GI: Soft, nontender, nondistended, BS + x 4. MS: no deformity or atrophy. Skin: warm and dry, no rash. Neuro:  Strength and sensation are intact. Psych: Normal affect.  Accessory Clinical Findings    Recent Labs: 01/11/2020: B Natriuretic Peptide 279.0; TSH 3.890 01/19/2020: Magnesium 1.9 01/21/2020: Hemoglobin 13.1; Platelets 277 02/22/2020: ALT 25; BUN 9; Creatinine, Ser 0.68; Potassium 3.9; Sodium 136   Recent Lipid Panel    Component Value Date/Time   TRIG 149 01/16/2020 0523    ECG personally reviewed by me today- *** - No acute changes  EKG 04/05/2020 Normal sinus rhythm 67 bpm  Echocardiogram 01/13/2020 IMPRESSIONS    1. Left ventricular ejection fraction, by estimation, is  approximately  40%. The left ventricle has moderately decreased function. The left  ventricle demonstrates global hypokinesis with some regionla variation.  There is moderate left ventricular  hypertrophy. Left ventricular diastolic parameters are indeterminate in  the setting of atrial fibrillation. No obvious LV mural thrombus.  2. Right ventricular systolic function is moderately reduced. The right  ventricular size is normal. There is normal pulmonary artery systolic  pressure. The estimated right ventricular systolic pressure is 28.8 mmHg.  3. The mitral valve is grossly normal. Mild mitral valve regurgitation.  4. The aortic valve is tricuspid. Aortic valve regurgitation is not  visualized.  5. The inferior vena cava is dilated in size with <50% respiratory  variability, suggesting right atrial pressure of 15 mmHg.  Assessment & Plan   1.  Persistent atrial fibrillation-heart rate today***.  Underwent DCCV 02/24/2020 which was successful in converting him to NSR.  Continues to abstain from EtOH Continue apixaban, digoxin, metoprolol Heart healthy low-sodium diet-salty 6 given Increase physical activity as tolerated  Chest pain-no chest pain today.  Continues to have exertional type chest pain which he describes as burning with increased physical activity.  Was a no-show for 04/12/2020 stress test. Continue metoprolol Heart healthy low-sodium diet-salty 6 given Increase physical activity as tolerated Reorder nuclear stress test  Cardiomyopathy-no increased activity intolerance or DOE.  EF noted to be 40% with global hypokinesis in the setting of EtOH abuse and atrial fibrillation. Continue metoprolol, digoxin, furosemide Heart healthy low-sodium diet-salty 6 given Increase physical activity as tolerated  Essential hypertension-BP today***. Continue metoprolol Heart healthy low-sodium diet-salty 6 given Increase physical activity as tolerated   Disposition: Follow-up  after  stress test.   Thomasene Ripple. Pius Byrom NP-C    04/25/2020, 6:36 AM [facility] [address] Office [phone] Fax [phone]  Notice: This dictation was prepared with Dragon dictation along with smaller phrase technology. Any transcriptional errors that result from this process are unintentional and may not be corrected upon review.

## 2020-04-26 ENCOUNTER — Ambulatory Visit: Payer: Self-pay | Admitting: General Practice

## 2020-06-08 ENCOUNTER — Telehealth: Payer: Self-pay | Admitting: Student

## 2020-06-08 NOTE — Telephone Encounter (Signed)
Patient needs new RX for pantoprazole 40mg  1 a day. [facility] [address].

## 2020-06-09 MED ORDER — PANTOPRAZOLE SODIUM 40 MG PO TBEC: 40.0000 mg | DELAYED_RELEASE_TABLET | Freq: Every day | ORAL | 6 refills | Status: DC

## 2020-06-09 NOTE — Telephone Encounter (Signed)
Refilled per request.

## 2020-07-20 ENCOUNTER — Telehealth: Payer: Self-pay | Admitting: Student

## 2020-07-20 NOTE — Telephone Encounter (Signed)
Patient calling the office for samples of medication:   1.  What medication and dosage are you requesting samples for?  ELIQUIS   2.  Are you currently out of this medication?

## 2020-07-20 NOTE — Telephone Encounter (Signed)
Eliquis Samples 5 mg (#14) Lot#: [id number] Exp 4/24

## 2020-07-31 ENCOUNTER — Telehealth: Payer: Self-pay

## 2020-07-31 NOTE — Telephone Encounter (Signed)
[facility]- PAF Eliquis arrived. Tried calling patient with no answer and no way to leave a vm. Will try again.

## 2020-08-01 NOTE — Telephone Encounter (Signed)
Patient contacted our office. Patient will be coming to pick up his medication.

## 2020-08-01 NOTE — Telephone Encounter (Signed)
Contacted patients alternative contact person to get a message to patient, who has changed his number to call out office.

## 2020-09-12 ENCOUNTER — Ambulatory Visit: Payer: Self-pay | Admitting: Cardiology

## 2020-09-12 NOTE — Progress Notes (Deleted)
Clinical Summary Omar Mccarthy is a 30 y.o.male  1. Afib - new diagnosis during 12/2019 admission in setting of EtoH withdrawal and sepsis - CHADS2Vasc score is 2, initially did not start anticoag due to chronic EtoH withdrawal and thrombocytopenia - rated controlled during that admission - persistent afib at f/u, had 02/24/20 succesful cardioversion   2. Chronic systolic HF - diagnosed during 12/2019 admission with EtOH withdrawal, sepsis, afib with RVR  3. Chest pain - reported symptoms during 03/2020 clinic visit - did not go for lexiscan Past Medical History:  Diagnosis Date  . Atrial fibrillation (HCC)    a. diagnosed in 12/2019  . Hypertension      No Known Allergies   Current Outpatient Medications  Medication Sig Dispense Refill  . apixaban (ELIQUIS) 5 MG TABS tablet Take 1 tablet (5 mg total) by mouth 2 (two) times daily. 60 tablet 6  . digoxin (LANOXIN) 0.125 MG tablet Take 1 tablet (0.125 mg total) by mouth daily. 30 tablet 6  . furosemide (LASIX) 20 MG tablet Take 1 tablet (20 mg total) by mouth as needed for edema. 30 tablet 6  . metoprolol succinate (TOPROL-XL) 100 MG 24 hr tablet Take 1 tablet (100 mg total) by mouth daily. Take with or immediately following a meal. 30 tablet 6  . Multiple Vitamin (MULTIVITAMIN WITH MINERALS) TABS tablet Take 1 tablet by mouth daily. 30 tablet 2  . pantoprazole (PROTONIX) 40 MG tablet Take 1 tablet (40 mg total) by mouth daily. 30 tablet 6  . thiamine 100 MG tablet Take 1 tablet (100 mg total) by mouth daily. (Patient not taking: Reported on 01/27/2020) 30 tablet 1   No current facility-administered medications for this visit.     Past Surgical History:  Procedure Laterality Date  . CARDIOVERSION N/A 02/24/2020   Procedure: CARDIOVERSION;  Surgeon: Jonelle Sidle, MD;  [facility];  Service: Cardiovascular;  Laterality: N/A;     No Known Allergies    Family History  Problem Relation Age of Onset  .  High blood pressure Mother   . High Cholesterol Mother   . Colon cancer Mother   . High blood pressure Father   . High Cholesterol Father      Social History Omar Mccarthy reports that he has been smoking. He has been smoking about 1.00 pack per day. He has never used smokeless tobacco. Omar Mccarthy reports previous alcohol use.   Review of Systems CONSTITUTIONAL: No weight loss, fever, chills, weakness or fatigue.  HEENT: Eyes: No visual loss, blurred vision, double vision or yellow sclerae.No hearing loss, sneezing, congestion, runny nose or sore throat.  SKIN: No rash or itching.  CARDIOVASCULAR:  RESPIRATORY: No shortness of breath, cough or sputum.  GASTROINTESTINAL: No anorexia, nausea, vomiting or diarrhea. No abdominal pain or blood.  GENITOURINARY: No burning on urination, no polyuria NEUROLOGICAL: No headache, dizziness, syncope, paralysis, ataxia, numbness or tingling in the extremities. No change in bowel or bladder control.  MUSCULOSKELETAL: No muscle, back pain, joint pain or stiffness.  LYMPHATICS: No enlarged nodes. No history of splenectomy.  PSYCHIATRIC: No history of depression or anxiety.  ENDOCRINOLOGIC: No reports of sweating, cold or heat intolerance. No polyuria or polydipsia.  Marland Kitchen   Physical Examination There were no vitals filed for this visit. There were no vitals filed for this visit.  Gen: resting comfortably, no acute distress HEENT: no scleral icterus, pupils equal round and reactive, no palptable cervical adenopathy,  CV Resp: Clear  to auscultation bilaterally GI: abdomen is soft, non-tender, non-distended, normal bowel sounds, no hepatosplenomegaly MSK: extremities are warm, no edema.  Skin: warm, no rash Neuro:  no focal deficits Psych: appropriate affect   Diagnostic Studies     Assessment and Plan        Antoine Poche, M.D., F.A.C.C.

## 2020-09-25 ENCOUNTER — Other Ambulatory Visit: Payer: Self-pay | Admitting: Student

## 2020-10-16 ENCOUNTER — Telehealth: Payer: Self-pay

## 2020-10-16 NOTE — Telephone Encounter (Signed)
Attempt to reach patient, voice mail full. Patients free  BMS Eliquis from [facility]MS was mailed to office. Locked at front desk.

## 2020-10-24 ENCOUNTER — Other Ambulatory Visit: Payer: Self-pay | Admitting: Student

## 2020-10-27 ENCOUNTER — Telehealth: Payer: Self-pay | Admitting: Cardiology

## 2020-10-27 NOTE — Telephone Encounter (Signed)
Medication refill request denied for Metoprolol Succinate and Digoxin. Pt needs follow up appointment and Digoxin labwork.

## 2020-10-27 NOTE — Telephone Encounter (Signed)
New message     *STAT* If patient is at the pharmacy, call can be transferred to refill team.   1. Which medications need to be refilled? (please list name of each medication and dose if known) digoxin (LANOXIN) 0.125 MG tablet metoprolol succinate (TOPROL-XL) 100 MG 24 hr tablet  2. Which pharmacy/location (including street and city if local pharmacy) is medication to be sent to? [facility] [address]   3. Do they need a 30 day or 90 day supply?  30

## 2020-10-30 ENCOUNTER — Other Ambulatory Visit: Payer: Self-pay | Admitting: Student

## 2020-10-31 NOTE — Telephone Encounter (Signed)
*  STAT* If patient is at the pharmacy, call can be transferred to refill team.   1. Which medications need to be refilled? (please list name of each medication and dose if known)   digoxin (LANOXIN) 0.125 MG tablet,  metoprolol succinate (TOPROL-XL) 100 MG 24 hr tablet & pantoprazole (PROTONIX) 40 MG tablet  2. Which pharmacy/location (including street and city if local pharmacy) is medication to be sent to? [facility]  3. Do they need a 30 day or 90 day supply? He is out, needs enough to get him to Appt 6/24 in [place]

## 2020-11-17 ENCOUNTER — Ambulatory Visit: Payer: Self-pay | Admitting: Cardiology

## 2020-11-17 NOTE — Progress Notes (Deleted)
     Clinical Summary Mr. Mckim is a 30 y.o.male  Afib -newly diagnosed during 12/2019 admission with EtOH withdrawal and sepsis - had DCCV 02/14/2020   ?stop dig  2.  - prior echo LVEF 40%  Past Medical History:  Diagnosis Date   Atrial fibrillation (HCC)    a. diagnosed in 12/2019   Hypertension      No Known Allergies   Current Outpatient Medications  Medication Sig Dispense Refill   apixaban (ELIQUIS) 5 MG TABS tablet Take 1 tablet (5 mg total) by mouth 2 (two) times daily. 60 tablet 6   digoxin (LANOXIN) 0.125 MG tablet TAKE ONE TABLET BY MOUTH DAILY 30 tablet 0   furosemide (LASIX) 20 MG tablet Take 1 tablet (20 mg total) by mouth as needed for edema. 30 tablet 6   metoprolol succinate (TOPROL-XL) 100 MG 24 hr tablet TAKE ONE TABLET BY MOUTH DAILY WITH OR IMMEDIATELY FOLLOWING A MEAL 30 tablet 0   Multiple Vitamin (MULTIVITAMIN WITH MINERALS) TABS tablet Take 1 tablet by mouth daily. 30 tablet 2   pantoprazole (PROTONIX) 40 MG tablet Take 1 tablet (40 mg total) by mouth daily. 30 tablet 6   thiamine 100 MG tablet Take 1 tablet (100 mg total) by mouth daily. (Patient not taking: Reported on 01/27/2020) 30 tablet 1   No current facility-administered medications for this visit.     Past Surgical History:  Procedure Laterality Date   CARDIOVERSION N/A 02/24/2020   Procedure: CARDIOVERSION;  Surgeon: Jonelle Sidle, MD;  [facility];  Service: Cardiovascular;  Laterality: N/A;     No Known Allergies    Family History  Problem Relation Age of Onset   High blood pressure Mother    High Cholesterol Mother    Colon cancer Mother    High blood pressure Father    High Cholesterol Father      Social History Mr. Bagheri reports that he has been smoking. He has been smoking an average of 1.00 packs per day. He has never used smokeless tobacco. Mr. Okuda reports previous alcohol use.   Review of Systems CONSTITUTIONAL: No weight loss, fever,  chills, weakness or fatigue.  HEENT: Eyes: No visual loss, blurred vision, double vision or yellow sclerae.No hearing loss, sneezing, congestion, runny nose or sore throat.  SKIN: No rash or itching.  CARDIOVASCULAR:  RESPIRATORY: No shortness of breath, cough or sputum.  GASTROINTESTINAL: No anorexia, nausea, vomiting or diarrhea. No abdominal pain or blood.  GENITOURINARY: No burning on urination, no polyuria NEUROLOGICAL: No headache, dizziness, syncope, paralysis, ataxia, numbness or tingling in the extremities. No change in bowel or bladder control.  MUSCULOSKELETAL: No muscle, back pain, joint pain or stiffness.  LYMPHATICS: No enlarged nodes. No history of splenectomy.  PSYCHIATRIC: No history of depression or anxiety.  ENDOCRINOLOGIC: No reports of sweating, cold or heat intolerance. No polyuria or polydipsia.  Marland Kitchen   Physical Examination There were no vitals filed for this visit. There were no vitals filed for this visit.  Gen: resting comfortably, no acute distress HEENT: no scleral icterus, pupils equal round and reactive, no palptable cervical adenopathy,  CV Resp: Clear to auscultation bilaterally GI: abdomen is soft, non-tender, non-distended, normal bowel sounds, no hepatosplenomegaly MSK: extremities are warm, no edema.  Skin: warm, no rash Neuro:  no focal deficits Psych: appropriate affect   Diagnostic Studies     Assessment and Plan        Antoine Poche, M.D., F.A.C.C.

## 2020-11-20 NOTE — Progress Notes (Deleted)
Cardiology Office Note  Date: 11/20/2020   ID: [name], DOB Jul 03, 1990, MRN [id number]  PCP:  [facility]  Cardiologist:  Dina Rich, MD Electrophysiologist:  None   Chief Complaint: Chest pain / SOB for last 2 days  History of Present Illness: Omar Mccarthy is a 30 y.o. male with a history of atrial fibrillation, alcohol abuse, cardiomyopathy, chest pain/burning.  Underwent outpatient DCCV to normal sinus rhythm on 02/24/2020.  He reported that he had stopped drinking.  He reported no palpitations or rapid heart rate.  Complained of exertional chest burning.  Eating more position changes did not seem to aggravate the chest pain.  He denied any radiation or associated symptoms.  He last saw Omar Mccarthy, [place] 04/04/2020.  Due to chest pain/chest burning a Lexiscan Myoview was ordered and patient was continuing anticoagulation as well as digoxin 0.125 mg,  Furosemide 20 mg as needed, Toprol-XL 100 mg daily. Apparently patient never followed through on having stress test.    He presents today for complaint of chest pain and shortness of breath over the last 2 days.    Past Medical History:  Diagnosis Date   Atrial fibrillation [facility])    a. diagnosed in 12/2019   Hypertension     Past Surgical History:  Procedure Laterality Date   CARDIOVERSION N/A 02/24/2020   Procedure: CARDIOVERSION;  Surgeon: Jonelle Sidle, MD;  [facility];  Service: Cardiovascular;  Laterality: N/A;    Current Outpatient Medications  Medication Sig Dispense Refill   apixaban (ELIQUIS) 5 MG TABS tablet Take 1 tablet (5 mg total) by mouth 2 (two) times daily. 60 tablet 6   digoxin (LANOXIN) 0.125 MG tablet TAKE ONE TABLET BY MOUTH DAILY 30 tablet 0   furosemide (LASIX) 20 MG tablet Take 1 tablet (20 mg total) by mouth as needed for edema. 30 tablet 6   metoprolol succinate (TOPROL-XL) 100 MG 24 hr tablet TAKE ONE TABLET BY MOUTH DAILY WITH OR IMMEDIATELY  FOLLOWING A MEAL 30 tablet 0   Multiple Vitamin (MULTIVITAMIN WITH MINERALS) TABS tablet Take 1 tablet by mouth daily. 30 tablet 2   pantoprazole (PROTONIX) 40 MG tablet Take 1 tablet (40 mg total) by mouth daily. 30 tablet 6   thiamine 100 MG tablet Take 1 tablet (100 mg total) by mouth daily. (Patient not taking: Reported on 01/27/2020) 30 tablet 1   No current facility-administered medications for this visit.   Allergies:  Patient has no known allergies.   Social History: The patient  reports that he has been smoking. He has been smoking an average of 1.00 packs per day. He has never used smokeless tobacco. He reports previous alcohol use. He reports that he does not use drugs.   Family History: The patient's family history includes Colon cancer in his mother; High Cholesterol in his father and mother; High blood pressure in his father and mother.   ROS:  Please see the history of present illness. Otherwise, complete review of systems is positive for none.  All other systems are reviewed and negative.   Physical Exam: VS:  There were no vitals taken for this visit., BMI There is no height or weight on file to calculate BMI.  Wt Readings from Last 3 Encounters:  04/04/20 260 lb (117.9 kg)  02/22/20 250 lb (113.4 kg)  02/17/20 250 lb (113.4 kg)    General: Patient appears comfortable at rest. HEENT: Conjunctiva and lids normal, oropharynx clear with moist  mucosa. Neck: Supple, no elevated JVP or carotid bruits, no thyromegaly. Lungs: Clear to auscultation, nonlabored breathing at rest. Cardiac: Regular rate and rhythm, no S3 or significant systolic murmur, no pericardial rub. Abdomen: Soft, nontender, no hepatomegaly, bowel sounds present, no guarding or rebound. Extremities: No pitting edema, distal pulses 2+. Skin: Warm and dry. Musculoskeletal: No kyphosis. Neuropsychiatric: Alert and oriented x3, affect grossly appropriate.  ECG:  {EKG/Telemetry Strips  Reviewed:[id number]}  Recent Labwork: 01/11/2020: B Natriuretic Peptide 279.0; TSH 3.890 01/19/2020: Magnesium 1.9 01/21/2020: Hemoglobin 13.1; Platelets 277 02/22/2020: ALT 25; AST 25; BUN 9; Creatinine, Ser 0.68; Potassium 3.9; Sodium 136     Component Value Date/Time   TRIG 149 01/16/2020 0523    Other Studies Reviewed Today:   Echo Aug 2021 IMPRESSIONS   1. Left ventricular ejection fraction, by estimation, is approximately  40%. The left ventricle has moderately decreased function. The left  ventricle demonstrates global hypokinesis with some regionla variation.  There is moderate left ventricular  hypertrophy. Left ventricular diastolic parameters are indeterminate in  the setting of atrial fibrillation. No obvious LV mural thrombus.   2. Right ventricular systolic function is moderately reduced. The right  ventricular size is normal. There is normal pulmonary artery systolic  pressure. The estimated right ventricular systolic pressure is 28.8 mmHg.   3. The mitral valve is grossly normal. Mild mitral valve regurgitation.   4. The aortic valve is tricuspid. Aortic valve regurgitation is not  visualized.   5. The inferior vena cava is dilated in size with <50% respiratory  variability, suggesting right atrial pressure of 15 mmHg.  Assessment and Plan:  1. Chest pain of uncertain etiology   2. Shortness of breath   3. Atrial fibrillation, unspecified type (HCC)   4. Secondary cardiomyopathy (HCC)   5. History of ETOH abuse   6. Tobacco abuse      Medication Adjustments/Labs and Tests Ordered: Current medicines are reviewed at length with the patient today.  Concerns regarding medicines are outlined above.   Disposition: Follow-up with ***  Signed, Rennis Harding, NP 11/20/2020 9:37 PM    [facility] [address] [place], [place], [place] [address] Phone: [phone]; Fax: [phone]

## 2020-11-21 ENCOUNTER — Ambulatory Visit: Payer: Medicaid Other | Admitting: Family Medicine

## 2020-11-23 ENCOUNTER — Encounter: Payer: Self-pay | Admitting: Family Medicine

## 2020-11-30 ENCOUNTER — Telehealth: Payer: Self-pay | Admitting: Cardiology

## 2020-11-30 NOTE — Telephone Encounter (Signed)
Pt is [facility] to [facility] to [facility] [place] - on [facility], [address] [place], [place], [place] [address]  [phone]  Please send Rx's there

## 2020-11-30 NOTE — Telephone Encounter (Signed)
Patient has not been seen since 11/21. He needs office visit and digoxin level berfore he can have meds refilled. He was a no show to 6/19, 6/24, 6/28 apt's    I will have front office reach out to him and make another apt. I will then give him enough refills until that apt.

## 2020-12-06 ENCOUNTER — Telehealth: Payer: Self-pay | Admitting: Cardiology

## 2020-12-06 ENCOUNTER — Telehealth: Payer: Self-pay

## 2020-12-06 MED ORDER — METOPROLOL SUCCINATE ER 100 MG PO TB24: ORAL_TABLET | ORAL | 0 refills | Status: DC

## 2020-12-06 MED ORDER — DIGOXIN 125 MCG PO TABS: 125.0000 ug | ORAL_TABLET | Freq: Every day | ORAL | 0 refills | Status: DC

## 2020-12-06 NOTE — Telephone Encounter (Signed)
New message      *STAT* If patient is at the pharmacy, call can be transferred to refill team.   1. Which medications need to be refilled? (please list name of each medication and dose if known) metoprolol succinate (TOPROL-XL) 100 MG 24 hr tablet  digoxin (LANOXIN) 0.125 MG tablet  2. Which pharmacy/location (including street and city if local pharmacy) is medication to be sent to? [facility] [place]   3. Do they need a 30 day or 90 day supply? 30

## 2020-12-06 NOTE — Telephone Encounter (Signed)
Pt made appt for medication refill. Pt voiced understanding of keeping appt for further refills.

## 2020-12-06 NOTE — Telephone Encounter (Signed)
Contacted pt and made aware that pt needs to be seen in office for a follow up visit before we can refill his medication.

## 2020-12-15 ENCOUNTER — Ambulatory Visit: Payer: Self-pay | Admitting: Student

## 2021-01-16 NOTE — Progress Notes (Deleted)
Cardiology Office Note    Date:  01/16/2021   ID:  Omar, Mccarthy 1990/11/24, MRN [id number]  PCP:  [facility]  Cardiologist: Dina Rich, MD    No chief complaint on file.   History of Present Illness:    Omar Mccarthy is a 30 y.o. male with past medical history of persistent atrial fibrillation (s/p DCCV in 01/2020), HFrEF (EF 40% by echo in 12/2019), HTN and alcohol abuse who presents to the office today for overdue follow-up.  He was last examined by Omar Shelter, PA-C in 03/2020 after undergoing DCCV for atrial fibrillation. Denied any recent chest pain or palpitations at that time and was continued on his current medications. A Lexiscan Myoview was recommended for ischemic evaluation but never performed. He has since no-showed for multiple office visits in the interim.  - CBC, BMET, Dig Level  Past Medical History:  Diagnosis Date   Atrial fibrillation (HCC)    a. diagnosed in 12/2019   Hypertension     Past Surgical History:  Procedure Laterality Date   CARDIOVERSION N/A 02/24/2020   Procedure: CARDIOVERSION;  Surgeon: Jonelle Sidle, MD;  [facility];  Service: Cardiovascular;  Laterality: N/A;    Current Medications: Outpatient Medications Prior to Visit  Medication Sig Dispense Refill   apixaban (ELIQUIS) 5 MG TABS tablet Take 1 tablet (5 mg total) by mouth 2 (two) times daily. 60 tablet 6   digoxin (LANOXIN) 0.125 MG tablet Take 1 tablet (125 mcg total) by mouth daily. 60 tablet 0   furosemide (LASIX) 20 MG tablet Take 1 tablet (20 mg total) by mouth as needed for edema. 30 tablet 6   metoprolol succinate (TOPROL-XL) 100 MG 24 hr tablet TAKE ONE TABLET BY MOUTH DAILY WITH OR IMMEDIATELY FOLLOWING A MEAL 60 tablet 0   Multiple Vitamin (MULTIVITAMIN WITH MINERALS) TABS tablet Take 1 tablet by mouth daily. 30 tablet 2   pantoprazole (PROTONIX) 40 MG tablet Take 1 tablet (40 mg total) by mouth daily. 30 tablet 6    thiamine 100 MG tablet Take 1 tablet (100 mg total) by mouth daily. (Patient not taking: Reported on 01/27/2020) 30 tablet 1   No facility-administered medications prior to visit.     Allergies:   Patient has no known allergies.   Social History   Socioeconomic History   Marital status: Married    Spouse name: Not on file   Number of children: Not on file   Years of education: Not on file   Highest education level: Not on file  Occupational History   Not on file  Tobacco Use   Smoking status: Every Day    Packs/day: 1.00    Types: Cigarettes   Smokeless tobacco: Never  Vaping Use   Vaping Use: Never used  Substance and Sexual Activity   Alcohol use: Not Currently    Comment: stopped 01/12/20   Drug use: Never   Sexual activity: Not on file  Other Topics Concern   Not on file  Social History Narrative   Not on file   Social Determinants of Health   Financial Resource Strain: Not on file  Food Insecurity: Not on file  Transportation Needs: Not on file  Physical Activity: Not on file  Stress: Not on file  Social Connections: Not on file     Family History:  The patient's ***family history includes Colon cancer in his mother; High Cholesterol in his father and mother; High blood pressure  in his father and mother.   Review of Systems:    Please see the history of present illness.     All other systems reviewed and are otherwise negative except as noted above.   Physical Exam:    VS:  There were no vitals taken for this visit.   General: Well developed, well nourished,male appearing in no acute distress. Head: Normocephalic, atraumatic. Neck: No carotid bruits. JVD not elevated.  Lungs: Respirations regular and unlabored, without wheezes or rales.  Heart: ***Regular rate and rhythm. No S3 or S4.  No murmur, no rubs, or gallops appreciated. Abdomen: Appears non-distended. No obvious abdominal masses. Msk:  Strength and tone appear normal for age. No obvious joint  deformities or effusions. Extremities: No clubbing or cyanosis. No edema.  Distal pedal pulses are 2+ bilaterally. Neuro: Alert and oriented X 3. Moves all extremities spontaneously. No focal deficits noted. Psych:  Responds to questions appropriately with a normal affect. Skin: No rashes or lesions noted  Wt Readings from Last 3 Encounters:  04/04/20 260 lb (117.9 kg)  02/22/20 250 lb (113.4 kg)  02/17/20 250 lb (113.4 kg)        Studies/Labs Reviewed:   EKG:  EKG is*** ordered today.  The ekg ordered today demonstrates ***  Recent Labs: 01/19/2020: Magnesium 1.9 01/21/2020: Hemoglobin 13.1; Platelets 277 02/22/2020: ALT 25; BUN 9; Creatinine, Ser 0.68; Potassium 3.9; Sodium 136   Lipid Panel    Component Value Date/Time   TRIG 149 01/16/2020 0523    Additional studies/ records that were reviewed today include:   Echocardiogram: 12/2019 IMPRESSIONS     1. Left ventricular ejection fraction, by estimation, is approximately  40%. The left ventricle has moderately decreased function. The left  ventricle demonstrates global hypokinesis with some regionla variation.  There is moderate left ventricular  hypertrophy. Left ventricular diastolic parameters are indeterminate in  the setting of atrial fibrillation. No obvious LV mural thrombus.   2. Right ventricular systolic function is moderately reduced. The right  ventricular size is normal. There is normal pulmonary artery systolic  pressure. The estimated right ventricular systolic pressure is 28.8 mmHg.   3. The mitral valve is grossly normal. Mild mitral valve regurgitation.   4. The aortic valve is tricuspid. Aortic valve regurgitation is not  visualized.   5. The inferior vena cava is dilated in size with <50% respiratory  variability, suggesting right atrial pressure of 15 mmHg.   Assessment:    No diagnosis found.   Plan:   In order of problems listed above:  ***    Shared Decision Making/Informed  Consent:   {Are you ordering a CV Procedure (e.g. stress test, cath, DCCV, TEE, etc)?   Press F2        :[id number]}    Medication Adjustments/Labs and Tests Ordered: Current medicines are reviewed at length with the patient today.  Concerns regarding medicines are outlined above.  Medication changes, Labs and Tests ordered today are listed in the Patient Instructions below. There are no Patient Instructions on file for this visit.   Signed, Omar Lennox, PA-C  01/16/2021 11:29 AM    [facility] [address] [place], [place] [address] Phone: [phone] Fax: [phone]

## 2021-01-17 ENCOUNTER — Ambulatory Visit: Payer: Medicaid Other | Admitting: Student

## 2021-01-25 ENCOUNTER — Other Ambulatory Visit: Payer: Self-pay | Admitting: Student

## 2021-01-31 NOTE — Progress Notes (Deleted)
Cardiology Office Note  Date: 01/31/2021   ID: [name], DOB 04-Jun-1990, MRN [id number]  PCP:  [facility], [facility]  Cardiologist:  Dina Rich, MD Electrophysiologist:  None   Chief Complaint:    History of Present Illness: Omar Mccarthy is a 30 y.o. male with a history of persistent atrial fibrillation, chest pain of uncertain etiology, unspecified type, on anticoagulation, HTN, history of heavy EtOH abuse.  Previous presentation to [facility] ED on 01/11/2020 for dyspnea, 30 pound weight gain, heavy alcohol consumption.  Found to be in atrial fibrillation with RVR started on IV Cardizem and IV Lopressor.  Eventually requiring IV amiodarone.  CTA chest was negative.  PE.  Echocardiogram was EF 40% focal hypokinesis, RV function moderately reduced.  He was to undergo TEE guided DCCV on 01/21/2020 but wished to postpone.  Rate control strategy was pursued instead.  He was discharged on Eliquis milligrams p.o. twice daily, digoxin 0.125 mg daily, Cardizem 360 mg daily, Toprol-XL 100 mg daily.  Underwent DCCV to normal sinus rhythm on 02/24/2020.  He followed up with Corine Shelter, PA.  On 04/04/2020.  He had stopped drinking.  He did complaining of exertional chest burning coming occurring when hustling to do something.   Needs medication refills.  Missed last 3 appointments  Past Medical History:  Diagnosis Date   Atrial fibrillation [facility])    a. diagnosed in 12/2019   Hypertension     Past Surgical History:  Procedure Laterality Date   CARDIOVERSION N/A 02/24/2020   Procedure: CARDIOVERSION;  Surgeon: Jonelle Sidle, MD;  [facility]: [facility];  Service: Cardiovascular;  Laterality: N/A;    Current Outpatient Medications  Medication Sig Dispense Refill   apixaban (ELIQUIS) 5 MG TABS tablet Take 1 tablet (5 mg total) by mouth 2 (two) times daily. 60 tablet 6   digoxin (LANOXIN) 0.125 MG tablet Take 1 tablet (125 mcg total) by mouth daily. 60  tablet 0   furosemide (LASIX) 20 MG tablet Take 1 tablet (20 mg total) by mouth as needed for edema. 30 tablet 6   metoprolol succinate (TOPROL-XL) 100 MG 24 hr tablet TAKE ONE TABLET BY MOUTH DAILY WITH OR IMMEDIATELY FOLLOWING A MEAL 60 tablet 0   Multiple Vitamin (MULTIVITAMIN WITH MINERALS) TABS tablet Take 1 tablet by mouth daily. 30 tablet 2   pantoprazole (PROTONIX) 40 MG tablet Take 1 tablet (40 mg total) by mouth daily. Pt needs to keep upcoming appt in Sept for further refills 30 tablet 0   thiamine 100 MG tablet Take 1 tablet (100 mg total) by mouth daily. (Patient not taking: Reported on 01/27/2020) 30 tablet 1   No current facility-administered medications for this visit.   Allergies:  Patient has no known allergies.   Social History: The patient  reports that he has been smoking. He has been smoking an average of 1 pack per day. He has never used smokeless tobacco. He reports that he does not currently use alcohol. He reports that he does not use drugs.   Family History: The patient's family history includes Colon cancer in his mother; High Cholesterol in his father and mother; High blood pressure in his father and mother.   ROS:  Please see the history of present illness. Otherwise, complete review of systems is positive for none.  All other systems are reviewed and negative.   Physical Exam: VS:  There were no vitals taken for this visit., BMI There is no height or weight  on file to calculate BMI.  Wt Readings from Last 3 Encounters:  04/04/20 260 lb (117.9 kg)  02/22/20 250 lb (113.4 kg)  02/17/20 250 lb (113.4 kg)    General: Patient appears comfortable at rest. HEENT: Conjunctiva and lids normal, oropharynx clear with moist mucosa. Neck: Supple, no elevated JVP or carotid bruits, no thyromegaly. Lungs: Clear to auscultation, nonlabored breathing at rest. Cardiac: Regular rate and rhythm, no S3 or significant systolic murmur, no pericardial rub. Abdomen: Soft, nontender,  no hepatomegaly, bowel sounds present, no guarding or rebound. Extremities: No pitting edema, distal pulses 2+. Skin: Warm and dry. Musculoskeletal: No kyphosis. Neuropsychiatric: Alert and oriented x3, affect grossly appropriate.  ECG:  {EKG/Telemetry Strips [phone]}  Recent Labwork: 02/22/2020: ALT 25; AST 25; BUN 9; Creatinine, Ser 0.68; Potassium 3.9; Sodium 136     Component Value Date/Time   TRIG 149 01/16/2020 0523    Other Studies Reviewed Today:  Echo Aug 2021- IMPRESSIONS     1. Left ventricular ejection fraction, by estimation, is approximately  40%. The left ventricle has moderately decreased function. The left  ventricle demonstrates global hypokinesis with some regionla variation.  There is moderate left ventricular  hypertrophy. Left ventricular diastolic parameters are indeterminate in  the setting of atrial fibrillation. No obvious LV mural thrombus.   2. Right ventricular systolic function is moderately reduced. The right  ventricular size is normal. There is normal pulmonary artery systolic  pressure. The estimated right ventricular systolic pressure is 28.8 mmHg.   3. The mitral valve is grossly normal. Mild mitral valve regurgitation.   4. The aortic valve is tricuspid. Aortic valve regurgitation is not  visualized.   5. The inferior vena cava is dilated in size with <50% respiratory  variability, suggesting right atrial pressure of 15 mmHg.    Assessment and Plan:  1. Chest pain of uncertain etiology   2. Persistent atrial fibrillation (HCC)   3. Anticoagulated      Medication Adjustments/Labs and Tests Ordered: Current medicines are reviewed at length with the patient today.  Concerns regarding medicines are outlined above.   Disposition: Follow-up with ***  Signed, Rennis Harding, NP 01/31/2021 5:18 PM    [facility] at [facility] [address] [place], [place], [place] [address] Phone: [phone]; Fax: [phone]

## 2021-02-01 ENCOUNTER — Other Ambulatory Visit: Payer: Self-pay | Admitting: Student

## 2021-02-01 ENCOUNTER — Ambulatory Visit: Payer: Medicaid Other | Admitting: Family Medicine

## 2021-02-01 DIAGNOSIS — Z7901 Long term (current) use of anticoagulants: Secondary | ICD-10-CM

## 2021-02-01 DIAGNOSIS — R079 Chest pain, unspecified: Secondary | ICD-10-CM

## 2021-02-01 DIAGNOSIS — I4819 Other persistent atrial fibrillation: Secondary | ICD-10-CM

## 2021-02-10 ENCOUNTER — Other Ambulatory Visit: Payer: Self-pay | Admitting: Student

## 2021-02-21 ENCOUNTER — Telehealth: Payer: Self-pay | Admitting: *Deleted

## 2021-02-21 NOTE — Telephone Encounter (Signed)
Called to notify patient of the need to renew [employer] patient assistance application. And to notify that he has medication in the office. No answer. Left msg on voicemail to call back.

## 2021-03-26 ENCOUNTER — Telehealth: Payer: Self-pay | Admitting: Cardiology

## 2021-03-26 MED ORDER — DIGOXIN 125 MCG PO TABS: 125.0000 ug | ORAL_TABLET | Freq: Every day | ORAL | 0 refills | Status: DC

## 2021-03-26 MED ORDER — METOPROLOL SUCCINATE ER 100 MG PO TB24: ORAL_TABLET | ORAL | 0 refills | Status: DC

## 2021-03-26 NOTE — Telephone Encounter (Signed)
 *  STAT* If patient is at the pharmacy, call can be transferred to refill team.   1. Which medications need to be refilled? (please list name of each medication and dose if known)   metoprolol succinate (TOPROL-XL) 100 MG 24 hr tablet digoxin (LANOXIN) 0.125 MG tablet  2. Which pharmacy/location (including street and city if local pharmacy) is medication to be sent to?  [facility] [id number] - [place], [place] - [address]  3. Do they need a 30 day or 90 day supply? 60 days  Patient has appt on 04/06/21 with Nena Polio

## 2021-03-26 NOTE — Telephone Encounter (Signed)
Pt notified that he needs to keep appt for further refills. Pt voiced understanding.

## 2021-04-05 NOTE — Progress Notes (Signed)
Cardiology Office Note  Date: 04/06/2021   ID: Omar Mccarthy, DOB [birth year], MRN [id number]  PCP:  [facility], [facility]  Cardiologist:  Dina Rich, MD Electrophysiologist:  None   Chief Complaint: 1 year follow-up  History of Present Illness: Omar Mccarthy is a 30 y.o. male with a history of persistent atrial fibrillation, respiratory failure, lower extremity edema, chest pain of uncertain etiology, EtOH abuse.  He was last seen on April 04, 2020 by Corine Shelter, PA for persistent atrial fibrillation, chest pain, systemic anticoagulation.  Previous presentation January 11, 2020 for dyspnea and 30 pound weight gain over the prior months.  He was consuming heavy quantities of alcohol.  He was found to be in atrial fibrillation with RVR.  He was started on IV Cardizem and Lopressor.  Ultimately requiring IV amiodarone given soft blood pressures.  He had reduced EF 40% by echocardiogram with global hypokinesis, RV function was moderately reduced.  He was discharged on Eliquis 5 mg p.o. twice daily, digoxin 0.125 mg p.o. daily.  Cardizem 360 mg daily, Toprol-XL 100 mg daily.  During hospitalization he went into delirium tremens and required intubation.  Also had hepatitis secondary.  Plan was for TEE guided cardioversion on 01/21/2020 but he postponed the procedure and rate control was pursued. On 02/24/2020 he underwent outpatient DCCV and converted to normal sinus rhythm after 2 shocks with Dr. Diona Browner.    Patient is here for 1 year follow-up.  He is here with his fiance today.  He is complaining of significant tiredness and depression.  He has a history of significant alcohol abuse.  He had an admission in August 2021 for significant weight gain.  Found to be in atrial fibrillation with RVR.  Initially postponed DC cardioversion and later underwent DC cardioversion with Dr. Diona Browner in September.  Immediately upon talking to the patient today he is noticeably jaundiced.   He states he started back drinking again.  He states he drinks around one half of a 1.75 L container of alcohol every 1 to 2 days.  His fiance's stated she noticed his skin started to turn yellow with yellow color to his eyes approximately 3 days ago.  He was taking Eliquis for history of atrial fibrillation but started bleeding from his mouth per his statement and stopped the medication.  Last echocardiogram he had reduced EF of 40% with global hypokinesis.  He had significant DTs during hospital stay requiring intubation as noted above.  I spoke to he and his fiance and told him to go immediately to [facility] emergency room for treatment and mid admission.  We discussed he more than likely has several things going on not the least of which is possible liver failure but possible worsening cardiomyopathy.  He denies any current DOE or SOB.  He and his fiance assured me they will be going to the emergency room at [facility] for evaluation.    Past Medical History:  Diagnosis Date   Atrial fibrillation [facility])    a. diagnosed in 12/2019   Hypertension     Past Surgical History:  Procedure Laterality Date   CARDIOVERSION N/A 02/24/2020   Procedure: CARDIOVERSION;  Surgeon: Jonelle Sidle, MD;  Location: [facility];  Service: Cardiovascular;  Laterality: N/A;    Current Outpatient Medications  Medication Sig Dispense Refill   apixaban (ELIQUIS) 5 MG TABS tablet Take 1 tablet (5 mg total) by mouth 2 (two) times daily. 60 tablet 6   digoxin (  LANOXIN) 0.125 MG tablet Take 1 tablet (0.125 mg total) by mouth daily. 90 tablet 3   furosemide (LASIX) 20 MG tablet Take 1 tablet (20 mg total) by mouth as needed for edema. (Patient not taking: Reported on 04/06/2021) 30 tablet 6   metoprolol succinate (TOPROL-XL) 100 MG 24 hr tablet TAKE ONE TABLET BY MOUTH DAILY WITH OR IMMEDIATELY FOLLOWING A MEAL 90 tablet 3   Multiple Vitamin (MULTIVITAMIN WITH MINERALS) TABS tablet Take 1 tablet by mouth daily.  (Patient not taking: Reported on 04/06/2021) 30 tablet 2   pantoprazole (PROTONIX) 40 MG tablet Take 1 tablet (40 mg total) by mouth daily. Pt needs to keep upcoming appt in Sept for further refills (Patient not taking: Reported on 04/06/2021) 30 tablet 0   thiamine 100 MG tablet Take 1 tablet (100 mg total) by mouth daily. (Patient not taking: No sig reported) 30 tablet 1   No current facility-administered medications for this visit.   Allergies:  Patient has no known allergies.   Social History: The patient  reports that he has been smoking cigarettes. He has been smoking an average of 1 pack per day. He has never used smokeless tobacco. He reports that he does not currently use alcohol. He reports that he does not use drugs.   Family History: The patient's family history includes Colon cancer in his mother; High Cholesterol in his father and mother; High blood pressure in his father and mother.   ROS:  Please see the history of present illness. Otherwise, complete review of systems is positive for none.  All other systems are reviewed and negative.   Physical Exam: VS:  BP (!) 144/88   Pulse 83   Ht 5\' 9"  (1.753 m)   Wt 218 lb (98.9 kg)   SpO2 97%   BMI 32.19 kg/m , BMI Body mass index is 32.19 kg/m.  Wt Readings from Last 3 Encounters:  04/06/21 218 lb (98.9 kg)  04/04/20 260 lb (117.9 kg)  02/22/20 250 lb (113.4 kg)    General: Patient appears comfortable at rest. Neck: Supple, no elevated JVP or carotid bruits, no thyromegaly. Lungs: Clear to auscultation, nonlabored breathing at rest. Cardiac: Regular rate and rhythm, no S3 or significant systolic murmur, no pericardial rub. Extremities: No pitting edema, distal pulses 2+. Skin: Significant jaundice with yellow sclera Musculoskeletal: No kyphosis. Neuropsychiatric: Alert and oriented x3, affect grossly appropriate.  ECG: 04/06/2021 normal sinus rhythm rate of 83, ST abnormality  Recent Labwork: No results found for  requested labs within last 8760 hours.     Component Value Date/Time   TRIG 149 01/16/2020 0523    Other Studies Reviewed Today:  Echo Aug 2021-  1. Left ventricular ejection fraction, by estimation, is approximately  40%. The left ventricle has moderately decreased function. The left  ventricle demonstrates global hypokinesis with some regionla variation.  There is moderate left ventricular  hypertrophy. Left ventricular diastolic parameters are indeterminate in  the setting of atrial fibrillation. No obvious LV mural thrombus.   2. Right ventricular systolic function is moderately reduced. The right  ventricular size is normal. There is normal pulmonary artery systolic  pressure. The estimated right ventricular systolic pressure is 0000000 mmHg.   3. The mitral valve is grossly normal. Mild mitral valve regurgitation.   4. The aortic valve is tricuspid. Aortic valve regurgitation is not  visualized.   5. The inferior vena cava is dilated in size with <50% respiratory  variability, suggesting right atrial pressure of 15  mmHg.    Assessment and Plan:  1. Jaundice   2. Alcohol abuse   3. Atrial fibrillation, unspecified type ([place])    1. Jaundice Patient has significant jaundice today.  He and his fiance state this started about 3 to 4 days ago.  He is a heavy alcohol user.  States he feels tired and depressed.  We discussed going to the emergency room for possible cirrhosis/liver failure.  He states he stopped his Eliquis due to bleeding from his mouth.  I called and gave report to Dr. Eulis Foster ER physician regarding possible arrival of patient.  2. Alcohol abuse States he drinks about one half of 1.75 L container of alcohol every 1 to 2 days.  Previous admission for atrial fibrillation RVR, weight gain, DOE, edema with decreased EF of 40%.  Hospitalization complicated by significant DTs and ultimately intubation.  3. Atrial fibrillation, unspecified type [facility]) Previous admission as  mentioned above with discovery of atrial fibrillation with RVR.  Subsequently had DC cardioversion and September 2021 back to normal sinus rhythm.  EKG today shows normal sinus rhythm with a rate of 83, ST abnormality.  He states he stopped his Eliquis due to easy bleeding.  He has been without his metoprolol and is here for refill.  4.  Cardiomyopathy Admission August of last year with DOE, weight gain, edema, atrial for with RVR.  Echocardiogram EF 40%.  Global hypokinesis with regional variation.  Moderate LVH, indeterminate diastolic parameters, no LV thrombus.  RV function moderately reduced.  Normal PASP, mild MR. He is here for refills today he had run out of his metoprolol, digoxin, furosemide.  Please refill digoxin 0.125 mg p.o. daily, Lasix 20 mg as needed, Toprol-XL 100 mg daily.   Medication Adjustments/Labs and Tests Ordered: Current medicines are reviewed at length with the patient today.  Concerns regarding medicines are outlined above.   Disposition: Follow-up with Dr. Harl Bowie or APP 3 months  Signed, Levell July, NP 04/06/2021 4:39 PM    [facility] at [place], [place], [place] [address] Phone: [phone]; Fax: [phone]

## 2021-04-06 ENCOUNTER — Ambulatory Visit (INDEPENDENT_AMBULATORY_CARE_PROVIDER_SITE_OTHER): Payer: Self-pay | Admitting: Family Medicine

## 2021-04-06 ENCOUNTER — Encounter: Payer: Self-pay | Admitting: Family Medicine

## 2021-04-06 ENCOUNTER — Other Ambulatory Visit: Payer: Self-pay

## 2021-04-06 VITALS — BP 144/88 | HR 83 | Ht 69.0 in | Wt 218.0 lb

## 2021-04-06 DIAGNOSIS — I4891 Unspecified atrial fibrillation: Secondary | ICD-10-CM

## 2021-04-06 DIAGNOSIS — R17 Unspecified jaundice: Secondary | ICD-10-CM

## 2021-04-06 DIAGNOSIS — I4819 Other persistent atrial fibrillation: Secondary | ICD-10-CM

## 2021-04-06 DIAGNOSIS — R079 Chest pain, unspecified: Secondary | ICD-10-CM

## 2021-04-06 DIAGNOSIS — F1010 Alcohol abuse, uncomplicated: Secondary | ICD-10-CM

## 2021-04-06 DIAGNOSIS — Z7901 Long term (current) use of anticoagulants: Secondary | ICD-10-CM

## 2021-04-06 MED ORDER — METOPROLOL SUCCINATE ER 100 MG PO TB24: ORAL_TABLET | ORAL | 3 refills | Status: DC

## 2021-04-06 MED ORDER — DIGOXIN 125 MCG PO TABS
0.1250 mg | ORAL_TABLET | Freq: Every day | ORAL | 3 refills | Status: DC
Start: 2021-04-06 — End: 2021-04-30

## 2021-04-06 NOTE — Patient Instructions (Signed)

## 2021-04-09 ENCOUNTER — Encounter (HOSPITAL_COMMUNITY): Payer: Self-pay | Admitting: *Deleted

## 2021-04-09 ENCOUNTER — Emergency Department (HOSPITAL_COMMUNITY): Payer: Self-pay

## 2021-04-09 ENCOUNTER — Inpatient Hospital Stay (HOSPITAL_COMMUNITY)
Admission: EM | Admit: 2021-04-09 | Discharge: 2021-04-14 | DRG: 432 | Disposition: A | Discharge status: Home / Self Care | Payer: Self-pay | Attending: Internal Medicine | Admitting: Internal Medicine

## 2021-04-09 DIAGNOSIS — R791 Abnormal coagulation profile: Secondary | ICD-10-CM | POA: Diagnosis present

## 2021-04-09 DIAGNOSIS — R1013 Epigastric pain: Secondary | ICD-10-CM

## 2021-04-09 DIAGNOSIS — Z79899 Other long term (current) drug therapy: Secondary | ICD-10-CM

## 2021-04-09 DIAGNOSIS — D6489 Other specified anemias: Secondary | ICD-10-CM | POA: Diagnosis present

## 2021-04-09 DIAGNOSIS — K760 Fatty (change of) liver, not elsewhere classified: Secondary | ICD-10-CM | POA: Diagnosis present

## 2021-04-09 DIAGNOSIS — E8809 Other disorders of plasma-protein metabolism, not elsewhere classified: Secondary | ICD-10-CM | POA: Diagnosis present

## 2021-04-09 DIAGNOSIS — I110 Hypertensive heart disease with heart failure: Secondary | ICD-10-CM | POA: Diagnosis present

## 2021-04-09 DIAGNOSIS — Z9114 Patient's other noncompliance with medication regimen: Secondary | ICD-10-CM

## 2021-04-09 DIAGNOSIS — I5022 Chronic systolic (congestive) heart failure: Secondary | ICD-10-CM | POA: Diagnosis present

## 2021-04-09 DIAGNOSIS — Z7901 Long term (current) use of anticoagulants: Secondary | ICD-10-CM

## 2021-04-09 DIAGNOSIS — K828 Other specified diseases of gallbladder: Secondary | ICD-10-CM | POA: Diagnosis present

## 2021-04-09 DIAGNOSIS — E538 Deficiency of other specified B group vitamins: Secondary | ICD-10-CM | POA: Diagnosis present

## 2021-04-09 DIAGNOSIS — R195 Other fecal abnormalities: Secondary | ICD-10-CM | POA: Diagnosis present

## 2021-04-09 DIAGNOSIS — F10231 Alcohol dependence with withdrawal delirium: Secondary | ICD-10-CM | POA: Diagnosis present

## 2021-04-09 DIAGNOSIS — Y908 Blood alcohol level of 240 mg/100 ml or more: Secondary | ICD-10-CM | POA: Diagnosis present

## 2021-04-09 DIAGNOSIS — D539 Nutritional anemia, unspecified: Secondary | ICD-10-CM | POA: Diagnosis present

## 2021-04-09 DIAGNOSIS — Z20822 Contact with and (suspected) exposure to covid-19: Secondary | ICD-10-CM | POA: Diagnosis present

## 2021-04-09 DIAGNOSIS — R194 Change in bowel habit: Secondary | ICD-10-CM | POA: Diagnosis present

## 2021-04-09 DIAGNOSIS — F10229 Alcohol dependence with intoxication, unspecified: Secondary | ICD-10-CM | POA: Diagnosis present

## 2021-04-09 DIAGNOSIS — F1910 Other psychoactive substance abuse, uncomplicated: Secondary | ICD-10-CM | POA: Diagnosis present

## 2021-04-09 DIAGNOSIS — F1510 Other stimulant abuse, uncomplicated: Secondary | ICD-10-CM | POA: Diagnosis present

## 2021-04-09 DIAGNOSIS — D696 Thrombocytopenia, unspecified: Secondary | ICD-10-CM | POA: Diagnosis present

## 2021-04-09 DIAGNOSIS — E43 Unspecified severe protein-calorie malnutrition: Secondary | ICD-10-CM | POA: Diagnosis present

## 2021-04-09 DIAGNOSIS — F419 Anxiety disorder, unspecified: Secondary | ICD-10-CM | POA: Diagnosis present

## 2021-04-09 DIAGNOSIS — F10931 Alcohol use, unspecified with withdrawal delirium: Secondary | ICD-10-CM

## 2021-04-09 DIAGNOSIS — K7460 Unspecified cirrhosis of liver: Secondary | ICD-10-CM | POA: Diagnosis present

## 2021-04-09 DIAGNOSIS — D61818 Other pancytopenia: Secondary | ICD-10-CM | POA: Diagnosis present

## 2021-04-09 DIAGNOSIS — D6959 Other secondary thrombocytopenia: Secondary | ICD-10-CM | POA: Diagnosis present

## 2021-04-09 DIAGNOSIS — R7401 Elevation of levels of liver transaminase levels: Secondary | ICD-10-CM | POA: Diagnosis present

## 2021-04-09 DIAGNOSIS — F10129 Alcohol abuse with intoxication, unspecified: Secondary | ICD-10-CM | POA: Diagnosis present

## 2021-04-09 DIAGNOSIS — K7010 Alcoholic hepatitis without ascites: Principal | ICD-10-CM | POA: Diagnosis present

## 2021-04-09 DIAGNOSIS — I480 Paroxysmal atrial fibrillation: Secondary | ICD-10-CM | POA: Diagnosis present

## 2021-04-09 DIAGNOSIS — K219 Gastro-esophageal reflux disease without esophagitis: Secondary | ICD-10-CM | POA: Diagnosis present

## 2021-04-09 DIAGNOSIS — R748 Abnormal levels of other serum enzymes: Secondary | ICD-10-CM

## 2021-04-09 DIAGNOSIS — R000 Tachycardia, unspecified: Secondary | ICD-10-CM | POA: Diagnosis present

## 2021-04-09 DIAGNOSIS — G8929 Other chronic pain: Secondary | ICD-10-CM | POA: Diagnosis present

## 2021-04-09 DIAGNOSIS — I5020 Unspecified systolic (congestive) heart failure: Secondary | ICD-10-CM | POA: Diagnosis present

## 2021-04-09 DIAGNOSIS — T510X1A Toxic effect of ethanol, accidental (unintentional), initial encounter: Secondary | ICD-10-CM | POA: Diagnosis present

## 2021-04-09 DIAGNOSIS — I509 Heart failure, unspecified: Secondary | ICD-10-CM

## 2021-04-09 DIAGNOSIS — E7220 Disorder of urea cycle metabolism, unspecified: Secondary | ICD-10-CM | POA: Diagnosis present

## 2021-04-09 DIAGNOSIS — F17210 Nicotine dependence, cigarettes, uncomplicated: Secondary | ICD-10-CM | POA: Diagnosis present

## 2021-04-09 DIAGNOSIS — R739 Hyperglycemia, unspecified: Secondary | ICD-10-CM | POA: Diagnosis present

## 2021-04-09 DIAGNOSIS — F32A Depression, unspecified: Secondary | ICD-10-CM | POA: Diagnosis present

## 2021-04-09 DIAGNOSIS — D638 Anemia in other chronic diseases classified elsewhere: Secondary | ICD-10-CM | POA: Diagnosis present

## 2021-04-09 DIAGNOSIS — I422 Other hypertrophic cardiomyopathy: Secondary | ICD-10-CM | POA: Diagnosis present

## 2021-04-09 DIAGNOSIS — K7200 Acute and subacute hepatic failure without coma: Secondary | ICD-10-CM | POA: Diagnosis present

## 2021-04-09 DIAGNOSIS — K831 Obstruction of bile duct: Secondary | ICD-10-CM | POA: Diagnosis present

## 2021-04-09 LAB — RESP PANEL BY RT-PCR (FLU A&B, COVID) ARPGX2
Influenza A by PCR: NEGATIVE
Influenza B by PCR: NEGATIVE
SARS Coronavirus 2 by RT PCR: NEGATIVE

## 2021-04-09 LAB — CBC WITH DIFFERENTIAL/PLATELET
Abs Immature Granulocytes: 0.03 10*3/uL (ref 0.00–0.07)
Basophils Absolute: 0.1 10*3/uL (ref 0.0–0.1)
Basophils Relative: 3 %
Eosinophils Absolute: 0 10*3/uL (ref 0.0–0.5)
Eosinophils Relative: 1 %
HCT: 36.8 % — ABNORMAL LOW (ref 39.0–52.0)
Hemoglobin: 12.6 g/dL — ABNORMAL LOW (ref 13.0–17.0)
Immature Granulocytes: 1 %
Lymphocytes Relative: 22 %
Lymphs Abs: 0.9 10*3/uL (ref 0.7–4.0)
MCH: 35.5 pg — ABNORMAL HIGH (ref 26.0–34.0)
MCHC: 34.2 g/dL (ref 30.0–36.0)
MCV: 103.7 fL — ABNORMAL HIGH (ref 80.0–100.0)
Monocytes Absolute: 0.4 10*3/uL (ref 0.1–1.0)
Monocytes Relative: 11 %
Neutro Abs: 2.4 10*3/uL (ref 1.7–7.7)
Neutrophils Relative %: 62 %
Platelets: 106 10*3/uL — ABNORMAL LOW (ref 150–400)
RBC: 3.55 MIL/uL — ABNORMAL LOW (ref 4.22–5.81)
RDW: 13.4 % (ref 11.5–15.5)
WBC: 3.9 10*3/uL — ABNORMAL LOW (ref 4.0–10.5)
nRBC: 0 % (ref 0.0–0.2)

## 2021-04-09 LAB — ACETAMINOPHEN LEVEL: Acetaminophen (Tylenol), Serum: <10 ug/mL — ABNORMAL LOW (ref 10–30)

## 2021-04-09 LAB — COMPREHENSIVE METABOLIC PANEL
ALT: 307 U/L — ABNORMAL HIGH (ref 0–44)
AST: 922 U/L — ABNORMAL HIGH (ref 15–41)
Albumin: 3.2 g/dL — ABNORMAL LOW (ref 3.5–5.0)
Alkaline Phosphatase: 180 U/L — ABNORMAL HIGH (ref 38–126)
Anion gap: 16 — ABNORMAL HIGH (ref 5–15)
BUN: 7 mg/dL (ref 6–20)
CO2: 23 mmol/L (ref 22–32)
Calcium: 8.2 mg/dL — ABNORMAL LOW (ref 8.9–10.3)
Chloride: 98 mmol/L (ref 98–111)
Creatinine, Ser: 0.32 mg/dL — ABNORMAL LOW (ref 0.61–1.24)
GFR, Estimated: >60 mL/min (ref >60)
Glucose, Bld: 120 mg/dL — ABNORMAL HIGH (ref 70–99)
Potassium: 3.7 mmol/L (ref 3.5–5.1)
Sodium: 137 mmol/L (ref 135–145)
Total Bilirubin: 14.3 mg/dL — ABNORMAL HIGH (ref 0.3–1.2)
Total Protein: 7.2 g/dL (ref 6.5–8.1)

## 2021-04-09 LAB — MAGNESIUM: Magnesium: 1.6 mg/dL — ABNORMAL LOW (ref 1.7–2.4)

## 2021-04-09 LAB — ETHANOL: Alcohol, Ethyl (B): 491 mg/dL (ref <10)

## 2021-04-09 LAB — PROTIME-INR
INR: 1.4 — ABNORMAL HIGH (ref 0.8–1.2)
Prothrombin Time: 16.9 seconds — ABNORMAL HIGH (ref 11.4–15.2)

## 2021-04-09 LAB — BRAIN NATRIURETIC PEPTIDE: B Natriuretic Peptide: 58 pg/mL (ref 0.0–100.0)

## 2021-04-09 LAB — AMMONIA: Ammonia: 57 umol/L — ABNORMAL HIGH (ref 9–35)

## 2021-04-09 LAB — LIPASE, BLOOD: Lipase: 50 U/L (ref 11–51)

## 2021-04-09 MED ORDER — LORAZEPAM 2 MG/ML IJ SOLN
0.0000 mg | Freq: Four times a day (QID) | INTRAMUSCULAR | Status: DC
Start: 2021-04-09 — End: 2021-04-10
  Administered 2021-04-09: 4 mg via INTRAVENOUS
  Filled 2021-04-09: qty 2

## 2021-04-09 MED ORDER — MAGNESIUM SULFATE 2 GM/50ML IV SOLN
2.0000 g | Freq: Once | INTRAVENOUS | Status: AC
  Administered 2021-04-10: 2 g via INTRAVENOUS
  Filled 2021-04-09: qty 50

## 2021-04-09 MED ORDER — THIAMINE HCL 100 MG PO TABS
100.0000 mg | ORAL_TABLET | Freq: Every day | ORAL | Status: DC
  Administered 2021-04-10 – 2021-04-14 (×5): 100 mg via ORAL
  Filled 2021-04-09 (×5): qty 1

## 2021-04-09 MED ORDER — THIAMINE HCL 100 MG/ML IJ SOLN
100.0000 mg | Freq: Every day | INTRAMUSCULAR | Status: DC
  Administered 2021-04-09: 100 mg via INTRAVENOUS
  Filled 2021-04-09: qty 2

## 2021-04-09 MED ORDER — LORAZEPAM 2 MG/ML IJ SOLN: 0.0000 mg | Freq: Two times a day (BID) | INTRAMUSCULAR | Status: DC

## 2021-04-09 MED ORDER — LORAZEPAM 1 MG PO TABS
0.0000 mg | ORAL_TABLET | Freq: Two times a day (BID) | ORAL | Status: DC
Start: 2021-04-12 — End: 2021-04-10

## 2021-04-09 MED ORDER — LORAZEPAM 1 MG PO TABS: 0.0000 mg | ORAL_TABLET | Freq: Four times a day (QID) | ORAL | Status: DC

## 2021-04-09 NOTE — H&P (Signed)
History and Physical    ERIOLUWA BEDELL [id number] DOB: 1990/07/10 DOA: 04/09/2021  PCP: [facility] [place]   Patient coming from: Home  I have personally briefly reviewed patient's old medical records in [place]  Chief Complaint: Jaundice  HPI: Omar Mccarthy is a 30 y.o. male with medical history significant for alcohol abuse and intoxication, depression and anxiety, atrial fibrillation in the setting of alcohol intoxication, respiratory failure requiring intubation.  Patient presented to the ED reports of jaundice.  Patient went to see his cardiologist 3 days ago on Friday, he was found to be jaundiced, he had some blood work done and was told he was in liver failure  and was told to the ED, he reports due to personal matters he came to the ED today.  He reports some difficulty breathing sometimes at rest and sometimes with exertion. He endorses significant alcohol intake, half a gallon of vodka every day.  His last drink was 1 hour prior to arrival in the ED this evening.  He reports he has been drinking due to his depression and is afraid to stop because he has gone through withdrawals in the past.  Reports history of seizures with his withdrawals.  He denies abdominal pain.  He reports multiple episodes of dry heaving, and poor oral intake.  No diarrhea.  He endorses taking Vicodin 10 mg every day for chronic pain, this is not the prescribed medication.  He also takes Adderall also not prescribed.  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  Heart rate 88- 94.  Respiratory rate 14-20.  Blood pressure systolic [id number].  O2 sats greater than 97% on room air.  Markedly elevated liver enzymes, AST 922, ALT 307, ALP 180, total bilirubin 14.3.  Ammonia level 57.  BNP 58.  Chest x-ray without acute abnormality.  Blood alcohol level markedly elevated at 491.  Hepatitis panel ordered and pending. EDP talked to GI,  recommended admission, ultrasound  Review of Systems: As per HPI all other systems reviewed and negative.  Past Medical History:  Diagnosis Date   Atrial fibrillation [facility])    a. diagnosed in 12/2019   Hypertension     Past Surgical History:  Procedure Laterality Date   CARDIOVERSION N/A 02/24/2020   Procedure: CARDIOVERSION;  Surgeon: Satira Sark, MD;  Location[facility];  Service: Cardiovascular;  Laterality: N/A;     reports that he has been smoking cigarettes. He has been smoking an average of 1 pack per day. He has never used smokeless tobacco. He reports that he does not currently use alcohol. He reports that he does not use drugs.  No Known Allergies  Family History  Problem Relation Age of Onset   High blood pressure Mother    High Cholesterol Mother    Colon cancer Mother    High blood pressure Father    High Cholesterol Father     Prior to Admission medications   Medication Sig Start Date End Date Taking? Authorizing Provider  acetaminophen (TYLENOL) 500 MG tablet Take 1,000 mg by mouth every 6 (six) hours as needed.   Yes [provider]  digoxin (LANOXIN) 0.125 MG tablet Take 1 tablet (0.125 mg total) by mouth daily. 04/06/21  Yes Verta Ellen., NP  metoprolol succinate (TOPROL-XL) 100 MG 24 hr tablet TAKE ONE TABLET BY MOUTH DAILY WITH OR IMMEDIATELY FOLLOWING A MEAL 04/06/21  Yes Verta Ellen.,  NP  apixaban (ELIQUIS) 5 MG TABS tablet Take 1 tablet (5 mg total) by mouth 2 (two) times daily. Patient not taking: No sig reported 02/17/20   Ellsworth Lennox, PA-C  furosemide (LASIX) 20 MG tablet Take 1 tablet (20 mg total) by mouth as needed for edema. Patient not taking: No sig reported 02/17/20 05/17/20  Ellsworth Lennox, PA-C  Multiple Vitamin (MULTIVITAMIN WITH MINERALS) TABS tablet Take 1 tablet by mouth daily. Patient not taking: No sig reported 01/23/20   Shon Hale, MD  pantoprazole (PROTONIX) 40 MG tablet Take 1  tablet (40 mg total) by mouth daily. Pt needs to keep upcoming appt in Sept for further refills Patient not taking: No sig reported 01/26/21   Iran Ouch, Lennart Pall, PA-C  thiamine 100 MG tablet Take 1 tablet (100 mg total) by mouth daily. Patient not taking: No sig reported 01/23/20   Shon Hale, MD    Physical Exam: Vitals:   04/09/21 2000 04/09/21 2030 04/09/21 2037 04/09/21 2100  BP: 131/87 126/70 126/70 125/75  Pulse: 88 88 96 94  Resp: 20   14  Temp:      TempSrc:      SpO2: 100% 100%  97%    Constitutional: hiccups, comfortable Vitals:   04/09/21 2000 04/09/21 2030 04/09/21 2037 04/09/21 2100  BP: 131/87 126/70 126/70 125/75  Pulse: 88 88 96 94  Resp: 20   14  Temp:      TempSrc:      SpO2: 100% 100%  97%   Eyes: Icteric conjunctiva, lids and conjunctivae normal.  Pupils equal round ENMT: Mucous membranes are dry.  Neck: normal, supple, no masses, no thyromegaly Respiratory: clear to auscultation bilaterally, no wheezing, no crackles. Normal respiratory effort. No accessory muscle use.  Cardiovascular: Regular rate and rhythm, no murmurs / rubs / gallops. No extremity edema. 2+ pedal pulses.  Abdomen: no tenderness, no masses palpated. No hepatosplenomegaly.   Musculoskeletal: no clubbing / cyanosis. No joint deformity upper and lower extremities. Good ROM, no contractures. Normal muscle tone.  Skin: no rashes, lesions, ulcers. No induration Neurologic: No apparent cranial nerve abnormality, moving extremities spontaneously. Psychiatric: Normal judgment and insight. Alert and oriented x 3. Normal mood.   Labs on Admission: I have personally reviewed following labs and imaging studies  CBC: Recent Labs  Lab 04/09/21 2021  WBC 3.9*  NEUTROABS 2.4  HGB 12.6*  HCT 36.8*  MCV 103.7*  PLT 106*   Basic Metabolic Panel: Recent Labs  Lab 04/09/21 2021  NA 137  K 3.7  CL 98  CO2 23  GLUCOSE 120*  BUN 7  CREATININE 0.32*  CALCIUM 8.2*  MG 1.6*    Liver  Function Tests: Recent Labs  Lab 04/09/21 2021  AST 922*  ALT 307*  ALKPHOS 180*  BILITOT 14.3*  PROT 7.2  ALBUMIN 3.2*   Recent Labs  Lab 04/09/21 2021  LIPASE 50   Recent Labs  Lab 04/09/21 2021  AMMONIA 57*   Coagulation Profile: Recent Labs  Lab 04/09/21 2021  INR 1.4*    Radiological Exams on Admission: DG Chest Portable 1 View  Result Date: 04/09/2021 CLINICAL DATA:  Shortness of breath, abdominal pain EXAM: PORTABLE CHEST 1 VIEW COMPARISON:  01/16/2020 FINDINGS: Heart and mediastinal contours are within normal limits. No focal opacities or effusions. No acute bony abnormality. IMPRESSION: No active disease. Electronically Signed   By: Charlett Nose M.D.   On: 04/09/2021 20:36    EKG: Pending   Assessment/Plan  Active Problems:   Alcohol abuse with intoxication ([place])   Acute alcoholic hepatitis  Acute alcoholic hepatitis with alcohol intoxication-elevated liver enzymes, AST 92, ALT 307, ALP 180, total bilirubin 14.3.  Blood alcohol level 491.  Hospitalization 12/2019, patient went into DTs, and required intubation for acute hypoxic respiratory failure.  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 Tylenol level unremarkable. - EDP talked with Dr. Abbey Chatters, GI to see in the morning -Follow-up acute hepatitis panel - Right upper quadrant ultrasound - Currently no sign of withdrawal, will likely need transfer to stepdown in a day or 2 - 1 L  bolus, N/s 100cc/hr x 20 hrs - UDS -Patient's Maddrey's discriminant score is ~ 32, will start prednisolone, 40 mg daily -CMP, CBC in a.m. -CIWA as needed and scheduled -Magnesium low 1.6, check phosphorus - Thiamine folate multivitamins  Atria fibrillation-diagnosed in the setting of alcohol intoxication, 12/2019, required amiodarone drip.  CHADS2vasc score- 2, He was started on Eliquis, and continued on discharge, but per notes patient reports he has stopped its after he started  having bleeding from his mouth.  On discharge patient followed with cardiology and underwent DCCV and converted to normal sinus rhythm after 2 shocks with Dr. Conni Elliot 01/2020.  - EKG pending.  Currently rate controlled.  He is not currently taking Eliquis, will not resume -Resume metoprolol, digoxin  DVT prophylaxis: Lovenox Code Status: Full code Family Communication: Significant other at bedside Disposition Plan: ~/> 2 days Consults called: GI Admission status: Inpt, Tele I certify that at the point of admission it is my clinical judgment that the patient will require inpatient hospital care spanning beyond 2 midnights from the point of admission due to high intensity of service, high risk for further deterioration and high frequency of surveillance required.    Bethena Roys MD [facility]  04/09/2021, 12:11 AM

## 2021-04-09 NOTE — ED Triage Notes (Signed)
Alcohol abuse, eyes are yellow

## 2021-04-09 NOTE — ED Provider Notes (Signed)
Emergency Department Provider Note   I have reviewed the triage vital signs and the nursing notes.   HISTORY  Chief Complaint Abdominal Pain   HPI PERLA Omar Mccarthy is a 30 y.o. male with PMH of A fib, HTN, and EtOH abuse with withdrawal history presents to the emergency department for evaluation of jaundice with vague abdominal pain and nausea.  Patient has a [name] history of heavy alcohol abuse.  He drinks approximately 1/2 gallon of vodka daily.  He has stopped drinking in the past but notes prior history of DTs with stopping.  He has been following with his cardiologist in [place] after atrial fibrillation.  That appointment was Friday and they found him to be jaundiced and complaining of some shortness of breath.  He tells me they ran blood work which showed him to be in liver failure and he was referred to the hospital.  He did not come in immediately because he needed to take care of some personal matters but presents today with continued symptoms.  He is not having fever, confusion, worsening pain.  He continues to have jaundice which seems to be worsening according to him and his partner at bedside.  He denies any IV drugs.  He is feeling some mild shortness of breath.  His last drink was about 1 hour ago.  Denies Tylenol use/abuse.  Denies any suicidal or homicidal ideation although notes he has been struggling with depression and anxiety for a Kanaya Gunnarson time.   Past Medical History:  Diagnosis Date   Atrial fibrillation ([place])    a. diagnosed in 12/2019   Hypertension     Patient Active Problem List   Diagnosis Date Noted   Acute alcoholic hepatitis [id number]   Chest pain of uncertain etiology [id number]   Anticoagulated 04/04/2020   Persistent atrial fibrillation ([place]) 01/19/2020   Respiratory failure requiring intubation ([place])    Lack of intravenous access    Fluid overload 01/11/2020   Alcohol abuse with intoxication ([place]) 01/11/2020   Leg swelling 01/11/2020    Past  Surgical History:  Procedure Laterality Date   CARDIOVERSION N/A 02/24/2020   Procedure: CARDIOVERSION;  Surgeon: Satira Sark, MD;  Location[facility];  Service: Cardiovascular;  Laterality: N/A;    Allergies Patient has no known allergies.  Family History  Problem Relation Age of Onset   High blood pressure Mother    High Cholesterol Mother    Colon cancer Mother    High blood pressure Father    High Cholesterol Father     Social History Social History   Tobacco Use   Smoking status: Every Day    Packs/day: 1.00    Types: Cigarettes   Smokeless tobacco: Never  Vaping Use   Vaping Use: Never used  Substance Use Topics   Alcohol use: Not Currently    Comment: stopped 01/12/20   Drug use: Never    Review of Systems  Constitutional: No fever/chills Eyes: No visual changes. ENT: No sore throat. Cardiovascular: Denies chest pain. Respiratory: Mild shortness of breath. Gastrointestinal: Positive abdominal pain.  No nausea, no vomiting.  No diarrhea.  No constipation. Genitourinary: Negative for dysuria. Musculoskeletal: Negative for back pain. Skin: Positive jaundice.  Neurological: Negative for headaches, focal weakness or numbness.  10-point ROS otherwise negative.  ____________________________________________   PHYSICAL EXAM:  VITAL SIGNS: ED Triage Vitals [04/09/21 1854]  Enc Vitals Group     BP 139/83     Pulse Rate (!) 110  Resp 20     Temp 98.8 F (37.1 C)     Temp Source Oral    Constitutional: Alert and oriented. Well appearing and in no acute distress. Eyes: Conjunctivae are icteric.  Head: Atraumatic. Nose: No congestion/rhinnorhea. Mouth/Throat: Mucous membranes are moist.   Neck: No stridor.  Cardiovascular: Normal rate, regular rhythm. Good peripheral circulation. Grossly normal heart sounds.   Respiratory: Normal respiratory effort.  No retractions. Lungs CTAB. Gastrointestinal: Soft and nontender. No distention. Liver  edge is firm and enlarged. Negative Murphy's sign. No peritonitis.  Musculoskeletal: No lower extremity tenderness nor edema. No gross deformities of extremities. Neurologic:  Normal speech and language. No gross focal neurologic deficits are appreciated. No tremors.  Skin:  Skin is warm, dry and intact. Notable jaundice.    ____________________________________________   LABS (all labs ordered are listed, but only abnormal results are displayed)  Labs Reviewed  ACETAMINOPHEN LEVEL - Abnormal; Notable for the following components:      Result Value   Acetaminophen (Tylenol), Serum <10 (*)    All other components within normal limits  COMPREHENSIVE METABOLIC PANEL - Abnormal; Notable for the following components:   Glucose, Bld 120 (*)    Creatinine, Ser 0.32 (*)    Calcium 8.2 (*)    Albumin 3.2 (*)    AST 922 (*)    ALT 307 (*)    Alkaline Phosphatase 180 (*)    Total Bilirubin 14.3 (*)    Anion gap 16 (*)    All other components within normal limits  ETHANOL - Abnormal; Notable for the following components:   Alcohol, Ethyl (B) 491 (*)    All other components within normal limits  CBC WITH DIFFERENTIAL/PLATELET - Abnormal; Notable for the following components:   WBC 3.9 (*)    RBC 3.55 (*)    Hemoglobin 12.6 (*)    HCT 36.8 (*)    MCV 103.7 (*)    MCH 35.5 (*)    Platelets 106 (*)    All other components within normal limits  PROTIME-INR - Abnormal; Notable for the following components:   Prothrombin Time 16.9 (*)    INR 1.4 (*)    All other components within normal limits  MAGNESIUM - Abnormal; Notable for the following components:   Magnesium 1.6 (*)    All other components within normal limits  AMMONIA - Abnormal; Notable for the following components:   Ammonia 57 (*)    All other components within normal limits  RESP PANEL BY RT-PCR (FLU A&B, COVID) ARPGX2  LIPASE, BLOOD  BRAIN NATRIURETIC PEPTIDE  URINALYSIS, ROUTINE W REFLEX MICROSCOPIC  RAPID URINE DRUG  SCREEN, HOSP PERFORMED  HEPATITIS PANEL, ACUTE   ____________________________________________  RADIOLOGY  DG Chest Portable 1 View  Result Date: 04/09/2021 CLINICAL DATA:  Shortness of breath, abdominal pain EXAM: PORTABLE CHEST 1 VIEW COMPARISON:  01/16/2020 FINDINGS: Heart and mediastinal contours are within normal limits. No focal opacities or effusions. No acute bony abnormality. IMPRESSION: No active disease. Electronically Signed   By: Charlett Nose M.D.   On: 04/09/2021 20:36    ____________________________________________   PROCEDURES  Procedure(s) performed:   Procedures  None  ____________________________________________   INITIAL IMPRESSION / ASSESSMENT AND PLAN / ED COURSE  Pertinent labs & imaging results that were available during my care of the patient were reviewed by me and considered in my medical decision making (see chart for details).   Patient arrives to the emergency department with jaundice, mild abdominal discomfort, and  fatigue.  He appears jaundiced.  He is afebrile.  He is not confused.  This does not appear to be from Tylenol or other medication overdose.  He does not use IV drugs.  I will send off hepatitis, INR, Tylenol levels but low suspicion that this is causing symptoms.  Given history, this is likely alcohol related. No [place] available at this time.   COVID and Flu negative. INR WNL. EtOH 491. LFTs and Bili significantly elevated. Spoke with Dr. Abbey Chatters with GI. Will need [place] in the AM with [facility] admit and they will consult.   Discussed patient's case with [facility] to request admission. Patient and family (if present) updated with plan. Care transferred to [facility] service.  I reviewed all nursing notes, vitals, pertinent old records, EKGs, labs, imaging (as available).   ____________________________________________  FINAL CLINICAL IMPRESSION(S) / ED DIAGNOSES  Final diagnoses:  Hyperbilirubinemia  Epigastric pain     MEDICATIONS GIVEN DURING THIS  VISIT:  Medications  LORazepam (ATIVAN) injection 0-4 mg (4 mg Intravenous Given 04/09/21 2101)    Or  LORazepam (ATIVAN) tablet 0-4 mg ( Oral See Alternative 04/09/21 2101)  LORazepam (ATIVAN) injection 0-4 mg (has no administration in time range)    Or  LORazepam (ATIVAN) tablet 0-4 mg (has no administration in time range)  thiamine tablet 100 mg ( Oral See Alternative 04/09/21 2103)    Or  thiamine (B-1) injection 100 mg (100 mg Intravenous Given 04/09/21 2103)    Note:  This document was prepared using Dragon voice recognition software and may include unintentional dictation errors.  Nanda Quinton, MD, [facility] Emergency Medicine    Javarri Segal, [facility], MD 04/09/21 [phone]

## 2021-04-09 NOTE — Addendum Note (Signed)
Addended by: Leonides Schanz C on: 04/09/2021 09:14 AM   Modules accepted: Orders

## 2021-04-10 ENCOUNTER — Inpatient Hospital Stay (HOSPITAL_COMMUNITY): Payer: Self-pay

## 2021-04-10 ENCOUNTER — Encounter (HOSPITAL_COMMUNITY): Payer: Self-pay | Admitting: Family Medicine

## 2021-04-10 ENCOUNTER — Other Ambulatory Visit (HOSPITAL_COMMUNITY): Payer: Self-pay | Admitting: *Deleted

## 2021-04-10 ENCOUNTER — Other Ambulatory Visit: Payer: Self-pay

## 2021-04-10 DIAGNOSIS — R739 Hyperglycemia, unspecified: Secondary | ICD-10-CM | POA: Diagnosis present

## 2021-04-10 DIAGNOSIS — R7401 Elevation of levels of liver transaminase levels: Secondary | ICD-10-CM | POA: Diagnosis present

## 2021-04-10 DIAGNOSIS — R1013 Epigastric pain: Secondary | ICD-10-CM | POA: Insufficient documentation

## 2021-04-10 DIAGNOSIS — F1510 Other stimulant abuse, uncomplicated: Secondary | ICD-10-CM

## 2021-04-10 DIAGNOSIS — F1910 Other psychoactive substance abuse, uncomplicated: Secondary | ICD-10-CM | POA: Diagnosis present

## 2021-04-10 DIAGNOSIS — E7220 Disorder of urea cycle metabolism, unspecified: Secondary | ICD-10-CM | POA: Diagnosis present

## 2021-04-10 DIAGNOSIS — D638 Anemia in other chronic diseases classified elsewhere: Secondary | ICD-10-CM | POA: Diagnosis present

## 2021-04-10 DIAGNOSIS — R791 Abnormal coagulation profile: Secondary | ICD-10-CM

## 2021-04-10 DIAGNOSIS — K219 Gastro-esophageal reflux disease without esophagitis: Secondary | ICD-10-CM

## 2021-04-10 DIAGNOSIS — F419 Anxiety disorder, unspecified: Secondary | ICD-10-CM | POA: Diagnosis present

## 2021-04-10 DIAGNOSIS — I428 Other cardiomyopathies: Secondary | ICD-10-CM

## 2021-04-10 DIAGNOSIS — K921 Melena: Secondary | ICD-10-CM

## 2021-04-10 DIAGNOSIS — K760 Fatty (change of) liver, not elsewhere classified: Secondary | ICD-10-CM

## 2021-04-10 DIAGNOSIS — R112 Nausea with vomiting, unspecified: Secondary | ICD-10-CM

## 2021-04-10 DIAGNOSIS — E43 Unspecified severe protein-calorie malnutrition: Secondary | ICD-10-CM | POA: Diagnosis present

## 2021-04-10 DIAGNOSIS — T510X1A Toxic effect of ethanol, accidental (unintentional), initial encounter: Secondary | ICD-10-CM | POA: Diagnosis present

## 2021-04-10 DIAGNOSIS — K7200 Acute and subacute hepatic failure without coma: Secondary | ICD-10-CM

## 2021-04-10 DIAGNOSIS — I480 Paroxysmal atrial fibrillation: Secondary | ICD-10-CM

## 2021-04-10 DIAGNOSIS — E8809 Other disorders of plasma-protein metabolism, not elsewhere classified: Secondary | ICD-10-CM | POA: Diagnosis present

## 2021-04-10 DIAGNOSIS — Z9114 Patient's other noncompliance with medication regimen: Secondary | ICD-10-CM

## 2021-04-10 DIAGNOSIS — I509 Heart failure, unspecified: Secondary | ICD-10-CM

## 2021-04-10 DIAGNOSIS — I5020 Unspecified systolic (congestive) heart failure: Secondary | ICD-10-CM

## 2021-04-10 DIAGNOSIS — D696 Thrombocytopenia, unspecified: Secondary | ICD-10-CM | POA: Diagnosis present

## 2021-04-10 DIAGNOSIS — R748 Abnormal levels of other serum enzymes: Secondary | ICD-10-CM

## 2021-04-10 DIAGNOSIS — R000 Tachycardia, unspecified: Secondary | ICD-10-CM | POA: Diagnosis present

## 2021-04-10 DIAGNOSIS — G8929 Other chronic pain: Secondary | ICD-10-CM | POA: Diagnosis present

## 2021-04-10 LAB — CBC
HCT: 33 % — ABNORMAL LOW (ref 39.0–52.0)
Hemoglobin: 11.4 g/dL — ABNORMAL LOW (ref 13.0–17.0)
MCH: 36 pg — ABNORMAL HIGH (ref 26.0–34.0)
MCHC: 34.5 g/dL (ref 30.0–36.0)
MCV: 104.1 fL — ABNORMAL HIGH (ref 80.0–100.0)
Platelets: 95 10*3/uL — ABNORMAL LOW (ref 150–400)
RBC: 3.17 MIL/uL — ABNORMAL LOW (ref 4.22–5.81)
RDW: 13.5 % (ref 11.5–15.5)
WBC: 4.7 10*3/uL (ref 4.0–10.5)
nRBC: 0 % (ref 0.0–0.2)

## 2021-04-10 LAB — ECHOCARDIOGRAM COMPLETE
AR max vel: 3 cm2
AV Area VTI: 2.62 cm2
AV Area mean vel: 3.3 cm2
AV Mean grad: 12.5 mmHg
AV Peak grad: 27.1 mmHg
Ao pk vel: 2.61 m/s
Area-P 1/2: 4.54 cm2
Calc EF: 60.1 %
MV VTI: 4.22 cm2
S' Lateral: 2.6 cm
Single Plane A2C EF: 63.9 %
Single Plane A4C EF: 54.8 %

## 2021-04-10 LAB — RAPID URINE DRUG SCREEN, HOSP PERFORMED
Amphetamines: NOT DETECTED
Barbiturates: NOT DETECTED
Benzodiazepines: NOT DETECTED
Cocaine: NOT DETECTED
Opiates: POSITIVE — AB
Tetrahydrocannabinol: NOT DETECTED

## 2021-04-10 LAB — HEMOGLOBIN A1C
Hgb A1c MFr Bld: 4.7 % — ABNORMAL LOW (ref 4.8–5.6)
Mean Plasma Glucose: 88.19 mg/dL

## 2021-04-10 LAB — VITAMIN D 25 HYDROXY (VIT D DEFICIENCY, FRACTURES): Vit D, 25-Hydroxy: 9.52 ng/mL — ABNORMAL LOW (ref 30–100)

## 2021-04-10 LAB — HEPATITIS PANEL, ACUTE
HCV Ab: NONREACTIVE
Hep A IgM: NONREACTIVE
Hep B C IgM: NONREACTIVE
Hepatitis B Surface Ag: NONREACTIVE

## 2021-04-10 LAB — URINALYSIS, ROUTINE W REFLEX MICROSCOPIC
Bacteria, UA: NONE SEEN
Glucose, UA: NEGATIVE mg/dL
Ketones, ur: NEGATIVE mg/dL
Leukocytes,Ua: NEGATIVE
Nitrite: NEGATIVE
Protein, ur: 100 mg/dL — AB
Specific Gravity, Urine: 1.017 (ref 1.005–1.030)
pH: 6 (ref 5.0–8.0)

## 2021-04-10 LAB — COMPREHENSIVE METABOLIC PANEL
ALT: 266 U/L — ABNORMAL HIGH (ref 0–44)
AST: 841 U/L — ABNORMAL HIGH (ref 15–41)
Albumin: 2.8 g/dL — ABNORMAL LOW (ref 3.5–5.0)
Alkaline Phosphatase: 175 U/L — ABNORMAL HIGH (ref 38–126)
Anion gap: 12 (ref 5–15)
BUN: 7 mg/dL (ref 6–20)
CO2: 25 mmol/L (ref 22–32)
Calcium: 7.9 mg/dL — ABNORMAL LOW (ref 8.9–10.3)
Chloride: 100 mmol/L (ref 98–111)
Creatinine, Ser: 0.44 mg/dL — ABNORMAL LOW (ref 0.61–1.24)
GFR, Estimated: >60 mL/min (ref >60)
Glucose, Bld: 120 mg/dL — ABNORMAL HIGH (ref 70–99)
Potassium: 3.5 mmol/L (ref 3.5–5.1)
Sodium: 137 mmol/L (ref 135–145)
Total Bilirubin: 12.8 mg/dL — ABNORMAL HIGH (ref 0.3–1.2)
Total Protein: 6.2 g/dL — ABNORMAL LOW (ref 6.5–8.1)

## 2021-04-10 LAB — PROTIME-INR
INR: 1.4 — ABNORMAL HIGH (ref 0.8–1.2)
Prothrombin Time: 16.9 seconds — ABNORMAL HIGH (ref 11.4–15.2)

## 2021-04-10 LAB — PHOSPHORUS: Phosphorus: 3.2 mg/dL (ref 2.5–4.6)

## 2021-04-10 LAB — AMMONIA: Ammonia: 75 umol/L — ABNORMAL HIGH (ref 9–35)

## 2021-04-10 LAB — HIV ANTIBODY (ROUTINE TESTING W REFLEX): HIV Screen 4th Generation wRfx: NONREACTIVE

## 2021-04-10 LAB — MAGNESIUM: Magnesium: 1.8 mg/dL (ref 1.7–2.4)

## 2021-04-10 LAB — FOLATE: Folate: 4.3 ng/mL — ABNORMAL LOW (ref >5.9)

## 2021-04-10 LAB — VITAMIN B12: Vitamin B-12: 1203 pg/mL — ABNORMAL HIGH (ref 180–914)

## 2021-04-10 LAB — DIGOXIN LEVEL: Digoxin Level: 0.8 ng/mL (ref 0.8–2.0)

## 2021-04-10 MED ORDER — ONDANSETRON HCL 4 MG/2ML IJ SOLN: 4.0000 mg | Freq: Four times a day (QID) | INTRAMUSCULAR | Status: DC | PRN

## 2021-04-10 MED ORDER — ENOXAPARIN SODIUM 40 MG/0.4ML IJ SOSY
40.0000 mg | PREFILLED_SYRINGE | INTRAMUSCULAR | Status: DC
  Administered 2021-04-10: 40 mg via SUBCUTANEOUS
  Filled 2021-04-10: qty 0.4

## 2021-04-10 MED ORDER — ONDANSETRON HCL 4 MG PO TABS
4.0000 mg | ORAL_TABLET | Freq: Four times a day (QID) | ORAL | Status: DC | PRN
  Administered 2021-04-10 – 2021-04-12 (×2): 4 mg via ORAL
  Filled 2021-04-10 (×2): qty 1

## 2021-04-10 MED ORDER — SODIUM CHLORIDE 0.9 % IV BOLUS: 500.0000 mL | Freq: Once | INTRAVENOUS | Status: DC

## 2021-04-10 MED ORDER — LACTULOSE 10 GM/15ML PO SOLN
20.0000 g | Freq: Every day | ORAL | Status: DC
  Administered 2021-04-10 – 2021-04-14 (×5): 20 g via ORAL
  Filled 2021-04-10 (×5): qty 30

## 2021-04-10 MED ORDER — METOPROLOL SUCCINATE ER 50 MG PO TB24
50.0000 mg | ORAL_TABLET | Freq: Every day | ORAL | Status: DC
  Administered 2021-04-10 – 2021-04-14 (×5): 50 mg via ORAL
  Filled 2021-04-10 (×5): qty 1

## 2021-04-10 MED ORDER — LORAZEPAM 2 MG/ML IJ SOLN
0.0000 mg | Freq: Two times a day (BID) | INTRAMUSCULAR | Status: AC
  Administered 2021-04-12 – 2021-04-13 (×2): 2 mg via INTRAVENOUS
  Filled 2021-04-10 (×2): qty 1

## 2021-04-10 MED ORDER — DIGOXIN 125 MCG PO TABS
0.1250 mg | ORAL_TABLET | Freq: Every day | ORAL | Status: DC
  Administered 2021-04-10 – 2021-04-14 (×5): 0.125 mg via ORAL
  Filled 2021-04-10 (×5): qty 1

## 2021-04-10 MED ORDER — KCL IN DEXTROSE-NACL 20-5-0.9 MEQ/L-%-% IV SOLN
INTRAVENOUS | Status: DC
  Filled 2021-04-10 (×3): qty 1000

## 2021-04-10 MED ORDER — CHLORDIAZEPOXIDE HCL 25 MG PO CAPS
25.0000 mg | ORAL_CAPSULE | Freq: Three times a day (TID) | ORAL | Status: DC
  Administered 2021-04-10 – 2021-04-14 (×13): 25 mg via ORAL
  Filled 2021-04-10 (×13): qty 1

## 2021-04-10 MED ORDER — LORAZEPAM 2 MG/ML IJ SOLN
1.0000 mg | INTRAMUSCULAR | Status: AC | PRN
  Administered 2021-04-12 (×2): 4 mg via INTRAVENOUS
  Filled 2021-04-10: qty 1
  Filled 2021-04-10 (×2): qty 2

## 2021-04-10 MED ORDER — LORAZEPAM 1 MG PO TABS
1.0000 mg | ORAL_TABLET | ORAL | Status: AC | PRN
  Administered 2021-04-10: 1 mg via ORAL
  Administered 2021-04-10 (×4): 2 mg via ORAL
  Administered 2021-04-10: 1 mg via ORAL
  Administered 2021-04-11 – 2021-04-12 (×6): 2 mg via ORAL
  Administered 2021-04-12: 09:00:00 3 mg via ORAL
  Filled 2021-04-10: qty 1
  Filled 2021-04-10 (×3): qty 2
  Filled 2021-04-10: qty 1
  Filled 2021-04-10: qty 3
  Filled 2021-04-10 (×5): qty 2
  Filled 2021-04-10: qty 1
  Filled 2021-04-10 (×2): qty 2

## 2021-04-10 MED ORDER — ACETAMINOPHEN 650 MG RE SUPP: 650.0000 mg | Freq: Four times a day (QID) | RECTAL | Status: DC | PRN

## 2021-04-10 MED ORDER — MAGNESIUM SULFATE 4 GM/100ML IV SOLN
4.0000 g | Freq: Once | INTRAVENOUS | Status: AC
  Administered 2021-04-10: 4 g via INTRAVENOUS
  Filled 2021-04-10: qty 100

## 2021-04-10 MED ORDER — PANTOPRAZOLE SODIUM 40 MG IV SOLR
40.0000 mg | Freq: Two times a day (BID) | INTRAVENOUS | Status: DC
  Administered 2021-04-10 – 2021-04-14 (×8): 40 mg via INTRAVENOUS
  Filled 2021-04-10 (×8): qty 40

## 2021-04-10 MED ORDER — POTASSIUM CHLORIDE IN NACL 20-0.9 MEQ/L-% IV SOLN
INTRAVENOUS | Status: DC
  Filled 2021-04-10: qty 1000

## 2021-04-10 MED ORDER — IBUPROFEN 400 MG PO TABS
600.0000 mg | ORAL_TABLET | Freq: Three times a day (TID) | ORAL | Status: DC | PRN
  Administered 2021-04-11 – 2021-04-13 (×5): 600 mg via ORAL
  Filled 2021-04-10 (×5): qty 2

## 2021-04-10 MED ORDER — CHLORHEXIDINE GLUCONATE CLOTH 2 % EX PADS
6.0000 | MEDICATED_PAD | Freq: Every day | CUTANEOUS | Status: DC
  Administered 2021-04-10 – 2021-04-13 (×4): 6 via TOPICAL

## 2021-04-10 MED ORDER — ACETAMINOPHEN 325 MG PO TABS: 650.0000 mg | ORAL_TABLET | Freq: Four times a day (QID) | ORAL | Status: DC | PRN

## 2021-04-10 MED ORDER — NICOTINE 21 MG/24HR TD PT24
21.0000 mg | MEDICATED_PATCH | Freq: Every day | TRANSDERMAL | Status: DC
  Administered 2021-04-10 – 2021-04-14 (×5): 21 mg via TRANSDERMAL
  Filled 2021-04-10 (×5): qty 1

## 2021-04-10 MED ORDER — IBUPROFEN 400 MG PO TABS: 600.0000 mg | ORAL_TABLET | Freq: Four times a day (QID) | ORAL | Status: DC | PRN

## 2021-04-10 MED ORDER — FOLIC ACID 1 MG PO TABS
1.0000 mg | ORAL_TABLET | Freq: Every day | ORAL | Status: DC
  Administered 2021-04-10 – 2021-04-14 (×5): 1 mg via ORAL
  Filled 2021-04-10 (×5): qty 1

## 2021-04-10 MED ORDER — POLYETHYLENE GLYCOL 3350 17 G PO PACK: 17.0000 g | PACK | Freq: Every day | ORAL | Status: DC | PRN

## 2021-04-10 MED ORDER — METOPROLOL SUCCINATE ER 50 MG PO TB24: 100.0000 mg | ORAL_TABLET | Freq: Every day | ORAL | Status: DC

## 2021-04-10 MED ORDER — KCL IN DEXTROSE-NACL 20-5-0.9 MEQ/L-%-% IV SOLN: INTRAVENOUS | Status: DC

## 2021-04-10 MED ORDER — PREDNISOLONE 5 MG PO TABS
40.0000 mg | ORAL_TABLET | Freq: Every day | ORAL | Status: DC
  Filled 2021-04-10 (×3): qty 8

## 2021-04-10 MED ORDER — SODIUM CHLORIDE 0.9 % IV BOLUS
1000.0000 mL | Freq: Once | INTRAVENOUS | Status: AC
  Administered 2021-04-10: 1000 mL via INTRAVENOUS

## 2021-04-10 MED ORDER — ADULT MULTIVITAMIN W/MINERALS CH
1.0000 | ORAL_TABLET | Freq: Every day | ORAL | Status: DC
  Administered 2021-04-10 – 2021-04-14 (×5): 1 via ORAL
  Filled 2021-04-10 (×5): qty 1

## 2021-04-10 MED ORDER — LORAZEPAM 2 MG/ML IJ SOLN
0.0000 mg | Freq: Four times a day (QID) | INTRAMUSCULAR | Status: AC
Start: 2021-04-10 — End: 2021-04-12
  Administered 2021-04-10 (×3): 1 mg via INTRAVENOUS
  Administered 2021-04-11: 2 mg via INTRAVENOUS
  Filled 2021-04-10 (×3): qty 1

## 2021-04-10 NOTE — Consult Note (Addendum)
_0 @   Referring Provider: [facility].  Primary Care Physician:  [facility] Primary Gastroenterologist:  Dr. Jenetta Downer (previously unassigned)  Date of Admission: 04/09/21 Date of Consultation: 04/10/21  Reason for Consultation:  Elevated LFTs, hyperbilirubinemia, suspect alcoholic hepatitis.   HPI:  Omar Mccarthy is a 30 y.o. year old male with medical history significant for alcohol abuse and intoxication, elevated LFTs, depression and anxiety, atrial fibrillation in setting of alcohol intoxication, respiratory failure requiring intubation, HTN, who presented to the emergency room for evaluation of jaundice, abdominal pain, nausea.  He was initially seen by his cardiologist 3 days ago on Friday and was found to be jaundiced, had blood work and was told that he was in liver failure.  He was told to come to the ED, but reports due to personal matters, he came to the ED yesterday.  ED Course: Temperature 98.8.  Heart rate 88- 94.  Respiratory rate 14-20.  Blood pressure systolic 098-119.  O2 sats greater than 97% on room air.  Markedly elevated liver enzymes, AST 922, ALT 307, ALP 180, total bilirubin 14.3.  INR 1.4.  Ammonia level 57.  BNP 58.  Chest x-ray without acute abnormality.  Blood alcohol level markedly elevated at 491.   UDS positive for opiates.  Reports Tylenol level less than 10. Acute hepatitis panel in process. Due to DF > 32, he was started on prednisolone 40 mg daily.   Today:  Patient reports he was told he was jaundiced by his cardiologist on Friday and advised to come to the emergency room.  He took a couple of days to get his "mind right", then presented to the emergency room. Drinks 1/4 gallon of vodka daily, last drink about 1 hour prior to arrival to the ED.  History of withdrawals when trying to stop in the past. Was sober for 10 months until going to the beach in June.  Thought he would just drink for the weekend, but has not quit  drinking since.  Denies swelling in his abdomen or lower extremities.  Has been Lasix previously, but has not needed this in a while.  Reports easy bruising and also had a couple of nosebleeds a couple of months ago.  This was new for him.  After this, he stopped Eliquis.  Denies changes in mental status or confusion.  He is alert and oriented x4 today.  He does have some trembling which she reports is secondary to coming off of alcohol.  Reports chronic history of intermittent nausea and vomiting since the age of 69.  Nausea is at least daily.  He can skip a couple weeks between episodes of vomiting.  Denies hematemesis.  Symptoms are typically triggered by alcohol, lack of alcohol, or the food he eats.  Worse with greasy items.  Also with chronic history of GERD.  Currently with burning in his chest every time he eats.  Previously on Protonix, but has been out for couple of months.  Also reports history of intermittent upper abdominal pain for the last couple of years though not very often.  Maybe once a month or less.  Denies right-sided pain.  Denies any abdominal pain at this time.  Also reports dark green/black stools since July occurring several days per week.  Denies iron or Pepto-Bismol.  Denies bright red blood per rectum.  No prior EGD or colonoscopy.  Also noted change in bowel habits, now with some urgency.  Total of about 3 BMs per day with mushy  stools.  No BM today. No watery diarrhea.  No recent antibiotics.  No abdominal pain prior to bowel movements.  Denies NSAIDs. Takes his friends Vicodin for back pain intermittently.   Mom had several cousins with alcohol cirrhosis.  No personal or family history of autoimmune conditions.  Declined rectal exam. Not interested in EGD.  Past Medical History:  Diagnosis Date   Atrial fibrillation [facility])    a. diagnosed in 12/2019   Hypertension     Past Surgical History:  Procedure Laterality Date   CARDIOVERSION N/A 02/24/2020   Procedure:  CARDIOVERSION;  Surgeon: Satira Sark, MD;  Location: [facility];  Service: Cardiovascular;  Laterality: N/A;    Prior to Admission medications   Medication Sig Start Date End Date Taking? Authorizing Provider  acetaminophen (TYLENOL) 500 MG tablet Take 1,000 mg by mouth every 6 (six) hours as needed.   Yes [provider]  digoxin (LANOXIN) 0.125 MG tablet Take 1 tablet (0.125 mg total) by mouth daily. 04/06/21  Yes Verta Ellen., NP  metoprolol succinate (TOPROL-XL) 100 MG 24 hr tablet TAKE ONE TABLET BY MOUTH DAILY WITH OR IMMEDIATELY FOLLOWING A MEAL 04/06/21  Yes Verta Ellen., NP  apixaban (ELIQUIS) 5 MG TABS tablet Take 1 tablet (5 mg total) by mouth 2 (two) times daily. Patient not taking: No sig reported 02/17/20   Erma Heritage, PA-C  furosemide (LASIX) 20 MG tablet Take 1 tablet (20 mg total) by mouth as needed for edema. Patient not taking: No sig reported 02/17/20 05/17/20  Erma Heritage, PA-C  Multiple Vitamin (MULTIVITAMIN WITH MINERALS) TABS tablet Take 1 tablet by mouth daily. Patient not taking: No sig reported 01/23/20   Roxan Hockey, MD  pantoprazole (PROTONIX) 40 MG tablet Take 1 tablet (40 mg total) by mouth daily. Pt needs to keep upcoming appt in Sept for further refills Patient not taking: No sig reported 01/26/21   Ahmed Prima, Fransisco Hertz, PA-C  thiamine 100 MG tablet Take 1 tablet (100 mg total) by mouth daily. Patient not taking: No sig reported 01/23/20   Roxan Hockey, MD    Current Facility-Administered Medications  Medication Dose Route Frequency Provider Last Rate Last Admin   0.9 % NaCl with KCl 20 mEq/ L  infusion   Intravenous Continuous Wynetta Emery, Clanford L, MD 100 mL/hr at 04/10/21 0755 New Bag at 04/10/21 0755   chlordiazePOXIDE (LIBRIUM) capsule 25 mg  25 mg Oral TID Wynetta Emery, Clanford L, MD       digoxin (LANOXIN) tablet 0.125 mg  0.125 mg Oral Daily Emokpae, Ejiroghene E, MD   0.125 mg at 04/10/21 0752    enoxaparin (LOVENOX) injection 40 mg  40 mg Subcutaneous Q24H Emokpae, Ejiroghene E, MD   40 mg at 62/83/15 1761   folic acid (FOLVITE) tablet 1 mg  1 mg Oral Daily Emokpae, Ejiroghene E, MD   1 mg at 04/10/21 0752   ibuprofen (ADVIL) tablet 600 mg  600 mg Oral Q8H PRN Johnson, Clanford L, MD       LORazepam (ATIVAN) injection 0-4 mg  0-4 mg Intravenous Q6H Emokpae, Ejiroghene E, MD   1 mg at 04/10/21 6073   Followed by   Derrill Memo ON 04/12/2021] LORazepam (ATIVAN) injection 0-4 mg  0-4 mg Intravenous Q12H Emokpae, Ejiroghene E, MD       LORazepam (ATIVAN) tablet 1-4 mg  1-4 mg Oral Q1H PRN Emokpae, Ejiroghene E, MD       Or   LORazepam (ATIVAN)  injection 1-4 mg  1-4 mg Intravenous Q1H PRN Emokpae, Ejiroghene E, MD       magnesium sulfate IVPB 4 g 100 mL  4 g Intravenous Once Wynetta Emery, Clanford L, MD 50 mL/hr at 04/10/21 0811 4 g at 04/10/21 0811   metoprolol succinate (TOPROL-XL) 24 hr tablet 50 mg  50 mg Oral Daily Johnson, Clanford L, MD   50 mg at 04/10/21 2244   multivitamin with minerals tablet 1 tablet  1 tablet Oral Daily Emokpae, Ejiroghene E, MD   1 tablet at 04/10/21 0752   ondansetron (ZOFRAN) tablet 4 mg  4 mg Oral Q6H PRN Emokpae, Ejiroghene E, MD       Or   ondansetron (ZOFRAN) injection 4 mg  4 mg Intravenous Q6H PRN Emokpae, Ejiroghene E, MD       polyethylene glycol (MIRALAX / GLYCOLAX) packet 17 g  17 g Oral Daily PRN Emokpae, Ejiroghene E, MD       prednisoLONE tablet 40 mg  40 mg Oral Daily Emokpae, Ejiroghene E, MD       thiamine tablet 100 mg  100 mg Oral Daily Emokpae, Ejiroghene E, MD   100 mg at 04/10/21 9753   Or   thiamine (B-1) injection 100 mg  100 mg Intravenous Daily Emokpae, Ejiroghene E, MD   100 mg at 04/09/21 2103   Current Outpatient Medications  Medication Sig Dispense Refill   acetaminophen (TYLENOL) 500 MG tablet Take 1,000 mg by mouth every 6 (six) hours as needed.     digoxin (LANOXIN) 0.125 MG tablet Take 1 tablet (0.125 mg total) by mouth daily. 90  tablet 3   metoprolol succinate (TOPROL-XL) 100 MG 24 hr tablet TAKE ONE TABLET BY MOUTH DAILY WITH OR IMMEDIATELY FOLLOWING A MEAL 90 tablet 3   apixaban (ELIQUIS) 5 MG TABS tablet Take 1 tablet (5 mg total) by mouth 2 (two) times daily. (Patient not taking: No sig reported) 60 tablet 6   furosemide (LASIX) 20 MG tablet Take 1 tablet (20 mg total) by mouth as needed for edema. (Patient not taking: No sig reported) 30 tablet 6   Multiple Vitamin (MULTIVITAMIN WITH MINERALS) TABS tablet Take 1 tablet by mouth daily. (Patient not taking: No sig reported) 30 tablet 2   pantoprazole (PROTONIX) 40 MG tablet Take 1 tablet (40 mg total) by mouth daily. Pt needs to keep upcoming appt in Sept for further refills (Patient not taking: No sig reported) 30 tablet 0   thiamine 100 MG tablet Take 1 tablet (100 mg total) by mouth daily. (Patient not taking: No sig reported) 30 tablet 1    Allergies as of 04/09/2021   (No Known Allergies)    Family History  Problem Relation Age of Onset   High blood pressure Mother    High Cholesterol Mother    Colon cancer Mother    High blood pressure Father    High Cholesterol Father     Social History   Socioeconomic History   Marital status: Married    Spouse name: Not on file   Number of children: Not on file   Years of education: Not on file   Highest education level: Not on file  Occupational History   Not on file  Tobacco Use   Smoking status: Every Day    Packs/day: 1.00    Types: Cigarettes   Smokeless tobacco: Never  Vaping Use   Vaping Use: Never used  Substance and Sexual Activity   Alcohol use: Not Currently  Comment: stopped 01/12/20   Drug use: Never   Sexual activity: Not on file  Other Topics Concern   Not on file  Social History Narrative   Not on file   Social Determinants of Health   Financial Resource Strain: Not on file  Food Insecurity: Not on file  Transportation Needs: Not on file  Physical Activity: Not on file   Stress: Not on file  Social Connections: Not on file  Intimate Partner Violence: Not on file    Review of Systems: Gen: Denies fever, chills, cold or flulike symptoms, presyncope, syncope. CV: Denies chest pain, heart palpitations. Resp: Denies shortness of breath or cough. GI: See HPI GU : Denies urinary burning, urinary frequency, urinary incontinence.  MS: Admits to chronic back pain. Derm: Denies rash. Psych: Admits to depression.  No SI. Heme: See HPI  Physical Exam: Vital signs in last 24 hours: Temp:  [98.4 F (36.9 C)-98.8 F (37.1 C)] 98.4 F (36.9 C) (11/15 0707) Pulse Rate:  [88-110] 109 (11/15 0638) Resp:  [14-22] 22 (11/15 0638) BP: (110-139)/(62-95) 136/95 (11/15 0638) SpO2:  [5 %-100 %] 97 % (11/15 1610)   General:   Alert,  Well-developed, well-nourished, pleasant and cooperative in NAD, some trembling of his hands intermittently, jaundiced.   Head:  Normocephalic and atraumatic. Eyes:  + scleral icterus.  Ears:  Normal auditory acuity. Lungs:  Clear throughout to auscultation.   No wheezes, crackles, or rhonchi. No acute distress. Heart:  Regular rate and rhythm. Systolic murmur appreciated.  Abdomen:  Soft, nontender and nondistended. No masses, hepatosplenomegaly or hernias noted. Normal bowel sounds, without guarding, and without rebound.   Rectal: Patient declined. Msk:  Symmetrical without gross deformities. Normal posture. Extremities:  Without edema. Neurologic:  Alert and  oriented x4;  grossly normal neurologically. Skin:  Intact without significant lesions or rashes. Psych:  Normal mood and affect.  Intake/Output from previous day: 11/14 0701 - 11/15 0700 In: 50 [IV Piggyback:50] Out: -  Intake/Output this shift: No intake/output data recorded.  Lab Results: Recent Labs    04/09/21 2021 04/10/21 0426  WBC 3.9* 4.7  HGB 12.6* 11.4*  HCT 36.8* 33.0*  PLT 106* 95*   BMET Recent Labs    04/09/21 2021 04/10/21 0426  NA 137 137  K  3.7 3.5  CL 98 100  CO2 23 25  GLUCOSE 120* 120*  BUN 7 7  CREATININE 0.32* 0.44*  CALCIUM 8.2* 7.9*   LFT Recent Labs    04/09/21 2021 04/10/21 0426  PROT 7.2 6.2*  ALBUMIN 3.2* 2.8*  AST 922* 841*  ALT 307* 266*  ALKPHOS 180* 175*  BILITOT 14.3* 12.8*   PT/INR Recent Labs    04/09/21 2021 04/10/21 0426  LABPROT 16.9* 16.9*  INR 1.4* 1.4*    Studies/Results: DG Chest Portable 1 View  Result Date: 04/09/2021 CLINICAL DATA:  Shortness of breath, abdominal pain EXAM: PORTABLE CHEST 1 VIEW COMPARISON:  01/16/2020 FINDINGS: Heart and mediastinal contours are within normal limits. No focal opacities or effusions. No acute bony abnormality. IMPRESSION: No active disease. Electronically Signed   By: Rolm Baptise M.D.   On: 04/09/2021 20:36    Impression: 30 year old male with medical history significant for alcohol abuse, elevated LFTs, depression/anxiety, atrial fibrillation on Eliquis and digoxin, HTN, presenting to the emergency room for further evaluation of jaundice, found to have markedly elevated LFTs, hyperbilirubinemia, GI consulted for further evaluation.  Acute on chronic elevated LFTs/hyperbilirubinemia:  Likely secondary to alcoholic hepatitis in the  setting of chronic alcohol abuse, drinking about 1/4 gallon of vodka daily. Acute hepatitis panel pending. On admission,  AST 922, ALT 307, ALP 180, total bilirubin 14.3.  INR 1.4. Labs improving today with bilirubin down to 12.8, AST 841, ALT 226, alk phos 175, INR 1.4. Ultrasound today with severe fatty infiltration of the liver, no evidence of cirrhosis, sludge in the gallbladder with mild wall thickening and small amount of pericholecystic fluid though I suspect this is reactive to acute hepatitis rather than cholecystitis as he has no significant abdominal pain at this time. He does have mildly elevated ammonia at 75 though no symptoms of hepatic encephalopathy.  No evidence of ascites or peripheral edema.  On admission,  DF 30.9 (PT control of 13.3). DF today 29.4 (PT control 13.3). MELD 20. We will hold off on prednisolone at this time as DF is less than 32. Encouragingly, liver enzymes and bilirubin are downtrending.  I will go ahead and start lactulose due to elevated ammonia to prevent hepatic encephalopathy. We will need to continue to monitor him closely. Consider additional serologic evaluation if LFTs do not continue to improve.   Thrombocytopenia: Likely secondary to chronic alcohol abuse/alcoholic hepatitis.  Continue to monitor.  GERD:  Chronic, uncontrolled, associated esophageal burning postprandially.  We will start PPI twice daily.  Chronic nausea/vomiting, intermittent epigastric pain, dark stools and anemia: Patient reports chronic history of nausea and intermittent vomiting since age 64, couple year history of intermittent epigastric pain though fairly infrequent, and 3-79-monthhistory of intermittent dark green/black stools occurring several days per week.  This is in the setting of chronic uncontrolled GERD and chronic alcohol abuse.  Denies NSAIDs.  He is supposed to be chronically anticoagulated with Eliquis, but reports he has not taken this in 2 months.  No iron or Pepto-Bismol.  Hemoglobin 12.6 with macrocytic indices on admission, hemoglobin down to 11.4 this morning though he has received IV fluids.  B12 elevated, folate low at 4.3.  Ultrasound with gallbladder sludge, mild wall thickening, and small amount of pericholecystic fluid.  His abdominal exam is benign.  Suspect findings on ultrasound are likely reactive to acute hepatitis rather than cholecystitis.   Differentials for upper GI symptoms/dark stools include uncontrolled GERD, alcohol induced esophagitis, gastritis, duodenitis, PUD.  We will start him on PPI twice daily and obtain Hemoccult.  Patient declined rectal exam today.  Discussed possibility of EGD at some point, but patient states he is not interested in this at all this  admission.  Plan: Hold off on Prednisolone as DF is <32.  Start lactulose 30 mL daily. Goal of 3-4 BMs per day.  Monitor for hepatic encephalopathy.  Start IV Protonix twice daily. Obtain Hemoccult. Monitor H&H and for overt GI bleeding. CBC, CMP, INR daily.  Continue supportive measures.  Discussed need for alcohol cessation.    LOS: 1 day    04/10/2021, 8:49 AM   KAliene Altes PA-C [facility]

## 2021-04-10 NOTE — Progress Notes (Signed)
Patient just arrive from ED & noted shaking uncontrollably, noted that patient is a high risk for DTs & intubation, MD notified regarding possible Precedex drip to help prevent further complications

## 2021-04-10 NOTE — ED Notes (Signed)
Checked on pt . Pt had a large runny BM. Put a brief on pt and a new male wick.

## 2021-04-10 NOTE — Progress Notes (Signed)
PROGRESS NOTE   Omar Mccarthy  [id number] DOB: 07-Aug-1990 DOA: 04/09/2021 PCP: [facility], [facility]   Chief Complaint  Patient presents with   Abdominal Pain   Level of care: Stepdown  Brief Admission History:  29 y/o male chronic heavy alcohol user, adderall and vicodin user, cardiomyopathy (EF 40%), history of atrial fibrillation s/p DCCV in 2021, stopped taking apixaban, history of severe alcohol withdrawal requiring intubation, mostly lost to follow up presented to his cardiologist after 1 year and was noted to be severely jaundiced and was advised to go to the ED.  Pt came to ED 3 days later.  He was noted to have severely elevated bilirubin and elevated LFTs.  He admitted to consuming at least 1/2 gallon of vodka daily.  He was admitted for presumed acute alcoholic hepatitis.    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    Hyperammonemia (HCC)   SEVERE Hepatic steatosis   HFrEF (heart failure with reduced ejection fraction) (HCC)   Elevated transaminase level   Paroxysmal atrial fibrillation (HCC)   Ethanol causing toxic effect (HCC)   Hyperglycemia   Hypoalbuminemia   Anemia, chronic disease   Hypomagnesemia   Thrombocytopenia (HCC)   Sinus tachycardia   Severe protein-calorie malnutrition (HCC)   Elevated INR   Elevated alkaline phosphatase level   Acute liver failure   Chronic pain   Anxiety and depression   Acute alcohol induced hepatitis  - pt endorses drinking at least 1/2 gallon of vodka daily  - he presented with markedly elevated serum ethanol levels - he endorses abusing acetaminophen containing vicodin tablets and adderall - his PT/INR is elevated - his platelets are low -  his ammonia is elevated  - he is high risk for adverse outcome and I will place him in stepdown ICU for closer monitoring - GI team consulted and starting prednisolone   Paroxysmal atrial fibrillation - he was treated with DCCV and coverted to NSR in 2021 - he has stopped taking apixaban at some point due to a nosebleed - he has not been fully compliant with metoprolol but has taken it intermittently - Pt likely not a candidate for full anticoagulation due to history of poor compliance, polysubstance abuse, frequent falls, etc.   Cardiomyopathy / HFrEF  - most recent echo 01/13/20 demonstrated Left ventricular ejection fraction, by estimation, is approximately 40%. The left ventricle has moderately decreased function. The left ventricle demonstrates global hypokinesis with some regionla variation. There is moderate left ventricular hypertrophy. Left ventricular diastolic parameters are indeterminate in the setting of atrial fibrillation. No obvious LV mural thrombus. - repeat 2D echo to follow up cardiomyopathy - he appears to be compensated at this time, follow closely  - continue metoprolol and digoxin.  Check digoxin level.    Heart Murmur - Pt reports no prior history - check 2D echocardiogram - check EKG 12 lead   Thrombocytopenia  - secondary to chronic liver disease - hold all heparins  - monitor CBC  Macrocytic Anemia - baseline Hg unknown - B12 is within normal limits - replacing low folic acid level - follow CBC  Hypomagnesemia - IV replacement ordered - recheck in AM   Hyperbilirubinemia - secondary to acute hepatitis  -  GI team starting prednisolone  Chronic heavy alcohol abuse  - CIWA protocol ordered - Monitor in stepdown ICU - add librium 25 mg TID - supplement folic acid, thiamine and MVI - HIGH RISK FOR WITHDRAWAL AND INTUBATION   Chronic pain  - ibuprofen ordered - avoiding acetaminophen   Polysubstance abuse - pt admits to abusing non prescribed  adderall and vicodin - monitor for withdrawal symptoms    DVT prophylaxis:  SCD Code Status: Full  Family Communication: discussed with patient at bedside  Disposition: admit to stepdown ICU  Status is: Inpatient  Remains inpatient appropriate because: IV treatments required, high risk for rapid decompensation   Consultants:  GI   Procedures:    Antimicrobials:     Subjective: Pt reports that he is having alcohol withdrawal symptoms already.  He feels very bad.  He has palpitations. He denies CP and SOB.   Objective: Vitals:   04/10/21 0230 04/10/21 0330 04/10/21 0638 04/10/21 0707  BP: 116/67 110/65 (!) 136/95   Pulse: 91  (!) 109   Resp: (!) 21 (!) 21 (!) 22   Temp:    98.4 F (36.9 C)  TempSrc:      SpO2: 100% 100% 97%     Intake/Output Summary (Last 24 hours) at 04/10/2021 0925 Last data filed at 04/10/2021 0143 Gross per 24 hour  Intake 50 ml  Output --  Net 50 ml   There were no vitals filed for this visit.  Examination:  General exam: chronically ill appearing, very jaundiced with severe scleral icterus. Severe dental caries and gingivitis.   Respiratory system: BBS CTA. Respiratory effort normal. Cardiovascular system: normal S1 & S2 heard with loud holosystolic murmurs, rubs, gallops or clicks. No pedal edema. Gastrointestinal system: Abdomen is nondistended, soft and nontender. Hepatomegaly present, no masses felt. Normal bowel sounds heard. Central nervous system: Alert and oriented. No focal neurological deficits. Extremities: Symmetric 5 x 5 power. Skin: jaundiced all over, bruise on left knee from recent fall, scabs on legs from recent falls.  Psychiatry: Judgement and insight appear poor. Mood & affect flat.   Data Reviewed: I have personally reviewed following labs and imaging studies  CBC: Recent Labs  Lab 04/09/21 2021 04/10/21 0426  WBC 3.9* 4.7  NEUTROABS 2.4  --   HGB 12.6* 11.4*  HCT 36.8* 33.0*  MCV 103.7* 104.1*  PLT 106* 95*     Basic Metabolic Panel: Recent Labs  Lab 04/09/21 2021 04/10/21 0426  NA 137 137  K 3.7 3.5  CL 98 100  CO2 23 25  GLUCOSE 120* 120*  BUN 7 7  CREATININE 0.32* 0.44*  CALCIUM 8.2* 7.9*  MG 1.6* 1.8  PHOS 3.2  --     GFR: Estimated Creatinine Clearance: 155.2 mL/min (A) (by C-G formula based on SCr of 0.44 mg/dL (L)).  Liver Function Tests: Recent Labs  Lab 04/09/21 2021 04/10/21 0426  AST 922* 841*  ALT 307* 266*  ALKPHOS 180* 175*  BILITOT 14.3* 12.8*  PROT 7.2 6.2*  ALBUMIN 3.2* 2.8*    CBG: No results for input(s): GLUCAP in the last 168 hours.  Recent Results (from the past 240 hour(s))  Resp Panel by RT-PCR (Flu A&B, Covid) Nasopharyngeal Swab     Status: None   Collection Time: 04/09/21  8:29 PM   Specimen: Nasopharyngeal Swab; Nasopharyngeal(NP) swabs in vial transport medium  Result Value Ref Range Status   SARS Coronavirus 2 by RT PCR NEGATIVE NEGATIVE Final    Comment: (NOTE)  SARS-CoV-2 target nucleic acids are NOT DETECTED.  The SARS-CoV-2 RNA is generally detectable in upper respiratory specimens during the acute phase of infection. The lowest concentration of SARS-CoV-2 viral copies this assay can detect is 138 copies/mL. A negative result does not preclude SARS-Cov-2 infection and should not be used as the sole basis for treatment or other patient management decisions. A negative result may occur with  improper specimen collection/handling, submission of specimen other than nasopharyngeal swab, presence of viral mutation(s) within the areas targeted by this assay, and inadequate number of viral copies(<138 copies/mL). A negative result must be combined with clinical observations, patient history, and epidemiological information. The expected result is Negative.  Fact Sheet for Patients:  EntrepreneurPulse.com.au  Fact Sheet for Healthcare Providers:  IncredibleEmployment.be  This test is no t yet  approved or cleared by the [place] FDA and  has been authorized for detection and/or diagnosis of SARS-CoV-2 by FDA under an Emergency Use Authorization (EUA). This EUA will remain  in effect (meaning this test can be used) for the duration of the COVID-19 declaration under Section 564(b)(1) of the Act, 21 U.S.C.section 360bbb-3(b)(1), unless the authorization is terminated  or revoked sooner.       Influenza A by PCR NEGATIVE NEGATIVE Final   Influenza B by PCR NEGATIVE NEGATIVE Final    Comment: (NOTE) The Xpert Xpress SARS-CoV-2/FLU/RSV plus assay is intended as an aid in the diagnosis of influenza from Nasopharyngeal swab specimens and should not be used as a sole basis for treatment. Nasal washings and aspirates are unacceptable for Xpert Xpress SARS-CoV-2/FLU/RSV testing.  Fact Sheet for Patients: EntrepreneurPulse.com.au  Fact Sheet for Healthcare Providers: IncredibleEmployment.be  This test is not yet approved or cleared by the [place] FDA and has been authorized for detection and/or diagnosis of SARS-CoV-2 by FDA under an Emergency Use Authorization (EUA). This EUA will remain in effect (meaning this test can be used) for the duration of the COVID-19 declaration under Section 564(b)(1) of the Act, 21 U.S.C. section 360bbb-3(b)(1), unless the authorization is terminated or revoked.  Performed at [facility], [address], [place],  [address]      Radiology Studies: DG Chest Portable 1 View  Result Date: 04/09/2021 CLINICAL DATA:  Shortness of breath, abdominal pain EXAM: PORTABLE CHEST 1 VIEW COMPARISON:  01/16/2020 FINDINGS: Heart and mediastinal contours are within normal limits. No focal opacities or effusions. No acute bony abnormality. IMPRESSION: No active disease. Electronically Signed   By: Rolm Baptise M.D.   On: 04/09/2021 20:36   US Abdomen Limited RUQ (LIVER/GB)  Result Date: 04/10/2021 CLINICAL  DATA:  Increased bilirubin for 2 days EXAM: ULTRASOUND ABDOMEN LIMITED RIGHT UPPER QUADRANT COMPARISON:  Abdominal ultrasound 01/12/2020 FINDINGS: Gallbladder: There is layering sludge in the gallbladder. There is mild gallbladder wall thickening and small amount of pericholecystic fluid. No shadowing stones are seen. Sonographic Percell Miller sign was reported negative Common bile duct: Diameter: 4 mm.  There is no intrahepatic biliary ductal dilatation. Liver: Parenchymal echogenicity is diffusely increased throughout the liver. No focal lesions are seen. Portal vein is patent on color Doppler imaging with normal direction of blood flow towards the liver. Other: None. IMPRESSION: 1. Layering sludge in the gallbladder with mild gallbladder wall thickening and small amount of pericholecystic fluid. Sonographic Percell Miller sign was reported negative. If there is clinical concern for acute cholecystitis, HIDA scan may be obtained. 2. No intra or extrahepatic biliary ductal dilatation. 3. Severe fatty infiltration of the liver. Electronically Signed  By: Lesia Hausen M.D.   On: 04/10/2021 09:07    Scheduled Meds:  chlordiazePOXIDE  25 mg Oral TID   digoxin  0.125 mg Oral Daily   enoxaparin (LOVENOX) injection  40 mg Subcutaneous Q24H   folic acid  1 mg Oral Daily   LORazepam  0-4 mg Intravenous Q6H   Followed by   Melene Muller ON 04/12/2021] LORazepam  0-4 mg Intravenous Q12H   metoprolol succinate  50 mg Oral Daily   multivitamin with minerals  1 tablet Oral Daily   prednisoLONE  40 mg Oral Daily   thiamine  100 mg Oral Daily   Or   thiamine  100 mg Intravenous Daily   Continuous Infusions:  0.9 % NaCl with KCl 20 mEq / L 100 mL/hr at 04/10/21 0755   magnesium sulfate bolus IVPB 4 g (04/10/21 0811)     LOS: 1 day   Critical Care Procedure Note Authorized and Performed by: Maryln Manuel MD  Total Critical Care time:  44 mins Due to a high probability of clinically significant, life threatening deterioration, the  patient required my highest level of preparedness to intervene emergently and I personally spent this critical care time directly and personally managing the patient.  This critical care time included obtaining a history; examining the patient, pulse oximetry; ordering and review of studies; arranging urgent treatment with development of a management plan; evaluation of patient's response of treatment; frequent reassessment; and discussions with other providers.  This critical care time was performed to assess and manage the high probability of imminent and life threatening deterioration that could result in multi-organ failure.  It was exclusive of separately billable procedures and treating other patients and teaching time.     Omar Dakins, MD How to contact the [facility] Attending or Consulting provider 7A - 7P or covering provider during after hours 7P -7A, for this patient?  Check the care team in [facility] and look for a) attending/consulting [facility] provider listed and b) the [facility] team listed Log into [email or web address] and use [facility]'s universal password to access. If you do not have the password, please contact the hospital operator. Locate the [facility] provider you are looking for under [facility] and page to a number that you can be directly reached. If you still have difficulty reaching the provider, please page the [facility] (Director on Call) for the Hospitalists listed on amion for assistance.  04/10/2021, 9:25 AM

## 2021-04-10 NOTE — Progress Notes (Signed)
*  PRELIMINARY RESULTS* Echocardiogram 2D Echocardiogram has been performed.  Omar Mccarthy 04/10/2021, 2:12 PM

## 2021-04-10 NOTE — ED Notes (Signed)
Changed  and cleaned pt again. Pt had another large runny BM.

## 2021-04-11 DIAGNOSIS — R7401 Elevation of levels of liver transaminase levels: Secondary | ICD-10-CM

## 2021-04-11 DIAGNOSIS — E8809 Other disorders of plasma-protein metabolism, not elsewhere classified: Secondary | ICD-10-CM

## 2021-04-11 DIAGNOSIS — D696 Thrombocytopenia, unspecified: Secondary | ICD-10-CM

## 2021-04-11 DIAGNOSIS — G894 Chronic pain syndrome: Secondary | ICD-10-CM

## 2021-04-11 LAB — COMPREHENSIVE METABOLIC PANEL
ALT: 225 U/L — ABNORMAL HIGH (ref 0–44)
AST: 615 U/L — ABNORMAL HIGH (ref 15–41)
Albumin: 2.7 g/dL — ABNORMAL LOW (ref 3.5–5.0)
Alkaline Phosphatase: 150 U/L — ABNORMAL HIGH (ref 38–126)
Anion gap: 9 (ref 5–15)
BUN: 6 mg/dL (ref 6–20)
CO2: 22 mmol/L (ref 22–32)
Calcium: 8 mg/dL — ABNORMAL LOW (ref 8.9–10.3)
Chloride: 102 mmol/L (ref 98–111)
Creatinine, Ser: <0.3 mg/dL — ABNORMAL LOW (ref 0.61–1.24)
Glucose, Bld: 85 mg/dL (ref 70–99)
Potassium: 3.8 mmol/L (ref 3.5–5.1)
Sodium: 133 mmol/L — ABNORMAL LOW (ref 135–145)
Total Bilirubin: 15 mg/dL — ABNORMAL HIGH (ref 0.3–1.2)
Total Protein: 6 g/dL — ABNORMAL LOW (ref 6.5–8.1)

## 2021-04-11 LAB — CBC
HCT: 31 % — ABNORMAL LOW (ref 39.0–52.0)
Hemoglobin: 10.4 g/dL — ABNORMAL LOW (ref 13.0–17.0)
MCH: 35 pg — ABNORMAL HIGH (ref 26.0–34.0)
MCHC: 33.5 g/dL (ref 30.0–36.0)
MCV: 104.4 fL — ABNORMAL HIGH (ref 80.0–100.0)
Platelets: 75 10*3/uL — ABNORMAL LOW (ref 150–400)
RBC: 2.97 MIL/uL — ABNORMAL LOW (ref 4.22–5.81)
RDW: 12.9 % (ref 11.5–15.5)
WBC: 3.9 10*3/uL — ABNORMAL LOW (ref 4.0–10.5)
nRBC: 0 % (ref 0.0–0.2)

## 2021-04-11 LAB — PROTIME-INR
INR: 1.5 — ABNORMAL HIGH (ref 0.8–1.2)
Prothrombin Time: 17.6 seconds — ABNORMAL HIGH (ref 11.4–15.2)

## 2021-04-11 LAB — AMMONIA: Ammonia: 56 umol/L — ABNORMAL HIGH (ref 9–35)

## 2021-04-11 LAB — MRSA NEXT GEN BY PCR, NASAL: MRSA by PCR Next Gen: NOT DETECTED

## 2021-04-11 LAB — MAGNESIUM: Magnesium: 1.8 mg/dL (ref 1.7–2.4)

## 2021-04-11 LAB — FERRITIN: Ferritin: 2869 ng/mL — ABNORMAL HIGH (ref 24–336)

## 2021-04-11 MED ORDER — PREDNISOLONE 5 MG PO TABS
40.0000 mg | ORAL_TABLET | Freq: Every day | ORAL | Status: DC
  Administered 2021-04-11 – 2021-04-14 (×4): 40 mg via ORAL
  Filled 2021-04-11 (×6): qty 8

## 2021-04-11 NOTE — Progress Notes (Signed)
PROGRESS NOTE  Omar Mccarthy WVP:[id number] DOB: [birth year] DOA: 04/09/2021 PCP: [facility], [facility]  Brief History:   30 y.o. male with medical history significant for alcohol abuse and intoxication, depression and anxiety, atrial fibrillation in the setting of alcohol intoxication, respiratory failure requiring intubation.   Patient presented to the ED reports of jaundice.  Patient went to see his cardiologist 3 days ago on Friday, he was found to be jaundiced, he had some blood work done and was told he was in liver failure  and was told to the ED, he reports due to personal matters he came to the ED 04/09/21.  He reports some difficulty breathing sometimes at rest and sometimes with exertion. He endorses significant alcohol intake, half a gallon of vodka every day.  His last drink was 1 hour prior to arrival in the ED this evening.  He reports he has been drinking due to his depression and is afraid to stop because he has gone through withdrawals in the past.  He endorses taking Vicodin 10 mg every day for chronic pain, this is not the prescribed medication.  He also takes Adderall also not prescribed.  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  Heart rate 88- 94.  Respiratory rate 14-20.  Blood pressure systolic 125-139.  O2 sats greater than 97% on room air.  Markedly elevated liver enzymes, AST 922, ALT 307, ALP 180, total bilirubin 14.3.  Ammonia level 57.  BNP 58.  Chest x-ray without acute abnormality.  Blood alcohol level markedly elevated at 491.  Hepatitis panel ordered and pending. EDP talked to GI, recommended admission, ultrasound    Assessment/Plan: Acute alcoholic hepatitis with alcohol intoxication -appreciate GI consult and follow up -pt was started on prednisone -continue IVF -RUQ US--GB sludge with mild GB thickening with small amount pericholecystic fluid; neg Murphy's sign  Chronic  HFrEF -clinically compensated -04/10/21 Echo EF 70-75%, hypertrophic CM -01/13/20 Echo EF 40%  PAF -s/p DCCV 01/2020 -remains in sinus -poor candidate for anticoagulation in setting of Etoh abuse and hepatitis  Thrombocytopenia -due to myelosuppression from Etoh -B12--1203 -folate--4.3>>replete -TSH  Alcohol Abuse -CIWA -cessation discussed  Hypomagnesemia -repleted  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  Patient denies fevers, chills, headache, chest pain, dyspnea, nausea, vomiting, diarrhea, abdominal pain, dysuria, hematuria, hematochezia, and melena.   Objective: Vitals:   04/11/21 0326 04/11/21 0400 04/11/21 1100 04/11/21 1147  BP:  (!) 161/88 (!) 158/115   Pulse:  88 86   Resp:  13    Temp: 99.1 F (37.3 C)   98.7 F (37.1 C)  TempSrc: Oral   Oral  SpO2:      Weight:      Height:        Intake/Output Summary (Last 24 hours) at 04/11/2021 1550 Last data filed at 04/11/2021 5462 Gross per 24  hour  Intake 2512.2 ml  Output 750 ml  Net 1762.2 ml   Weight change:  Exam:  General:  Pt is alert, follows commands appropriately, not in acute distress HEENT: No icterus, No thrush, No neck mass, [place]/AT Cardiovascular: RRR, S1/S2, no rubs, no gallops Respiratory: CTA bilaterally, no wheezing, no crackles, no rhonchi Abdomen: Soft/+BS, non tender, non distended, no  guarding Extremities: No edema, No lymphangitis, No petechiae, No rashes, no synovitis   Data Reviewed: I have personally reviewed following labs and imaging studies Basic Metabolic Panel: Recent Labs  Lab 04/09/21 2021 04/10/21 0426 04/11/21 0420  NA 137 137 133*  K 3.7 3.5 3.8  CL 98 100 102  CO2 23 25 22   GLUCOSE 120* 120* 85  BUN 7 7 6   CREATININE 0.32* 0.44* <0.30*  CALCIUM 8.2* 7.9* 8.0*  MG 1.6* 1.8 1.8  PHOS 3.2  --   --    Liver Function Tests: Recent Labs  Lab 04/09/21 2021 04/10/21 0426 04/11/21 0420  AST 922* 841* 615*  ALT 307* 266* 225*  ALKPHOS 180* 175* 150*  BILITOT 14.3* 12.8* 15.0*  PROT 7.2 6.2* 6.0*  ALBUMIN 3.2* 2.8* 2.7*   Recent Labs  Lab 04/09/21 2021  LIPASE 50   Recent Labs  Lab 04/09/21 2021 04/10/21 0819 04/11/21 0420  AMMONIA 57* 75* 56*   Coagulation Profile: Recent Labs  Lab 04/09/21 2021 04/10/21 0426 04/11/21 0420  INR 1.4* 1.4* 1.5*   CBC: Recent Labs  Lab 04/09/21 2021 04/10/21 0426 04/11/21 0420  WBC 3.9* 4.7 3.9*  NEUTROABS 2.4  --   --   HGB 12.6* 11.4* 10.4*  HCT 36.8* 33.0* 31.0*  MCV 103.7* 104.1* 104.4*  PLT 106* 95* 75*   Cardiac Enzymes: No results for input(s): CKTOTAL, CKMB, CKMBINDEX, TROPONINI in the last 168 hours. BNP: Invalid input(s): POCBNP CBG: No results for input(s): GLUCAP in the last 168 hours. HbA1C: Recent Labs    04/10/21 0819  HGBA1C 4.7*   Urine analysis:    Component Value Date/Time   COLORURINE AMBER (A) 04/10/2021 0003   APPEARANCEUR HAZY (A) 04/10/2021 0003   LABSPEC 1.017 04/10/2021 0003   PHURINE 6.0 04/10/2021 0003   GLUCOSEU NEGATIVE 04/10/2021 0003   HGBUR SMALL (A) 04/10/2021 0003   BILIRUBINUR MODERATE (A) 04/10/2021 0003   KETONESUR NEGATIVE 04/10/2021 0003   PROTEINUR 100 (A) 04/10/2021 0003   NITRITE NEGATIVE 04/10/2021 0003   LEUKOCYTESUR NEGATIVE 04/10/2021 0003   Sepsis Labs: @LABRCNTIP (procalcitonin:4,lacticidven:4) ) Recent Results (from  the past 240 hour(s))  Resp Panel by RT-PCR (Flu A&B, Covid) Nasopharyngeal Swab     Status: None   Collection Time: 04/09/21  8:29 PM   Specimen: Nasopharyngeal Swab; Nasopharyngeal(NP) swabs in vial transport medium  Result Value Ref Range Status   SARS Coronavirus 2 by RT PCR NEGATIVE NEGATIVE Final    Comment: (NOTE) SARS-CoV-2 target nucleic acids are NOT DETECTED.  The SARS-CoV-2 RNA is generally detectable in upper respiratory specimens during the acute phase of infection. The lowest concentration of SARS-CoV-2 viral copies this assay can detect is 138 copies/mL. A negative result does not preclude SARS-Cov-2 infection and should not be used as the sole basis for treatment or other patient management decisions. A negative result may occur with  improper specimen collection/handling, submission of specimen other than nasopharyngeal swab, presence of viral mutation(s) within the areas targeted by this assay, and inadequate number of viral copies(<138 copies/mL). A negative result must be combined with clinical observations, patient history, and  epidemiological information. The expected result is Negative.  Fact Sheet for Patients:  EntrepreneurPulse.com.au  Fact Sheet for Healthcare Providers:  IncredibleEmployment.be  This test is no t yet approved or cleared by the [place] FDA and  has been authorized for detection and/or diagnosis of SARS-CoV-2 by FDA under an Emergency Use Authorization (EUA). This EUA will remain  in effect (meaning this test can be used) for the duration of the COVID-19 declaration under Section 564(b)(1) of the Act, 21 U.S.C.section 360bbb-3(b)(1), unless the authorization is terminated  or revoked sooner.       Influenza A by PCR NEGATIVE NEGATIVE Final   Influenza B by PCR NEGATIVE NEGATIVE Final    Comment: (NOTE) The Xpert Xpress SARS-CoV-2/FLU/RSV plus assay is intended as an aid in the diagnosis of  influenza from Nasopharyngeal swab specimens and should not be used as a sole basis for treatment. Nasal washings and aspirates are unacceptable for Xpert Xpress SARS-CoV-2/FLU/RSV testing.  Fact Sheet for Patients: EntrepreneurPulse.com.au  Fact Sheet for Healthcare Providers: IncredibleEmployment.be  This test is not yet approved or cleared by the [place] FDA and has been authorized for detection and/or diagnosis of SARS-CoV-2 by FDA under an Emergency Use Authorization (EUA). This EUA will remain in effect (meaning this test can be used) for the duration of the COVID-19 declaration under Section 564(b)(1) of the Act, 21 U.S.C. section 360bbb-3(b)(1), unless the authorization is terminated or revoked.  Performed at [facility], [address] [place], [place] [address]   MRSA Next Gen by PCR, Nasal     Status: None   Collection Time: 04/10/21  4:21 PM   Specimen: Nasal Mucosa; Nasal Swab  Result Value Ref Range Status   MRSA by PCR Next Gen NOT DETECTED NOT DETECTED Final    Comment: (NOTE) The GeneXpert MRSA Assay (FDA approved for NASAL specimens only), is one component of a comprehensive MRSA colonization surveillance program. It is not intended to diagnose MRSA infection nor to guide or monitor treatment for MRSA infections. Test performance is not FDA approved in patients less than 52 years old. Performed at [facility], [address] [place], [place] [address]      Scheduled Meds:  chlordiazePOXIDE  25 mg Oral TID   Chlorhexidine Gluconate Cloth  6 each Topical Daily   digoxin  0.125 mg Oral Daily   folic acid  1 mg Oral Daily   lactulose  20 g Oral Daily   LORazepam  0-4 mg Intravenous Q6H   Followed by   Derrill Memo ON 04/12/2021] LORazepam  0-4 mg Intravenous Q12H   metoprolol succinate  50 mg Oral Daily   multivitamin with minerals  1 tablet Oral Daily   nicotine  21 mg Transdermal Daily   pantoprazole (PROTONIX) IV   40 mg Intravenous Q12H   prednisoLONE  40 mg Oral Q breakfast   thiamine  100 mg Oral Daily   Or   thiamine  100 mg Intravenous Daily   Continuous Infusions:  0.9 % NaCl with KCl 20 mEq / L 100 mL/hr at 04/10/21 1837    Procedures/Studies: DG Chest Portable 1 View  Result Date: 04/09/2021 CLINICAL DATA:  Shortness of breath, abdominal pain EXAM: PORTABLE CHEST 1 VIEW COMPARISON:  01/16/2020 FINDINGS: Heart and mediastinal contours are within normal limits. No focal opacities or effusions. No acute bony abnormality. IMPRESSION: No active disease. Electronically Signed   By: Rolm Baptise M.D.   On: 04/09/2021 20:36   ECHOCARDIOGRAM COMPLETE  Result Date: 04/10/2021  ECHOCARDIOGRAM REPORT   Patient Name:   RILEIGH DUDENHOEFFER Arauz Date of Exam: 04/10/2021 Medical Rec #:  [id number]        Height:       69.0 in Accession #:    [id number]       Weight:       218.0 lb Date of Birth:  1990-12-12        BSA:          2.143 m Patient Age:    30 years         BP:           140/84 mmHg Patient Gender: M                HR:           85 bpm. Exam Location:  [facility] Procedure: 2D Echo, Cardiac Doppler and Color Doppler Indications:    Cardiomyopathy  History:        Patient has prior history of Echocardiogram examinations, most                 recent 01/13/2020. CHF, Arrythmias:Atrial Fibrillation;                 Signs/Symptoms:Chest Pain. ETOH ABUSE.  Sonographer:    Wenda Low Referring Phys: [place]  1. Left ventricular ejection fraction, by estimation, is 70 to 75%. The left ventricle has hyperdynamic function. The left ventricle has no regional wall motion abnormalities. There is moderate asymmetric left ventricular hypertrophy of the basal septal  segment. Left ventricular diastolic parameters were normal. Elevated left atrial pressure. Mitral chordal SAM is noted with increased LVOT gradient of 60-100 mmHg. This is consistent with outflow obstruction and more of a  hypertrophic cardiomyopathy picture than the prior study.  2. Right ventricular systolic function is normal. The right ventricular size is normal. There is normal pulmonary artery systolic pressure. The estimated right ventricular systolic pressure is [id number] mmHg.  3. Left atrial size was moderately dilated.  4. The mitral valve is abnormal. Moderate mitral valve regurgitation.  5. The aortic valve is tricuspid. Aortic valve regurgitation is not visualized.  6. The inferior vena cava is normal in size with greater than 50% respiratory variability, suggesting right atrial pressure of 3 mmHg. Comparison(s): Prior images reviewed side by side. 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 FINDINGS  Left Ventricle: Left ventricular ejection fraction, by estimation, is 70 to 75%. The left ventricle has hyperdynamic function. The left ventricle has no regional wall motion abnormalities. The left ventricular internal cavity size was normal in size. There is moderate asymmetric left ventricular hypertrophy of the septal and basal segments. Left ventricular diastolic parameters were normal. Elevated left atrial pressure. Right Ventricle: The right ventricular size is normal. No increase in right ventricular wall thickness. Right ventricular systolic function is normal. There is normal pulmonary artery systolic pressure. The tricuspid regurgitant velocity is 2.56 m/s, and  with an assumed right atrial pressure of 3 mmHg, the estimated right ventricular systolic pressure is [id number] mmHg. Left Atrium: Left atrial size was moderately dilated. Right Atrium: Right atrial size was normal in size. Pericardium: There is no evidence of pericardial effusion. Mitral Valve: The mitral valve is abnormal. There is mild thickening of the mitral valve leaflet(s). Moderate mitral valve regurgitation, with eccentric posteriorly directed jet. MV peak gradient, 16.2 mmHg. The mean  mitral valve gradient is 6.0 mmHg. Tricuspid Valve:  The tricuspid valve is grossly normal. Tricuspid valve regurgitation is trivial. Aortic Valve: The aortic valve is tricuspid. There is mild aortic valve annular calcification. Aortic valve regurgitation is not visualized. Aortic valve mean gradient measures 12.5 mmHg. Aortic valve peak gradient measures 27.1 mmHg. Aortic valve area, by VTI measures 2.62 cm. Pulmonic Valve: The pulmonic valve was grossly normal. Pulmonic valve regurgitation is trivial. Aorta: The aortic root is normal in size and structure. Venous: The inferior vena cava is normal in size with greater than 50% respiratory variability, suggesting right atrial pressure of 3 mmHg. IAS/Shunts: No atrial level shunt detected by color flow Doppler.  LEFT VENTRICLE PLAX 2D LVIDd:         5.40 cm      Diastology LVIDs:         2.60 cm      LV e' medial:    8.81 cm/s LV PW:         1.15 cm      LV E/e' medial:  16.3 LV IVS:        1.20 cm      LV e' lateral:   11.30 cm/s LVOT diam:     2.10 cm      LV E/e' lateral: 12.7 LV SV:         128 LV SV Index:   60 LVOT Area:     3.46 cm  LV Volumes (MOD) LV vol d, MOD A2C: 92.2 ml LV vol d, MOD A4C: 106.0 ml LV vol s, MOD A2C: 33.3 ml LV vol s, MOD A4C: 47.9 ml LV SV MOD A2C:     58.9 ml LV SV MOD A4C:     106.0 ml LV SV MOD BP:      60.1 ml RIGHT VENTRICLE RV Basal diam:  3.10 cm RV Mid diam:    2.90 cm LEFT ATRIUM              Index        RIGHT ATRIUM           Index LA diam:        4.85 cm  2.26 cm/m   RA Area:     16.20 cm LA Vol (A2C):   110.0 ml 51.33 ml/m  RA Volume:   36.20 ml  16.89 ml/m LA Vol (A4C):   108.0 ml 50.39 ml/m LA Biplane Vol: 109.0 ml 50.86 ml/m  AORTIC VALVE                     PULMONIC VALVE AV Area (Vmax):    3.00 cm      PV Vmax:       1.12 m/s AV Area (Vmean):   3.30 cm      PV Peak grad:  5.0 mmHg AV Area (VTI):     2.62 cm AV Vmax:           260.50 cm/s AV Vmean:          163.500 cm/s AV VTI:            0.491 m AV Peak Grad:       27.1 mmHg AV Mean Grad:      12.5 mmHg LVOT Vmax:         225.50 cm/s LVOT Vmean:        156.000 cm/s LVOT VTI:          0.371 m LVOT/AV VTI ratio: 0.76 MITRAL VALVE  TRICUSPID VALVE MV Area (PHT): 4.54 cm     TR Peak grad:   26.2 mmHg MV Area VTI:   4.22 cm     TR Vmax:        256.00 cm/s MV Peak grad:  16.2 mmHg MV Mean grad:  6.0 mmHg     SHUNTS MV Vmax:       2.01 m/s     Systemic VTI:  0.37 m MV Vmean:      108.0 cm/s   Systemic Diam: 2.10 cm MV Decel Time: 167 msec MV E velocity: 144.00 cm/s MV A velocity: 99.50 cm/s MV E/A ratio:  1.45 Rozann Lesches MD Electronically signed by Rozann Lesches MD Signature Date/Time: 04/10/2021/2:35:18 PM    Final    US Abdomen Limited RUQ (LIVER/GB)  Result Date: 04/10/2021 CLINICAL DATA:  Increased bilirubin for 2 days EXAM: ULTRASOUND ABDOMEN LIMITED RIGHT UPPER QUADRANT COMPARISON:  Abdominal ultrasound 01/12/2020 FINDINGS: Gallbladder: There is layering sludge in the gallbladder. There is mild gallbladder wall thickening and small amount of pericholecystic fluid. No shadowing stones are seen. Sonographic Percell Miller sign was reported negative Common bile duct: Diameter: 4 mm.  There is no intrahepatic biliary ductal dilatation. Liver: Parenchymal echogenicity is diffusely increased throughout the liver. No focal lesions are seen. Portal vein is patent on color Doppler imaging with normal direction of blood flow towards the liver. Other: None. IMPRESSION: 1. Layering sludge in the gallbladder with mild gallbladder wall thickening and small amount of pericholecystic fluid. Sonographic Percell Miller sign was reported negative. If there is clinical concern for acute cholecystitis, HIDA scan may be obtained. 2. No intra or extrahepatic biliary ductal dilatation. 3. Severe fatty infiltration of the liver. Electronically Signed   By: Valetta Mole M.D.   On: 04/10/2021 09:07    Orson Eva, DO  [facility]  If 7PM-7AM, please contact  night-coverage [email or web address] Password [email or web address] 04/11/2021, 3:50 PM   LOS: 2 days

## 2021-04-11 NOTE — Progress Notes (Signed)
Subjective: Patient reports he is feeling some better today. He denies nausea, vomiting, abdominal pain. Has not had a BM today,thinks he had a small one yesterday. Reports epigastric pain has improved since PPI was started. He reports mild headache once since admission, he denies any sweating. Mild tremors in hands.   Objective: Vital signs in last 24 hours: Temp:  [98.8 F (37.1 C)-99.2 F (37.3 C)] 99.1 F (37.3 C) (11/16 0326) Pulse Rate:  [85-108] 88 (11/16 0400) Resp:  [12-29] 13 (11/16 0400) BP: (131-161)/(65-93) 161/88 (11/16 0400) SpO2:  [98 %-100 %] 99 % (11/15 2100) Weight:  [98.9 kg-99.8 kg] 99.8 kg (11/15 1633) Last BM Date: 04/11/21 General:   Alert and oriented, pleasant Head:  Normocephalic and atraumatic. Eyes:  sclera icteric Mouth:  Without lesions, mucosa pink and moist.   Heart:  S1, S2 present, no murmurs noted.  Lungs: Clear to auscultation bilaterally, without wheezing, rales, or rhonchi.  Abdomen:  Bowel sounds present, soft, non-tender, non-distended. No HSM or hernias noted. No rebound or guarding. No masses appreciated  Msk:  Symmetrical without gross deformities. Normal posture. Pulses:  Normal pulses noted. Extremities:  Without clubbing or edema. Neurologic:  Alert and  oriented x4;  grossly normal neurologically. No asterixis, mild tremor with hands outstretched. Speech mildly slurred  Skin:  Warm and dry, jaundiced  Psych:  Alert and cooperative. Normal mood and affect.  Intake/Output from previous day: 11/15 0701 - 11/16 0700 In: 3012.2 [P.O.:500; I.V.:2012.2; IV Piggyback:500] Out: 750 [Urine:750] Intake/Output this shift: No intake/output data recorded.  Lab Results: Recent Labs    04/09/21 2021 04/10/21 0426 04/11/21 0420  WBC 3.9* 4.7 3.9*  HGB 12.6* 11.4* 10.4*  HCT 36.8* 33.0* 31.0*  PLT 106* 95* 75*   BMET Recent Labs    04/09/21 2021 04/10/21 0426 04/11/21 0420  NA 137 137 133*  K 3.7 3.5 3.8  CL 98 100 102  CO2 23 25  22   GLUCOSE 120* 120* 85  BUN 7 7 6   CREATININE 0.32* 0.44* <0.30*  CALCIUM 8.2* 7.9* 8.0*   LFT Recent Labs    04/09/21 2021 04/10/21 0426 04/11/21 0420  PROT 7.2 6.2* 6.0*  ALBUMIN 3.2* 2.8* 2.7*  AST 922* 841* 615*  ALT 307* 266* 225*  ALKPHOS 180* 175* 150*  BILITOT 14.3* 12.8* 15.0*   PT/INR Recent Labs    04/10/21 0426 04/11/21 0420  LABPROT 16.9* 17.6*  INR 1.4* 1.5*   Hepatitis Panel Recent Labs    04/09/21 2021  HEPBSAG NON REACTIVE  HCVAB NON REACTIVE  HEPAIGM NON REACTIVE  HEPBIGM NON REACTIVE   Studies/Results: DG Chest Portable 1 View  Result Date: 04/09/2021 CLINICAL DATA:  Shortness of breath, abdominal pain EXAM: PORTABLE CHEST 1 VIEW COMPARISON:  01/16/2020 FINDINGS: Heart and mediastinal contours are within normal limits. No focal opacities or effusions. No acute bony abnormality. IMPRESSION: No active disease. Electronically Signed   By: Rolm Baptise M.D.   On: 04/09/2021 20:36   ECHOCARDIOGRAM COMPLETE  Result Date: 04/10/2021    ECHOCARDIOGRAM REPORT   Patient Name:   Omar Mccarthy Date of Exam: 04/10/2021 Medical Rec #:  [id number]        Height:       69.0 in Accession #:    [id number]       Weight:       218.0 lb Date of Birth:  [birth year]        BSA:          2.143  m Patient Age:    30 years         BP:           140/84 mmHg Patient Gender: M                HR:           85 bpm. Exam Location:  [facility] Procedure: 2D Echo, Cardiac Doppler and Color Doppler Indications:    Cardiomyopathy  History:        Patient has prior history of Echocardiogram examinations, most                 recent 01/13/2020. CHF, Arrythmias:Atrial Fibrillation;                 Signs/Symptoms:Chest Pain. ETOH ABUSE.  Sonographer:    Wenda Low Referring Phys: [place]  1. Left ventricular ejection fraction, by estimation, is 70 to 75%. The left ventricle has hyperdynamic function. The left ventricle has no regional wall motion  abnormalities. There is moderate asymmetric left ventricular hypertrophy of the basal septal  segment. Left ventricular diastolic parameters were normal. Elevated left atrial pressure. Mitral chordal SAM is noted with increased LVOT gradient of 60-100 mmHg. This is consistent with outflow obstruction and more of a hypertrophic cardiomyopathy picture than the prior study.  2. Right ventricular systolic function is normal. The right ventricular size is normal. There is normal pulmonary artery systolic pressure. The estimated right ventricular systolic pressure is [id number] mmHg.  3. Left atrial size was moderately dilated.  4. The mitral valve is abnormal. Moderate mitral valve regurgitation.  5. The aortic valve is tricuspid. Aortic valve regurgitation is not visualized.  6. The inferior vena cava is normal in size with greater than 50% respiratory variability, suggesting right atrial pressure of 3 mmHg. Comparison(s): Prior images reviewed side by side. 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 FINDINGS  Left Ventricle: Left ventricular ejection fraction, by estimation, is 70 to 75%. The left ventricle has hyperdynamic function. The left ventricle has no regional wall motion abnormalities. The left ventricular internal cavity size was normal in size. There is moderate asymmetric left ventricular hypertrophy of the septal and basal segments. Left ventricular diastolic parameters were normal. Elevated left atrial pressure. Right Ventricle: The right ventricular size is normal. No increase in right ventricular wall thickness. Right ventricular systolic function is normal. There is normal pulmonary artery systolic pressure. The tricuspid regurgitant velocity is 2.56 m/s, and  with an assumed right atrial pressure of 3 mmHg, the estimated right ventricular systolic pressure is [id number] mmHg. Left Atrium: Left atrial size was moderately dilated. Right Atrium:  Right atrial size was normal in size. Pericardium: There is no evidence of pericardial effusion. Mitral Valve: The mitral valve is abnormal. There is mild thickening of the mitral valve leaflet(s). Moderate mitral valve regurgitation, with eccentric posteriorly directed jet. MV peak gradient, 16.2 mmHg. The mean mitral valve gradient is 6.0 mmHg. Tricuspid Valve: The tricuspid valve is grossly normal. Tricuspid valve regurgitation is trivial. Aortic Valve: The aortic valve is tricuspid. There is mild aortic valve annular calcification. Aortic valve regurgitation is not visualized. Aortic valve mean gradient measures 12.5 mmHg. Aortic valve peak gradient measures 27.1 mmHg. Aortic valve area, by VTI measures 2.62 cm. Pulmonic Valve: The pulmonic valve was grossly normal. Pulmonic valve regurgitation is trivial. Aorta: The aortic root is normal in size and structure. Venous: The inferior vena  cava is normal in size with greater than 50% respiratory variability, suggesting right atrial pressure of 3 mmHg. IAS/Shunts: No atrial level shunt detected by color flow Doppler.  LEFT VENTRICLE PLAX 2D LVIDd:         5.40 cm      Diastology LVIDs:         2.60 cm      LV e' medial:    8.81 cm/s LV PW:         1.15 cm      LV E/e' medial:  16.3 LV IVS:        1.20 cm      LV e' lateral:   11.30 cm/s LVOT diam:     2.10 cm      LV E/e' lateral: 12.7 LV SV:         128 LV SV Index:   60 LVOT Area:     3.46 cm  LV Volumes (MOD) LV vol d, MOD A2C: 92.2 ml LV vol d, MOD A4C: 106.0 ml LV vol s, MOD A2C: 33.3 ml LV vol s, MOD A4C: 47.9 ml LV SV MOD A2C:     58.9 ml LV SV MOD A4C:     106.0 ml LV SV MOD BP:      60.1 ml RIGHT VENTRICLE RV Basal diam:  3.10 cm RV Mid diam:    2.90 cm LEFT ATRIUM              Index        RIGHT ATRIUM           Index LA diam:        4.85 cm  2.26 cm/m   RA Area:     16.20 cm LA Vol (A2C):   110.0 ml 51.33 ml/m  RA Volume:   36.20 ml  16.89 ml/m LA Vol (A4C):   108.0 ml 50.39 ml/m LA Biplane Vol:  109.0 ml 50.86 ml/m  AORTIC VALVE                     PULMONIC VALVE AV Area (Vmax):    3.00 cm      PV Vmax:       1.12 m/s AV Area (Vmean):   3.30 cm      PV Peak grad:  5.0 mmHg AV Area (VTI):     2.62 cm AV Vmax:           260.50 cm/s AV Vmean:          163.500 cm/s AV VTI:            0.491 m AV Peak Grad:      27.1 mmHg AV Mean Grad:      12.5 mmHg LVOT Vmax:         225.50 cm/s LVOT Vmean:        156.000 cm/s LVOT VTI:          0.371 m LVOT/AV VTI ratio: 0.76 MITRAL VALVE                TRICUSPID VALVE MV Area (PHT): 4.54 cm     TR Peak grad:   26.2 mmHg MV Area VTI:   4.22 cm     TR Vmax:        256.00 cm/s MV Peak grad:  16.2 mmHg MV Mean grad:  6.0 mmHg     SHUNTS MV Vmax:       2.01 m/s  Systemic VTI:  0.37 m MV Vmean:      108.0 cm/s   Systemic Diam: 2.10 cm MV Decel Time: 167 msec MV E velocity: 144.00 cm/s MV A velocity: 99.50 cm/s MV E/A ratio:  1.45 Nona Dell MD Electronically signed by Nona Dell MD Signature Date/Time: 04/10/2021/2:35:18 PM    Final    US Abdomen Limited RUQ (LIVER/GB)  Result Date: 04/10/2021 CLINICAL DATA:  Increased bilirubin for 2 days EXAM: ULTRASOUND ABDOMEN LIMITED RIGHT UPPER QUADRANT COMPARISON:  Abdominal ultrasound 01/12/2020 FINDINGS: Gallbladder: There is layering sludge in the gallbladder. There is mild gallbladder wall thickening and small amount of pericholecystic fluid. No shadowing stones are seen. Sonographic Eulah Pont sign was reported negative Common bile duct: Diameter: 4 mm.  There is no intrahepatic biliary ductal dilatation. Liver: Parenchymal echogenicity is diffusely increased throughout the liver. No focal lesions are seen. Portal vein is patent on color Doppler imaging with normal direction of blood flow towards the liver. Other: None. IMPRESSION: 1. Layering sludge in the gallbladder with mild gallbladder wall thickening and small amount of pericholecystic fluid. Sonographic Eulah Pont sign was reported negative. If there is clinical  concern for acute cholecystitis, HIDA scan may be obtained. 2. No intra or extrahepatic biliary ductal dilatation. 3. Severe fatty infiltration of the liver. Electronically Signed   By: Lesia Hausen M.D.   On: 04/10/2021 09:07    Assessment: 30 year old male with hx of significant alcohol abuse, elevated LFTs, depression/anxiety, A fib on Eliquis and digoxin, HTN, who presented to the ED yesterday for further evaluation of jaundice, with markedly elevated LFTs, Hyperbilirubinemia, GI consulted for further eval.  Acute on chronic elevated LFTs/hyperbilirubinemia: Suspect secondary to alcoholic hepatitis in setting of chronic alcohol abuse with admitted intake of 1/4 gallon of vodka per day. Acute Hep panel negative. LFTs continue to trend down today, however T bili has risen from 14.3 to 15, AST 615(922), ALT 225(307), ALP 150(180). INR relatively stable at 1.5 (1.4) today. Thrombocytopenia 106k on admission, down to 75k today, likely r/t acute hepatitis.[place] yesterday with severe fatty infiltration of liver, w/o evidence of cirrhosis, sludge in gallbladder with mild wall thickening and small amount of pericholecystic fluid, likely reactive secondary to acute hepatitis rather than cholecystitis as he is without significant abdominal pain. Ammonia mildly elevated to 75 yesterday without signs of HE, lactulose was initiated, ammonia down to 56 today,  No ascites or peripheral edema. DF 34.8 today, 30.9 on admission (PT control of 13.3). MELD today is 24. Prednisolone has been held thus far, but will need to be initiated today given his increase in DF to >32.   I thoroughly discussed the importance of alcohol cessation/medical management of his current liver damage as well as appropriate mental health counseling/therapy for alcoholism as patient is only 30 with significant hx of ongoing alcohol abuse and related health disparities. He reports support system of his parents and fiancee. We discussed implications of  his continued alcohol use that could ultimately result in untimely death, if continued. He denies any previous mental health based therapy for alcohol cessation and has only had medical detox in the past. He would benefit from mental health support, otherwise it is unlikely that simple medical detox will induce continued sobriety, he should have resources provided to him upon discharge for this.   Patient also inquired about leaving the hospital and being treated as outpatient for his alcoholic hepatitis, as he does not want to be here any longer, I discussed with him the  importance of continued inpatient monitoring and treatment. Patient verbalized understanding.  GERD/Nausea/ epigastric pain/dark stools: GERD is uncontrolled on outpatient basis, associated esophageal burning postprandially, Reported nausea and vomiting ongoing since age 78 with intermittent epigastric pain and dark green/black stools a few days out of the week. Denies NSAID use. Has not taken Eliquis for his A fib in approx 2 months, no Iron or pepto bismol,. PPI BID initiated yesterday with improvement in epigastric pain, he denies any black stools or rectal bleeding today.  Hgb 12.6 on admission, down to 10.4 today, likely in setting of IV rehydration. B12 elevated, Folate low at 4.3. Hemoccult ordered yesterday(not collected), patient previously declined rectal exam and was not interested in pursing EGD for further evaluation, however, should consider alcohol induced esophagitis, gastritis, duodenitis, PUD as differentials that may require further evaluation in the future if black stools and anemia persists.   Plan: Initiate prednisolone 40mg  daily, r/t DF >32 (taper down by 5mg  weekly, will continue total course x28 days) Continue lactulose, titrate to 2-3 soft stools per day Continue to monitor for signs of HE Continue IV PPI BID Hemoccult needs to be collected Monitor H&H, overt GI bleeding CBC, CMP and INR daily Alcohol  cessation of utmost importance, patient needs mental health/counseling resources for alcohol cessation provided to him at discharge   LOS: 2 days    04/11/2021, 9:25 AM   Omar Mccarthy L. Alver Sorrow, MSN, APRN, AGNP-C Adult-Gerontology Nurse Practitioner [facility]

## 2021-04-12 ENCOUNTER — Telehealth: Payer: Self-pay | Admitting: Gastroenterology

## 2021-04-12 ENCOUNTER — Encounter (HOSPITAL_COMMUNITY): Payer: Self-pay | Admitting: Family Medicine

## 2021-04-12 DIAGNOSIS — F10931 Alcohol use, unspecified with withdrawal delirium: Secondary | ICD-10-CM

## 2021-04-12 DIAGNOSIS — R748 Abnormal levels of other serum enzymes: Secondary | ICD-10-CM

## 2021-04-12 LAB — CBC
HCT: 31.2 % — ABNORMAL LOW (ref 39.0–52.0)
Hemoglobin: 10.5 g/dL — ABNORMAL LOW (ref 13.0–17.0)
MCH: 36.2 pg — ABNORMAL HIGH (ref 26.0–34.0)
MCHC: 33.7 g/dL (ref 30.0–36.0)
MCV: 107.6 fL — ABNORMAL HIGH (ref 80.0–100.0)
Platelets: 81 10*3/uL — ABNORMAL LOW (ref 150–400)
RBC: 2.9 MIL/uL — ABNORMAL LOW (ref 4.22–5.81)
RDW: 12.3 % (ref 11.5–15.5)
WBC: 3.6 10*3/uL — ABNORMAL LOW (ref 4.0–10.5)
nRBC: 0 % (ref 0.0–0.2)

## 2021-04-12 LAB — MAGNESIUM: Magnesium: 1.6 mg/dL — ABNORMAL LOW (ref 1.7–2.4)

## 2021-04-12 LAB — PROTIME-INR
INR: 1.5 — ABNORMAL HIGH (ref 0.8–1.2)
Prothrombin Time: 17.8 seconds — ABNORMAL HIGH (ref 11.4–15.2)

## 2021-04-12 LAB — COMPREHENSIVE METABOLIC PANEL
ALT: 201 U/L — ABNORMAL HIGH (ref 0–44)
AST: 431 U/L — ABNORMAL HIGH (ref 15–41)
Albumin: 2.7 g/dL — ABNORMAL LOW (ref 3.5–5.0)
Alkaline Phosphatase: 130 U/L — ABNORMAL HIGH (ref 38–126)
Anion gap: 10 (ref 5–15)
BUN: 8 mg/dL (ref 6–20)
CO2: 17 mmol/L — ABNORMAL LOW (ref 22–32)
Calcium: 8.2 mg/dL — ABNORMAL LOW (ref 8.9–10.3)
Chloride: 103 mmol/L (ref 98–111)
Creatinine, Ser: 0.32 mg/dL — ABNORMAL LOW (ref 0.61–1.24)
GFR, Estimated: >60 mL/min (ref >60)
Glucose, Bld: 185 mg/dL — ABNORMAL HIGH (ref 70–99)
Potassium: 4.2 mmol/L (ref 3.5–5.1)
Sodium: 130 mmol/L — ABNORMAL LOW (ref 135–145)
Total Bilirubin: 15.6 mg/dL — ABNORMAL HIGH (ref 0.3–1.2)
Total Protein: 6.1 g/dL — ABNORMAL LOW (ref 6.5–8.1)

## 2021-04-12 LAB — AMMONIA: Ammonia: 73 umol/L — ABNORMAL HIGH (ref 9–35)

## 2021-04-12 MED ORDER — MAGNESIUM SULFATE 2 GM/50ML IV SOLN
2.0000 g | Freq: Once | INTRAVENOUS | Status: AC
  Administered 2021-04-12: 19:00:00 2 g via INTRAVENOUS
  Filled 2021-04-12: qty 50

## 2021-04-12 MED ORDER — DEXMEDETOMIDINE HCL IN NACL 400 MCG/100ML IV SOLN
0.4000 ug/kg/h | INTRAVENOUS | Status: DC
  Administered 2021-04-12: 12:00:00 0.4 ug/kg/h via INTRAVENOUS
  Administered 2021-04-12: 18:00:00 1 ug/kg/h via INTRAVENOUS
  Administered 2021-04-12: 23:00:00 0.6 ug/kg/h via INTRAVENOUS
  Administered 2021-04-12: 14:00:00 1.2 ug/kg/h via INTRAVENOUS
  Filled 2021-04-12 (×4): qty 100

## 2021-04-12 MED ORDER — OXYCODONE HCL 5 MG PO TABS
5.0000 mg | ORAL_TABLET | Freq: Once | ORAL | Status: AC
  Administered 2021-04-12: 5 mg via ORAL
  Filled 2021-04-12: qty 1

## 2021-04-12 NOTE — Progress Notes (Signed)
Patient having hypertension and bradycardia from precedex. Drip titrated down. MD notified. RN for night shift notified. No new orders at this time

## 2021-04-12 NOTE — TOC Initial Note (Signed)
Transition of Care [facility] - Initial/Assessment Note    Patient Details  Name: Omar Mccarthy MRN: [id number] Date of Birth: 22-Nov-1990  [facility] of [facility]) CM/SW Contact:    Villa Herb, LCSWA Phone Number: 04/12/2021, 11:10 AM  Clinical Narrative:                 [facility] consulted for substance use resources. CSW visited pt in room to inquire about pts interest in substance use resources. Pt is agreeable to resources. CSW explained to pt and his mother that it may be best to call around and see what program would be best for pt if he is interested. Pt and mother thankful for resources.   Pt also inquiring about needing assistance if MD prescribes new medications. CSW explained MATCH program to pt and his mother. TOC to follow for possible need for match voucher.   Expected Discharge Plan: Home/Self Care Barriers to Discharge: Continued Medical Work up   Patient Goals and CMS Choice Patient states their goals for this hospitalization and ongoing recovery are:: Return home CMS Medicare.gov Compare Post Acute Care list provided to:: Patient Choice offered to / list presented to : Patient  Expected Discharge Plan and Services Expected Discharge Plan: Home/Self Care In-house Referral: Clinical Social Work Discharge Planning Services: CM Consult   Living arrangements for the past 2 months: Single Family Home                                      Prior Living Arrangements/Services Living arrangements for the past 2 months: Single Family Home Lives with:: Parents Patient language and need for interpreter reviewed:: Yes Do you feel safe going back to the place where you live?: Yes      Need for Family Participation in Patient Care: Yes (Comment) Care giver support system in place?: Yes (comment)   Criminal Activity/Legal Involvement Pertinent to Current Situation/Hospitalization: No - Comment as needed  Activities of Daily Living Home Assistive Devices/Equipment:  None ADL Screening (condition at time of admission) Patient's cognitive ability adequate to safely complete daily activities?: Yes Is the patient deaf or have difficulty hearing?: No Does the patient have difficulty seeing, even when wearing glasses/contacts?: No Does the patient have difficulty concentrating, remembering, or making decisions?: No Patient able to express need for assistance with ADLs?: Yes Does the patient have difficulty dressing or bathing?: No Independently performs ADLs?: Yes (appropriate for developmental age) Does the patient have difficulty walking or climbing stairs?: No Weakness of Legs: None Weakness of Arms/Hands: None  Permission Sought/Granted                  Emotional Assessment Appearance:: Appears stated age Attitude/Demeanor/Rapport: Engaged Affect (typically observed): Accepting Orientation: : Oriented to Self, Oriented to Place, Oriented to  Time, Oriented to Situation Alcohol / Substance Use: Not Applicable Psych Involvement: No (comment)  Admission diagnosis:  Acute alcoholic hepatitis [K70.10] Hyperbilirubinemia [E80.6] Epigastric pain [R10.13] Elevated liver enzymes [R74.8] Acute liver failure [K72.00] Patient Active Problem List   Diagnosis Date Noted   HFrEF (heart failure with reduced ejection fraction) (HCC) 04/10/2021   Polysubstance abuse (HCC) 04/10/2021   Adderall use disorder, mild (HCC) 04/10/2021   Noncompliance with medication regimen 04/10/2021   Hyperbilirubinemia 04/10/2021   Elevated transaminase level 04/10/2021   Paroxysmal atrial fibrillation (HCC) 04/10/2021   Ethanol causing toxic effect (HCC) 04/10/2021   Hyperglycemia 04/10/2021  Hypoalbuminemia 04/10/2021   Anemia, chronic disease 04/10/2021   Hypomagnesemia 04/10/2021   Thrombocytopenia ([place]) 04/10/2021   Sinus tachycardia 04/10/2021   Severe protein-calorie malnutrition ([place]) 04/10/2021   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  04/10/2021    Elevated INR 04/10/2021   Hyperammonemia (HCC) 04/10/2021   Elevated alkaline phosphatase level 04/10/2021   Acute liver failure 04/10/2021   Chronic pain 04/10/2021   Anxiety and depression 04/10/2021   SEVERE Hepatic steatosis 04/10/2021   Gastroesophageal reflux disease    Black stools    Nausea and vomiting    Epigastric pain    Acute alcoholic hepatitis [id number]   Chest pain of uncertain etiology [id number]   Anticoagulated 04/04/2020   Persistent atrial fibrillation ([place]) 01/19/2020   Respiratory failure requiring intubation ([place])    Lack of intravenous access    Fluid overload 01/11/2020   Alcohol abuse with intoxication ([place]) 01/11/2020   Leg swelling 01/11/2020   PCP:  Health, [place]:   [facility] [id number] - [place], [place] - [address] [place] [address] Phone: [phone] Fax: [phone]     Social Determinants of Health (SDOH) Interventions    Readmission Risk Interventions Readmission Risk Prevention Plan 04/12/2021  Transportation Screening Complete  Home Care Screening Complete  Medication Review (RN CM) Complete  Some recent data might be hidden

## 2021-04-12 NOTE — Progress Notes (Addendum)
Subjective:  Patient wanting to go home. States his abdominal pain is better. He states he ate better this morning. He does not want to consider inpatient rehab due to expense and because he wants to be home for the [facility]. He says he knows he can't afford to drink anymore.   Objective: Vital signs in last 24 hours: Temp:  [97.8 F (36.6 C)-98.8 F (37.1 C)] 97.9 F (36.6 C) (11/17 0809) Pulse Rate:  [77-86] 77 (11/17 0809) Resp:  [8-26] 25 (11/17 0809) BP: (122-161)/(76-135) 122/83 (11/17 0809) SpO2:  [99 %] 99 % (11/17 0809) Last BM Date: 04/11/21 General:   Alert,  anxious appearing male in NAD.   Head:  Normocephalic and atraumatic. Eyes:  Sclera clear, + icterus.  Abdomen:  Soft, nontender and nondistended. Normal bowel sounds, without guarding, and without rebound.  Liver edge easily palpated. Extremities:  Without clubbing, deformity or edema. Neurologic:  Alert and  oriented x4;  grossly normal neurologically. +asterixis. Skin:  Jaundice Psych:  Alert and cooperative. anxious.  Intake/Output from previous day: 11/16 0701 - 11/17 0700 In: 2750.2 [P.O.:640; I.V.:2110.2] Out: 1850 [Urine:1850] Intake/Output this shift: Total I/O In: 360 [P.O.:360] Out: -   Lab Results: CBC Recent Labs    04/10/21 0426 04/11/21 0420 04/12/21 0427  WBC 4.7 3.9* 3.6*  HGB 11.4* 10.4* 10.5*  HCT 33.0* 31.0* 31.2*  MCV 104.1* 104.4* 107.6*  PLT 95* 75* 81*   BMET Recent Labs    04/10/21 0426 04/11/21 0420 04/12/21 0427  NA 137 133* 130*  K 3.5 3.8 4.2  CL 100 102 103  CO2 25 22 17*  GLUCOSE 120* 85 185*  BUN 7 6 8   CREATININE 0.44* <0.30* 0.32*  CALCIUM 7.9* 8.0* 8.2*   LFTs Recent Labs    04/10/21 0426 04/11/21 0420 04/12/21 0427  BILITOT 12.8* 15.0* 15.6*  ALKPHOS 175* 150* 130*  AST 841* 615* 431*  ALT 266* 225* 201*  PROT 6.2* 6.0* 6.1*  ALBUMIN 2.8* 2.7* 2.7*   Recent Labs    04/09/21 2021  LIPASE 50   PT/INR Recent Labs    04/10/21 0426  04/11/21 0420 04/12/21 0427  LABPROT 16.9* 17.6* 17.8*  INR 1.4* 1.5* 1.5*      Imaging Studies: DG Chest Portable 1 View  Result Date: 04/09/2021 CLINICAL DATA:  Shortness of breath, abdominal pain EXAM: PORTABLE CHEST 1 VIEW COMPARISON:  01/16/2020 FINDINGS: Heart and mediastinal contours are within normal limits. No focal opacities or effusions. No acute bony abnormality. IMPRESSION: No active disease. Electronically Signed   By: 01/18/2020 M.D.   On: 04/09/2021 20:36   ECHOCARDIOGRAM COMPLETE  Result Date: 04/10/2021    ECHOCARDIOGRAM REPORT   Patient Name:   Omar Mccarthy Date of Exam: 04/10/2021 Medical Rec #:  04/12/2021        Height:       69.0 in Accession #:    [id number]       Weight:       218.0 lb Date of Birth:  Nov 25, 1990        BSA:          2.143 m Patient Age:    30 years         BP:           140/84 mmHg Patient Gender: M                HR:           85 bpm. [facility] Procedure: 2D Echo, Cardiac Doppler and Color Doppler Indications:    Cardiomyopathy  History:        Patient has prior history of Echocardiogram examinations, most                 recent 01/13/2020. CHF, Arrythmias:Atrial Fibrillation;                 Signs/Symptoms:Chest Pain. ETOH ABUSE.  Sonographer:    Mikki Harbor Referring Phys: [phone] CLANFORD L JOHNSON IMPRESSIONS  1. Left ventricular ejection fraction, by estimation, is 70 to 75%. The left ventricle has hyperdynamic function. The left ventricle has no regional wall motion abnormalities. There is moderate asymmetric left ventricular hypertrophy of the basal septal  segment. Left ventricular diastolic parameters were normal. Elevated left atrial pressure. Mitral chordal SAM is noted with increased LVOT gradient of 60-100 mmHg. This is consistent with outflow obstruction and more of a hypertrophic cardiomyopathy picture than the prior study.  2. Right ventricular systolic function is normal. The right ventricular size is normal. There is  normal pulmonary artery systolic pressure. The estimated right ventricular systolic pressure is 29.2 mmHg.  3. Left atrial size was moderately dilated.  4. The mitral valve is abnormal. Moderate mitral valve regurgitation.  5. The aortic valve is tricuspid. Aortic valve regurgitation is not visualized.  6. The inferior vena cava is normal in size with greater than 50% respiratory variability, suggesting right atrial pressure of 3 mmHg. Comparison(s): Prior images reviewed side by side. 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 FINDINGS  Left Ventricle: Left ventricular ejection fraction, by estimation, is 70 to 75%. The left ventricle has hyperdynamic function. The left ventricle has no regional wall motion abnormalities. The left ventricular internal cavity size was normal in size. There is moderate asymmetric left ventricular hypertrophy of the septal and basal segments. Left ventricular diastolic parameters were normal. Elevated left atrial pressure. Right Ventricle: The right ventricular size is normal. No increase in right ventricular wall thickness. Right ventricular systolic function is normal. There is normal pulmonary artery systolic pressure. The tricuspid regurgitant velocity is 2.56 m/s, and  with an assumed right atrial pressure of 3 mmHg, the estimated right ventricular systolic pressure is 29.2 mmHg. Left Atrium: Left atrial size was moderately dilated. Right Atrium: Right atrial size was normal in size. Pericardium: There is no evidence of pericardial effusion. Mitral Valve: The mitral valve is abnormal. There is mild thickening of the mitral valve leaflet(s). Moderate mitral valve regurgitation, with eccentric posteriorly directed jet. MV peak gradient, 16.2 mmHg. The mean mitral valve gradient is 6.0 mmHg. Tricuspid Valve: The tricuspid valve is grossly normal. Tricuspid valve regurgitation is trivial. Aortic Valve: The aortic  valve is tricuspid. There is mild aortic valve annular calcification. Aortic valve regurgitation is not visualized. Aortic valve mean gradient measures 12.5 mmHg. Aortic valve peak gradient measures 27.1 mmHg. Aortic valve area, by VTI measures 2.62 cm. Pulmonic Valve: The pulmonic valve was grossly normal. Pulmonic valve regurgitation is trivial. Aorta: The aortic root is normal in size and structure. Venous: The inferior vena cava is normal in size with greater than 50% respiratory variability, suggesting right atrial pressure of 3 mmHg. IAS/Shunts: No atrial level shunt detected by color flow Doppler.  LEFT VENTRICLE PLAX 2D LVIDd:         5.40 cm      Diastology LVIDs:         2.60 cm  LV e' medial:    8.81 cm/s LV PW:         1.15 cm      LV E/e' medial:  16.3 LV IVS:        1.20 cm      LV e' lateral:   11.30 cm/s LVOT diam:     2.10 cm      LV E/e' lateral: 12.7 LV SV:         128 LV SV Index:   60 LVOT Area:     3.46 cm  LV Volumes (MOD) LV vol d, MOD A2C: 92.2 ml LV vol d, MOD A4C: 106.0 ml LV vol s, MOD A2C: 33.3 ml LV vol s, MOD A4C: 47.9 ml LV SV MOD A2C:     58.9 ml LV SV MOD A4C:     106.0 ml LV SV MOD BP:      60.1 ml RIGHT VENTRICLE RV Basal diam:  3.10 cm RV Mid diam:    2.90 cm LEFT ATRIUM              Index        RIGHT ATRIUM           Index LA diam:        4.85 cm  2.26 cm/m   RA Area:     16.20 cm LA Vol (A2C):   110.0 ml 51.33 ml/m  RA Volume:   36.20 ml  16.89 ml/m LA Vol (A4C):   108.0 ml 50.39 ml/m LA Biplane Vol: 109.0 ml 50.86 ml/m  AORTIC VALVE                     PULMONIC VALVE AV Area (Vmax):    3.00 cm      PV Vmax:       1.12 m/s AV Area (Vmean):   3.30 cm      PV Peak grad:  5.0 mmHg AV Area (VTI):     2.62 cm AV Vmax:           260.50 cm/s AV Vmean:          163.500 cm/s AV VTI:            0.491 m AV Peak Grad:      27.1 mmHg AV Mean Grad:      12.5 mmHg LVOT Vmax:         225.50 cm/s LVOT Vmean:        156.000 cm/s LVOT VTI:          0.371 m LVOT/AV VTI ratio: 0.76  MITRAL VALVE                TRICUSPID VALVE MV Area (PHT): 4.54 cm     TR Peak grad:   26.2 mmHg MV Area VTI:   4.22 cm     TR Vmax:        256.00 cm/s MV Peak grad:  16.2 mmHg MV Mean grad:  6.0 mmHg     SHUNTS MV Vmax:       2.01 m/s     Systemic VTI:  0.37 m MV Vmean:      108.0 cm/s   Systemic Diam: 2.10 cm MV Decel Time: 167 msec MV E velocity: 144.00 cm/s MV A velocity: 99.50 cm/s MV E/A ratio:  1.45 Nona Dell MD Electronically signed by Nona Dell MD Signature Date/Time: 04/10/2021/2:35:18 PM    Final    US Abdomen Limited RUQ (LIVER/GB)  Result Date: 04/10/2021 CLINICAL DATA:  Increased bilirubin for 2 days EXAM: ULTRASOUND ABDOMEN LIMITED RIGHT UPPER QUADRANT COMPARISON:  Abdominal ultrasound 01/12/2020 FINDINGS: Gallbladder: There is layering sludge in the gallbladder. There is mild gallbladder wall thickening and small amount of pericholecystic fluid. No shadowing stones are seen. Sonographic Eulah Pont sign was reported negative Common bile duct: Diameter: 4 mm.  There is no intrahepatic biliary ductal dilatation. Liver: Parenchymal echogenicity is diffusely increased throughout the liver. No focal lesions are seen. Portal vein is patent on color Doppler imaging with normal direction of blood flow towards the liver. Other: None. IMPRESSION: 1. Layering sludge in the gallbladder with mild gallbladder wall thickening and small amount of pericholecystic fluid. Sonographic Eulah Pont sign was reported negative. If there is clinical concern for acute cholecystitis, HIDA scan may be obtained. 2. No intra or extrahepatic biliary ductal dilatation. 3. Severe fatty infiltration of the liver. Electronically Signed   By: Lesia Hausen M.D.   On: 04/10/2021 09:07  [2 weeks]   Assessment:  30 year old male with hx of significant alcohol abuse, elevated LFTs, depression/anxiety, A fib on Eliquis and digoxin, HTN, who presented to the ED yesterday for further evaluation of jaundice, with markedly elevated  LFTs, Hyperbilirubinemia, GI consulted for further eval.  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 AST significantly elevated on admission but seems to be improving. Blood alcohol level 491. UDS + opiates. Tylenol level normal. Last meth 01/2021. Viral hepatitis panel negative. U/S with severe fatty infiltration of the liver, w/o evidence of cirrhosis, gallbladder sludge with mild wall thickening and small amt of pericholecystic fluid, likely reactive. Was started on lactulose due to elevated ammonia level. No signs of HE. DF on admission 30.9, yesterday 34.8, prednisolone started. WBC/platelets low, likely due to etoh abuse. Patient begging to go home and told me he might have to leave AMA. His liver numbers are stabilizing and some improving but INR and Tbili have not trending downward yet. No clinical signs of liver failure.   Tbili: 14.3-->12.8-->15-->15.6 AP: 180-->175-->150-->130 AST: 922-->841-->615-->431 ALT: 307-->266-->225-->201 INR:1.4-->1.4-->1.5-->1.5  GERD/Nausea/ epigastric pain/dark stools: Poorly controlled GERD as outpatient, chronic symptoms since age 45. Per patient epigastric pain and UGI symptoms worse with etoh consumption. Denies NSAIDs. Has not been on Eliquis for Afib for over 2 months. PPI BID started. Hgb 12.6 on admission, down to 10.5 today.  B12 elevated. Folate low at 4.3. Hemoccult pending, patient declined rectal exam. He has also declined EGD.  Folate deficiency: folate 4.3. Folic acid 1mg  daily started.   Plan: Continue PPI BID.  Continue folic acid.  Monitor for overt GI bleeding.  Continue prednisolone, check for response at one week, if improved then continue for complete treatment (40 mg daily for 28 days, followed by a 2-to 4-week taper).  Alcohol cessation, patient will need behavioral management support. Consider rehab when stable for discharge. Patient  refusing.  Discussed with Dr.Carver, from a liver standpoint, things appear to be stable/improving.  Our concern is potential withdrawal when he leaves vs relapsing with etoh use.  He will need close follow up with labs in 5 days (11/22) and outpatient office visit, we will make arrangements.   12-03-2002. Leanna Battles [facility] [phone] 11/17/202212:18 PM    LOS: 3 days

## 2021-04-12 NOTE — Progress Notes (Signed)
Patient very agitated, anxious and insisting on going outside to smoke. He is hallucinating and asking if he can see his daughter, which he believes is in the hallway. Tried to reason and allow patient to go into the hallway. Attempted to call family but with no answer. Patient agreed to go back in room, administered 4mg  of IV ativan, and orders for precedex per provider.   Good effect noted from precedex. Initial titration started at 0.56mcg/min and currently up to 1.2 as patient continued to verbally make threats and remained agitated.

## 2021-04-12 NOTE — Telephone Encounter (Signed)
Patient needs hospital follow up for etoh hepatitis. Abnormal LFTS.  He also needs CBC, CMET, PT/INR 04/17/21.

## 2021-04-12 NOTE — Progress Notes (Addendum)
PROGRESS NOTE  Omar Mccarthy [id number] DOB: 01/06/1991 DOA: 04/09/2021 PCP: [facility]  Brief History:  30 y.o. male with medical history significant for alcohol abuse and intoxication, depression and anxiety, atrial fibrillation in the setting of alcohol intoxication, respiratory failure requiring intubation.   Patient presented to the ED reports of jaundice.  Patient went to see his cardiologist 3 days ago on Friday, he was found to be jaundiced, he had some blood work done and was told he was in liver failure  and was told to the ED, he reports due to personal matters he came to the ED 04/09/21.  He reports some difficulty breathing sometimes at rest and sometimes with exertion. He endorses significant alcohol intake, half a gallon of vodka every day.  His last drink was 1 hour prior to arrival in the ED this evening.  He reports he has been drinking due to his depression and is afraid to stop because he has gone through withdrawals in the past.   He endorses taking Vicodin 10 mg every day for chronic pain, this is not the prescribed medication.  He also takes Adderall also not prescribed.  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  Heart rate 88- 94.  Respiratory rate 14-20.  Blood pressure systolic [id number].  O2 sats greater than 97% on room air.  Markedly elevated liver enzymes, AST 922, ALT 307, ALP 180, total bilirubin 14.3.  Ammonia level 57.  BNP 58.  Chest x-ray without acute abnormality.  Blood alcohol level markedly elevated at 491.  Hepatitis panel ordered and pending. EDP talked to GI, recommended admission, ultrasound  GI continued to follow the patient.  Patient was started on prednisolone.  His transaminases and bili stabilized.  Unfortunately, on 04/12/21 afternoon, the patient began having agitation and confusion.  He was given multiple doses of  ativan and librium with minimal to no effect.   As a result, the patient was started on Precedex.  Assessment/Plan: Acute alcoholic hepatitis with alcohol intoxication -appreciate GI consult and follow up -pt was started on prednisolone -continue IVF -RUQ US--GB sludge with mild GB thickening with small amount pericholecystic fluid; neg Murphy's sign -LFTs and Bili has stabilized.  Alcohol withdrawal -04/12/21 afternoon--agitation and confusion.  He was given multiple doses of  ativan and librium with minimal to no effect>>started on Precedex.   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 -outpt follow up with cardiology   Macrocytic Anemia -continue folate supplementation   Hyperammonemia -continue lactulose -mental status stable       Status is: Inpatient   Remains inpatient appropriate because: severity of illness requiring specialist services not appropriate for outpatient follw up;  now having DTs      The patient is critically ill with multiple organ systems failure and requires high complexity decision making for assessment and support, frequent evaluation and titration of therapies, application of advanced monitoring technologies and extensive interpretation of multiple databases.  Critical care time - 35 mins.           Family Communication:  mother updated at bedside 11/16, attempted call 11/17   Consultants:  GI   Code Status:  FULL   DVT Prophylaxis:  SCDs     Procedures: As Listed in Progress Note Above   Antibiotics: None                  Subjective: Patient denies fevers,  chills, headache, chest pain, dyspnea, nausea, vomiting, diarrhea, abdominal pain, dysuria, hematuria, hematochezia, and melena. Patient is confused and agitated and tremulous  Objective: Vitals:   04/12/21 0300 04/12/21 0438 04/12/21 0809 04/12/21 1049  BP: (!) 151/103  122/83 140/79  Pulse:   77 86  Resp: 20  (!) 25   Temp:  97.8 F (36.6 C) 97.9 F (36.6 C)   TempSrc:  Oral Oral   SpO2:   99%   Weight:      Height:        Intake/Output Summary (Last 24 hours) at 04/12/2021 1215 Last data filed at 04/12/2021 0850 Gross per 24 hour  Intake 3110.17 ml  Output 1850 ml  Net 1260.17 ml   Weight change:  Exam:  General:  Pt is alert but confused, difficult to re-direct HEENT: No icterus, No thrush, No neck mass, [place]/AT Cardiovascular: RRR, S1/S2, no rubs, no gallops Respiratory: bibasilar rales. No wheeze Abdomen: Soft/+BS, non tender, non distended, no guarding Extremities: No edema, No lymphangitis, No petechiae, No rashes, no synovitis   Data Reviewed: I have personally reviewed following labs and imaging studies Basic Metabolic Panel: Recent Labs  Lab 04/09/21 2021 04/10/21 0426 04/11/21 0420 04/12/21 0427  NA 137 137 133* 130*  K 3.7 3.5 3.8 4.2  CL 98 100 102 103  CO2 23 25 22  17*  GLUCOSE 120* 120* 85 185*  BUN 7 7 6 8   CREATININE 0.32* 0.44* <0.30* 0.32*  CALCIUM 8.2* 7.9* 8.0* 8.2*  MG 1.6* 1.8 1.8 1.6*  PHOS 3.2  --   --   --    Liver Function Tests: Recent Labs  Lab 04/09/21 2021 04/10/21 0426 04/11/21 0420 04/12/21 0427  AST 922* 841* 615* 431*  ALT 307* 266* 225* 201*  ALKPHOS 180* 175* 150* 130*  BILITOT 14.3* 12.8* 15.0* 15.6*  PROT 7.2 6.2* 6.0* 6.1*  ALBUMIN 3.2* 2.8* 2.7* 2.7*   Recent Labs  Lab 04/09/21 2021  LIPASE 50   Recent Labs  Lab 04/09/21 2021 04/10/21 0819 04/11/21 0420 04/12/21 0427  AMMONIA 57* 75* 56* 73*   Coagulation Profile: Recent Labs  Lab 04/09/21 2021 04/10/21 0426 04/11/21 0420 04/12/21 0427   INR 1.4* 1.4* 1.5* 1.5*   CBC: Recent Labs  Lab 04/09/21 2021 04/10/21 0426 04/11/21 0420 04/12/21 0427  WBC 3.9* 4.7 3.9* 3.6*  NEUTROABS 2.4  --   --   --   HGB 12.6* 11.4* 10.4* 10.5*  HCT 36.8* 33.0* 31.0* 31.2*  MCV 103.7* 104.1* 104.4* 107.6*  PLT 106* 95* 75* 81*   Cardiac Enzymes: No results for input(s): CKTOTAL, CKMB, CKMBINDEX, TROPONINI in the last 168 hours. BNP: Invalid input(s): POCBNP CBG: No results for input(s): GLUCAP in the last 168 hours. HbA1C: Recent Labs    04/10/21 0819  HGBA1C 4.7*   Urine analysis:    Component Value Date/Time   COLORURINE AMBER (A) 04/10/2021 0003   APPEARANCEUR HAZY (A) 04/10/2021 0003   LABSPEC 1.017 04/10/2021 0003   PHURINE 6.0 04/10/2021 0003   GLUCOSEU NEGATIVE 04/10/2021 0003   HGBUR SMALL (A) 04/10/2021 0003   BILIRUBINUR MODERATE (A) 04/10/2021 0003  KETONESUR NEGATIVE 04/10/2021 0003   PROTEINUR 100 (A) 04/10/2021 0003   NITRITE NEGATIVE 04/10/2021 0003   LEUKOCYTESUR NEGATIVE 04/10/2021 0003   Sepsis Labs: @LABRCNTIP (procalcitonin:4,lacticidven:4) ) Recent Results (from the past 240 hour(s))  Resp Panel by RT-PCR (Flu A&B, Covid) Nasopharyngeal Swab     Status: None   Collection Time: 04/09/21  8:29 PM   Specimen: Nasopharyngeal Swab; Nasopharyngeal(NP) swabs in vial transport medium  Result Value Ref Range Status   SARS Coronavirus 2 by RT PCR NEGATIVE NEGATIVE Final    Comment: (NOTE) SARS-CoV-2 target nucleic acids are NOT DETECTED.  The SARS-CoV-2 RNA is generally detectable in upper respiratory specimens during the acute phase of infection. The lowest concentration of SARS-CoV-2 viral copies this assay can detect is 138 copies/mL. A negative result does not preclude SARS-Cov-2 infection and should not be used as the sole basis for treatment or other patient management decisions. A negative result may occur with  improper specimen collection/handling, submission of specimen other than  nasopharyngeal swab, presence of viral mutation(s) within the areas targeted by this assay, and inadequate number of viral copies(<138 copies/mL). A negative result must be combined with clinical observations, patient history, and epidemiological information. The expected result is Negative.  Fact Sheet for Patients:  EntrepreneurPulse.com.au  Fact Sheet for Healthcare Providers:  IncredibleEmployment.be  This test is no t yet approved or cleared by the [place] FDA and  has been authorized for detection and/or diagnosis of SARS-CoV-2 by FDA under an Emergency Use Authorization (EUA). This EUA will remain  in effect (meaning this test can be used) for the duration of the COVID-19 declaration under Section 564(b)(1) of the Act, 21 U.S.C.section 360bbb-3(b)(1), unless the authorization is terminated  or revoked sooner.       Influenza A by PCR NEGATIVE NEGATIVE Final   Influenza B by PCR NEGATIVE NEGATIVE Final    Comment: (NOTE) The Xpert Xpress SARS-CoV-2/FLU/RSV plus assay is intended as an aid in the diagnosis of influenza from Nasopharyngeal swab specimens and should not be used as a sole basis for treatment. Nasal washings and aspirates are unacceptable for Xpert Xpress SARS-CoV-2/FLU/RSV testing.  Fact Sheet for Patients: EntrepreneurPulse.com.au  Fact Sheet for Healthcare Providers: IncredibleEmployment.be  This test is not yet approved or cleared by the [place] FDA and has been authorized for detection and/or diagnosis of SARS-CoV-2 by FDA under an Emergency Use Authorization (EUA). This EUA will remain in effect (meaning this test can be used) for the duration of the COVID-19 declaration under Section 564(b)(1) of the Act, 21 U.S.C. section 360bbb-3(b)(1), unless the authorization is terminated or revoked.  Performed at [facility], [address], [place], [place] [address]   MRSA  Next Gen by PCR, Nasal     Status: None   Collection Time: 04/10/21  4:21 PM   Specimen: Nasal Mucosa; Nasal Swab  Result Value Ref Range Status   MRSA by PCR Next Gen NOT DETECTED NOT DETECTED Final    Comment: (NOTE) The GeneXpert MRSA Assay (FDA approved for NASAL specimens only), is one component of a comprehensive MRSA colonization surveillance program. It is not intended to diagnose MRSA infection nor to guide or monitor treatment for MRSA infections. Test performance is not FDA approved in patients less than 78 years old. Performed at [facility], [address] [place], [place] [address]      Scheduled Meds:  chlordiazePOXIDE  25 mg Oral TID   Chlorhexidine Gluconate Cloth  6 each Topical Daily   digoxin  0.125 mg Oral Daily   folic acid  1 mg Oral Daily   lactulose  20 g Oral Daily   LORazepam  0-4 mg Intravenous Q12H   metoprolol succinate  50 mg Oral Daily   multivitamin with minerals  1 tablet Oral Daily   nicotine  21 mg Transdermal Daily   pantoprazole (PROTONIX) IV  40 mg Intravenous Q12H   prednisoLONE  40 mg Oral Q breakfast   thiamine  100 mg Oral Daily   Or   thiamine  100 mg Intravenous Daily   Continuous Infusions:  0.9 % NaCl with KCl 20 mEq / L 100 mL/hr at 04/12/21 0830   dexmedetomidine (PRECEDEX) IV infusion      Procedures/Studies: DG Chest Portable 1 View  Result Date: 04/09/2021 CLINICAL DATA:  Shortness of breath, abdominal pain EXAM: PORTABLE CHEST 1 VIEW COMPARISON:  01/16/2020 FINDINGS: Heart and mediastinal contours are within normal limits. No focal opacities or effusions. No acute bony abnormality. IMPRESSION: No active disease. Electronically Signed   By: Rolm Baptise M.D.   On: 04/09/2021 20:36   ECHOCARDIOGRAM COMPLETE  Result Date: 04/10/2021    ECHOCARDIOGRAM REPORT   Patient Name:   Omar Mccarthy Date of Exam: 04/10/2021 Medical Rec #:  [id number]        Height:       69.0 in Accession #:    [id number]       Weight:       218.0  lb Date of Birth:  1991-05-05        BSA:          2.143 m Patient Age:    30 years         BP:           140/84 mmHg Patient Gender: M                HR:           85 bpm. Exam Location:  [facility] Procedure: 2D Echo, Cardiac Doppler and Color Doppler Indications:    Cardiomyopathy  History:        Patient has prior history of Echocardiogram examinations, most                 recent 01/13/2020. CHF, Arrythmias:Atrial Fibrillation;                 Signs/Symptoms:Chest Pain. ETOH ABUSE.  Sonographer:    Wenda Low Referring Phys: [place]  1. Left ventricular ejection fraction, by estimation, is 70 to 75%. The left ventricle has hyperdynamic function. The left ventricle has no regional wall motion abnormalities. There is moderate asymmetric left ventricular hypertrophy of the basal septal  segment. Left ventricular diastolic parameters were normal. Elevated left atrial pressure. Mitral chordal SAM is noted with increased LVOT gradient of 60-100 mmHg. This is consistent with outflow obstruction and more of a hypertrophic cardiomyopathy picture than the prior study.  2. Right ventricular systolic function is normal. The right ventricular size is normal. There is normal pulmonary artery systolic pressure. The estimated right ventricular systolic pressure is [id number] mmHg.  3. Left atrial size was moderately dilated.  4. The mitral valve is abnormal. Moderate mitral valve regurgitation.  5. The aortic valve is tricuspid. Aortic valve regurgitation is not visualized.  6. The inferior vena cava is normal in size with greater than 50% respiratory variability, suggesting right atrial pressure of 3 mmHg. Comparison(s): Prior images reviewed side by side. LVEF  is now vigorous and more consistent with a hypertrophic cardiomyopathy picture than dilated cardiomyopathy. FINDINGS  Left Ventricle: Left ventricular ejection fraction, by estimation, is 70 to 75%. The left ventricle has hyperdynamic  function. The left ventricle has no regional wall motion abnormalities. The left ventricular internal cavity size was normal in size. There is moderate asymmetric left ventricular hypertrophy of the septal and basal segments. Left ventricular diastolic parameters were normal. Elevated left atrial pressure. Right Ventricle: The right ventricular size is normal. No increase in right ventricular wall thickness. Right ventricular systolic function is normal. There is normal pulmonary artery systolic pressure. The tricuspid regurgitant velocity is 2.56 m/s, and  with an assumed right atrial pressure of 3 mmHg, the estimated right ventricular systolic pressure is [id number] mmHg. Left Atrium: Left atrial size was moderately dilated. Right Atrium: Right atrial size was normal in size. Pericardium: There is no evidence of pericardial effusion. Mitral Valve: The mitral valve is abnormal. There is mild thickening of the mitral valve leaflet(s). Moderate mitral valve regurgitation, with eccentric posteriorly directed jet. MV peak gradient, 16.2 mmHg. The mean mitral valve gradient is 6.0 mmHg. Tricuspid Valve: The tricuspid valve is grossly normal. Tricuspid valve regurgitation is trivial. Aortic Valve: The aortic valve is tricuspid. There is mild aortic valve annular calcification. Aortic valve regurgitation is not visualized. Aortic valve mean gradient measures 12.5 mmHg. Aortic valve peak gradient measures 27.1 mmHg. Aortic valve area, by VTI measures 2.62 cm. Pulmonic Valve: The pulmonic valve was grossly normal. Pulmonic valve regurgitation is trivial. Aorta: The aortic root is normal in size and structure. Venous: The inferior vena cava is normal in size with greater than 50% respiratory variability, suggesting right atrial pressure of 3 mmHg. IAS/Shunts: No atrial level shunt detected by color flow Doppler.  LEFT VENTRICLE PLAX 2D LVIDd:         5.40 cm      Diastology LVIDs:         2.60 cm      LV e' medial:    8.81 cm/s  LV PW:         1.15 cm      LV E/e' medial:  16.3 LV IVS:        1.20 cm      LV e' lateral:   11.30 cm/s LVOT diam:     2.10 cm      LV E/e' lateral: 12.7 LV SV:         128 LV SV Index:   60 LVOT Area:     3.46 cm  LV Volumes (MOD) LV vol d, MOD A2C: 92.2 ml LV vol d, MOD A4C: 106.0 ml LV vol s, MOD A2C: 33.3 ml LV vol s, MOD A4C: 47.9 ml LV SV MOD A2C:     58.9 ml LV SV MOD A4C:     106.0 ml LV SV MOD BP:      60.1 ml RIGHT VENTRICLE RV Basal diam:  3.10 cm RV Mid diam:    2.90 cm LEFT ATRIUM              Index        RIGHT ATRIUM           Index LA diam:        4.85 cm  2.26 cm/m   RA Area:     16.20 cm LA Vol (A2C):   110.0 ml 51.33 ml/m  RA Volume:   36.20 ml  16.89 ml/m LA Vol (A4C):  108.0 ml 50.39 ml/m LA Biplane Vol: 109.0 ml 50.86 ml/m  AORTIC VALVE                     PULMONIC VALVE AV Area (Vmax):    3.00 cm      PV Vmax:       1.12 m/s AV Area (Vmean):   3.30 cm      PV Peak grad:  5.0 mmHg AV Area (VTI):     2.62 cm AV Vmax:           260.50 cm/s AV Vmean:          163.500 cm/s AV VTI:            0.491 m AV Peak Grad:      27.1 mmHg AV Mean Grad:      12.5 mmHg LVOT Vmax:         225.50 cm/s LVOT Vmean:        156.000 cm/s LVOT VTI:          0.371 m LVOT/AV VTI ratio: 0.76 MITRAL VALVE                TRICUSPID VALVE MV Area (PHT): 4.54 cm     TR Peak grad:   26.2 mmHg MV Area VTI:   4.22 cm     TR Vmax:        256.00 cm/s MV Peak grad:  16.2 mmHg MV Mean grad:  6.0 mmHg     SHUNTS MV Vmax:       2.01 m/s     Systemic VTI:  0.37 m MV Vmean:      108.0 cm/s   Systemic Diam: 2.10 cm MV Decel Time: 167 msec MV E velocity: 144.00 cm/s MV A velocity: 99.50 cm/s MV E/A ratio:  1.45 Rozann Lesches MD Electronically signed by Rozann Lesches MD Signature Date/Time: 04/10/2021/2:35:18 PM    Final    US Abdomen Limited RUQ (LIVER/GB)  Result Date: 04/10/2021 CLINICAL DATA:  Increased bilirubin for 2 days EXAM: ULTRASOUND ABDOMEN LIMITED RIGHT UPPER QUADRANT COMPARISON:  Abdominal ultrasound  01/12/2020 FINDINGS: Gallbladder: There is layering sludge in the gallbladder. There is mild gallbladder wall thickening and small amount of pericholecystic fluid. No shadowing stones are seen. Sonographic Percell Miller sign was reported negative Common bile duct: Diameter: 4 mm.  There is no intrahepatic biliary ductal dilatation. Liver: Parenchymal echogenicity is diffusely increased throughout the liver. No focal lesions are seen. Portal vein is patent on color Doppler imaging with normal direction of blood flow towards the liver. Other: None. IMPRESSION: 1. Layering sludge in the gallbladder with mild gallbladder wall thickening and small amount of pericholecystic fluid. Sonographic Percell Miller sign was reported negative. If there is clinical concern for acute cholecystitis, HIDA scan may be obtained. 2. No intra or extrahepatic biliary ductal dilatation. 3. Severe fatty infiltration of the liver. Electronically Signed   By: Valetta Mole M.D.   On: 04/10/2021 09:07    Orson Eva, DO  [facility]  If 7PM-7AM, please contact night-coverage [email or web address] Password [email or web address] 04/12/2021, 12:15 PM   LOS: 3 days

## 2021-04-12 NOTE — Progress Notes (Signed)
Patient noted confused & trying to wander & leave his room, staff at bedside attempting to redirect the patient, this RN notified patient's significant other (Tiffany) no answer & called patient's mom & VM left

## 2021-04-13 ENCOUNTER — Other Ambulatory Visit (INDEPENDENT_AMBULATORY_CARE_PROVIDER_SITE_OTHER): Payer: Self-pay

## 2021-04-13 DIAGNOSIS — K7010 Alcoholic hepatitis without ascites: Secondary | ICD-10-CM

## 2021-04-13 DIAGNOSIS — F419 Anxiety disorder, unspecified: Secondary | ICD-10-CM

## 2021-04-13 DIAGNOSIS — R7989 Other specified abnormal findings of blood chemistry: Secondary | ICD-10-CM

## 2021-04-13 DIAGNOSIS — F32A Depression, unspecified: Secondary | ICD-10-CM

## 2021-04-13 LAB — CBC
HCT: 31.9 % — ABNORMAL LOW (ref 39.0–52.0)
Hemoglobin: 10.5 g/dL — ABNORMAL LOW (ref 13.0–17.0)
MCH: 36.2 pg — ABNORMAL HIGH (ref 26.0–34.0)
MCHC: 32.9 g/dL (ref 30.0–36.0)
MCV: 110 fL — ABNORMAL HIGH (ref 80.0–100.0)
Platelets: 88 10*3/uL — ABNORMAL LOW (ref 150–400)
RBC: 2.9 MIL/uL — ABNORMAL LOW (ref 4.22–5.81)
RDW: 12.6 % (ref 11.5–15.5)
WBC: 5.2 10*3/uL (ref 4.0–10.5)
nRBC: 0 % (ref 0.0–0.2)

## 2021-04-13 LAB — COMPREHENSIVE METABOLIC PANEL
ALT: 188 U/L — ABNORMAL HIGH (ref 0–44)
AST: 289 U/L — ABNORMAL HIGH (ref 15–41)
Albumin: 2.7 g/dL — ABNORMAL LOW (ref 3.5–5.0)
Alkaline Phosphatase: 129 U/L — ABNORMAL HIGH (ref 38–126)
Anion gap: 7 (ref 5–15)
BUN: 8 mg/dL (ref 6–20)
CO2: 19 mmol/L — ABNORMAL LOW (ref 22–32)
Calcium: 8.1 mg/dL — ABNORMAL LOW (ref 8.9–10.3)
Chloride: 106 mmol/L (ref 98–111)
Creatinine, Ser: <0.3 mg/dL — ABNORMAL LOW (ref 0.61–1.24)
Glucose, Bld: 124 mg/dL — ABNORMAL HIGH (ref 70–99)
Potassium: 4 mmol/L (ref 3.5–5.1)
Sodium: 132 mmol/L — ABNORMAL LOW (ref 135–145)
Total Bilirubin: 13.7 mg/dL — ABNORMAL HIGH (ref 0.3–1.2)
Total Protein: 6.2 g/dL — ABNORMAL LOW (ref 6.5–8.1)

## 2021-04-13 LAB — AMMONIA: Ammonia: 92 umol/L — ABNORMAL HIGH (ref 9–35)

## 2021-04-13 MED ORDER — LORAZEPAM 2 MG/ML IJ SOLN
2.0000 mg | INTRAMUSCULAR | Status: DC | PRN
  Administered 2021-04-13 – 2021-04-14 (×4): 2 mg via INTRAVENOUS
  Filled 2021-04-13 (×4): qty 1

## 2021-04-13 NOTE — Progress Notes (Addendum)
PROGRESS NOTE  Omar Mccarthy [id number] DOB: 1991/02/05 DOA: 04/09/2021 PCP: [facility]   Brief History:  30 y.o. male with medical history significant for alcohol abuse and intoxication, depression and anxiety, atrial fibrillation in the setting of alcohol intoxication, respiratory failure requiring intubation.   Patient presented to the ED reports of jaundice.  Patient went to see his cardiologist 3 days ago on Friday, he was found to be jaundiced, he had some blood work done and was told he was in liver failure  and was told to the ED, he reports due to personal matters he came to the ED 04/09/21.  He reports some difficulty breathing sometimes at rest and sometimes with exertion. He endorses significant alcohol intake, half a gallon of vodka every day.  His last drink was 1 hour prior to arrival in the ED this evening.  He reports he has been drinking due to his depression and is afraid to stop because he has gone through withdrawals in the past.   He endorses taking Vicodin 10 mg every day for chronic pain, this is not the prescribed medication.  He also takes Adderall also not prescribed.  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  Heart rate 88- 94.  Respiratory rate 14-20.  Blood pressure systolic [id number].  O2 sats greater than 97% on room air.  Markedly elevated liver enzymes, AST 922, ALT 307, ALP 180, total bilirubin 14.3.  Ammonia level 57.  BNP 58.  Chest x-ray without acute abnormality.  Blood alcohol level markedly elevated at 491.  Hepatitis panel ordered and pending. EDP talked to GI, recommended admission, ultrasound   GI continued to follow the patient.  Patient was started on prednisolone.  His transaminases and bili stabilized.  Unfortunately, on 04/12/21 afternoon, the patient began having agitation and confusion.  He was given multiple doses of  ativan and librium with minimal to no  effect.  As a result, the patient was started on Precedex.   Assessment/Plan: Acute alcoholic hepatitis with alcohol intoxication -appreciate GI consult and follow up -pt was started on prednisolone -continue IVF -RUQ US--GB sludge with mild GB thickening with small amount pericholecystic fluid; neg Murphy's sign -LFTs and Bili has stabilized>>improving   Alcohol withdrawal/Delirium Tremens -04/12/21 afternoon--agitation and confusion.  He was given multiple doses of  ativan and librium with minimal to no effect>>started on Precedex. -11/18 AM--weaned off Precedex -start prn ativan   Chronic HFrEF -clinically compensated -04/10/21 Echo EF 70-75%, hypertrophic CM -01/13/20 Echo EF 40%   PAF -s/p DCCV 01/2020 -remains in sinus -poor candidate for anticoagulation in setting of Etoh abuse and hepatitis -continue metoprolol succinate   Thrombocytopenia -due to myelosuppression from Etoh -B12--1203 -folate--4.3>>replete -TSH   Alcohol Abuse -CIWA -cessation discussed   Hypomagnesemia -repleted -recheck in am   Hypertrophic Cardiomyopathy -noted on echo 04/10/21 -outpt follow up with cardiology   Macrocytic Anemia -continue folate supplementation   Hyperammonemia -continue lactulose current dose -mental status stable -will not increase dose as his mental status remains stable and he is A&O x 4  Pancytopenia -due to Etoh       Status is: Inpatient   Remains inpatient appropriate because: severity of illness requiring specialist services not appropriate for outpatient follw up;  now having DTs              Family Communication:   mother updated at  bedside 11/18   Consultants:  GI   Code Status:  FULL   DVT Prophylaxis:  SCDs     Procedures: As Listed in Progress Note Above   Antibiotics: None     Subjective: Patient denies fevers, chills, headache, chest pain, dyspnea, nausea, vomiting, diarrhea, abdominal pain, dysuria, hematuria,  hematochezia, and melena.    Objective: Vitals:   04/13/21 0645 04/13/21 0755 04/13/21 1150 04/13/21 1230  BP: (!) 152/96   (!) 131/93  Pulse: (!) 46   63  Resp: 17     Temp:  97.8 F (36.6 C) (!) 97.1 F (36.2 C)   TempSrc:  Axillary Axillary   SpO2: 100%     Weight:      Height:        Intake/Output Summary (Last 24 hours) at 04/13/2021 1406 Last data filed at 04/13/2021 1323 Gross per 24 hour  Intake 1907.76 ml  Output 1000 ml  Net 907.76 ml   Weight change:  Exam:  General:  Pt is alert, follows commands appropriately, not in acute distress HEENT: No icterus, No thrush, No neck mass, [place]/AT Cardiovascular: RRR, S1/S2, no rubs, no gallops Respiratory: CTA bilaterally, no wheezing, no crackles, no rhonchi Abdomen: Soft/+BS, non tender, non distended, no guarding Extremities: No edema, No lymphangitis, No petechiae, No rashes, no synovitis   Data Reviewed: I have personally reviewed following labs and imaging studies Basic Metabolic Panel: Recent Labs  Lab 04/09/21 2021 04/10/21 0426 04/11/21 0420 04/12/21 0427 04/13/21 0418  NA 137 137 133* 130* 132*  K 3.7 3.5 3.8 4.2 4.0  CL 98 100 102 103 106  CO2 23 25 22  17* 19*  GLUCOSE 120* 120* 85 185* 124*  BUN 7 7 6 8 8   CREATININE 0.32* 0.44* <0.30* 0.32* <0.30*  CALCIUM 8.2* 7.9* 8.0* 8.2* 8.1*  MG 1.6* 1.8 1.8 1.6*  --   PHOS 3.2  --   --   --   --    Liver Function Tests: Recent Labs  Lab 04/09/21 2021 04/10/21 0426 04/11/21 0420 04/12/21 0427 04/13/21 0418  AST 922* 841* 615* 431* 289*  ALT 307* 266* 225* 201* 188*  ALKPHOS 180* 175* 150* 130* 129*  BILITOT 14.3* 12.8* 15.0* 15.6* 13.7*  PROT 7.2 6.2* 6.0* 6.1* 6.2*  ALBUMIN 3.2* 2.8* 2.7* 2.7* 2.7*   Recent Labs  Lab 04/09/21 2021  LIPASE 50   Recent Labs  Lab 04/09/21 2021 04/10/21 0819 04/11/21 0420 04/12/21 0427 04/13/21 0418  AMMONIA 57* 75* 56* 73* 92*   Coagulation Profile: Recent Labs  Lab 04/09/21 2021 04/10/21 0426  04/11/21 0420 04/12/21 0427  INR 1.4* 1.4* 1.5* 1.5*   CBC: Recent Labs  Lab 04/09/21 2021 04/10/21 0426 04/11/21 0420 04/12/21 0427 04/13/21 0418  WBC 3.9* 4.7 3.9* 3.6* 5.2  NEUTROABS 2.4  --   --   --   --   HGB 12.6* 11.4* 10.4* 10.5* 10.5*  HCT 36.8* 33.0* 31.0* 31.2* 31.9*  MCV 103.7* 104.1* 104.4* 107.6* 110.0*  PLT 106* 95* 75* 81* 88*   Cardiac Enzymes: No results for input(s): CKTOTAL, CKMB, CKMBINDEX, TROPONINI in the last 168 hours. BNP: Invalid input(s): POCBNP CBG: No results for input(s): GLUCAP in the last 168 hours. HbA1C: No results for input(s): HGBA1C in the last 72 hours. Urine analysis:    Component Value Date/Time   COLORURINE AMBER (A) 04/10/2021 0003   APPEARANCEUR HAZY (A) 04/10/2021 0003   LABSPEC 1.017 04/10/2021 0003   PHURINE 6.0 04/10/2021 0003  GLUCOSEU NEGATIVE 04/10/2021 0003   HGBUR SMALL (A) 04/10/2021 0003   BILIRUBINUR MODERATE (A) 04/10/2021 0003   KETONESUR NEGATIVE 04/10/2021 0003   PROTEINUR 100 (A) 04/10/2021 0003   NITRITE NEGATIVE 04/10/2021 0003   LEUKOCYTESUR NEGATIVE 04/10/2021 0003   Sepsis Labs: @LABRCNTIP (procalcitonin:4,lacticidven:4) ) Recent Results (from the past 240 hour(s))  Resp Panel by RT-PCR (Flu A&B, Covid) Nasopharyngeal Swab     Status: None   Collection Time: 04/09/21  8:29 PM   Specimen: Nasopharyngeal Swab; Nasopharyngeal(NP) swabs in vial transport medium  Result Value Ref Range Status   SARS Coronavirus 2 by RT PCR NEGATIVE NEGATIVE Final    Comment: (NOTE) SARS-CoV-2 target nucleic acids are NOT DETECTED.  The SARS-CoV-2 RNA is generally detectable in upper respiratory specimens during the acute phase of infection. The lowest concentration of SARS-CoV-2 viral copies this assay can detect is 138 copies/mL. A negative result does not preclude SARS-Cov-2 infection and should not be used as the sole basis for treatment or other patient management decisions. A negative result may occur with   improper specimen collection/handling, submission of specimen other than nasopharyngeal swab, presence of viral mutation(s) within the areas targeted by this assay, and inadequate number of viral copies(<138 copies/mL). A negative result must be combined with clinical observations, patient history, and epidemiological information. The expected result is Negative.  Fact Sheet for Patients:  EntrepreneurPulse.com.au  Fact Sheet for Healthcare Providers:  IncredibleEmployment.be  This test is no t yet approved or cleared by the [place] FDA and  has been authorized for detection and/or diagnosis of SARS-CoV-2 by FDA under an Emergency Use Authorization (EUA). This EUA will remain  in effect (meaning this test can be used) for the duration of the COVID-19 declaration under Section 564(b)(1) of the Act, 21 U.S.C.section 360bbb-3(b)(1), unless the authorization is terminated  or revoked sooner.       Influenza A by PCR NEGATIVE NEGATIVE Final   Influenza B by PCR NEGATIVE NEGATIVE Final    Comment: (NOTE) The Xpert Xpress SARS-CoV-2/FLU/RSV plus assay is intended as an aid in the diagnosis of influenza from Nasopharyngeal swab specimens and should not be used as a sole basis for treatment. Nasal washings and aspirates are unacceptable for Xpert Xpress SARS-CoV-2/FLU/RSV testing.  Fact Sheet for Patients: EntrepreneurPulse.com.au  Fact Sheet for Healthcare Providers: IncredibleEmployment.be  This test is not yet approved or cleared by the [place] FDA and has been authorized for detection and/or diagnosis of SARS-CoV-2 by FDA under an Emergency Use Authorization (EUA). This EUA will remain in effect (meaning this test can be used) for the duration of the COVID-19 declaration under Section 564(b)(1) of the Act, 21 U.S.C. section 360bbb-3(b)(1), unless the authorization is terminated  or revoked.  Performed at [facility], [address], [place], [place] [address]   MRSA Next Gen by PCR, Nasal     Status: None   Collection Time: 04/10/21  4:21 PM   Specimen: Nasal Mucosa; Nasal Swab  Result Value Ref Range Status   MRSA by PCR Next Gen NOT DETECTED NOT DETECTED Final    Comment: (NOTE) The GeneXpert MRSA Assay (FDA approved for NASAL specimens only), is one component of a comprehensive MRSA colonization surveillance program. It is not intended to diagnose MRSA infection nor to guide or monitor treatment for MRSA infections. Test performance is not FDA approved in patients less than 16 years old. Performed at [facility], [address], [place], [place] [address]      Scheduled Meds:  chlordiazePOXIDE  25 mg Oral TID   Chlorhexidine Gluconate Cloth  6 each Topical Daily   digoxin  0.125 mg Oral Daily   folic acid  1 mg Oral Daily   lactulose  20 g Oral Daily   LORazepam  0-4 mg Intravenous Q12H   metoprolol succinate  50 mg Oral Daily   multivitamin with minerals  1 tablet Oral Daily   nicotine  21 mg Transdermal Daily   pantoprazole (PROTONIX) IV  40 mg Intravenous Q12H   prednisoLONE  40 mg Oral Q breakfast   thiamine  100 mg Oral Daily   Or   thiamine  100 mg Intravenous Daily   Continuous Infusions:  0.9 % NaCl with KCl 20 mEq / L 100 mL/hr at 04/13/21 1032   dexmedetomidine (PRECEDEX) IV infusion Stopped (04/13/21 0747)    Procedures/Studies: DG Chest Portable 1 View  Result Date: 04/09/2021 CLINICAL DATA:  Shortness of breath, abdominal pain EXAM: PORTABLE CHEST 1 VIEW COMPARISON:  01/16/2020 FINDINGS: Heart and mediastinal contours are within normal limits. No focal opacities or effusions. No acute bony abnormality. IMPRESSION: No active disease. Electronically Signed   By: Rolm Baptise M.D.   On: 04/09/2021 20:36   ECHOCARDIOGRAM COMPLETE  Result Date: 04/10/2021    ECHOCARDIOGRAM REPORT   Patient Name:   ROC TEEM Fricke Date of Exam:  04/10/2021 Medical Rec #:  [id number]        Height:       69.0 in Accession #:    [id number]       Weight:       218.0 lb Date of Birth:  1991/05/22        BSA:          2.143 m Patient Age:    30 years         BP:           140/84 mmHg Patient Gender: M                HR:           85 bpm. Exam Location:  [facility] Procedure: 2D Echo, Cardiac Doppler and Color Doppler Indications:    Cardiomyopathy  History:        Patient has prior history of Echocardiogram examinations, most                 recent 01/13/2020. CHF, Arrythmias:Atrial Fibrillation;                 Signs/Symptoms:Chest Pain. ETOH ABUSE.  Sonographer:    Wenda Low Referring Phys: [place]  1. Left ventricular ejection fraction, by estimation, is 70 to 75%. The left ventricle has hyperdynamic function. The left ventricle has no regional wall motion abnormalities. There is moderate asymmetric left ventricular hypertrophy of the basal septal  segment. Left ventricular diastolic parameters were normal. Elevated left atrial pressure. Mitral chordal SAM is noted with increased LVOT gradient of 60-100 mmHg. This is consistent with outflow obstruction and more of a hypertrophic cardiomyopathy picture than the prior study.  2. Right ventricular systolic function is normal. The right ventricular size is normal. There is normal pulmonary artery systolic pressure. The estimated right ventricular systolic pressure is [id number] mmHg.  3. Left atrial size was moderately dilated.  4. The mitral valve is abnormal. Moderate mitral valve regurgitation.  5. The aortic valve is tricuspid. Aortic valve regurgitation is not visualized.  6. The inferior vena cava is normal in size  with greater than 50% respiratory variability, suggesting right atrial pressure of 3 mmHg. Comparison(s): Prior images reviewed side by side. 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 FINDINGS  Left  Ventricle: Left ventricular ejection fraction, by estimation, is 70 to 75%. The left ventricle has hyperdynamic function. The left ventricle has no regional wall motion abnormalities. The left ventricular internal cavity size was normal in size. There is moderate asymmetric left ventricular hypertrophy of the septal and basal segments. Left ventricular diastolic parameters were normal. Elevated left atrial pressure. Right Ventricle: The right ventricular size is normal. No increase in right ventricular wall thickness. Right ventricular systolic function is normal. There is normal pulmonary artery systolic pressure. The tricuspid regurgitant velocity is 2.56 m/s, and  with an assumed right atrial pressure of 3 mmHg, the estimated right ventricular systolic pressure is [id number] mmHg. Left Atrium: Left atrial size was moderately dilated. Right Atrium: Right atrial size was normal in size. Pericardium: There is no evidence of pericardial effusion. Mitral Valve: The mitral valve is abnormal. There is mild thickening of the mitral valve leaflet(s). Moderate mitral valve regurgitation, with eccentric posteriorly directed jet. MV peak gradient, 16.2 mmHg. The mean mitral valve gradient is 6.0 mmHg. Tricuspid Valve: The tricuspid valve is grossly normal. Tricuspid valve regurgitation is trivial. Aortic Valve: The aortic valve is tricuspid. There is mild aortic valve annular calcification. Aortic valve regurgitation is not visualized. Aortic valve mean gradient measures 12.5 mmHg. Aortic valve peak gradient measures 27.1 mmHg. Aortic valve area, by VTI measures 2.62 cm. Pulmonic Valve: The pulmonic valve was grossly normal. Pulmonic valve regurgitation is trivial. Aorta: The aortic root is normal in size and structure. Venous: The inferior vena cava is normal in size with greater than 50% respiratory variability, suggesting right atrial pressure of 3 mmHg. IAS/Shunts: No atrial level shunt detected by color flow Doppler.  LEFT  VENTRICLE PLAX 2D LVIDd:         5.40 cm      Diastology LVIDs:         2.60 cm      LV e' medial:    8.81 cm/s LV PW:         1.15 cm      LV E/e' medial:  16.3 LV IVS:        1.20 cm      LV e' lateral:   11.30 cm/s LVOT diam:     2.10 cm      LV E/e' lateral: 12.7 LV SV:         128 LV SV Index:   60 LVOT Area:     3.46 cm  LV Volumes (MOD) LV vol d, MOD A2C: 92.2 ml LV vol d, MOD A4C: 106.0 ml LV vol s, MOD A2C: 33.3 ml LV vol s, MOD A4C: 47.9 ml LV SV MOD A2C:     58.9 ml LV SV MOD A4C:     106.0 ml LV SV MOD BP:      60.1 ml RIGHT VENTRICLE RV Basal diam:  3.10 cm RV Mid diam:    2.90 cm LEFT ATRIUM              Index        RIGHT ATRIUM           Index LA diam:        4.85 cm  2.26 cm/m   RA Area:     16.20 cm LA  Vol (A2C):   110.0 ml 51.33 ml/m  RA Volume:   36.20 ml  16.89 ml/m LA Vol (A4C):   108.0 ml 50.39 ml/m LA Biplane Vol: 109.0 ml 50.86 ml/m  AORTIC VALVE                     PULMONIC VALVE AV Area (Vmax):    3.00 cm      PV Vmax:       1.12 m/s AV Area (Vmean):   3.30 cm      PV Peak grad:  5.0 mmHg AV Area (VTI):     2.62 cm AV Vmax:           260.50 cm/s AV Vmean:          163.500 cm/s AV VTI:            0.491 m AV Peak Grad:      27.1 mmHg AV Mean Grad:      12.5 mmHg LVOT Vmax:         225.50 cm/s LVOT Vmean:        156.000 cm/s LVOT VTI:          0.371 m LVOT/AV VTI ratio: 0.76 MITRAL VALVE                TRICUSPID VALVE MV Area (PHT): 4.54 cm     TR Peak grad:   26.2 mmHg MV Area VTI:   4.22 cm     TR Vmax:        256.00 cm/s MV Peak grad:  16.2 mmHg MV Mean grad:  6.0 mmHg     SHUNTS MV Vmax:       2.01 m/s     Systemic VTI:  0.37 m MV Vmean:      108.0 cm/s   Systemic Diam: 2.10 cm MV Decel Time: 167 msec MV E velocity: 144.00 cm/s MV A velocity: 99.50 cm/s MV E/A ratio:  1.45 Nona Dell MD Electronically signed by Nona Dell MD Signature Date/Time: 04/10/2021/2:35:18 PM    Final    US Abdomen Limited RUQ (LIVER/GB)  Result Date: 04/10/2021 CLINICAL DATA:  Increased  bilirubin for 2 days EXAM: ULTRASOUND ABDOMEN LIMITED RIGHT UPPER QUADRANT COMPARISON:  Abdominal ultrasound 01/12/2020 FINDINGS: Gallbladder: There is layering sludge in the gallbladder. There is mild gallbladder wall thickening and small amount of pericholecystic fluid. No shadowing stones are seen. Sonographic Eulah Pont sign was reported negative Common bile duct: Diameter: 4 mm.  There is no intrahepatic biliary ductal dilatation. Liver: Parenchymal echogenicity is diffusely increased throughout the liver. No focal lesions are seen. Portal vein is patent on color Doppler imaging with normal direction of blood flow towards the liver. Other: None. IMPRESSION: 1. Layering sludge in the gallbladder with mild gallbladder wall thickening and small amount of pericholecystic fluid. Sonographic Eulah Pont sign was reported negative. If there is clinical concern for acute cholecystitis, HIDA scan may be obtained. 2. No intra or extrahepatic biliary ductal dilatation. 3. Severe fatty infiltration of the liver. Electronically Signed   By: Lesia Hausen M.D.   On: 04/10/2021 09:07    Catarina Hartshorn, DO  [facility]  If 7PM-7AM, please contact night-coverage [email or web address] Password [email or web address] 04/13/2021, 2:06 PM   LOS: 4 days

## 2021-04-13 NOTE — Progress Notes (Signed)
Subjective:  Resting comfortably. Denies abdominal pain. No N/V. States he rested well. Spoke with nursing, Vincenza Hews. Precedex currently held.   Objective: Vital signs in last 24 hours: Temp:  [97.1 F (36.2 C)-98 F (36.7 C)] 97.8 F (36.6 C) (11/18 0755) Pulse Rate:  [25-86] 46 (11/18 0645) Resp:  [15-22] 17 (11/18 0645) BP: (140-178)/(79-134) 152/96 (11/18 0645) SpO2:  [98 %-100 %] 100 % (11/18 0645) Last BM Date: 04/12/21 General:   resting but easily arousable. Alert, cooperative in NAD Head:  Normocephalic and atraumatic. Eyes:   + icterus.  Abdomen:  Soft, nontender and nondistended.  Normal bowel sounds, without guarding, and without rebound.   Extremities:  Without clubbing, deformity or edema. Neurologic:  Alert and  oriented x4;  grossly normal neurologically. Skin:  Intact without significant lesions or rashes.+jaundice Psych:  Alert and cooperative. Normal mood and affect.  Intake/Output from previous day: 11/17 0701 - 11/18 0700 In: 2205.2 [P.O.:360; I.V.:1836; IV Piggyback:9.2] Out: -  Intake/Output this shift: Total I/O In: 217.8 [I.V.:217.8] Out: -   Lab Results: CBC Recent Labs    04/11/21 0420 04/12/21 0427 04/13/21 0418  WBC 3.9* 3.6* 5.2  HGB 10.4* 10.5* 10.5*  HCT 31.0* 31.2* 31.9*  MCV 104.4* 107.6* 110.0*  PLT 75* 81* 88*   BMET Recent Labs    04/11/21 0420 04/12/21 0427 04/13/21 0418  NA 133* 130* 132*  K 3.8 4.2 4.0  CL 102 103 106  CO2 22 17* 19*  GLUCOSE 85 185* 124*  BUN 6 8 8   CREATININE <0.30* 0.32* <0.30*  CALCIUM 8.0* 8.2* 8.1*   LFTs Recent Labs    04/11/21 0420 04/12/21 0427 04/13/21 0418  BILITOT 15.0* 15.6* 13.7*  ALKPHOS 150* 130* 129*  AST 615* 431* 289*  ALT 225* 201* 188*  PROT 6.0* 6.1* 6.2*  ALBUMIN 2.7* 2.7* 2.7*   No results for input(s): LIPASE in the last 72 hours. PT/INR Recent Labs    04/11/21 0420 04/12/21 0427  LABPROT 17.6* 17.8*  INR 1.5* 1.5*      Imaging Studies: DG Chest Portable 1  View  Result Date: 04/09/2021 CLINICAL DATA:  Shortness of breath, abdominal pain EXAM: PORTABLE CHEST 1 VIEW COMPARISON:  01/16/2020 FINDINGS: Heart and mediastinal contours are within normal limits. No focal opacities or effusions. No acute bony abnormality. IMPRESSION: No active disease. Electronically Signed   By: 01/18/2020 M.D.   On: 04/09/2021 20:36   ECHOCARDIOGRAM COMPLETE  Result Date: 04/10/2021    ECHOCARDIOGRAM REPORT   Patient Name:   DUARD SPIEWAK [place] Date of Exam: 04/10/2021 Medical Rec #:  04/12/2021        Height:       69.0 in Accession #:    [id number]       Weight:       218.0 lb Date of Birth:  1990/12/27        BSA:          2.143 m Patient Age:    30 years         BP:           140/84 mmHg Patient Gender: M                HR:           85 bpm. Exam Location:  02/24/1990 Procedure: 2D Echo, Cardiac Doppler and Color Doppler Indications:    Cardiomyopathy  History:        Patient has prior history of Echocardiogram  examinations, most                 recent 01/13/2020. CHF, Arrythmias:Atrial Fibrillation;                 Signs/Symptoms:Chest Pain. ETOH ABUSE.  Sonographer:    Wenda Low Referring Phys: [place]  1. Left ventricular ejection fraction, by estimation, is 70 to 75%. The left ventricle has hyperdynamic function. The left ventricle has no regional wall motion abnormalities. There is moderate asymmetric left ventricular hypertrophy of the basal septal  segment. Left ventricular diastolic parameters were normal. Elevated left atrial pressure. Mitral chordal SAM is noted with increased LVOT gradient of 60-100 mmHg. This is consistent with outflow obstruction and more of a hypertrophic cardiomyopathy picture than the prior study.  2. Right ventricular systolic function is normal. The right ventricular size is normal. There is normal pulmonary artery systolic pressure. The estimated right ventricular systolic pressure is [id number] mmHg.  3. Left atrial  size was moderately dilated.  4. The mitral valve is abnormal. Moderate mitral valve regurgitation.  5. The aortic valve is tricuspid. Aortic valve regurgitation is not visualized.  6. The inferior vena cava is normal in size with greater than 50% respiratory variability, suggesting right atrial pressure of 3 mmHg. Comparison(s): Prior images reviewed side by side. 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 FINDINGS  Left Ventricle: Left ventricular ejection fraction, by estimation, is 70 to 75%. The left ventricle has hyperdynamic function. The left ventricle has no regional wall motion abnormalities. The left ventricular internal cavity size was normal in size. There is moderate asymmetric left ventricular hypertrophy of the septal and basal segments. Left ventricular diastolic parameters were normal. Elevated left atrial pressure. Right Ventricle: The right ventricular size is normal. No increase in right ventricular wall thickness. Right ventricular systolic function is normal. There is normal pulmonary artery systolic pressure. The tricuspid regurgitant velocity is 2.56 m/s, and  with an assumed right atrial pressure of 3 mmHg, the estimated right ventricular systolic pressure is [id number] mmHg. Left Atrium: Left atrial size was moderately dilated. Right Atrium: Right atrial size was normal in size. Pericardium: There is no evidence of pericardial effusion. Mitral Valve: The mitral valve is abnormal. There is mild thickening of the mitral valve leaflet(s). Moderate mitral valve regurgitation, with eccentric posteriorly directed jet. MV peak gradient, 16.2 mmHg. The mean mitral valve gradient is 6.0 mmHg. Tricuspid Valve: The tricuspid valve is grossly normal. Tricuspid valve regurgitation is trivial. Aortic Valve: The aortic valve is tricuspid. There is mild aortic valve annular calcification. Aortic valve regurgitation is not visualized. Aortic  valve mean gradient measures 12.5 mmHg. Aortic valve peak gradient measures 27.1 mmHg. Aortic valve area, by VTI measures 2.62 cm. Pulmonic Valve: The pulmonic valve was grossly normal. Pulmonic valve regurgitation is trivial. Aorta: The aortic root is normal in size and structure. Venous: The inferior vena cava is normal in size with greater than 50% respiratory variability, suggesting right atrial pressure of 3 mmHg. IAS/Shunts: No atrial level shunt detected by color flow Doppler.  LEFT VENTRICLE PLAX 2D LVIDd:         5.40 cm      Diastology LVIDs:         2.60 cm      LV e' medial:    8.81 cm/s LV PW:         1.15 cm      LV E/e'  medial:  16.3 LV IVS:        1.20 cm      LV e' lateral:   11.30 cm/s LVOT diam:     2.10 cm      LV E/e' lateral: 12.7 LV SV:         128 LV SV Index:   60 LVOT Area:     3.46 cm  LV Volumes (MOD) LV vol d, MOD A2C: 92.2 ml LV vol d, MOD A4C: 106.0 ml LV vol s, MOD A2C: 33.3 ml LV vol s, MOD A4C: 47.9 ml LV SV MOD A2C:     58.9 ml LV SV MOD A4C:     106.0 ml LV SV MOD BP:      60.1 ml RIGHT VENTRICLE RV Basal diam:  3.10 cm RV Mid diam:    2.90 cm LEFT ATRIUM              Index        RIGHT ATRIUM           Index LA diam:        4.85 cm  2.26 cm/m   RA Area:     16.20 cm LA Vol (A2C):   110.0 ml 51.33 ml/m  RA Volume:   36.20 ml  16.89 ml/m LA Vol (A4C):   108.0 ml 50.39 ml/m LA Biplane Vol: 109.0 ml 50.86 ml/m  AORTIC VALVE                     PULMONIC VALVE AV Area (Vmax):    3.00 cm      PV Vmax:       1.12 m/s AV Area (Vmean):   3.30 cm      PV Peak grad:  5.0 mmHg AV Area (VTI):     2.62 cm AV Vmax:           260.50 cm/s AV Vmean:          163.500 cm/s AV VTI:            0.491 m AV Peak Grad:      27.1 mmHg AV Mean Grad:      12.5 mmHg LVOT Vmax:         225.50 cm/s LVOT Vmean:        156.000 cm/s LVOT VTI:          0.371 m LVOT/AV VTI ratio: 0.76 MITRAL VALVE                TRICUSPID VALVE MV Area (PHT): 4.54 cm     TR Peak grad:   26.2 mmHg MV Area VTI:   4.22 cm      TR Vmax:        256.00 cm/s MV Peak grad:  16.2 mmHg MV Mean grad:  6.0 mmHg     SHUNTS MV Vmax:       2.01 m/s     Systemic VTI:  0.37 m MV Vmean:      108.0 cm/s   Systemic Diam: 2.10 cm MV Decel Time: 167 msec MV E velocity: 144.00 cm/s MV A velocity: 99.50 cm/s MV E/A ratio:  1.45 Rozann Lesches MD Electronically signed by Rozann Lesches MD Signature Date/Time: 04/10/2021/2:35:18 PM    Final    US Abdomen Limited RUQ (LIVER/GB)  Result Date: 04/10/2021 CLINICAL DATA:  Increased bilirubin for 2 days EXAM: ULTRASOUND ABDOMEN LIMITED RIGHT UPPER QUADRANT COMPARISON:  Abdominal ultrasound 01/12/2020 FINDINGS: Gallbladder: There  is layering sludge in the gallbladder. There is mild gallbladder wall thickening and small amount of pericholecystic fluid. No shadowing stones are seen. Sonographic Percell Miller sign was reported negative Common bile duct: Diameter: 4 mm.  There is no intrahepatic biliary ductal dilatation. Liver: Parenchymal echogenicity is diffusely increased throughout the liver. No focal lesions are seen. Portal vein is patent on color Doppler imaging with normal direction of blood flow towards the liver. Other: None. IMPRESSION: 1. Layering sludge in the gallbladder with mild gallbladder wall thickening and small amount of pericholecystic fluid. Sonographic Percell Miller sign was reported negative. If there is clinical concern for acute cholecystitis, HIDA scan may be obtained. 2. No intra or extrahepatic biliary ductal dilatation. 3. Severe fatty infiltration of the liver. Electronically Signed   By: Valetta Mole M.D.   On: 04/10/2021 09:07  [2 weeks]   Assessment: 30 year old male with hx of significant alcohol abuse, elevated LFTs, depression/anxiety, A fib on Eliquis and digoxin, HTN, who presented to the ED yesterday for further evaluation of jaundice, with markedly elevated LFTs, Hyperbilirubinemia, GI consulted for further eval.  Acute on chronic elevated LFTs/hyperbilirubinemia: Suspected  etoh hepatitis in setting of chronic alcohol abuse (1/4 gallon of vodka per day). AST significantly elevated on admission but seems to be improving. Blood alcohol level 491. UDS + opiates. Tylenol level normal. Last meth 01/2021. Viral hepatitis panel negative. U/S with severe fatty infiltration of the liver, w/o evidence of cirrhosis, gallbladder sludge with mild wall thickening and small amt of pericholecystic fluid, likely reactive. Was started on lactulose due to elevated ammonia level. No signs of HE. Developed hallucinations/DTs yesterday afternoon and has been on precedex overnight. DF on admission 30.9-->34.8, prednisolone started. WBC/platelets low, likely due to etoh abuse. His liver numbers are improving. No clinical signs of liver failure.    Tbili: 14.3-->12.8-->15-->15.6-->13.7 AP: 180-->175-->150-->130-->129 AST: 922-->841-->615-->431-->289 ALT: 307-->266-->225-->201-->201 INR:1.4-->1.4-->1.5-->1.5-->   GERD/Nausea/ epigastric pain/dark stools: Poorly controlled GERD as outpatient, chronic symptoms since age 41. Per patient epigastric pain and UGI symptoms worse with etoh consumption. Denies NSAIDs. Has not been on Eliquis for Afib for over 2 months. PPI BID started. Hgb 12.6 on admission, down to 10.5 today.  B12 elevated. Folate low at 4.3. Hemoccult pending, patient declined rectal exam. He has also declined EGD.   Folate deficiency: folate 4.3. Folic acid 1mg  daily started.   Plan: Continue PPI twice daily, folic acid, lactulose. Continue prednisolone, check response at 1 week, if continues to improve then continue for complete treatment (40 mg daily for 28 days, followed by 2-4 week taper). Alcohol cessation, patient will need behavioral management support.  Declines inpatient rehab at least until after the holidays, stating he wants to be around for his daughter.   Laureen Ochs. Bernarda Caffey [facility] [phone] 11/18/20228:39 AM    LOS: 4 days

## 2021-04-13 NOTE — TOC Progression Note (Signed)
Transition of Care [facility]) - Progression Note    Patient Details  Name: Omar Mccarthy MRN: [id number] Date of Birth: 08/01/1990  [facility] of [facility]) CM/SW Contact  Leitha Bleak, RN Phone Number: 04/13/2021, 1:50 PM  Clinical Narrative:   Discussed out patient Psychiatric/recovery services with patient mother. Provided Day Loraine Leriche address and phone number. Added to AVS. She will call to schedule an appointment.     Expected Discharge Plan: Home/Self Care Barriers to Discharge: Continued Medical Work up  Expected Discharge Plan and Services Expected Discharge Plan: Home/Self Care In-house Referral: Clinical Social Work Discharge Planning Services: CM Consult   Living arrangements for the past 2 months: Single Family Home                   Readmission Risk Interventions Readmission Risk Prevention Plan 04/12/2021  Transportation Screening Complete  Home Care Screening Complete  Medication Review (RN CM) Complete  Some recent data might be hidden

## 2021-04-14 DIAGNOSIS — F10931 Alcohol use, unspecified with withdrawal delirium: Secondary | ICD-10-CM

## 2021-04-14 DIAGNOSIS — K7010 Alcoholic hepatitis without ascites: Secondary | ICD-10-CM

## 2021-04-14 LAB — COMPREHENSIVE METABOLIC PANEL
ALT: 186 U/L — ABNORMAL HIGH (ref 0–44)
AST: 267 U/L — ABNORMAL HIGH (ref 15–41)
Albumin: 2.6 g/dL — ABNORMAL LOW (ref 3.5–5.0)
Alkaline Phosphatase: 142 U/L — ABNORMAL HIGH (ref 38–126)
Anion gap: 8 (ref 5–15)
BUN: 9 mg/dL (ref 6–20)
CO2: 21 mmol/L — ABNORMAL LOW (ref 22–32)
Calcium: 8.4 mg/dL — ABNORMAL LOW (ref 8.9–10.3)
Chloride: 105 mmol/L (ref 98–111)
Creatinine, Ser: 0.39 mg/dL — ABNORMAL LOW (ref 0.61–1.24)
GFR, Estimated: >60 mL/min (ref >60)
Glucose, Bld: 100 mg/dL — ABNORMAL HIGH (ref 70–99)
Potassium: 4.2 mmol/L (ref 3.5–5.1)
Sodium: 134 mmol/L — ABNORMAL LOW (ref 135–145)
Total Bilirubin: 13.3 mg/dL — ABNORMAL HIGH (ref 0.3–1.2)
Total Protein: 6.1 g/dL — ABNORMAL LOW (ref 6.5–8.1)

## 2021-04-14 LAB — MAGNESIUM: Magnesium: 1.9 mg/dL (ref 1.7–2.4)

## 2021-04-14 LAB — PROTIME-INR
INR: 1.3 — ABNORMAL HIGH (ref 0.8–1.2)
Prothrombin Time: 15.8 seconds — ABNORMAL HIGH (ref 11.4–15.2)

## 2021-04-14 LAB — TSH: TSH: 3.031 u[IU]/mL (ref 0.350–4.500)

## 2021-04-14 LAB — T4, FREE: Free T4: 1.09 ng/dL (ref 0.61–1.12)

## 2021-04-14 MED ORDER — THIAMINE HCL 100 MG PO TABS: 100.0000 mg | ORAL_TABLET | Freq: Every day | ORAL | 1 refills | Status: DC

## 2021-04-14 MED ORDER — ADULT MULTIVITAMIN W/MINERALS CH: 1.0000 | ORAL_TABLET | Freq: Every day | ORAL | 2 refills | Status: DC

## 2021-04-14 MED ORDER — PREDNISOLONE 5 MG PO TABS: ORAL_TABLET | ORAL | 0 refills | Status: DC

## 2021-04-14 MED ORDER — PANTOPRAZOLE SODIUM 40 MG PO TBEC
40.0000 mg | DELAYED_RELEASE_TABLET | Freq: Two times a day (BID) | ORAL | 0 refills | Status: DC
Start: 2021-04-14 — End: 2021-10-25

## 2021-04-14 MED ORDER — PREDNISONE 10 MG PO TABS: ORAL_TABLET | ORAL | 0 refills | Status: DC

## 2021-04-14 MED ORDER — METOPROLOL SUCCINATE ER 50 MG PO TB24
50.0000 mg | ORAL_TABLET | ORAL | Status: AC
  Administered 2021-04-14: 50 mg via ORAL
  Filled 2021-04-14: qty 1

## 2021-04-14 MED ORDER — PANTOPRAZOLE SODIUM 40 MG PO TBEC
40.0000 mg | DELAYED_RELEASE_TABLET | Freq: Two times a day (BID) | ORAL | 0 refills | Status: DC
Start: 2021-04-14 — End: 2021-04-14

## 2021-04-14 MED ORDER — METOPROLOL SUCCINATE ER 50 MG PO TB24: 100.0000 mg | ORAL_TABLET | Freq: Every day | ORAL | Status: DC

## 2021-04-14 MED ORDER — LACTULOSE 10 GM/15ML PO SOLN: 20.0000 g | Freq: Every day | ORAL | 0 refills | Status: DC

## 2021-04-14 MED ORDER — LORAZEPAM 1 MG PO TABS: 1.0000 mg | ORAL_TABLET | Freq: Four times a day (QID) | ORAL | 0 refills | Status: DC | PRN

## 2021-04-14 MED ORDER — FOLIC ACID 1 MG PO TABS: 1.0000 mg | ORAL_TABLET | Freq: Every day | ORAL | 1 refills | Status: DC

## 2021-04-14 NOTE — Progress Notes (Signed)
Subjective:  Patient has no complaints.  He ate all of his breakfast.  His tray is empty.  He is open to go home today.  He says he was not able to sleep well because of the issue with his leads which would not stick.  Current Medications:  Current Facility-Administered Medications:    0.9 % NaCl with KCl 20 mEq/ L  infusion, , Intravenous, Continuous, Johnson, Clanford L, MD, Last Rate: 100 mL/hr at 04/13/21 1032, Infusion Verify at 04/13/21 1032   chlordiazePOXIDE (LIBRIUM) capsule 25 mg, 25 mg, Oral, TID, Johnson, Clanford L, MD, 25 mg at 04/14/21 1000   Chlorhexidine Gluconate Cloth 2 % PADS 6 each, 6 each, Topical, Daily, Johnson, Clanford L, MD, 6 each at 04/13/21 1107   dexmedetomidine (PRECEDEX) 400 MCG/100ML (4 mcg/mL) infusion, 0.4-1.2 mcg/kg/hr, Intravenous, Titrated, Tat, Shanon Brow, MD, Stopped at 04/13/21 0747   digoxin (LANOXIN) tablet 0.125 mg, 0.125 mg, Oral, Daily, Emokpae, Ejiroghene E, MD, 0.125 mg at 95/62/13 0865   folic acid (FOLVITE) tablet 1 mg, 1 mg, Oral, Daily, Emokpae, Ejiroghene E, MD, 1 mg at 04/14/21 1000   ibuprofen (ADVIL) tablet 600 mg, 600 mg, Oral, Q8H PRN, Wynetta Emery, Clanford L, MD, 600 mg at 04/13/21 1930   lactulose (CHRONULAC) 10 GM/15ML solution 20 g, 20 g, Oral, Daily, Harper, Kristen S, PA-C, 20 g at 04/14/21 1001   LORazepam (ATIVAN) injection 2 mg, 2 mg, Intravenous, Q4H PRN, Tat, David, MD, 2 mg at 04/13/21 2114   metoprolol succinate (TOPROL-XL) 24 hr tablet 50 mg, 50 mg, Oral, Daily, Johnson, Clanford L, MD, 50 mg at 04/14/21 1000   multivitamin with minerals tablet 1 tablet, 1 tablet, Oral, Daily, Emokpae, Ejiroghene E, MD, 1 tablet at 04/14/21 1000   nicotine (NICODERM CQ - dosed in mg/24 hours) patch 21 mg, 21 mg, Transdermal, Daily, Johnson, Clanford L, MD, 21 mg at 04/14/21 1001   ondansetron (ZOFRAN) tablet 4 mg, 4 mg, Oral, Q6H PRN, 4 mg at 04/12/21 0624 **OR** ondansetron (ZOFRAN) injection 4 mg, 4 mg, Intravenous, Q6H PRN, Emokpae, Ejiroghene E,  MD   pantoprazole (PROTONIX) injection 40 mg, 40 mg, Intravenous, Q12H, Harper, Kristen S, PA-C, 40 mg at 04/14/21 1001   polyethylene glycol (MIRALAX / GLYCOLAX) packet 17 g, 17 g, Oral, Daily PRN, Emokpae, Ejiroghene E, MD   prednisoLONE tablet 40 mg, 40 mg, Oral, Q breakfast, Carlan, Chelsea L, NP, 40 mg at 04/14/21 0754   thiamine tablet 100 mg, 100 mg, Oral, Daily, 100 mg at 04/14/21 1000 **OR** thiamine (B-1) injection 100 mg, 100 mg, Intravenous, Daily, Emokpae, Ejiroghene E, MD, 100 mg at 04/09/21 2103   Objective: Blood pressure 139/88, pulse 64, temperature 98.1 F (36.7 C), temperature source Oral, resp. rate 14, height 5' 9"  (1.753 m), weight 99.8 kg, SpO2 100 %. Patient is alert and oriented x3. He is tremulous but does not have asterixis. Sclera remains icteric. Cardiac exam with regular rhythm normal S1 and S2. No murmur or gallop noted. Lungs are clear to auscultation. Abdomen is symmetrical and soft.  Firm hepatomegaly.  Liver is not tender.  Spleen not palpable. No LE edema or clubbing noted.  Labs/studies Results:   CBC Latest Ref Rng & Units 04/13/2021 04/12/2021 04/11/2021  WBC 4.0 - 10.5 K/uL 5.2 3.6(L) 3.9(L)  Hemoglobin 13.0 - 17.0 g/dL 10.5(L) 10.5(L) 10.4(L)  Hematocrit 39.0 - 52.0 % 31.9(L) 31.2(L) 31.0(L)  Platelets 150 - 400 K/uL 88(L) 81(L) 75(L)    CMP Latest Ref Rng & Units 04/14/2021 04/13/2021 04/12/2021  Glucose 70 - 99 mg/dL 100(H) 124(H) 185(H)  BUN 6 - 20 mg/dL 9 8 8   Creatinine 0.61 - 1.24 mg/dL 0.39(L) <0.30(L) 0.32(L)  Sodium 135 - 145 mmol/L 134(L) 132(L) 130(L)  Potassium 3.5 - 5.1 mmol/L 4.2 4.0 4.2  Chloride 98 - 111 mmol/L 105 106 103  CO2 22 - 32 mmol/L 21(L) 19(L) 17(L)  Calcium 8.9 - 10.3 mg/dL 8.4(L) 8.1(L) 8.2(L)  Total Protein 6.5 - 8.1 g/dL 6.1(L) 6.2(L) 6.1(L)  Total Bilirubin 0.3 - 1.2 mg/dL 13.3(H) 13.7(H) 15.6(H)  Alkaline Phos 38 - 126 U/L 142(H) 129(H) 130(H)  AST 15 - 41 U/L 267(H) 289(H) 431(H)  ALT 0 - 44 U/L 186(H)  188(H) 201(H)    Hepatic Function Latest Ref Rng & Units 04/14/2021 04/13/2021 04/12/2021  Total Protein 6.5 - 8.1 g/dL 6.1(L) 6.2(L) 6.1(L)  Albumin 3.5 - 5.0 g/dL 2.6(L) 2.7(L) 2.7(L)  AST 15 - 41 U/L 267(H) 289(H) 431(H)  ALT 0 - 44 U/L 186(H) 188(H) 201(H)  Alk Phosphatase 38 - 126 U/L 142(H) 129(H) 130(H)  Total Bilirubin 0.3 - 1.2 mg/dL 13.3(H) 13.7(H) 15.6(H)    INR 1.3.  It was 1.52 days ago.  Assessment:  #1.  Alcoholic hepatitis with high HDF.  Day 4 on prednisolone.  Hepatic function improving as evidenced by improvement in INR and cholestasis also improving.  Hepatitis discriminant function has decreased from 34.8 to 30.78.  #2.  Alcohol withdrawal.  Patient is tremulous but he is not hallucinating.  He is on lorazepam as needed.  He was on Precedex until yesterday morning.  #3.  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  Underlying cirrhosis is not ruled out.  He only had limited ultrasound on admission.  Next ultrasound would be a complete exam.  #4.  Anemia secondary to acute illness.  No evidence of GI bleed.  He also could have folate deficiency.  He is receiving multivitamin thiamine and folate.  Recommendations  Begin prednisolone taper on 04/18/2021.  He should drop dose by 5 mg every week thereafter. No acetaminophen.  Keep NSAID use to minimum. Continue multivitamin thiamine and folate. Will arrange for office visit when he is discharged.

## 2021-04-14 NOTE — Discharge Summary (Signed)
Physician Discharge Summary  Omar Mccarthy OIB:[id number] DOB: 27-Dec-1990 DOA: 04/09/2021  PCP: [facility]  Admit date: 04/09/2021 Discharge date: 04/14/2021  Admitted From: home Disposition:  home  Recommendations for Outpatient Follow-up:  Follow up with PCP in 1-2 weeks Please obtain BMP/CBC in one week Omar will follow up with gastroenterology for repeat LFTs He plans to follow with AA to address his alcoholism  Discharge Condition: Stable CODE STATUS: Full code Diet recommendation: Heart healthy  Brief/Interim Summary: 30 year old male with history of alcohol abuse, atrial fibrillation, previous respiratory failure requiring intubation in the past, presents to the emergency room with jaundice.  He had seen his cardiologist 3 days prior to admission was noted to be jaundiced.  Bilirubin noted to be elevated.  The Omar continued to drink alcohol, approximately half a gallon of vodka every day.  He also endorsed taking Vicodin on a daily basis which was not prescribed to him.  On arrival to the emergency room, he is noted to have bilirubin of 14.3, AST ALT elevated at 92 and 307.  He was admitted for further evaluation and GI consult.  Discharge Diagnoses:  Principal Problem:   Acute alcoholic hepatitis Active Problems:   Alcohol abuse with intoxication (HCC)   HFrEF (heart failure with reduced ejection fraction) (HCC)   Polysubstance abuse (HCC)   Adderall use disorder, mild (HCC)   Noncompliance with medication regimen   Hyperbilirubinemia   Elevated transaminase level   Paroxysmal atrial fibrillation (HCC)   Ethanol causing toxic effect (HCC)   Hyperglycemia   Hypoalbuminemia   Anemia, chronic disease   Hypomagnesemia   Thrombocytopenia (HCC)   Sinus tachycardia   Severe protein-calorie malnutrition (HCC)   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    Elevated INR   Hyperammonemia (HCC)   Elevated liver enzymes   Acute liver  failure   Chronic pain   Anxiety and depression   SEVERE Hepatic steatosis   DTs (delirium tremens) (HCC)  Acute alcoholic hepatitis with alcohol intoxication -Seen by GI -He was started on prednisolone therapy -Right upper quadrant ultrasound showed mild gallbladder wall thickening and a small amount of pericholecystic fluid, negative Murphy sign -Overall LFTs have began to improve, bilirubin is slowly trending down -Plan was to discharge home on prednisolone course -Unfortunately, it appears that prednisolone is not available at any local pharmacies -Discussed with Dr. Karilyn Cota who felt it was reasonable to discharge the Omar on prednisone taper  Alcohol withdrawal/delirium tremens -Was initially started on Ativan, but briefly required Precedex -Precedex was weaned off on the morning of 11/18 -Following day, mental status appears to remain at baseline, he was alert and oriented x4 -He still has mild tremors, but felt this is baseline -He will be discharged with a course of Ativan to be taken as needed to prevent any further withdrawals  Chronic HFrEF -Previous echocardiogram noted to be 40% from 8/21 -This was repeated on 11/15 and EF noted to be 70-75% with hypertrophic cardiomyopathy  Thrombocytopenia -Related to myelosuppression from alcohol -B12 was not low -Folate was low and is being replaced  PAF -Previous DCCV 01/2020 -Remains in sinus rhythm -Continue on metoprolol -Poor candidate for anticoagulation due to ongoing alcohol abuse and hepatitis as well as thrombocytopenia  Alcohol abuse -Plans to follow-up with AA for further alcohol rehab -Not interested in residential program at this time  Hypertrophic cardiomyopathy -Continue follow-up with cardiology  Hyperammonemia -Continue lactulose -Currently, he is alert and oriented x4    Discharge  Instructions  Discharge Instructions     Diet - low sodium heart healthy   Complete by: As directed    Increase  activity slowly   Complete by: As directed       Allergies as of 04/14/2021   No Known Allergies      Medication List     STOP taking these medications    acetaminophen 500 MG tablet Commonly known as: TYLENOL   apixaban 5 MG Tabs tablet Commonly known as: ELIQUIS       TAKE these medications    digoxin 0.125 MG tablet Commonly known as: LANOXIN Take 1 tablet (0.125 mg total) by mouth daily.   folic acid 1 MG tablet Commonly known as: FOLVITE Take 1 tablet (1 mg total) by mouth daily. Start taking on: April 15, 2021   furosemide 20 MG tablet Commonly known as: LASIX Take 1 tablet (20 mg total) by mouth as needed for edema.   lactulose 10 GM/15ML solution Commonly known as: CHRONULAC Take 30 mLs (20 g total) by mouth daily. Start taking on: April 15, 2021   LORazepam 1 MG tablet Commonly known as: Ativan Take 1 tablet (1 mg total) by mouth every 6 (six) hours as needed for anxiety (tremors).   metoprolol succinate 100 MG 24 hr tablet Commonly known as: TOPROL-XL TAKE ONE TABLET BY MOUTH DAILY WITH OR IMMEDIATELY FOLLOWING A MEAL   multivitamin with minerals Tabs tablet Take 1 tablet by mouth daily.   pantoprazole 40 MG tablet Commonly known as: PROTONIX Take 1 tablet (40 mg total) by mouth 2 (two) times daily before a meal. Pt needs to keep upcoming appt in Sept for further refills What changed: when to take this   predniSONE 10 MG tablet Commonly known as: DELTASONE Take  po daily until 04/18/21, then decrease dose by  every week until complete   thiamine 100 MG tablet Take 1 tablet (100 mg total) by mouth daily.        Follow-up Information     [facility]. Call.   Why: Call to schedule an appointment Contact information: [phone]  [address] [place], [place] [address]               No Known Allergies  Consultations: Gastroenterology   Procedures/Studies: DG Chest Portable 1  View  Result Date: 04/09/2021 CLINICAL DATA:  Shortness of breath, abdominal pain EXAM: PORTABLE CHEST 1 VIEW COMPARISON:  01/16/2020 FINDINGS: Heart and mediastinal contours are within normal limits. No focal opacities or effusions. No acute bony abnormality. IMPRESSION: No active disease. Electronically Signed   By: Charlett Nose M.D.   On: 04/09/2021 20:36   ECHOCARDIOGRAM COMPLETE  Result Date: 04/10/2021    ECHOCARDIOGRAM REPORT   Omar Name:   Omar Mccarthy Yon Date of Exam: 04/10/2021 Medical Rec #:  [id number]        Height:       69.0 in Accession #:    [id number]       Weight:       218.0 lb Date of Birth:  06-Nov-1990        BSA:          2.143 m Omar Age:    30 years         BP:           140/84 mmHg Omar Gender: M                HR:  85 bpm. [facility] Procedure: 2D Echo, Cardiac Doppler and Color Doppler Indications:    Cardiomyopathy  History:        Omar has prior history of Echocardiogram examinations, most                 recent 01/13/2020. CHF, Arrythmias:Atrial Fibrillation;                 Signs/Symptoms:Chest Pain. ETOH ABUSE.  Sonographer:    Mikki Harbor Referring Phys: [phone] CLANFORD L JOHNSON IMPRESSIONS  1. Left ventricular ejection fraction, by estimation, is 70 to 75%. The left ventricle has hyperdynamic function. The left ventricle has no regional wall motion abnormalities. There is moderate asymmetric left ventricular hypertrophy of the basal septal  segment. Left ventricular diastolic parameters were normal. Elevated left atrial pressure. Mitral chordal SAM is noted with increased LVOT gradient of 60-100 mmHg. This is consistent with outflow obstruction and more of a hypertrophic cardiomyopathy picture than the prior study.  2. Right ventricular systolic function is normal. The right ventricular size is normal. There is normal pulmonary artery systolic pressure. The estimated right ventricular systolic pressure is 29.2 mmHg.  3. Left atrial  size was moderately dilated.  4. The mitral valve is abnormal. Moderate mitral valve regurgitation.  5. The aortic valve is tricuspid. Aortic valve regurgitation is not visualized.  6. The inferior vena cava is normal in size with greater than 50% respiratory variability, suggesting right atrial pressure of 3 mmHg. Comparison(s): Prior images reviewed side by side. 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 FINDINGS  Left Ventricle: Left ventricular ejection fraction, by estimation, is 70 to 75%. The left ventricle has hyperdynamic function. The left ventricle has no regional wall motion abnormalities. The left ventricular internal cavity size was normal in size. There is moderate asymmetric left ventricular hypertrophy of the septal and basal segments. Left ventricular diastolic parameters were normal. Elevated left atrial pressure. Right Ventricle: The right ventricular size is normal. No increase in right ventricular wall thickness. Right ventricular systolic function is normal. There is normal pulmonary artery systolic pressure. The tricuspid regurgitant velocity is 2.56 m/s, and  with an assumed right atrial pressure of 3 mmHg, the estimated right ventricular systolic pressure is 29.2 mmHg. Left Atrium: Left atrial size was moderately dilated. Right Atrium: Right atrial size was normal in size. Pericardium: There is no evidence of pericardial effusion. Mitral Valve: The mitral valve is abnormal. There is mild thickening of the mitral valve leaflet(s). Moderate mitral valve regurgitation, with eccentric posteriorly directed jet. MV peak gradient, 16.2 mmHg. The mean mitral valve gradient is 6.0 mmHg. Tricuspid Valve: The tricuspid valve is grossly normal. Tricuspid valve regurgitation is trivial. Aortic Valve: The aortic valve is tricuspid. There is mild aortic valve annular calcification. Aortic valve regurgitation is not visualized. Aortic  valve mean gradient measures 12.5 mmHg. Aortic valve peak gradient measures 27.1 mmHg. Aortic valve area, by VTI measures 2.62 cm. Pulmonic Valve: The pulmonic valve was grossly normal. Pulmonic valve regurgitation is trivial. Aorta: The aortic root is normal in size and structure. Venous: The inferior vena cava is normal in size with greater than 50% respiratory variability, suggesting right atrial pressure of 3 mmHg. IAS/Shunts: No atrial level shunt detected by color flow Doppler.  LEFT VENTRICLE PLAX 2D LVIDd:         5.40 cm      Diastology LVIDs:  2.60 cm      LV e' medial:    8.81 cm/s LV PW:         1.15 cm      LV E/e' medial:  16.3 LV IVS:        1.20 cm      LV e' lateral:   11.30 cm/s LVOT diam:     2.10 cm      LV E/e' lateral: 12.7 LV SV:         128 LV SV Index:   60 LVOT Area:     3.46 cm  LV Volumes (MOD) LV vol d, MOD A2C: 92.2 ml LV vol d, MOD A4C: 106.0 ml LV vol s, MOD A2C: 33.3 ml LV vol s, MOD A4C: 47.9 ml LV SV MOD A2C:     58.9 ml LV SV MOD A4C:     106.0 ml LV SV MOD BP:      60.1 ml RIGHT VENTRICLE RV Basal diam:  3.10 cm RV Mid diam:    2.90 cm LEFT ATRIUM              Index        RIGHT ATRIUM           Index LA diam:        4.85 cm  2.26 cm/m   RA Area:     16.20 cm LA Vol (A2C):   110.0 ml 51.33 ml/m  RA Volume:   36.20 ml  16.89 ml/m LA Vol (A4C):   108.0 ml 50.39 ml/m LA Biplane Vol: 109.0 ml 50.86 ml/m  AORTIC VALVE                     PULMONIC VALVE AV Area (Vmax):    3.00 cm      PV Vmax:       1.12 m/s AV Area (Vmean):   3.30 cm      PV Peak grad:  5.0 mmHg AV Area (VTI):     2.62 cm AV Vmax:           260.50 cm/s AV Vmean:          163.500 cm/s AV VTI:            0.491 m AV Peak Grad:      27.1 mmHg AV Mean Grad:      12.5 mmHg LVOT Vmax:         225.50 cm/s LVOT Vmean:        156.000 cm/s LVOT VTI:          0.371 m LVOT/AV VTI ratio: 0.76 MITRAL VALVE                TRICUSPID VALVE MV Area (PHT): 4.54 cm     TR Peak grad:   26.2 mmHg MV Area VTI:   4.22 cm      TR Vmax:        256.00 cm/s MV Peak grad:  16.2 mmHg MV Mean grad:  6.0 mmHg     SHUNTS MV Vmax:       2.01 m/s     Systemic VTI:  0.37 m MV Vmean:      108.0 cm/s   Systemic Diam: 2.10 cm MV Decel Time: 167 msec MV E velocity: 144.00 cm/s MV A velocity: 99.50 cm/s MV E/A ratio:  1.45 Nona Dell MD Electronically signed by Nona Dell MD Signature Date/Time: 04/10/2021/2:35:18 PM    Final  US Abdomen Limited RUQ (LIVER/GB)  Result Date: 04/10/2021 CLINICAL DATA:  Increased bilirubin for 2 days EXAM: ULTRASOUND ABDOMEN LIMITED RIGHT UPPER QUADRANT COMPARISON:  Abdominal ultrasound 01/12/2020 FINDINGS: Gallbladder: There is layering sludge in the gallbladder. There is mild gallbladder wall thickening and small amount of pericholecystic fluid. No shadowing stones are seen. Sonographic Eulah Pont sign was reported negative Common bile duct: Diameter: 4 mm.  There is no intrahepatic biliary ductal dilatation. Liver: Parenchymal echogenicity is diffusely increased throughout the liver. No focal lesions are seen. Portal vein is patent on color Doppler imaging with normal direction of blood flow towards the liver. Other: None. IMPRESSION: 1. Layering sludge in the gallbladder with mild gallbladder wall thickening and small amount of pericholecystic fluid. Sonographic Eulah Pont sign was reported negative. If there is clinical concern for acute cholecystitis, HIDA scan may be obtained. 2. No intra or extrahepatic biliary ductal dilatation. 3. Severe fatty infiltration of the liver. Electronically Signed   By: Lesia Hausen M.D.   On: 04/10/2021 09:07      Subjective: He is feeling significantly better and is very insistent on being discharged home today.  He does have some mild tremors, but reports that this is his baseline and he continues to have tremors when he is sober.  Mental status appears to be intact.  Discharge Exam: Vitals:   04/14/21 1000 04/14/21 1020 04/14/21 1145 04/14/21 1200  BP: (!)  145/121 131/86  (!) 156/91  Pulse: 89     Resp:  (!) 21 (!) 23 (!) 26  Temp:   98.1 F (36.7 C)   TempSrc:   Oral   SpO2:      Weight:      Height:        General: Pt is alert, awake, not in acute distress Cardiovascular: RRR, S1/S2 +, no rubs, no gallops Respiratory: CTA bilaterally, no wheezing, no rhonchi Abdominal: Soft, NT, ND, bowel sounds + Extremities: no edema, no cyanosis    The results of significant diagnostics from this hospitalization (including imaging, microbiology, ancillary and laboratory) are listed below for reference.     Microbiology: Recent Results (from the past 240 hour(s))  Resp Panel by RT-PCR (Flu A&B, Covid) Nasopharyngeal Swab     Status: None   Collection Time: 04/09/21  8:29 PM   Specimen: Nasopharyngeal Swab; Nasopharyngeal(NP) swabs in vial transport medium  Result Value Ref Range Status   SARS Coronavirus 2 by RT PCR NEGATIVE NEGATIVE Final    Comment: (NOTE) SARS-CoV-2 target nucleic acids are NOT DETECTED.  The SARS-CoV-2 RNA is generally detectable in upper respiratory specimens during the acute phase of infection. The lowest concentration of SARS-CoV-2 viral copies this assay can detect is 138 copies/mL. A negative result does not preclude SARS-Cov-2 infection and should not be used as the sole basis for treatment or other Omar management decisions. A negative result may occur with  improper specimen collection/handling, submission of specimen other than nasopharyngeal swab, presence of viral mutation(s) within the areas targeted by this assay, and inadequate number of viral copies(<138 copies/mL). A negative result must be combined with clinical observations, Omar history, and epidemiological information. The expected result is Negative.  Fact Sheet for Patients:  BloggerCourse.com  Fact Sheet for Healthcare Providers:  SeriousBroker.it  This test is no t yet approved or  cleared by the [place] FDA and  has been authorized for detection and/or diagnosis of SARS-CoV-2 by FDA under an Emergency Use Authorization (EUA). This EUA will remain  in effect (meaning  this test can be used) for the duration of the COVID-19 declaration under Section 564(b)(1) of the Act, 21 U.S.C.section 360bbb-3(b)(1), unless the authorization is terminated  or revoked sooner.       Influenza A by PCR NEGATIVE NEGATIVE Final   Influenza B by PCR NEGATIVE NEGATIVE Final    Comment: (NOTE) The Xpert Xpress SARS-CoV-2/FLU/RSV plus assay is intended as an aid in the diagnosis of influenza from Nasopharyngeal swab specimens and should not be used as a sole basis for treatment. Nasal washings and aspirates are unacceptable for Xpert Xpress SARS-CoV-2/FLU/RSV testing.  Fact Sheet for Patients: BloggerCourse.com  Fact Sheet for Healthcare Providers: SeriousBroker.it  This test is not yet approved or cleared by the [place] FDA and has been authorized for detection and/or diagnosis of SARS-CoV-2 by FDA under an Emergency Use Authorization (EUA). This EUA will remain in effect (meaning this test can be used) for the duration of the COVID-19 declaration under Section 564(b)(1) of the Act, 21 U.S.C. section 360bbb-3(b)(1), unless the authorization is terminated or revoked.  Performed at [facility], [address], [place], [place] [address]   MRSA Next Gen by PCR, Nasal     Status: None   Collection Time: 04/10/21  4:21 PM   Specimen: Nasal Mucosa; Nasal Swab  Result Value Ref Range Status   MRSA by PCR Next Gen NOT DETECTED NOT DETECTED Final    Comment: (NOTE) The GeneXpert MRSA Assay (FDA approved for NASAL specimens only), is one component of a comprehensive MRSA colonization surveillance program. It is not intended to diagnose MRSA infection nor to guide or monitor treatment for MRSA infections. Test performance  is not FDA approved in patients less than 40 years old. Performed at [facility], [address], [place], [place] [address]      Labs: BNP (last 3 results) Recent Labs    04/09/21 2021  BNP 58.0   Basic Metabolic Panel: Recent Labs  Lab 04/09/21 2021 04/10/21 0426 04/11/21 0420 04/12/21 0427 04/13/21 0418 04/14/21 0500  NA 137 137 133* 130* 132* 134*  K 3.7 3.5 3.8 4.2 4.0 4.2  CL 98 100 102 103 106 105  CO2 23 25 22  17* 19* 21*  GLUCOSE 120* 120* 85 185* 124* 100*  BUN 7 7 6 8 8 9   CREATININE 0.32* 0.44* <0.30* 0.32* <0.30* 0.39*  CALCIUM 8.2* 7.9* 8.0* 8.2* 8.1* 8.4*  MG 1.6* 1.8 1.8 1.6*  --  1.9  PHOS 3.2  --   --   --   --   --    Liver Function Tests: Recent Labs  Lab 04/10/21 0426 04/11/21 0420 04/12/21 0427 04/13/21 0418 04/14/21 0500  AST 841* 615* 431* 289* 267*  ALT 266* 225* 201* 188* 186*  ALKPHOS 175* 150* 130* 129* 142*  BILITOT 12.8* 15.0* 15.6* 13.7* 13.3*  PROT 6.2* 6.0* 6.1* 6.2* 6.1*  ALBUMIN 2.8* 2.7* 2.7* 2.7* 2.6*   Recent Labs  Lab 04/09/21 2021  LIPASE 50   Recent Labs  Lab 04/09/21 2021 04/10/21 0819 04/11/21 0420 04/12/21 0427 04/13/21 0418  AMMONIA 57* 75* 56* 73* 92*   CBC: Recent Labs  Lab 04/09/21 2021 04/10/21 0426 04/11/21 0420 04/12/21 0427 04/13/21 0418  WBC 3.9* 4.7 3.9* 3.6* 5.2  NEUTROABS 2.4  --   --   --   --   HGB 12.6* 11.4* 10.4* 10.5* 10.5*  HCT 36.8* 33.0* 31.0* 31.2* 31.9*  MCV 103.7* 104.1* 104.4* 107.6* 110.0*  PLT 106* 95* 75* 81*  88*   Cardiac Enzymes: No results for input(s): CKTOTAL, CKMB, CKMBINDEX, TROPONINI in the last 168 hours. BNP: Invalid input(s): POCBNP CBG: No results for input(s): GLUCAP in the last 168 hours. D-Dimer No results for input(s): DDIMER in the last 72 hours. Hgb A1c No results for input(s): HGBA1C in the last 72 hours. Lipid Profile No results for input(s): CHOL, HDL, LDLCALC, TRIG, CHOLHDL, LDLDIRECT in the last 72 hours. Thyroid function  studies Recent Labs    04/14/21 0500  TSH 3.031   Anemia work up No results for input(s): VITAMINB12, FOLATE, FERRITIN, TIBC, IRON, RETICCTPCT in the last 72 hours. Urinalysis    Component Value Date/Time   COLORURINE AMBER (A) 04/10/2021 0003   APPEARANCEUR HAZY (A) 04/10/2021 0003   LABSPEC 1.017 04/10/2021 0003   PHURINE 6.0 04/10/2021 0003   GLUCOSEU NEGATIVE 04/10/2021 0003   HGBUR SMALL (A) 04/10/2021 0003   BILIRUBINUR MODERATE (A) 04/10/2021 0003   KETONESUR NEGATIVE 04/10/2021 0003   PROTEINUR 100 (A) 04/10/2021 0003   NITRITE NEGATIVE 04/10/2021 0003   LEUKOCYTESUR NEGATIVE 04/10/2021 0003   Sepsis Labs Invalid input(s): PROCALCITONIN,  WBC,  LACTICIDVEN Microbiology Recent Results (from the past 240 hour(s))  Resp Panel by RT-PCR (Flu A&B, Covid) Nasopharyngeal Swab     Status: None   Collection Time: 04/09/21  8:29 PM   Specimen: Nasopharyngeal Swab; Nasopharyngeal(NP) swabs in vial transport medium  Result Value Ref Range Status   SARS Coronavirus 2 by RT PCR NEGATIVE NEGATIVE Final    Comment: (NOTE) SARS-CoV-2 target nucleic acids are NOT DETECTED.  The SARS-CoV-2 RNA is generally detectable in upper respiratory specimens during the acute phase of infection. The lowest concentration of SARS-CoV-2 viral copies this assay can detect is 138 copies/mL. A negative result does not preclude SARS-Cov-2 infection and should not be used as the sole basis for treatment or other Omar management decisions. A negative result may occur with  improper specimen collection/handling, submission of specimen other than nasopharyngeal swab, presence of viral mutation(s) within the areas targeted by this assay, and inadequate number of viral copies(<138 copies/mL). A negative result must be combined with clinical observations, Omar history, and epidemiological information. The expected result is Negative.  Fact Sheet for Patients:   BloggerCourse.com  Fact Sheet for Healthcare Providers:  SeriousBroker.it  This test is no t yet approved or cleared by the [place] FDA and  has been authorized for detection and/or diagnosis of SARS-CoV-2 by FDA under an Emergency Use Authorization (EUA). This EUA will remain  in effect (meaning this test can be used) for the duration of the COVID-19 declaration under Section 564(b)(1) of the Act, 21 U.S.C.section 360bbb-3(b)(1), unless the authorization is terminated  or revoked sooner.       Influenza A by PCR NEGATIVE NEGATIVE Final   Influenza B by PCR NEGATIVE NEGATIVE Final    Comment: (NOTE) The Xpert Xpress SARS-CoV-2/FLU/RSV plus assay is intended as an aid in the diagnosis of influenza from Nasopharyngeal swab specimens and should not be used as a sole basis for treatment. Nasal washings and aspirates are unacceptable for Xpert Xpress SARS-CoV-2/FLU/RSV testing.  Fact Sheet for Patients: BloggerCourse.com  Fact Sheet for Healthcare Providers: SeriousBroker.it  This test is not yet approved or cleared by the [place] FDA and has been authorized for detection and/or diagnosis of SARS-CoV-2 by FDA under an Emergency Use Authorization (EUA). This EUA will remain in effect (meaning this test can be used) for the duration of the COVID-19 declaration under Section  564(b)(1) of the Act, 21 U.S.C. section 360bbb-3(b)(1), unless the authorization is terminated or revoked.  Performed at [facility], [address], [place], [place] [address]   MRSA Next Gen by PCR, Nasal     Status: None   Collection Time: 04/10/21  4:21 PM   Specimen: Nasal Mucosa; Nasal Swab  Result Value Ref Range Status   MRSA by PCR Next Gen NOT DETECTED NOT DETECTED Final    Comment: (NOTE) The GeneXpert MRSA Assay (FDA approved for NASAL specimens only), is one component of a  comprehensive MRSA colonization surveillance program. It is not intended to diagnose MRSA infection nor to guide or monitor treatment for MRSA infections. Test performance is not FDA approved in patients less than 86 years old. Performed at [facility], [address] [place], [place] [address]      Time coordinating discharge:  SIGNED:   Erick Blinks, MD  [facility] 04/14/2021, 8:54 PM   If 7PM-7AM, please contact night-coverage [email or web address]

## 2021-04-14 NOTE — Progress Notes (Signed)
Requested to walk. Walked around the unit approximately 150 feet without oxygen. Patient had no difficulty walking, oxygen saturations stayed 99-100% and heart rate only increased from 88 beats per minute to 105 and returned back to 90 beats per minute when he sat down in room. Never needed oxygen for walk. Did not sway or have faltered steps.

## 2021-04-23 ENCOUNTER — Telehealth: Payer: Self-pay | Admitting: Cardiology

## 2021-04-23 NOTE — Telephone Encounter (Signed)
Pt c/o medication issue:  1. Name of Medication: lactulose (CHRONULAC) 10 GM/15ML solution  2. How are you currently taking this medication (dosage and times per day)? 30 ml  3. Are you having a reaction (difficulty breathing--STAT)? no  4. What is your medication issue? Patient states he was given the medication in the hospital 11/14 and is now out. He would like to know if he needs to continue the medication or not.

## 2021-04-23 NOTE — Telephone Encounter (Signed)
Contacted pt to let pt know that he should seek this prescription refill from PCP. Pt stated that he does not have PCP at the moment. Pt is scheduled to see GI on 05/22/2021. Pt also stated that he was told not to take any NSAIDS or OTC pain medication. Pt stated that he is in pain d/t his back and asked if there was anything he could take.

## 2021-04-24 NOTE — Telephone Encounter (Signed)
Left a message for pt to call office back.  °

## 2021-04-24 NOTE — Telephone Encounter (Signed)
Refill would need to come from GI, would also need to discuss with them any pain meds he could possibly take given his liver issues  Dominga Ferry MD

## 2021-04-30 ENCOUNTER — Other Ambulatory Visit: Payer: Self-pay | Admitting: Cardiology

## 2021-04-30 NOTE — Telephone Encounter (Signed)
Pt verbalized understanding and will contact GI for refill and pain medication.

## 2021-05-15 ENCOUNTER — Other Ambulatory Visit (HOSPITAL_COMMUNITY)
Admission: RE | Admit: 2021-05-15 | Discharge: 2021-05-15 | Disposition: A | Discharge status: Home / Self Care | Payer: Medicaid Other | Source: Ambulatory Visit | Attending: Gastroenterology | Admitting: Gastroenterology

## 2021-05-15 ENCOUNTER — Other Ambulatory Visit: Payer: Self-pay

## 2021-05-15 DIAGNOSIS — R7989 Other specified abnormal findings of blood chemistry: Secondary | ICD-10-CM | POA: Insufficient documentation

## 2021-05-15 DIAGNOSIS — K7010 Alcoholic hepatitis without ascites: Secondary | ICD-10-CM | POA: Insufficient documentation

## 2021-05-15 LAB — CBC
HCT: 36.3 % — ABNORMAL LOW (ref 39.0–52.0)
Hemoglobin: 12.1 g/dL — ABNORMAL LOW (ref 13.0–17.0)
MCH: 33.5 pg (ref 26.0–34.0)
MCHC: 33.3 g/dL (ref 30.0–36.0)
MCV: 100.6 fL — ABNORMAL HIGH (ref 80.0–100.0)
Platelets: 191 10*3/uL (ref 150–400)
RBC: 3.61 MIL/uL — ABNORMAL LOW (ref 4.22–5.81)
RDW: 12.2 % (ref 11.5–15.5)
WBC: 11.8 10*3/uL — ABNORMAL HIGH (ref 4.0–10.5)
nRBC: 0 % (ref 0.0–0.2)

## 2021-05-15 LAB — COMPREHENSIVE METABOLIC PANEL
ALT: 82 U/L — ABNORMAL HIGH (ref 0–44)
AST: 71 U/L — ABNORMAL HIGH (ref 15–41)
Albumin: 3.4 g/dL — ABNORMAL LOW (ref 3.5–5.0)
Alkaline Phosphatase: 87 U/L (ref 38–126)
Anion gap: 7 (ref 5–15)
BUN: 13 mg/dL (ref 6–20)
CO2: 25 mmol/L (ref 22–32)
Calcium: 8.5 mg/dL — ABNORMAL LOW (ref 8.9–10.3)
Chloride: 104 mmol/L (ref 98–111)
Creatinine, Ser: 0.62 mg/dL (ref 0.61–1.24)
GFR, Estimated: >60 mL/min (ref >60)
Glucose, Bld: 101 mg/dL — ABNORMAL HIGH (ref 70–99)
Potassium: 3.5 mmol/L (ref 3.5–5.1)
Sodium: 136 mmol/L (ref 135–145)
Total Bilirubin: 2.7 mg/dL — ABNORMAL HIGH (ref 0.3–1.2)
Total Protein: 7 g/dL (ref 6.5–8.1)

## 2021-05-15 LAB — PROTIME-INR
INR: 1.1 (ref 0.8–1.2)
Prothrombin Time: 14.1 seconds (ref 11.4–15.2)

## 2021-05-22 ENCOUNTER — Encounter (INDEPENDENT_AMBULATORY_CARE_PROVIDER_SITE_OTHER): Payer: Self-pay | Admitting: Gastroenterology

## 2021-05-22 ENCOUNTER — Ambulatory Visit (INDEPENDENT_AMBULATORY_CARE_PROVIDER_SITE_OTHER): Payer: Self-pay | Admitting: Gastroenterology

## 2021-05-22 ENCOUNTER — Other Ambulatory Visit: Payer: Self-pay

## 2021-05-22 VITALS — BP 140/94 | HR 78 | Temp 97.6°F | Ht 69.0 in | Wt 227.6 lb

## 2021-05-22 DIAGNOSIS — F1011 Alcohol abuse, in remission: Secondary | ICD-10-CM | POA: Insufficient documentation

## 2021-05-22 DIAGNOSIS — K7010 Alcoholic hepatitis without ascites: Secondary | ICD-10-CM

## 2021-05-22 NOTE — Progress Notes (Signed)
Referring Provider: [facility], [facility]* Primary Care Physician:  Health, [facility] Primary GI Physician: newly established  Chief Complaint  Patient presents with   Hospitalization Follow-up    Patient here today here today for a hospital follow up from 04/09/2021, due to hyperbilirubinemia. Patient denies any current GI issues.   HPI:   Omar Mccarthy is a 30 y.o. male with past medical history of alcohol abuse, atrial fibrillation, HTN, depression, anxiety, previous respiratory failure requiring intubation, GERD.  Patient presenting today for hospital follow up after admission for alcoholic hepatitis in November.   Patient presented to ED on 04/09/21 after his cardiologist 3 days prior noticed that patient was jaundiced, blood work showed acute liver failure, he was recommended to come to ED for further evaluation, however, due to personal matters he was delayed in presenting.   He had markedly elevated LFTs with AST 922, ALT 307, ALP 180, T bili 14.3, INR 1.4, ammonia 57, blood alcohol 491 on admission. UDS +opiates. Tylenol <10, DF >32, patient was started on prednisolone 63m daily on 04/11/21. He denied any episodes of confusion at that time, he was on chronic eliquis r/t hx of a fib and reported some easy bruising and nosebleeds prior to admission, therefore he had stopped his eliquis. He also reported ongoing nausea and vomiting since age 30 usually triggered by etoh or lack there of. He also endorsed dark green/black stools since July, occurring several times per week, denied iron or pepto bismol intake, no BRB per rectum. Patient declined rectal exam and EGD during admission. Liver [place] with severe fatty infiltration without evidence of cirrhosis but gallbladder was with sludge with mild wall thickening and small amount of pericholecystic fluid, likely reactive. He did develop some hallucinations/DTs on 11/17 and was started on short course of  precedex.   Patient was counseled multiple times on imperativeness of alcohol cessation. Advised to continue PPI BID, folic acid daily and lactulose. Prednisolone 30 mg daily, starting with 572mdecrease in dose each week, thereafter.   Labs on 12/20 were much improved with AST 71, ALT 82, Alk Phos 87, T bili 2.7, INR 1.1, hgb also improved to 12.1.  DF 6.4 on 05/15/21.  Last dose of prednisone was yesterday. He has not drank since prior to admission, reports that it has been easier this time to not drink than it was previously. He is not currently undergoing any therapy/counseling or group meetings.  He reports that his weight has been good since he left the hospital, appetite has also been good.   Reports acid reflux has been better, states that he has not had his protonix in the past 2 weeks due to money but is planning to get it refilled as soon as he can. He is experiencing some reflux maybe every other day, previously it was daily. He has not had any episodes of nausea or vomiting. No constipation or diarrhea. He reports that his energy has been up and down, though he still experiences some fatigue some day but has improved well since leaving the hospital, though he feels this may be heavily influence by his anxiety. He is not on anything for his anxiety, finished up short course of ativan that he was discharged with and attempted to get this refilled but had no refills available. He denies any episodes of confusion, swelling to his abdomen or legs.  He has not restarted his eliquis for A fib.  NSAID usCHE:NIDPocial hx:hx of heavy  alcohol use no alcohol since mid november Fam hx: no personal family hx of autoimmune disease, CRC mother had several cousins with alcoholic cirrhosis  Last Colonoscopy: never Last Endoscopy: never  Past Medical History:  Diagnosis Date   Atrial fibrillation [facility])    a. diagnosed in 12/2019   Hypertension    Past Surgical History:  Procedure Laterality Date    CARDIOVERSION N/A 02/24/2020   Procedure: CARDIOVERSION;  Surgeon: Satira Sark, MD;  [facility];  Service: Cardiovascular;  Laterality: N/A;   Current Outpatient Medications  Medication Sig Dispense Refill   digoxin (LANOXIN) 0.125 MG tablet TAKE 1 TABLET BY MOUTH DAILY 30 tablet 5   folic acid (FOLVITE) 1 MG tablet Take 1 tablet (1 mg total) by mouth daily. 30 tablet 1   furosemide (LASIX) 20 MG tablet Take 1 tablet (20 mg total) by mouth as needed for edema. (Patient not taking: No sig reported) 30 tablet 6   lactulose (CHRONULAC) 10 GM/15ML solution Take 30 mLs (20 g total) by mouth daily. 236 mL 0   LORazepam (ATIVAN) 1 MG tablet Take 1 tablet (1 mg total) by mouth every 6 (six) hours as needed for anxiety (tremors). 30 tablet 0   metoprolol succinate (TOPROL-XL) 100 MG 24 hr tablet TAKE 1 TABLET(100 MG) BY MOUTH DAILY WITH OR IMMEDIATELY FOLLOWING A MEAL 30 tablet 5   Multiple Vitamin (MULTIVITAMIN WITH MINERALS) TABS tablet Take 1 tablet by mouth daily. 30 tablet 2   pantoprazole (PROTONIX) 40 MG tablet Take 1 tablet (40 mg total) by mouth 2 (two) times daily before a meal. Pt needs to keep upcoming appt in Sept for further refills 60 tablet 0   predniSONE (DELTASONE) 10 MG tablet Take 34m po daily until 04/18/21, then decrease dose by 562mevery week until complete 60 tablet 0   thiamine 100 MG tablet Take 1 tablet (100 mg total) by mouth daily. 30 tablet 1   No current facility-administered medications for this visit.    Allergies as of 05/22/2021   (No Known Allergies)    Family History  Problem Relation Age of Onset   High blood pressure Mother    High Cholesterol Mother    Colon cancer Mother    High blood pressure Father    High Cholesterol Father     Social History   Socioeconomic History   Marital status: Married    Spouse name: Not on file   Number of children: Not on file   Years of education: Not on file   Highest education level: Not on  file  Occupational History   Not on file  Tobacco Use   Smoking status: Every Day    Packs/day: 1.00    Types: Cigarettes   Smokeless tobacco: Never  Vaping Use   Vaping Use: Never used  Substance and Sexual Activity   Alcohol use: Not Currently    Comment: stopped 01/12/20. started back 10/2020   Drug use: Yes    Types: Methamphetamines, Marijuana    Comment: Prior marijuana.  intermittent use of vicodin (not prescribed). Meth 01/2021   Sexual activity: Not on file  Other Topics Concern   Not on file  Social History Narrative   Not on file   Social Determinants of Health   Financial Resource Strain: Not on file  Food Insecurity: Not on file  Transportation Needs: Not on file  Physical Activity: Not on file  Stress: Not on file  Social Connections: Not on file  Review of systems General: negative for malaise, night sweats, fever, chills, weight loss Neck: Negative for lumps, goiter, pain and significant neck swelling Resp: Negative for cough, wheezing, dyspnea at rest CV: Negative for chest pain, leg swelling, palpitations, orthopnea GI: denies melena, hematochezia, nausea, vomiting, diarrhea, constipation, dysphagia, odyonophagia, early satiety or unintentional weight loss. +reflux MSK: Negative for joint pain or swelling, back pain, and muscle pain. Derm: Negative for itching or rash Psych: Denies depression, anxiety, memory loss, confusion. No homicidal or suicidal ideation.  Heme: Negative for prolonged bleeding, bruising easily, and swollen nodes. Endocrine: Negative for cold or heat intolerance, polyuria, polydipsia and goiter. Neuro: negative for tremor, gait imbalance, syncope and seizures. The remainder of the review of systems is noncontributory.  Physical Exam: There were no vitals taken for this visit. General:   Alert and oriented. No distress noted. Pleasant and cooperative.  Head:  Normocephalic and atraumatic. Eyes:  Conjuctiva clear without scleral  icterus. Mouth:  Oral mucosa pink and moist. Good dentition. No lesions. Heart: Normal rate and rhythm, s1 and s2 heart sounds present.  Lungs: Clear lung sounds in all lobes. Respirations equal and unlabored. Abdomen:  +BS, soft, non-tender and non-distended. No rebound or guarding. No HSM or masses noted. Derm: No palmar erythema, very mild jaundice Msk:  Symmetrical without gross deformities. Normal posture. Extremities:  Without edema. Neurologic:  Alert and  oriented x4 Psych:  Alert and cooperative. Normal mood and affect.  Invalid input(s): 6 MONTHS   ASSESSMENT: Omar Mccarthy is a 30 y.o. male presenting today for hospital follow up after admission for alcoholic hepatitis on 34/19/37.  Patient is doing well today. He completed 28 day course of prednisone yesterday. Appetite and weight have been good. He has not had any alcohol since prior to his admission. He has not undergone and therapy/counseling or group meetings but we discussed in depth that alcohol cessation in patients with significant history of alcohol abuse is multifaceted, and often requires good support system of friends/family, avoiding those who continue to drink to avoid temptation and implementing therapy/counseling or atleast AA meetings to help deal with underlying issues/ having support of others with similar experiences. He is not currently on anything for anxiety but I encouraged him to establish with new PCP or go back to the health department to possibly start a daily medication for this as he states he had a small amount of ativan on discharge but was given no refills. We also discussed that benzos are not typically ideal for long term management of chronic anxiety/depression. He is not currently on his PPI due to financial issues but is working to get this medication and restart it. Reflux has improved some since he has stopped drinking, having symptoms maybe every other day. He has no nausea or vomiting, rectal  bleeding or melena. Reassuringly labs on 12/20 had improved significantly. We will repeat labs in 1 month. Patient verbalized understanding of all of the above, all questions were answered.   PLAN:  CBC, CMP, INR in 1 month  Please continue to avoid alcohol and try to avoid being around people that drink to avoid temptation. It is very important with history of alcohol abuse to undergo some type of therapy/counseling/or group meetings to help you stay on track as this is a disease and often requires multiple aspects to continue to avoid drinking.  Restart PPI when you are able to. Continue to take multivitamin daily and thiamine 153m daily.  Please continue to avoid tylenol  for now and use NSAIDs (buprofen, aleve, naproxen, goody powder) sparingly.  Ensure a diet high in fiber, fruits, veggies, whole grains and drinking plenty of water as you had evidence of fatty liver on your ultrasound, this can eventually progress to cirrhosis, especially given your history of alcohol use.  Please follow up with health department or try to establish with another primary care provider to help manage your anxiety/depression, as this is also a key component in staying sober.  Please discuss with your cardiologist about restarting your eliquis, as they will likely want you to given your history of a fib.   Follow Up: 3 months  Chelsea L. Alver Sorrow, MSN, APRN, AGNP-C Adult-Gerontology Nurse Practitioner [facility]

## 2021-05-22 NOTE — Patient Instructions (Addendum)
We will update some labs today to ensure that liver function continues to move in the right direction  Please continue to avoid alcohol and try to avoid being around people that drink to avoid temptation. It is very important with history of alcohol abuse to undergo some type of therapy/counseling/or group meetings to help you stay on track as this is a disease and often requires multiple aspects to continue to avoid drinking.  Make sure you get your pantoprazole 40mg  (acid reflux medicine) when you can and take this twice a day. Continue to take multivitamin daily and thiamine 100mg  daily.  Please continue to avoid tylenol for now and use NSAIDs (buprofen, aleve, naproxen, goody powder) sparingly.  Make sure you are eating a diet high in fiber, fruits, veggies, whole grains and drinking plenty of water as you had evidence of fatty liver on your ultrasound, this can eventually progress to cirrhosis, especially given your history of alcohol use.  Please follow up with health department or try to establish with another primary care provider to help manage your anxiety/depression, as this is also a key component in staying sober.  Please discuss with your cardiologist about restarting your eliquis, as they will likely want you to given your history of a fib.   Follow up 3 month

## 2021-07-20 ENCOUNTER — Ambulatory Visit: Payer: Medicaid Other | Admitting: Cardiology

## 2021-07-20 NOTE — Progress Notes (Unsigned)
Clinical Summary Omar Mccarthy is a 31 y.o.male  1.Afib - 12/2019 admission with new onset afib with RVR in setting of severe  EtOH withdrawal and sepsis - CHADS2Vasc score is 2, initially did not start anticoag due to chronic EtoH use and thrombocytopenia   - from 03/2021 notes stopped eliquis due to mouth bleeding  2. Chronic systolic HF - - echo LVEF 40%, mod RV dysfunction - likely EtOH mediated vs tachy mediated CM. No plan for ischemic testing.  - no further diuresis at this time - hold on ACE/ARB/ARNI as need room with bp for av nodal agents at this time with his afib - consolidated lopressor to toprol prior to discharge   3. 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 diagnosed in 12/2019   Hypertension      No Known Allergies   Current Outpatient Medications  Medication Sig Dispense Refill   digoxin (LANOXIN) 0.125 MG tablet TAKE 1 TABLET BY MOUTH DAILY 30 tablet 5   lactulose (CHRONULAC) 10 GM/15ML solution Take 30 mLs (20 g total) by mouth daily. (Patient not taking: Reported on 05/22/2021) 236 mL 0   LORazepam (ATIVAN) 1 MG tablet Take 1 tablet (1 mg total) by mouth every 6 (six) hours as needed for anxiety (tremors). (Patient not taking: Reported on 05/22/2021) 30 tablet 0   metoprolol succinate (TOPROL-XL) 100 MG 24 hr tablet TAKE 1 TABLET(100 MG) BY MOUTH DAILY WITH OR IMMEDIATELY FOLLOWING A MEAL 30 tablet 5   Multiple Vitamin (MULTIVITAMIN WITH MINERALS) TABS tablet Take 1 tablet by mouth daily. 30 tablet 2   pantoprazole (PROTONIX) 40 MG tablet Take 1 tablet (40 mg total) by mouth 2 (two) times daily before a meal. Pt needs to keep upcoming appt in Sept for further refills (Patient not taking: Reported on 05/22/2021) 60 tablet 0   predniSONE  (DELTASONE) 10 MG tablet Take 30mg  po daily until 04/18/21, then decrease dose by 5mg  every week until complete (Patient not taking: Reported on 05/22/2021) 60 tablet 0   thiamine 100 MG tablet Take 1 tablet (100 mg total) by mouth daily. (Patient not taking: Reported on 05/22/2021) 30 tablet 1   No current facility-administered medications for this visit.     Past Surgical History:  Procedure Laterality Date   CARDIOVERSION N/A 02/24/2020   Procedure: CARDIOVERSION;  Surgeon: 05/24/2021, MD;  Location: [facility];  Service: Cardiovascular;  Laterality: N/A;     No Known Allergies    Family History  Problem Relation Age of Onset   High blood pressure Mother    High Cholesterol Mother    Colon cancer Mother    High blood pressure Father    High Cholesterol Father      Social History Mr. Randle reports that he has been smoking cigarettes. He has been smoking an average of 1 pack per day. He has never used smokeless tobacco. Mr. Weier reports that he does not currently use alcohol.   Review of Systems CONSTITUTIONAL: No weight loss, fever, chills, weakness or fatigue.  HEENT: Eyes: No visual loss, blurred vision, double vision or yellow sclerae.No hearing loss, sneezing, congestion, runny nose or sore throat.  SKIN: No rash or itching.  CARDIOVASCULAR:  RESPIRATORY: No shortness of breath, cough or sputum.  GASTROINTESTINAL:  No anorexia, nausea, vomiting or diarrhea. No abdominal pain or blood.  GENITOURINARY: No burning on urination, no polyuria NEUROLOGICAL: No headache, dizziness, syncope, paralysis, ataxia, numbness or tingling in the extremities. No change in bowel or bladder control.  MUSCULOSKELETAL: No muscle, back pain, joint pain or stiffness.  LYMPHATICS: No enlarged nodes. No history of splenectomy.  PSYCHIATRIC: No history of depression or anxiety.  ENDOCRINOLOGIC: No reports of sweating, cold or heat intolerance. No polyuria or polydipsia.   Marland Kitchen   Physical Examination There were no vitals filed for this visit. There were no vitals filed for this visit.  Gen: resting comfortably, no acute distress HEENT: no scleral icterus, pupils equal round and reactive, no palptable cervical adenopathy,  CV Resp: Clear to auscultation bilaterally GI: abdomen is soft, non-tender, non-distended, normal bowel sounds, no hepatosplenomegaly MSK: extremities are warm, no edema.  Skin: warm, no rash Neuro:  no focal deficits Psych: appropriate affect   Diagnostic Studies 03/2021 echo IMPRESSIONS     1. Left ventricular ejection fraction, by estimation, is 70 to 75%. The  left ventricle has hyperdynamic function. The left ventricle has no  regional wall motion abnormalities. There is moderate asymmetric left  ventricular hypertrophy of the basal septal   segment. Left ventricular diastolic parameters were normal. Elevated left  atrial pressure. Mitral chordal SAM is noted with increased LVOT gradient  of 60-100 mmHg. This is consistent with outflow obstruction and more of a  hypertrophic cardiomyopathy  picture than the prior study.   2. Right ventricular systolic function is normal. The right ventricular  size is normal. There is normal pulmonary artery systolic pressure. The  estimated right ventricular systolic pressure is 29.2 mmHg.   3. Left atrial size was moderately dilated.   4. The mitral valve is abnormal. Moderate mitral valve regurgitation.   5. The aortic valve is tricuspid. Aortic valve regurgitation is not  visualized.   6. The inferior vena cava is normal in size with greater than 50%  respiratory variability, suggesting right atrial pressure of 3 mmHg.     Assessment and Plan        Antoine Poche, M.D., F.A.C.C.

## 2021-08-20 ENCOUNTER — Ambulatory Visit (INDEPENDENT_AMBULATORY_CARE_PROVIDER_SITE_OTHER): Payer: Medicaid Other | Admitting: Gastroenterology

## 2021-08-20 ENCOUNTER — Encounter (INDEPENDENT_AMBULATORY_CARE_PROVIDER_SITE_OTHER): Payer: Self-pay | Admitting: Gastroenterology

## 2021-09-05 IMAGING — US US EXTREM LOW VENOUS*L*
1 series · 13 of 24 positions shown · non-contrast
Comparison: None.

CLINICAL DATA: Lower extremity pain and edema

EXAM:
LEFT LOWER EXTREMITY VENOUS DUPLEX ULTRASOUND
TECHNIQUE: Gray-scale sonography with graded compression, as well as color
Doppler and duplex ultrasound were performed to evaluate the left
lower extremity deep venous system from the level of the common
femoral vein and including the common femoral, femoral, profunda
femoral, popliteal and calf veins including the posterior tibial,
peroneal and gastrocnemius veins when visible. The superficial great
saphenous vein was also interrogated. Spectral Doppler was utilized
to evaluate flow at rest and with distal augmentation maneuvers in
the common femoral, femoral and popliteal veins.

[Series 1: us venous img lower uni left (dvt) · portal-venous · 13 of 35 slices shown]
[im 1/35]
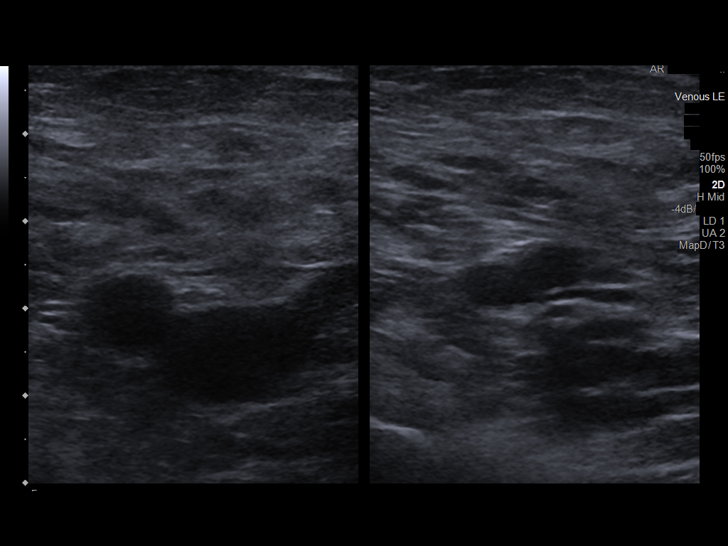
[im 3/35]
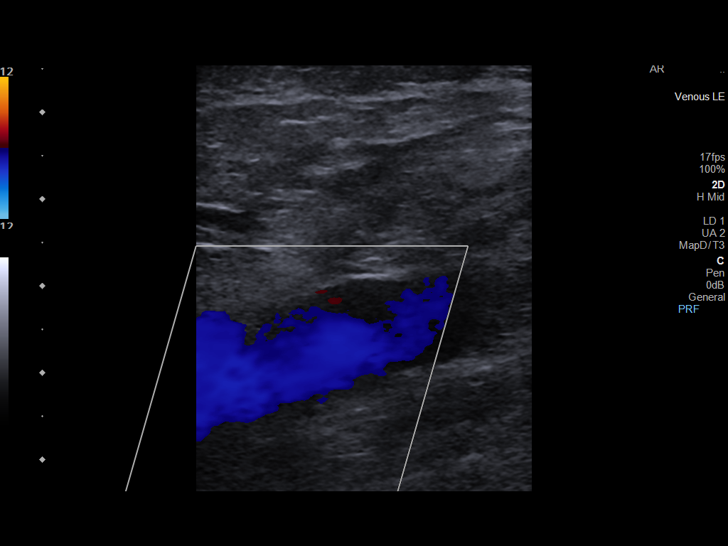
[im 6/35]
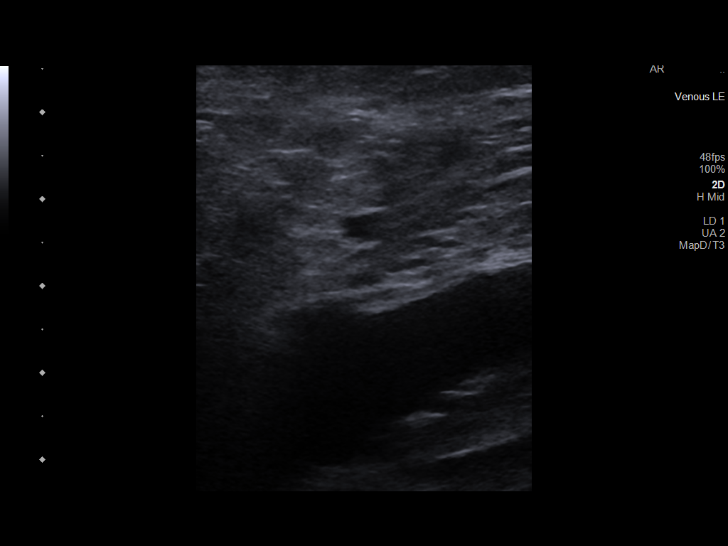
[im 9/35]
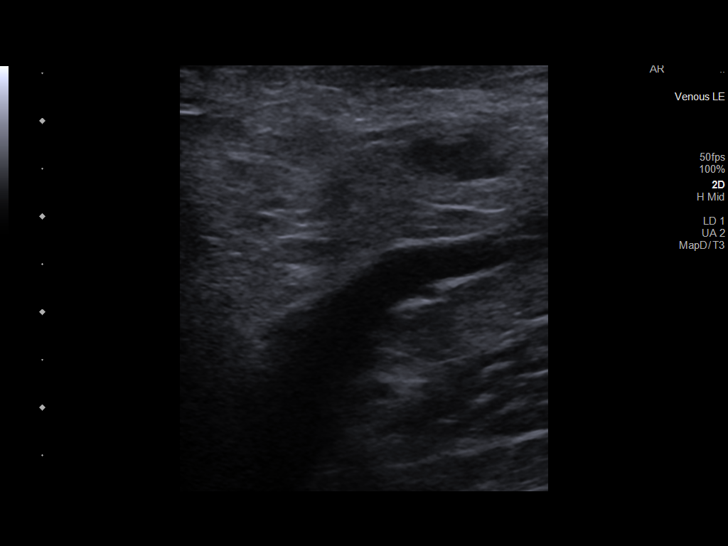
[im 12/35]
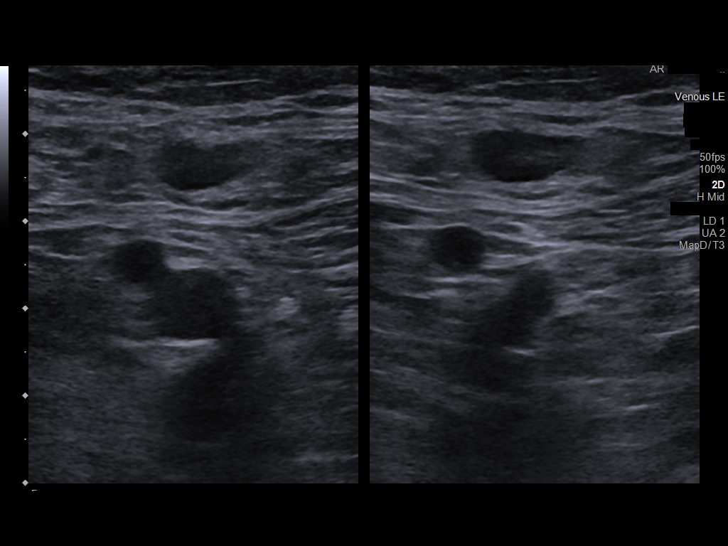
[im 15/35]
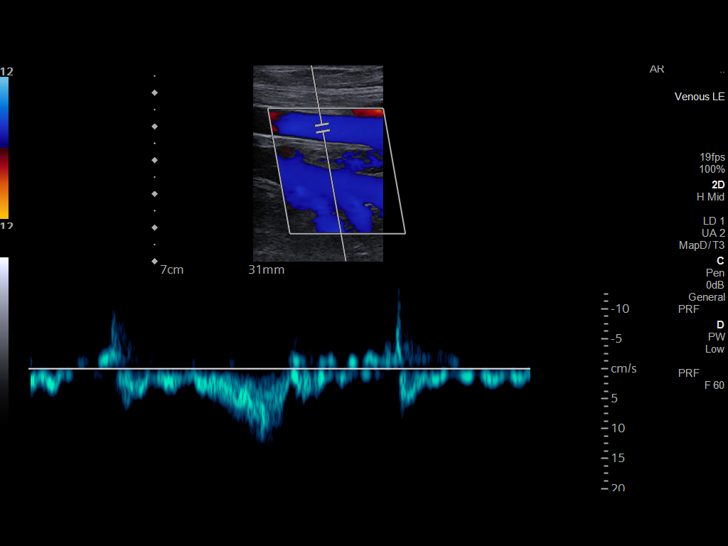
[im 18/35]
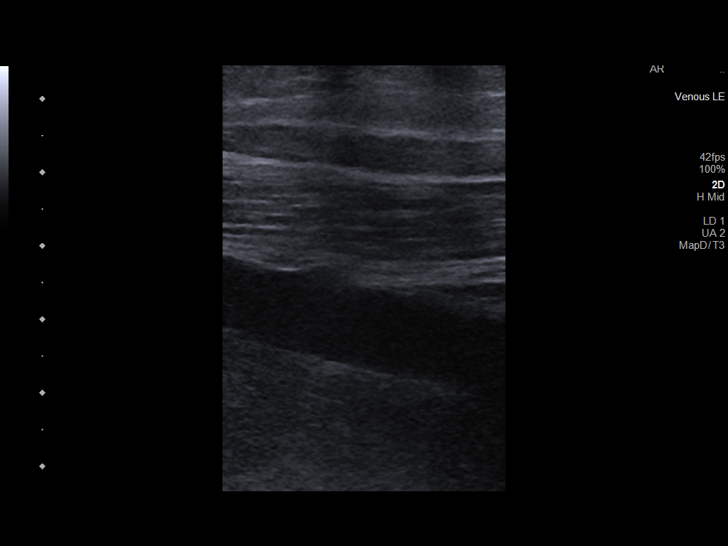
[im 20/35]
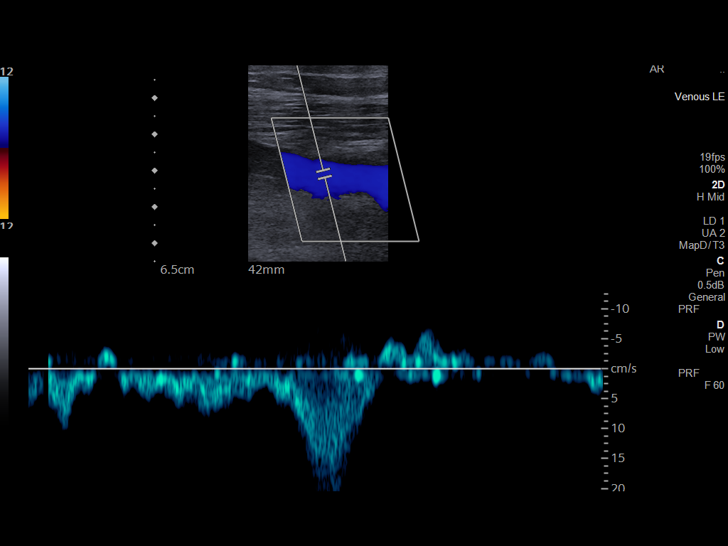
[im 23/35]
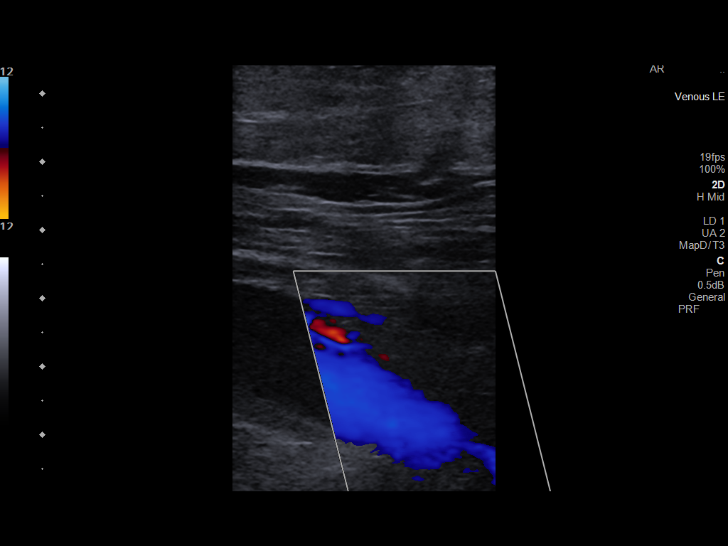
[im 26/35]
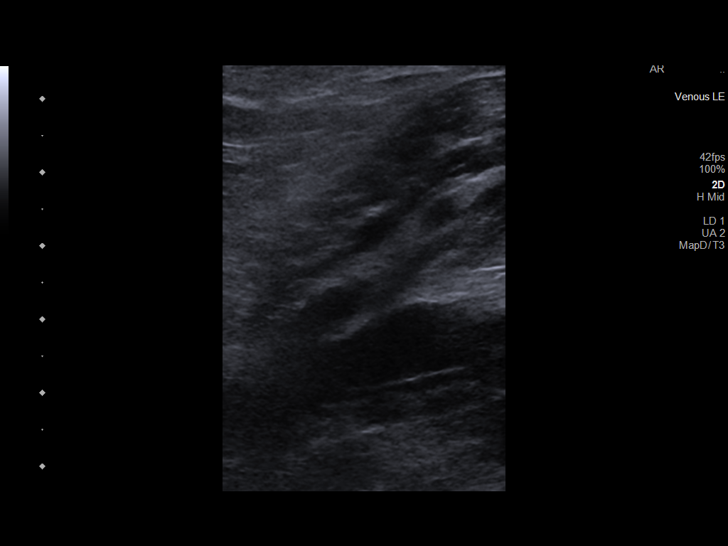
[im 29/35]
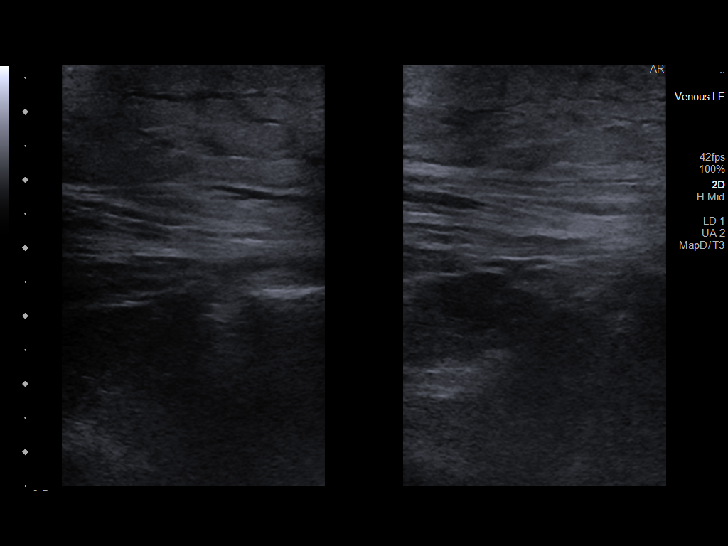
[im 32/35]
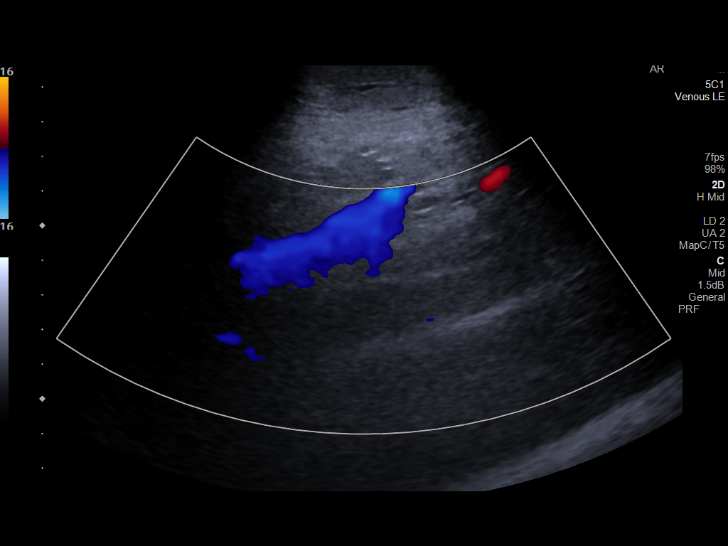
[im 35/35]
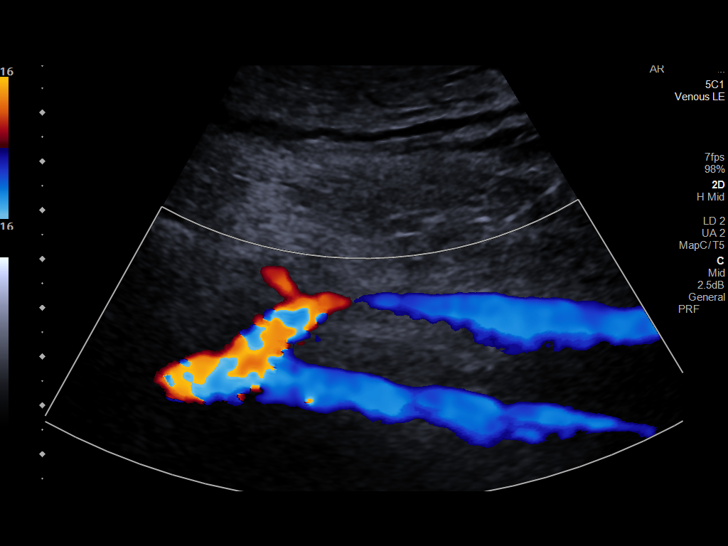

[13 of 24 positions shown; findings below may reference images not displayed]

FINDINGS: Contralateral Common Femoral Vein: Respiratory phasicity is normal
and symmetric with the symptomatic side. No evidence of thrombus.
Normal compressibility.

Common Femoral Vein: No evidence of thrombus. Normal
compressibility, respiratory phasicity and response to augmentation.

Saphenofemoral Junction: No evidence of thrombus. Normal
compressibility and flow on color Doppler imaging.

Profunda Femoral Vein: No evidence of thrombus. Normal
compressibility and flow on color Doppler imaging.

Femoral Vein: No evidence of thrombus. Normal compressibility,
respiratory phasicity and response to augmentation.

Popliteal Vein: No evidence of thrombus. Normal compressibility,
respiratory phasicity and response to augmentation.

Calf Veins: No evidence of thrombus. Normal compressibility and flow
on color Doppler imaging.

Superficial Great Saphenous Vein: No evidence of thrombus. Normal
compressibility.

Venous Reflux:  None.

Other Findings:  There is calf region edema.
IMPRESSION: No evidence of deep venous thrombosis in the left lower extremity.
Right common femoral vein patent. There is left lower extremity calf
region edema.

## 2021-09-05 IMAGING — DX DG CHEST 1V PORT
1 series · 1 of 1 positions shown · non-contrast
Comparison: Chest x-ray 08/04/2019.

CLINICAL DATA: 28-year-old male with history of tachycardia and leg
swelling.

EXAM:
PORTABLE CHEST 1 VIEW

[chest ap]
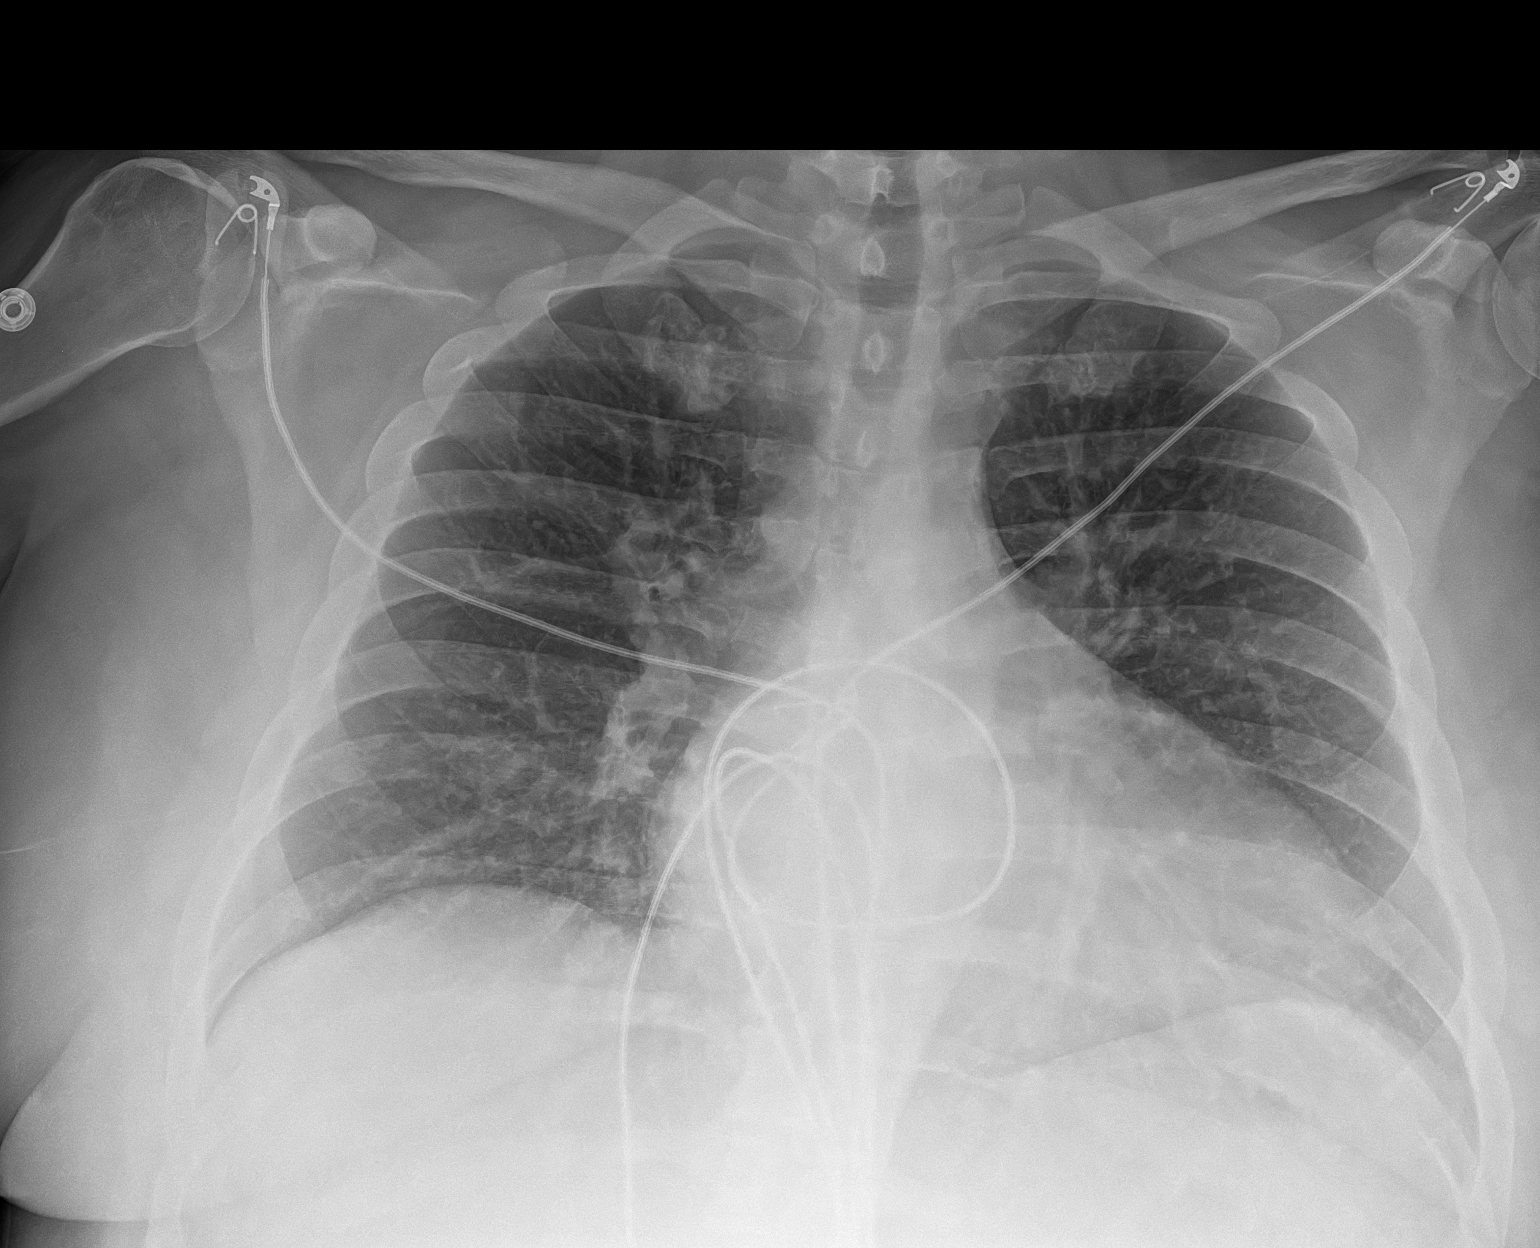

[1 of 1 positions shown; findings below may reference images not displayed]

FINDINGS: Lung volumes are normal. No consolidative airspace disease. No
pleural effusions. No pneumothorax. No pulmonary nodule or mass
noted. Pulmonary vasculature and the cardiomediastinal silhouette
are within normal limits.
IMPRESSION: No radiographic evidence of acute cardiopulmonary disease.

## 2021-09-06 IMAGING — US US ABDOMEN LIMITED
1 series · 14 of 25 positions shown · non-contrast
Comparison: None.

CLINICAL DATA: Elevated liver enzymes

EXAM:
ULTRASOUND ABDOMEN LIMITED RIGHT UPPER QUADRANT

[Series 1: us abdomen limited ruq · 14 of 43 slices shown]
[im 1/43]
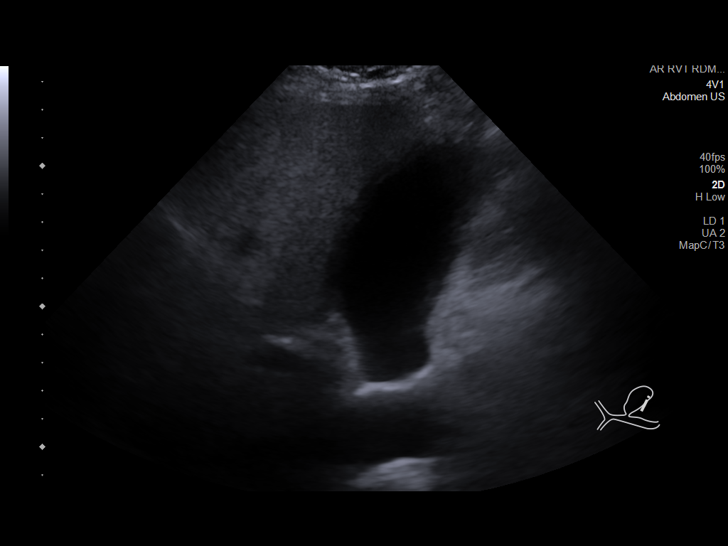
[im 4/43]
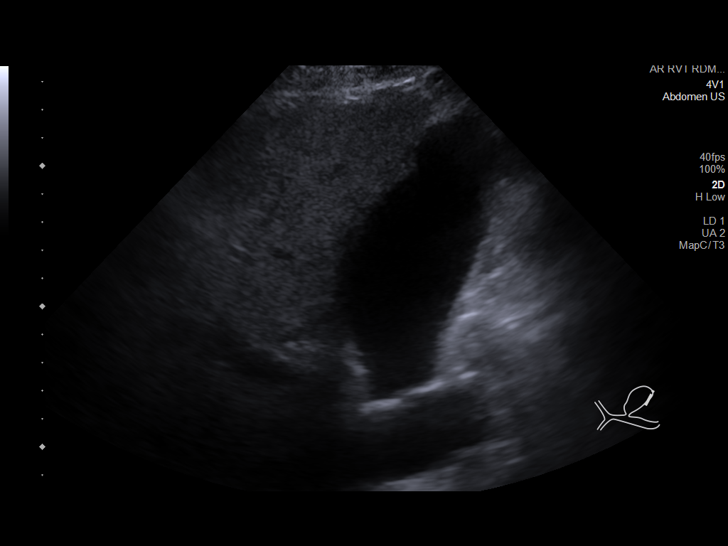
[im 8/43]
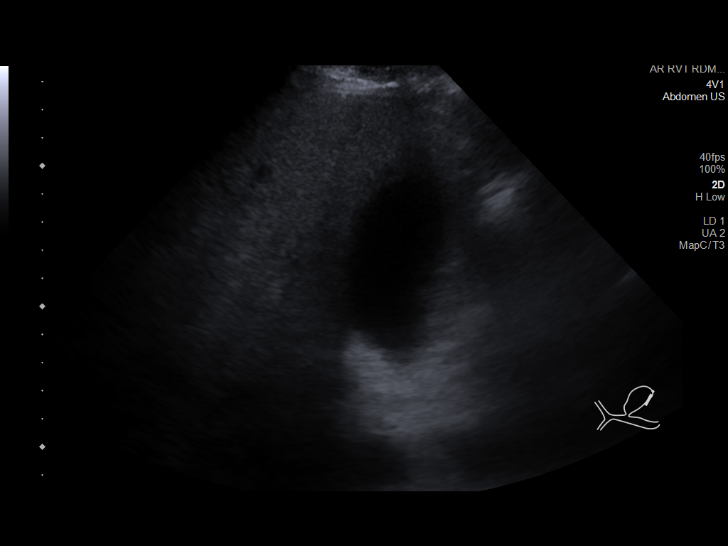
[im 11/43]
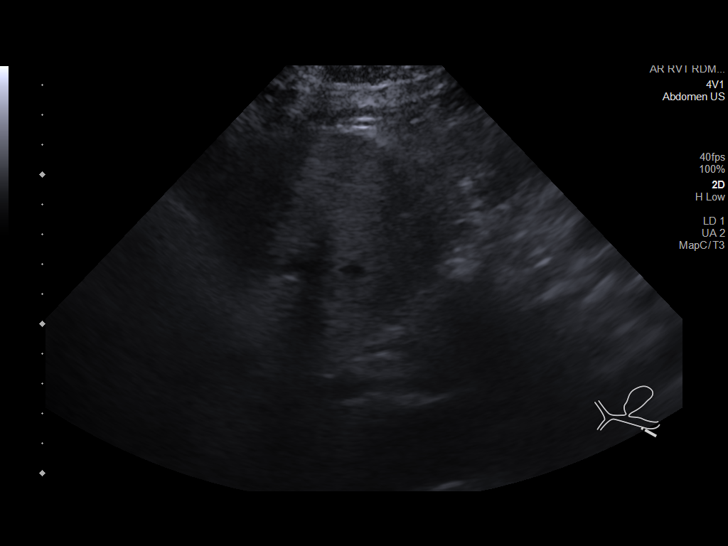
[im 15/43]
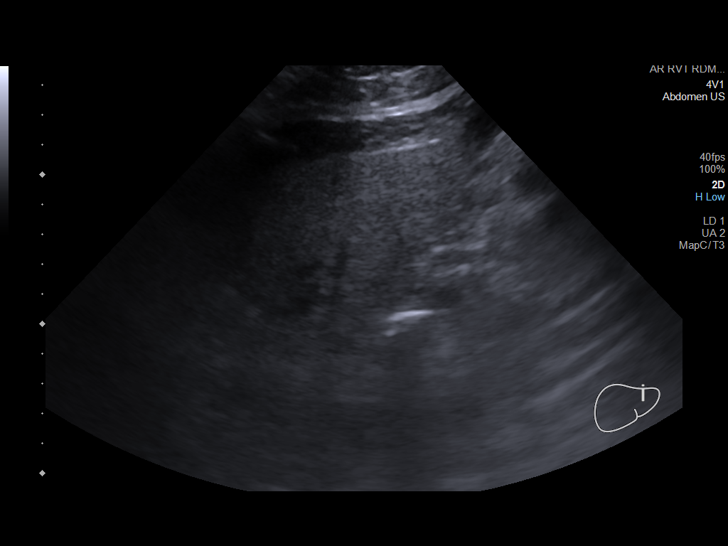
[im 16/43]
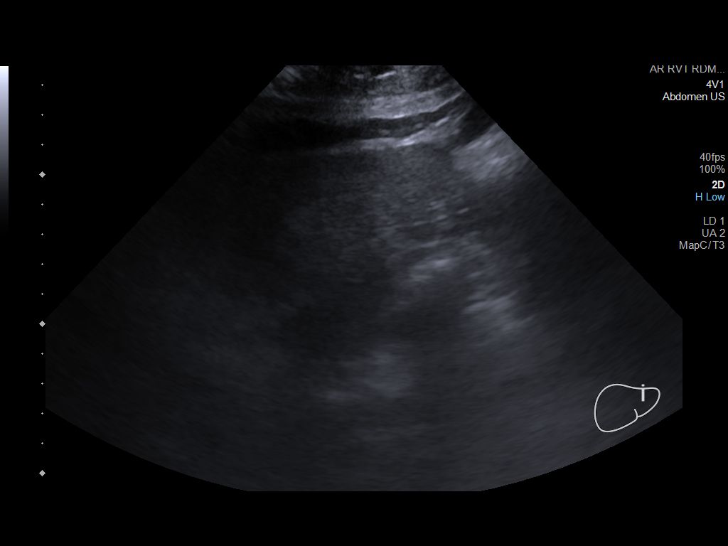
[im 20/43]
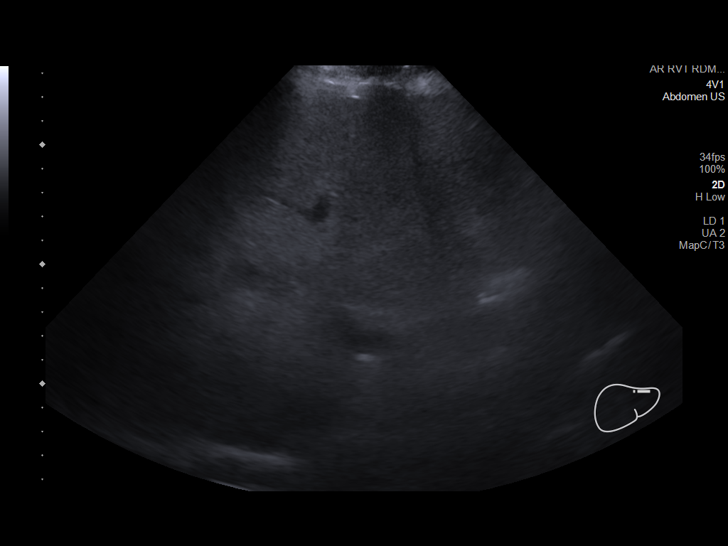
[im 23/43]
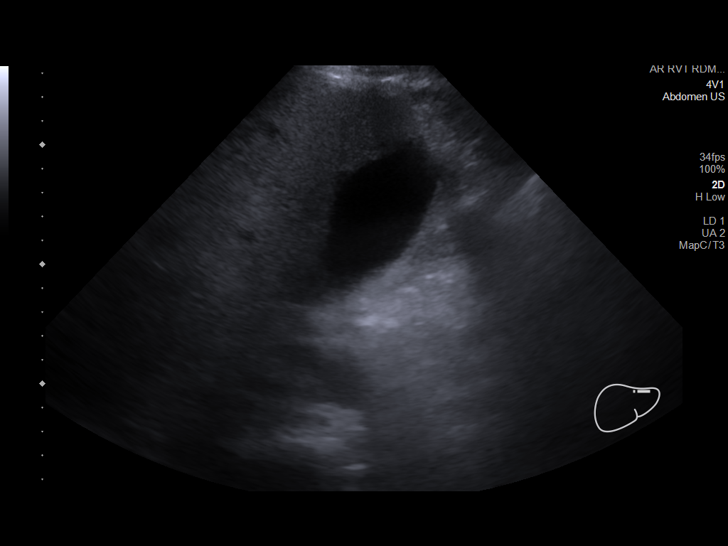
[im 27/43]
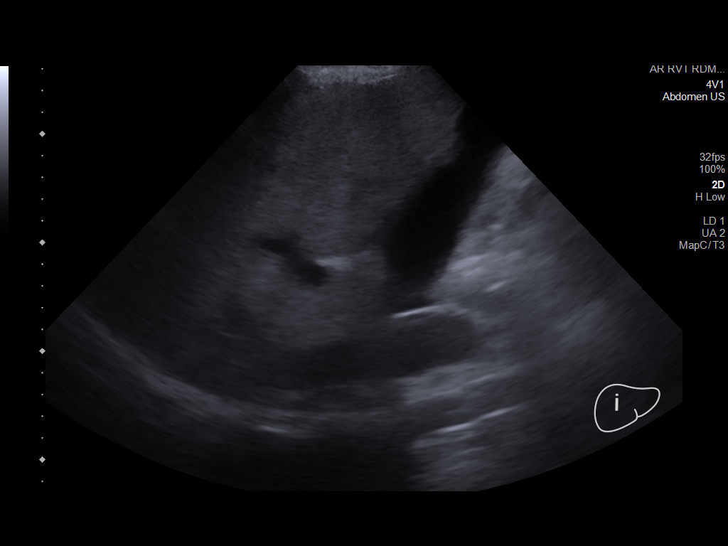
[im 29/43]
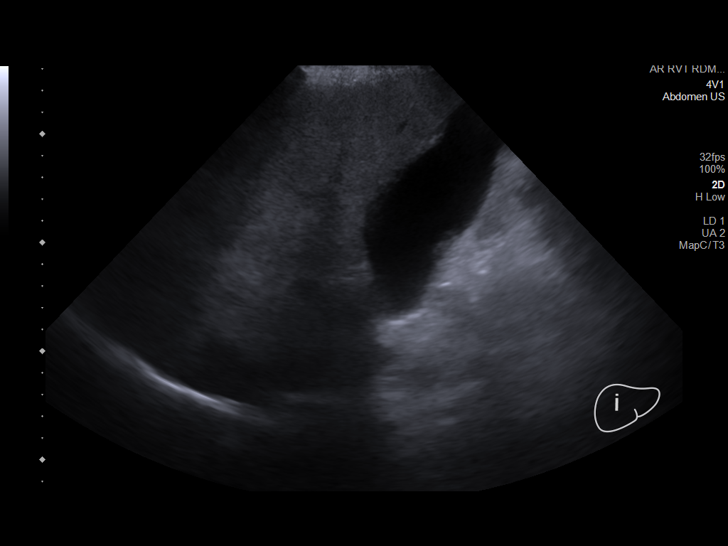
[im 32/43]
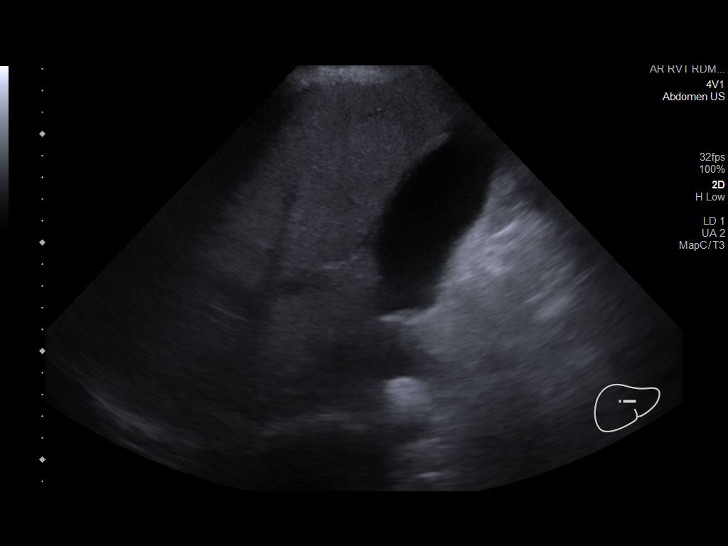
[im 36/43]
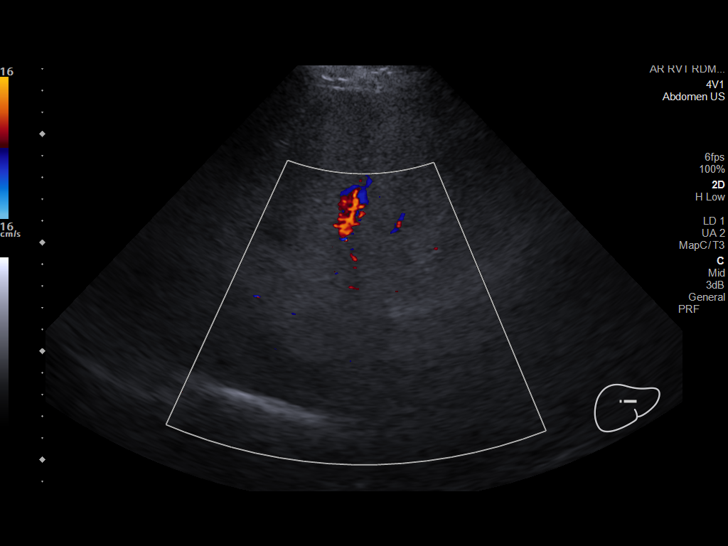
[im 39/43]
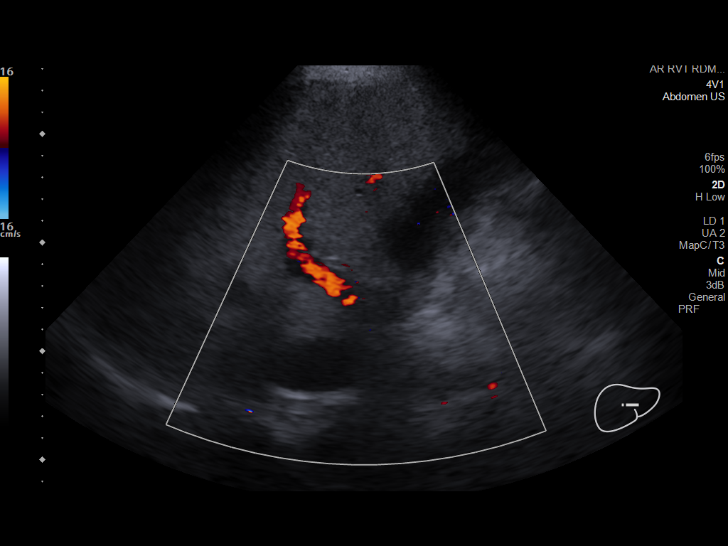
[im 43/43]
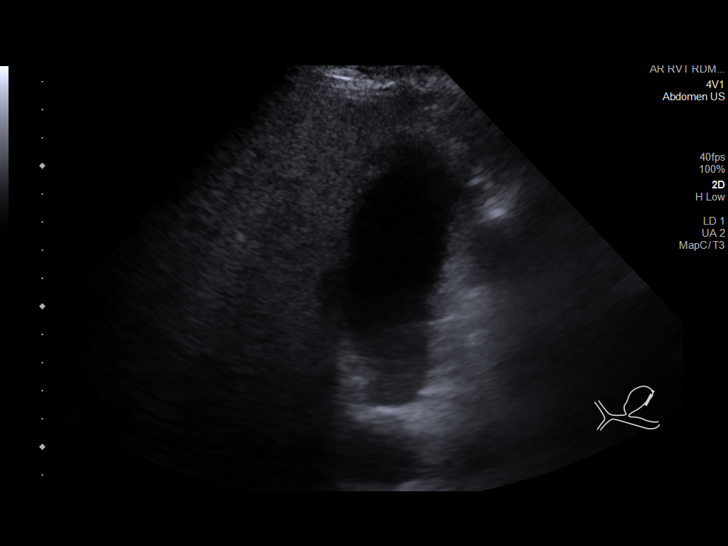

[14 of 25 positions shown; findings below may reference images not displayed]

FINDINGS: Gallbladder:

No gallstones or wall thickening visualized. There is no
pericholecystic fluid. No sonographic Murphy sign noted by
sonographer.

Common bile duct:

Diameter: 4 mm. No intrahepatic or extrahepatic biliary duct
dilatation.

Liver:

No focal lesion identified. Liver echogenicity is increased
diffusely. Portal vein is patent on color Doppler imaging with
normal direction of blood flow towards the liver.

Other: None.
IMPRESSION: Diffuse increase in liver echogenicity, a finding indicative of
hepatic steatosis, potentially with underlying parenchymal liver
disease as well. No focal liver lesions are evident; it must be
cautioned that the sensitivity of ultrasound for detection of focal
liver lesions is diminished in this circumstance.

Study otherwise unremarkable.

## 2021-09-07 IMAGING — DX DG CHEST 1V PORT
1 series · 2 of 2 positions shown · non-contrast
Comparison: 01/11/2020, CT 01/12/2020

CLINICAL DATA: Post intubation

EXAM:
PORTABLE CHEST 1 VIEW

[Series 2: chest ap grid · 0.14mm/px · 2 of 2 slices shown]
[im 1/2]
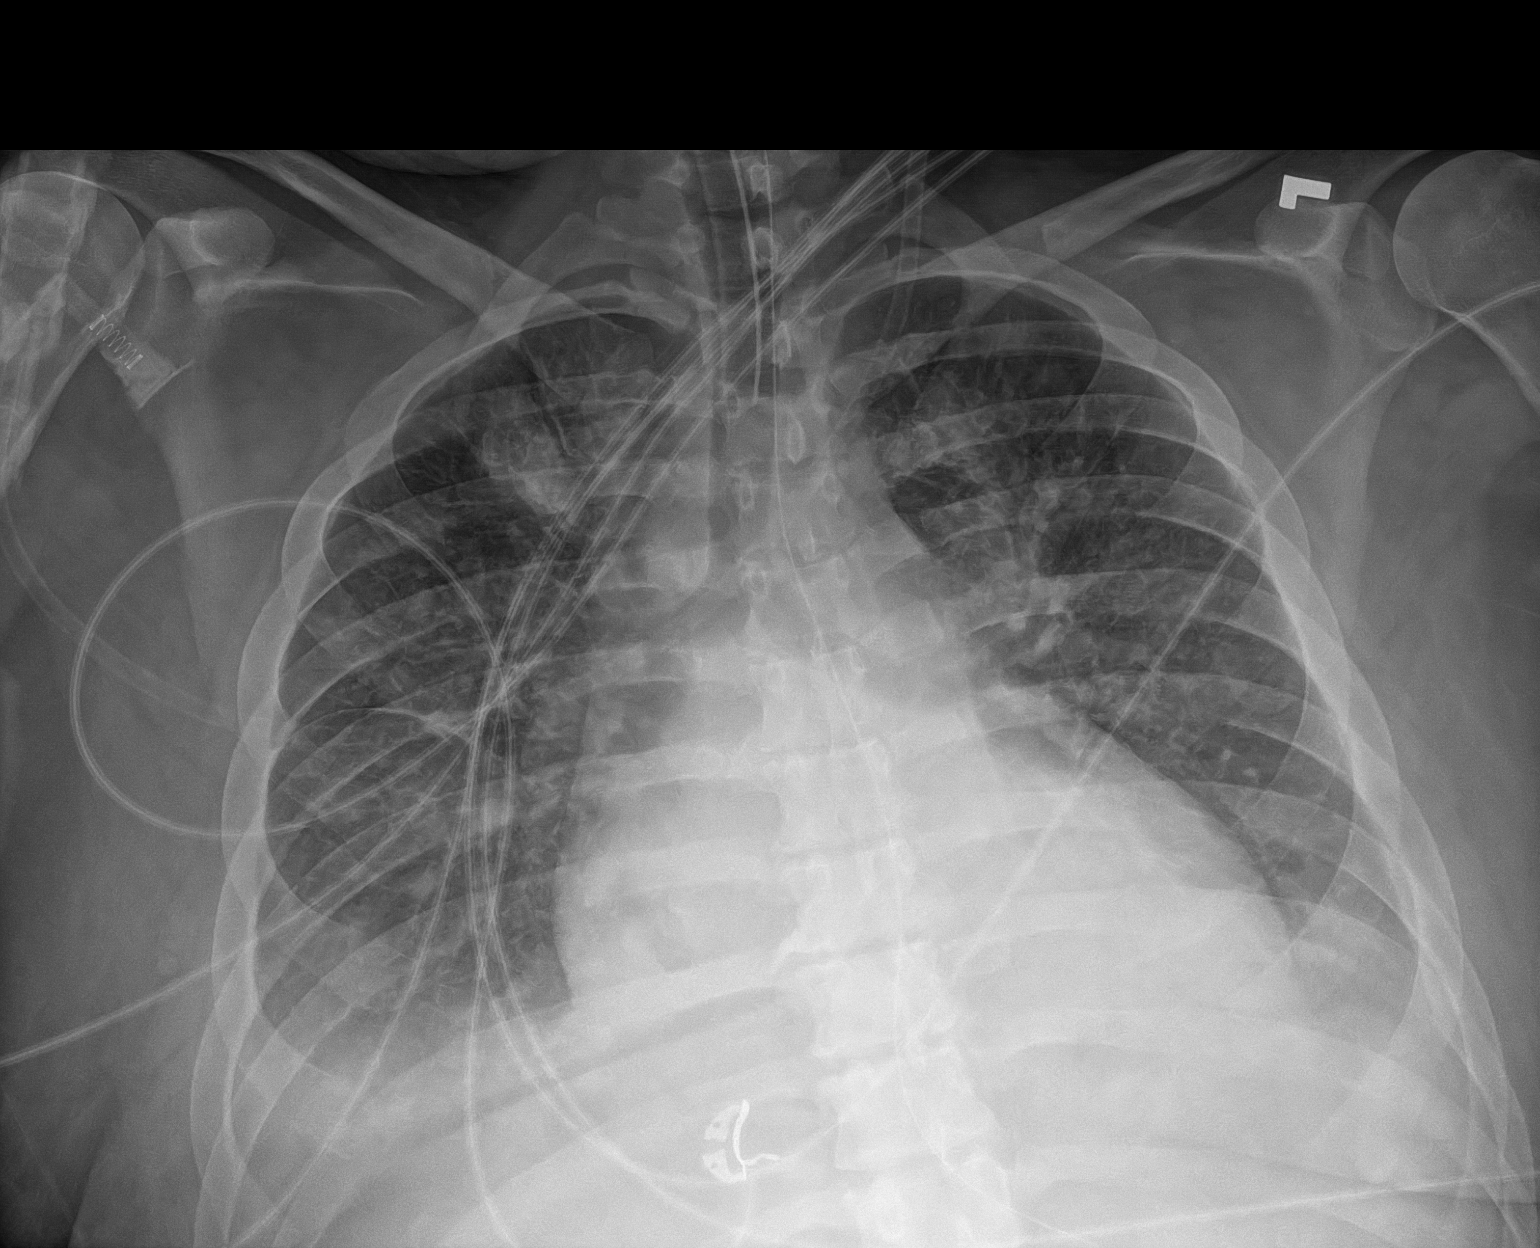
[im 2/2]
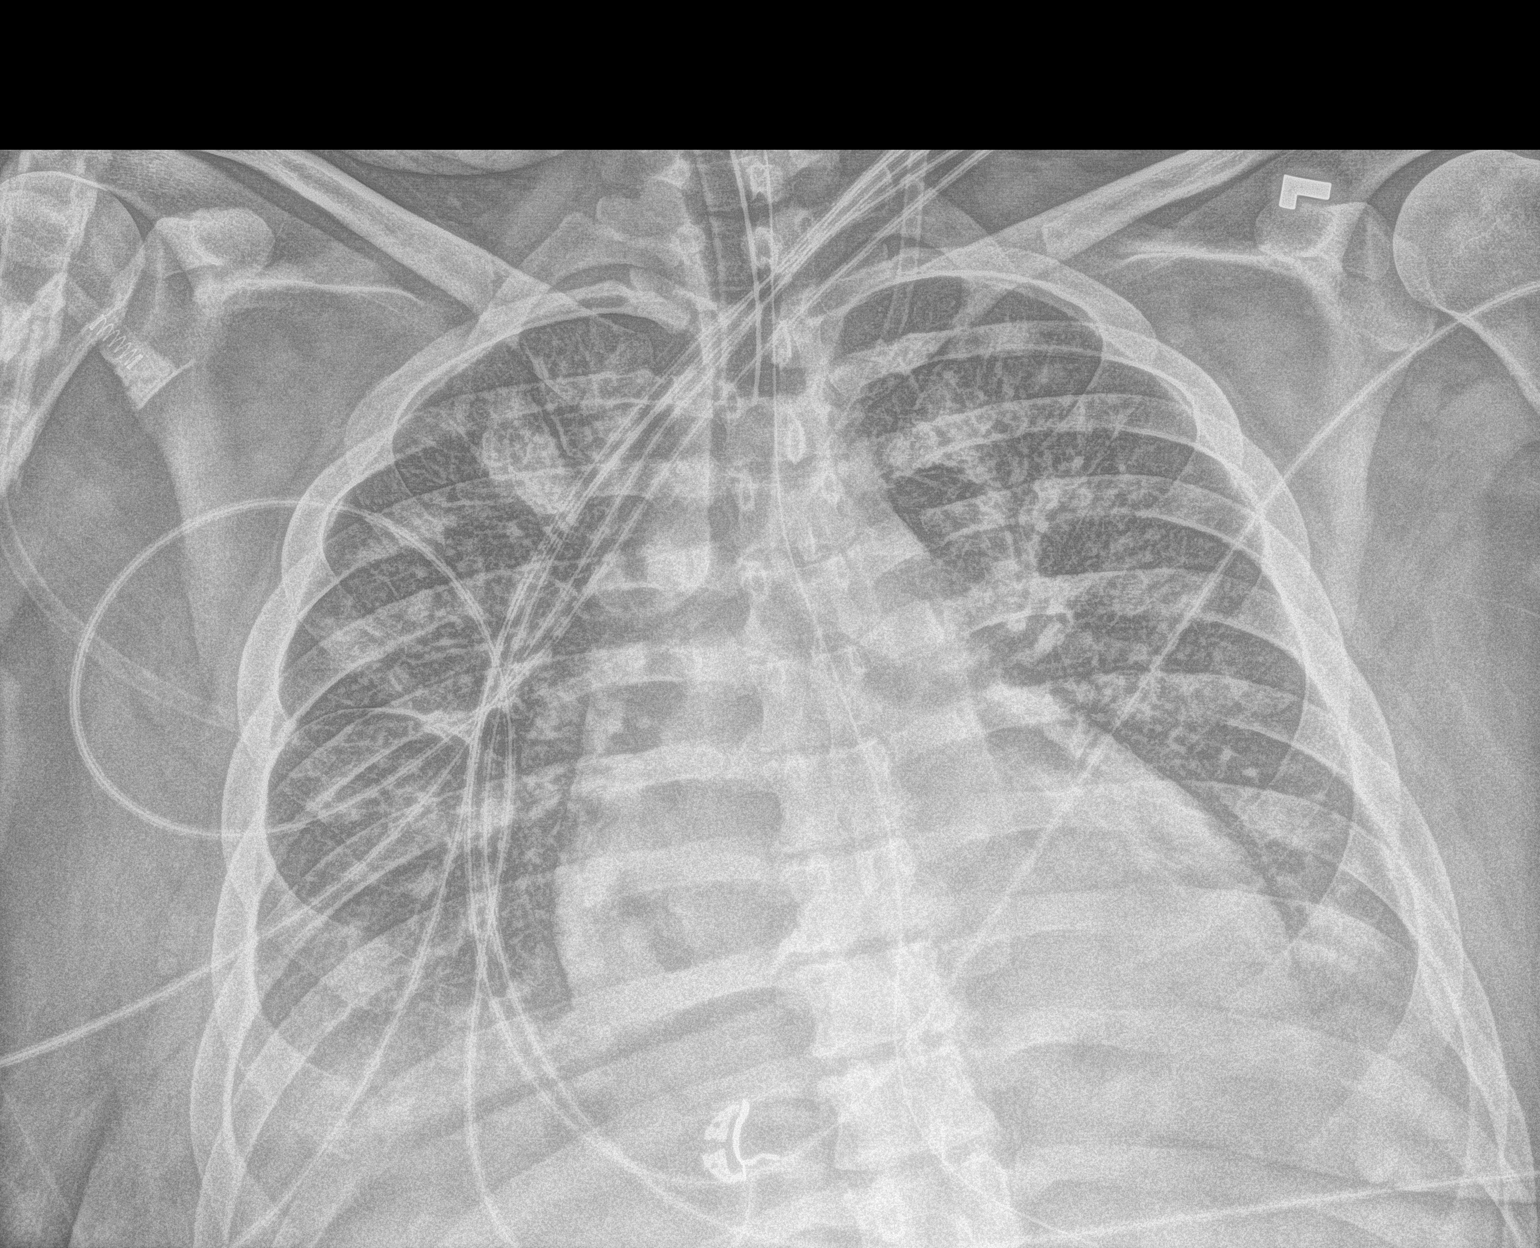

[2 of 2 positions shown; findings below may reference images not displayed]

FINDINGS: Cardiomegaly with vascular congestion and hazy perihilar opacities
suspicious for mild pulmonary edema. At least small bilateral
effusions.

Endotracheal tube tip is about 4.3 cm superior to carina. Esophageal
tube tip is below the diaphragm but is incompletely visualized.
IMPRESSION: 1. Cardiomegaly with vascular congestion and hazy perihilar
opacities suspicious for mild pulmonary edema. At least small
bilateral effusions.
2. Endotracheal tube tip about 4.3 cm superior to carina.

## 2021-10-14 ENCOUNTER — Other Ambulatory Visit: Payer: Self-pay | Admitting: Cardiology

## 2021-10-15 ENCOUNTER — Other Ambulatory Visit: Payer: Self-pay | Admitting: Cardiology

## 2021-10-23 ENCOUNTER — Encounter (HOSPITAL_COMMUNITY): Payer: Self-pay

## 2021-10-23 ENCOUNTER — Other Ambulatory Visit: Payer: Self-pay

## 2021-10-23 ENCOUNTER — Inpatient Hospital Stay (HOSPITAL_COMMUNITY)
Admission: EM | Admit: 2021-10-23 | Discharge: 2021-10-25 | DRG: 309 | Disposition: A | Discharge status: Home / Self Care | Payer: Self-pay | Attending: Family Medicine | Admitting: Family Medicine

## 2021-10-23 ENCOUNTER — Emergency Department (HOSPITAL_COMMUNITY): Payer: Self-pay

## 2021-10-23 DIAGNOSIS — I5022 Chronic systolic (congestive) heart failure: Secondary | ICD-10-CM | POA: Diagnosis present

## 2021-10-23 DIAGNOSIS — Z79899 Other long term (current) drug therapy: Secondary | ICD-10-CM

## 2021-10-23 DIAGNOSIS — K7010 Alcoholic hepatitis without ascites: Secondary | ICD-10-CM | POA: Diagnosis present

## 2021-10-23 DIAGNOSIS — R739 Hyperglycemia, unspecified: Secondary | ICD-10-CM | POA: Diagnosis present

## 2021-10-23 DIAGNOSIS — F17210 Nicotine dependence, cigarettes, uncomplicated: Secondary | ICD-10-CM | POA: Diagnosis present

## 2021-10-23 DIAGNOSIS — F1029 Alcohol dependence with unspecified alcohol-induced disorder: Secondary | ICD-10-CM

## 2021-10-23 DIAGNOSIS — F10129 Alcohol abuse with intoxication, unspecified: Secondary | ICD-10-CM | POA: Diagnosis present

## 2021-10-23 DIAGNOSIS — F10920 Alcohol use, unspecified with intoxication, uncomplicated: Secondary | ICD-10-CM

## 2021-10-23 DIAGNOSIS — Z716 Tobacco abuse counseling: Secondary | ICD-10-CM

## 2021-10-23 DIAGNOSIS — I110 Hypertensive heart disease with heart failure: Secondary | ICD-10-CM | POA: Diagnosis present

## 2021-10-23 DIAGNOSIS — F1011 Alcohol abuse, in remission: Secondary | ICD-10-CM | POA: Diagnosis present

## 2021-10-23 DIAGNOSIS — R197 Diarrhea, unspecified: Secondary | ICD-10-CM

## 2021-10-23 DIAGNOSIS — Z7141 Alcohol abuse counseling and surveillance of alcoholic: Secondary | ICD-10-CM

## 2021-10-23 DIAGNOSIS — I5020 Unspecified systolic (congestive) heart failure: Secondary | ICD-10-CM | POA: Diagnosis present

## 2021-10-23 DIAGNOSIS — Z91148 Patient's other noncompliance with medication regimen for other reason: Secondary | ICD-10-CM

## 2021-10-23 DIAGNOSIS — R112 Nausea with vomiting, unspecified: Secondary | ICD-10-CM | POA: Diagnosis present

## 2021-10-23 DIAGNOSIS — E669 Obesity, unspecified: Secondary | ICD-10-CM

## 2021-10-23 DIAGNOSIS — Z79891 Long term (current) use of opiate analgesic: Secondary | ICD-10-CM

## 2021-10-23 DIAGNOSIS — I422 Other hypertrophic cardiomyopathy: Secondary | ICD-10-CM

## 2021-10-23 DIAGNOSIS — F39 Unspecified mood [affective] disorder: Secondary | ICD-10-CM

## 2021-10-23 DIAGNOSIS — Z720 Tobacco use: Secondary | ICD-10-CM

## 2021-10-23 DIAGNOSIS — Y908 Blood alcohol level of 240 mg/100 ml or more: Secondary | ICD-10-CM | POA: Diagnosis present

## 2021-10-23 DIAGNOSIS — I4891 Unspecified atrial fibrillation: Principal | ICD-10-CM

## 2021-10-23 DIAGNOSIS — Z6834 Body mass index (BMI) 34.0-34.9, adult: Secondary | ICD-10-CM

## 2021-10-23 DIAGNOSIS — D696 Thrombocytopenia, unspecified: Secondary | ICD-10-CM | POA: Diagnosis present

## 2021-10-23 DIAGNOSIS — F32A Depression, unspecified: Secondary | ICD-10-CM | POA: Diagnosis present

## 2021-10-23 DIAGNOSIS — I480 Paroxysmal atrial fibrillation: Principal | ICD-10-CM | POA: Diagnosis present

## 2021-10-23 LAB — CBC WITH DIFFERENTIAL/PLATELET
Abs Immature Granulocytes: 0.02 K/uL (ref 0.00–0.07)
Basophils Absolute: 0.1 K/uL (ref 0.0–0.1)
Basophils Relative: 1 %
Eosinophils Absolute: 0 K/uL (ref 0.0–0.5)
Eosinophils Relative: 0 %
HCT: 45.4 % (ref 39.0–52.0)
Hemoglobin: 15.6 g/dL (ref 13.0–17.0)
Immature Granulocytes: 0 %
Lymphocytes Relative: 23 %
Lymphs Abs: 1.5 K/uL (ref 0.7–4.0)
MCH: 31.8 pg (ref 26.0–34.0)
MCHC: 34.4 g/dL (ref 30.0–36.0)
MCV: 92.7 fL (ref 80.0–100.0)
Monocytes Absolute: 0.6 K/uL (ref 0.1–1.0)
Monocytes Relative: 9 %
Neutro Abs: 4.5 K/uL (ref 1.7–7.7)
Neutrophils Relative %: 67 %
Platelets: 67 K/uL — ABNORMAL LOW (ref 150–400)
RBC: 4.9 MIL/uL (ref 4.22–5.81)
RDW: 13 % (ref 11.5–15.5)
WBC: 6.7 K/uL (ref 4.0–10.5)
nRBC: 0 % (ref 0.0–0.2)

## 2021-10-23 LAB — BASIC METABOLIC PANEL
Anion gap: 12 (ref 5–15)
BUN: 16 mg/dL (ref 6–20)
CO2: 22 mmol/L (ref 22–32)
Calcium: 8 mg/dL — ABNORMAL LOW (ref 8.9–10.3)
Chloride: 105 mmol/L (ref 98–111)
Creatinine, Ser: 0.87 mg/dL (ref 0.61–1.24)
GFR, Estimated: >60 mL/min (ref >60)
Glucose, Bld: 138 mg/dL — ABNORMAL HIGH (ref 70–99)
Potassium: 4.4 mmol/L (ref 3.5–5.1)
Sodium: 139 mmol/L (ref 135–145)

## 2021-10-23 LAB — PROTIME-INR
INR: 1.1 (ref 0.8–1.2)
Prothrombin Time: 14.2 seconds (ref 11.4–15.2)

## 2021-10-23 LAB — APTT: aPTT: 33 seconds (ref 24–36)

## 2021-10-23 LAB — TROPONIN I (HIGH SENSITIVITY)
Troponin I (High Sensitivity): 12 ng/L (ref <18)
Troponin I (High Sensitivity): 14 ng/L (ref <18)

## 2021-10-23 LAB — ETHANOL: Alcohol, Ethyl (B): 413 mg/dL (ref <10)

## 2021-10-23 LAB — DIGOXIN LEVEL: Digoxin Level: 0.6 ng/mL — ABNORMAL LOW (ref 0.8–2.0)

## 2021-10-23 MED ORDER — DILTIAZEM LOAD VIA INFUSION
20.0000 mg | Freq: Once | INTRAVENOUS | Status: AC
  Administered 2021-10-23: 20 mg via INTRAVENOUS
  Filled 2021-10-23: qty 20

## 2021-10-23 MED ORDER — LORAZEPAM 1 MG PO TABS
1.0000 mg | ORAL_TABLET | ORAL | Status: DC | PRN
  Administered 2021-10-23: 1 mg via ORAL

## 2021-10-23 MED ORDER — NICOTINE 21 MG/24HR TD PT24
21.0000 mg | MEDICATED_PATCH | Freq: Every day | TRANSDERMAL | Status: DC
  Administered 2021-10-23 – 2021-10-25 (×3): 21 mg via TRANSDERMAL
  Filled 2021-10-23 (×3): qty 1

## 2021-10-23 MED ORDER — THIAMINE HCL 100 MG/ML IJ SOLN
100.0000 mg | Freq: Every day | INTRAMUSCULAR | Status: DC
  Filled 2021-10-23: qty 2

## 2021-10-23 MED ORDER — DILTIAZEM HCL-DEXTROSE 125-5 MG/125ML-% IV SOLN (PREMIX)
5.0000 mg/h | INTRAVENOUS | Status: DC
  Administered 2021-10-23: 5 mg/h via INTRAVENOUS
  Filled 2021-10-23: qty 125

## 2021-10-23 MED ORDER — FOLIC ACID 1 MG PO TABS
1.0000 mg | ORAL_TABLET | Freq: Every day | ORAL | Status: DC
  Administered 2021-10-23 – 2021-10-25 (×3): 1 mg via ORAL
  Filled 2021-10-23 (×3): qty 1

## 2021-10-23 MED ORDER — SODIUM CHLORIDE 0.9 % IV BOLUS
1000.0000 mL | Freq: Once | INTRAVENOUS | Status: AC
  Administered 2021-10-23: 1000 mL via INTRAVENOUS

## 2021-10-23 MED ORDER — ADULT MULTIVITAMIN W/MINERALS CH
1.0000 | ORAL_TABLET | Freq: Every day | ORAL | Status: DC
  Administered 2021-10-23 – 2021-10-25 (×3): 1 via ORAL
  Filled 2021-10-23 (×3): qty 1

## 2021-10-23 MED ORDER — THIAMINE HCL 100 MG PO TABS
100.0000 mg | ORAL_TABLET | Freq: Every day | ORAL | Status: DC
  Administered 2021-10-23 – 2021-10-25 (×3): 100 mg via ORAL
  Filled 2021-10-23 (×3): qty 1

## 2021-10-23 MED ORDER — ACETAMINOPHEN 325 MG PO TABS
650.0000 mg | ORAL_TABLET | Freq: Four times a day (QID) | ORAL | Status: DC | PRN
  Administered 2021-10-23 – 2021-10-25 (×4): 650 mg via ORAL
  Filled 2021-10-23 (×4): qty 2

## 2021-10-23 MED ORDER — LORAZEPAM 2 MG/ML IJ SOLN
1.0000 mg | INTRAMUSCULAR | Status: DC | PRN
  Administered 2021-10-24 (×2): 2 mg via INTRAVENOUS
  Administered 2021-10-24: 4 mg via INTRAVENOUS
  Administered 2021-10-24: 2 mg via INTRAVENOUS
  Administered 2021-10-24: 4 mg via INTRAVENOUS
  Administered 2021-10-24 (×2): 2 mg via INTRAVENOUS
  Administered 2021-10-25: 4 mg via INTRAVENOUS
  Administered 2021-10-25 (×2): 2 mg via INTRAVENOUS
  Filled 2021-10-23 (×3): qty 1
  Filled 2021-10-23: qty 2
  Filled 2021-10-23 (×2): qty 1
  Filled 2021-10-23: qty 2
  Filled 2021-10-23 (×4): qty 1

## 2021-10-23 MED ORDER — METOPROLOL SUCCINATE ER 50 MG PO TB24
100.0000 mg | ORAL_TABLET | Freq: Every day | ORAL | Status: DC
  Administered 2021-10-23 – 2021-10-25 (×3): 100 mg via ORAL
  Filled 2021-10-23 (×3): qty 2

## 2021-10-23 NOTE — Discharge Instructions (Signed)
[address]       [address] [place] [address]       [phone]         [facility]       [phone] [address] [place] [address]       [phone] II       [address]       [place], [place] [address]       [phone], [place] [address]       [phone]         [facility] (Rehab for men only)       [address] [place], [place] [address]       [phone]

## 2021-10-23 NOTE — ED Notes (Signed)
CIWA is a 1. No ativan needed at this time

## 2021-10-23 NOTE — ED Notes (Signed)
Lab at bedside

## 2021-10-23 NOTE — ED Notes (Signed)
Pt has attempted to urinate different times unsuccessfully. Unable to provide a urine sample at this time  Pt is sitting up in bed eating  a salad at this time from outside source

## 2021-10-23 NOTE — ED Provider Notes (Signed)
[facility] Provider Note   CSN: [id number] Arrival date & time: 10/23/21  [id number]     History  Chief Complaint  Patient presents with   Shortness of Breath        Palpitations    Omar Mccarthy is a 31 y.o. male.  Patient is a 31 year old male with past medical history of atrial fibrillation on digoxin and metoprolol presenting for complaints of acute onset shortness of breath, dizziness, palpitations, nausea, vomiting that started yesterday and is worsening.  Patient is in A-fib with rapid ventricular response with a rate of 161 on arrival.  He has been compliant with his medications however intermittently misses a dose of digoxin once a week.  Patient also admits to diarrhea.  Patient also has concerns for depression without suicidal ideation.  The history is provided by the patient. No language interpreter was used.  Shortness of Breath Associated symptoms: diaphoresis   Associated symptoms: no abdominal pain, no chest pain, no cough, no ear pain, no fever, no rash, no sore throat and no vomiting   Palpitations Associated symptoms: diaphoresis, dizziness and shortness of breath   Associated symptoms: no back pain, no chest pain, no cough and no vomiting       Home Medications Prior to Admission medications   Medication Sig Start Date End Date Taking? Authorizing Provider  digoxin (LANOXIN) 0.125 MG tablet TAKE 1 TABLET BY MOUTH DAILY 10/15/21   Arnoldo Lenis, MD  lactulose (CHRONULAC) 10 GM/15ML solution Take 30 mLs (20 g total) by mouth daily. Patient not taking: Reported on 05/22/2021 04/15/21   Kathie Dike, MD  LORazepam (ATIVAN) 1 MG tablet Take 1 tablet (1 mg total) by mouth every 6 (six) hours as needed for anxiety (tremors). Patient not taking: Reported on 05/22/2021 04/14/21 04/14/22  Kathie Dike, MD  metoprolol succinate (TOPROL-XL) 100 MG 24 hr tablet TAKE 1 TABLET(100 MG) BY MOUTH DAILY WITH OR IMMEDIATELY FOLLOWING A MEAL  10/15/21   Arnoldo Lenis, MD  Multiple Vitamin (MULTIVITAMIN WITH MINERALS) TABS tablet Take 1 tablet by mouth daily. 04/14/21   Kathie Dike, MD  pantoprazole (PROTONIX) 40 MG tablet Take 1 tablet (40 mg total) by mouth 2 (two) times daily before a meal. Pt needs to keep upcoming appt in Sept for further refills Patient not taking: Reported on 05/22/2021 04/14/21   Kathie Dike, MD  predniSONE (DELTASONE) 10 MG tablet Take 30mg  po daily until 04/18/21, then decrease dose by 5mg  every week until complete Patient not taking: Reported on 05/22/2021 04/14/21   Kathie Dike, MD  thiamine 100 MG tablet Take 1 tablet (100 mg total) by mouth daily. Patient not taking: Reported on 05/22/2021 04/14/21   Kathie Dike, MD      Allergies    Patient has no known allergies.    Review of Systems   Review of Systems  Constitutional:  Positive for diaphoresis. Negative for chills and fever.  HENT:  Negative for ear pain and sore throat.   Eyes:  Negative for pain and visual disturbance.  Respiratory:  Positive for shortness of breath. Negative for cough.   Cardiovascular:  Positive for palpitations. Negative for chest pain.  Gastrointestinal:  Positive for diarrhea. Negative for abdominal pain and vomiting.  Genitourinary:  Negative for dysuria and hematuria.  Musculoskeletal:  Negative for arthralgias and back pain.  Skin:  Negative for color change and rash.  Neurological:  Positive for dizziness and light-headedness. Negative for seizures and syncope.  All other systems  reviewed and are negative.  Physical Exam Updated Vital Signs BP 115/60   Pulse 83   Temp 98 F (36.7 C) (Oral)   Resp 15   Ht 5\' 9"  (1.753 m)   Wt 106.6 kg   SpO2 98%   BMI 34.70 kg/m  Physical Exam Vitals and nursing note reviewed.  Constitutional:      General: He is in acute distress.     Appearance: He is well-developed. He is diaphoretic.  HENT:     Head: Normocephalic and atraumatic.  Eyes:      Conjunctiva/sclera: Conjunctivae normal.  Cardiovascular:     Rate and Rhythm: Tachycardia present. Rhythm irregular.     Heart sounds: No murmur heard. Pulmonary:     Effort: Pulmonary effort is normal. Tachypnea present. No respiratory distress.     Breath sounds: Normal breath sounds.  Abdominal:     Palpations: Abdomen is soft.     Tenderness: There is no abdominal tenderness.  Musculoskeletal:        General: No swelling.     Cervical back: Neck supple.  Skin:    General: Skin is warm.     Capillary Refill: Capillary refill takes less than 2 seconds.  Neurological:     Mental Status: He is alert.  Psychiatric:        Mood and Affect: Mood normal.    ED Results / Procedures / Treatments   Labs (all labs ordered are listed, but only abnormal results are displayed) Labs Reviewed  ETHANOL - Abnormal; Notable for the following components:      Result Value   Alcohol, Ethyl (B) 413 (*)    All other components within normal limits  CBC WITH DIFFERENTIAL/PLATELET - Abnormal; Notable for the following components:   Platelets 67 (*)    All other components within normal limits  BASIC METABOLIC PANEL - Abnormal; Notable for the following components:   Glucose, Bld 138 (*)    Calcium 8.0 (*)    All other components within normal limits  PROTIME-INR  APTT  DIGOXIN LEVEL  RAPID URINE DRUG SCREEN, HOSP PERFORMED  TROPONIN I (HIGH SENSITIVITY)  TROPONIN I (HIGH SENSITIVITY)    EKG None  Radiology DG Chest Portable 1 View  Result Date: 10/23/2021 CLINICAL DATA:  Shortness of breath, palpitations EXAM: PORTABLE CHEST 1 VIEW COMPARISON:  04/09/2021 FINDINGS: Lungs are clear.  No pleural effusion or pneumothorax. The heart is normal in size. IMPRESSION: No evidence of acute cardiopulmonary disease. Electronically Signed   By: Julian Hy M.D.   On: 10/23/2021 19:52    Procedures .Critical Care Performed by: Lianne Cure, DO Authorized by: Lianne Cure, DO    Critical care provider statement:    Critical care time (minutes):  75   Critical care was necessary to treat or prevent imminent or life-threatening deterioration of the following conditions: A-fib with RVR.   Critical care was time spent personally by me on the following activities:  Development of treatment plan with patient or surrogate, discussions with consultants, evaluation of patient's response to treatment, examination of patient, ordering and review of laboratory studies, ordering and review of radiographic studies, ordering and performing treatments and interventions, pulse oximetry, re-evaluation of patient's condition and review of old charts   Care discussed with comment:  Admitting team and cardiologist    Medications Ordered in ED Medications  LORazepam (ATIVAN) tablet 1-4 mg (1 mg Oral Given 10/23/21 1957)    Or  LORazepam (ATIVAN) injection 1-4 mg (  Intravenous See Alternative 10/23/21 1957)  thiamine tablet 100 mg (100 mg Oral Given 10/23/21 1939)    Or  thiamine (B-1) injection 100 mg ( Intravenous See Alternative [id number])  folic acid (FOLVITE) tablet 1 mg (1 mg Oral Given 10/23/21 1940)  multivitamin with minerals tablet 1 tablet (1 tablet Oral Given 10/23/21 1939)  diltiazem (CARDIZEM) 1 mg/mL load via infusion 20 mg (20 mg Intravenous Bolus from Bag 10/23/21 1951)    And  diltiazem (CARDIZEM) 125 mg in dextrose 5% 125 mL (1 mg/mL) infusion (5 mg/hr Intravenous New Bag/Given 10/23/21 1950)  metoprolol succinate (TOPROL-XL) 24 hr tablet 100 mg (100 mg Oral Given 10/23/21 2113)  sodium chloride 0.9 % bolus 1,000 mL (0 mLs Intravenous Stopped 10/23/21 2035)    ED Course/ Medical Decision Making/ A&P                           Medical Decision Making Amount and/or Complexity of Data Reviewed Labs: ordered. Radiology: ordered.  Risk OTC drugs. Prescription drug management. Decision regarding hospitalization.   31 year old male with past medical history of atrial  fibrillation on digoxin and metoprolol presenting for complaints of acute onset shortness of breath, dizziness, palpitations, nausea, vomiting that started yesterday and is worsening.  Patient presenting in A-fib with rapid ventricular response with a rate of 161 bpm, tachypneic at 23 breaths/min, diaphoretic, and ill-appearing.  IV access achieved.  Cardizem drip and bolus started.  Digoxin level sent.  Rate improving currently 96 bpm.  Diaphoresis improved.  No active vomiting.  I spoke with cardiologist Dr. Marion Downer who recommends admission with cards on consult.  Recommends giving it nightly metoprolol dose and slowly dosing down Cardizem throughout the night.  TTS consult placed for depression without suicidal ideation as requested by patient.  Ethanol level 413.  CIWA protocol with nursing driven Ativan orders placed for when patient starts withdrawing.  Spoke with hospitalist who agrees to except patient.          Final Clinical Impression(s) / ED Diagnoses Final diagnoses:  Atrial fibrillation with RVR ([place])  Alcoholic intoxication without complication ([place])  Alcohol dependence with unspecified alcohol-induced disorder ([place])  Depression, unspecified depression type    Rx / DC Orders ED Discharge Orders     None         Lianne Cure, DO [id number]

## 2021-10-23 NOTE — Progress Notes (Signed)
CSW  gave Pt resources for clinics/ Substance

## 2021-10-23 NOTE — ED Notes (Signed)
Pt eating chips. 

## 2021-10-23 NOTE — ED Notes (Signed)
Pt requesting something for lower back pain. Provider made aware

## 2021-10-23 NOTE — ED Notes (Signed)
X-ray at bedside

## 2021-10-23 NOTE — ED Notes (Signed)
Critical Lab: ETOH 413.  Provider notified.

## 2021-10-23 NOTE — H&P (Signed)
History and Physical    Patient: Omar Mccarthy DXA:[id number] DOB: 12-Apr-1991 DOA: 10/23/2021 DOS: the patient was seen and examined on 10/24/2021 PCP: Pcp, No  Patient coming from: Home  Chief Complaint:  Chief Complaint  Patient presents with   Shortness of Breath        Palpitations   HPI: Omar Mccarthy is a 31 y.o. male with medical history significant for atrial fibrillation in the setting of alcohol intoxication, alcohol abuse and intoxication, depression and anxiety, respiratory failure requiring intubation who presents to the emergency department due to 1 day onset of palpitations, nausea, vomiting and associated shortness of breath.  Palpitations was persistent and worsening.  Patient states that he has been compliant with his medications, though occasionally, he misses a dose of digoxin about once a week.  Patient also complaining of nonbloody diarrhea and he wanted to be evaluated due to concern for depression without suicidal ideation.  He admitted to alcohol consumption (liquor -half a gallon of vodka in 2 days) so, he decided to go to the ED for further evaluation and management. He was admitted from 04/09/2021 to 04/14/2021 due to acute alcohol hepatitis with alcohol intoxication during which he was seen by GI and was started on prednisolone therapy and discharged on prednisone taper.  ED Course:  In the emergency department, he was hemodynamically stable.  Work-up in the ED showed normal CBC except for thrombocytopenia and normal BMP except for hyperglycemia.  Troponin x2 was negative, digoxin level 0.6, alcohol level was 413. Chest x-ray showed no evidence of acute cardiopulmonary disease Was started on IV Cardizem drip, IV hydration was provided, he was started on CIWA protocol and hospitalist was asked to admit patient for further evaluation and management.  Review of Systems: Review of systems as noted in the HPI. All other systems reviewed and are  negative.   Past Medical History:  Diagnosis Date   Atrial fibrillation [facility])    a. diagnosed in 12/2019   Hypertension    Past Surgical History:  Procedure Laterality Date   CARDIOVERSION N/A 02/24/2020   Procedure: CARDIOVERSION;  Surgeon: Jonelle Sidle, MD;  Location: [facility] ENDO SUITE;  Service: Cardiovascular;  Laterality: N/A;    Social History:  reports that he has been smoking cigarettes. He has been smoking an average of 1 pack per day. He has never used smokeless tobacco. He reports current alcohol use. He reports that he does not currently use drugs after having used the following drugs: Methamphetamines and Marijuana.   No Known Allergies  Family History  Problem Relation Age of Onset   High blood pressure Mother    High Cholesterol Mother    Colon cancer Mother    High blood pressure Father    High Cholesterol Father      Prior to Admission medications   Medication Sig Start Date End Date Taking? Authorizing Provider  Buprenorphine HCl-Naloxone HCl (SUBOXONE) 8-2 MG FILM Place under the tongue See admin instructions. 1/2 film daily   Yes [provider]  digoxin (LANOXIN) 0.125 MG tablet TAKE 1 TABLET BY MOUTH DAILY 10/15/21  Yes Branch, Dorothe Pea, MD  metoprolol succinate (TOPROL-XL) 100 MG 24 hr tablet TAKE 1 TABLET(100 MG) BY MOUTH DAILY WITH OR IMMEDIATELY FOLLOWING A MEAL Patient taking differently: Take 100 mg by mouth daily. 10/15/21  Yes Branch, Dorothe Pea, MD  pantoprazole (PROTONIX) 40 MG tablet Take 1 tablet (40 mg total) by mouth 2 (two) times daily before a meal. Pt needs  to keep upcoming appt in Sept for further refills 04/14/21  Yes Erick Blinks, MD  Multiple Vitamin (MULTIVITAMIN WITH MINERALS) TABS tablet Take 1 tablet by mouth daily. Patient not taking: Reported on 10/23/2021 04/14/21   Erick Blinks, MD    Physical Exam: BP (!) 107/59 Comment: Simultaneous filing. User may not have seen previous data.  Pulse 89 Comment:  Simultaneous filing. User may not have seen previous data.  Temp 98 F (36.7 C) (Oral)   Resp 20 Comment: Simultaneous filing. User may not have seen previous data.  Ht 5\' 9"  (1.753 m)   Wt 106.6 kg   SpO2 98% Comment: Simultaneous filing. User may not have seen previous data.  BMI 34.70 kg/m   General: 31 y.o. year-old male well developed well nourished in no acute distress.  Alert and oriented x3. HEENT: NCAT, EOMI Neck: Supple, trachea medial Cardiovascular: Irregularly irregular rate and rhythm with no rubs or gallops.  No thyromegaly or JVD noted.  No lower extremity edema. 2/4 pulses in all 4 extremities. Respiratory: Clear to auscultation with no wheezes or rales. Good inspiratory effort. Abdomen: Soft, nontender nondistended with normal bowel sounds x4 quadrants. Muskuloskeletal: No cyanosis, clubbing or edema noted bilaterally Neuro: CN II-XII intact, strength 5/5 x 4, sensation, reflexes intact Skin: No ulcerative lesions noted or rashes Psychiatry: Judgement and insight appear normal. Mood is appropriate for condition and setting          Labs on Admission:  Basic Metabolic Panel: Recent Labs  Lab 10/23/21 1934  NA 139  K 4.4  CL 105  CO2 22  GLUCOSE 138*  BUN 16  CREATININE 0.87  CALCIUM 8.0*   Liver Function Tests: No results for input(s): AST, ALT, ALKPHOS, BILITOT, PROT, ALBUMIN in the last 168 hours. No results for input(s): LIPASE, AMYLASE in the last 168 hours. No results for input(s): AMMONIA in the last 168 hours. CBC: Recent Labs  Lab 10/23/21 1934  WBC 6.7  NEUTROABS 4.5  HGB 15.6  HCT 45.4  MCV 92.7  PLT 67*   Cardiac Enzymes: No results for input(s): CKTOTAL, CKMB, CKMBINDEX, TROPONINI in the last 168 hours.  BNP (last 3 results) Recent Labs    04/09/21 2021  BNP 58.0    ProBNP (last 3 results) No results for input(s): PROBNP in the last 8760 hours.  CBG: No results for input(s): GLUCAP in the last 168 hours.  Radiological  Exams on Admission: DG Chest Portable 1 View  Result Date: 10/23/2021 CLINICAL DATA:  Shortness of breath, palpitations EXAM: PORTABLE CHEST 1 VIEW COMPARISON:  04/09/2021 FINDINGS: Lungs are clear.  No pleural effusion or pneumothorax. The heart is normal in size. IMPRESSION: No evidence of acute cardiopulmonary disease. Electronically Signed   By: Charline Bills M.D.   On: 10/23/2021 19:52    EKG: I independently viewed the EKG done and my findings are as followed: Paroxysmal atrial fibrillation with rate control  Assessment/Plan Present on Admission:  Paroxysmal atrial fibrillation with RVR (HCC)  Nausea and vomiting  History of alcohol abuse  HFrEF (heart failure with reduced ejection fraction) (HCC)  Thrombocytopenia (HCC)  Hyperglycemia  Principal Problem:   Paroxysmal atrial fibrillation with RVR (HCC) Active Problems:   HFrEF (heart failure with reduced ejection fraction) (HCC)   Hyperglycemia   Thrombocytopenia (HCC)   Nausea and vomiting   History of alcohol abuse   Diarrhea   Mood disorder (HCC)   Hypertrophic cardiomyopathy (HCC)   Obesity (BMI 30-39.9)   Tobacco abuse  Paroxysmal atrial fibrillation with RVR Patient was started on IV Cardizem drip with conversion to A-fib with rate control Cardiology was consulted and recommended admitting patient with plan for cardiology to see patient in the morning per ED physician Digoxin level was low at 0.6, indicating possible poor compliance with medication Continue Toprol-XL Continue Lovenox with plan to transition patient to oral DOAC in the morning if patient continues to be in A-fib  Nausea, vomiting, diarrhea Continue Zofran as needed C. difficile and GI panel will be checked  Thrombocytopenia possibly due to myelosuppression from alcohol Platelets 138, continue to monitor platelet levels  Hyperglycemia possibly reactive  Patient was on prednisone at one time CBG 138, continue to monitor blood glucose level  with morning labs  Alcohol intoxication Alcohol level was 413 Continue CIWA protocol, thiamine and multivitamin Continue fall precaution and neuro checks Patient will be counseled on alcohol withdrawal cessation when more stable  Presumed mood disorder Patient requested for psychiatrist to be evaluated for depression without suicidal ideation (he does not have an official diagnosis of depression) TTS was consulted by ED physician  Chronic HFrEF LVEF done on 04/10/2021 was 70-75%.  No RWMA.  Positive moderate asymmetric LVH of the basal septal segment. Prior echocardiogram on 8/21 showed LVEF of 40% Continue total input/output, daily weights and fluid restriction Continue Cardiac diet   Hypertrophic cardiomyopathy Patient will continue to follow up with cardiology  Tobacco abuse Patient counseled on tobacco abuse cessation Nicotine patch provided  Obesity (BMI 34.70) Diet and lifestyle medication  DVT prophylaxis: Lovenox, SCDs  Code Status: Full code  Consults: Cardiology  Family Communication: Wife at bedside (all questions answered to satisfaction)  Severity of Illness: The appropriate patient status for this patient is INPATIENT. Inpatient status is judged to be reasonable and necessary in order to provide the required intensity of service to ensure the patient's safety. The patient's presenting symptoms, physical exam findings, and initial radiographic and laboratory data in the context of their chronic comorbidities is felt to place them at high risk for further clinical deterioration. Furthermore, it is not anticipated that the patient will be medically stable for discharge from the hospital within 2 midnights of admission.   * I certify that at the point of admission it is my clinical judgment that the patient will require inpatient hospital care spanning beyond 2 midnights from the point of admission due to high intensity of service, high risk for further deterioration  and high frequency of surveillance required.*  Author: Bernadette Hoit, DO 10/24/2021 1:30 AM  For on call review [email or web address].

## 2021-10-23 NOTE — ED Notes (Signed)
ED Provider at bedside. 

## 2021-10-23 NOTE — ED Triage Notes (Signed)
Pt c/o of SOB, dizziness, and palpitations that started yesterday and has gotten progressively worse. States he has hx of a-fib and had to have a cardioversion 2 years ago.  Pt is compliant with medication.

## 2021-10-23 NOTE — ED Notes (Signed)
EDP made aware of ativan given based on CIWA scale

## 2021-10-24 ENCOUNTER — Inpatient Hospital Stay (HOSPITAL_COMMUNITY): Payer: Self-pay

## 2021-10-24 ENCOUNTER — Other Ambulatory Visit (HOSPITAL_COMMUNITY): Payer: Self-pay | Admitting: *Deleted

## 2021-10-24 DIAGNOSIS — I422 Other hypertrophic cardiomyopathy: Secondary | ICD-10-CM

## 2021-10-24 DIAGNOSIS — D696 Thrombocytopenia, unspecified: Secondary | ICD-10-CM

## 2021-10-24 DIAGNOSIS — F39 Unspecified mood [affective] disorder: Secondary | ICD-10-CM

## 2021-10-24 DIAGNOSIS — I480 Paroxysmal atrial fibrillation: Secondary | ICD-10-CM

## 2021-10-24 DIAGNOSIS — R197 Diarrhea, unspecified: Secondary | ICD-10-CM

## 2021-10-24 DIAGNOSIS — E669 Obesity, unspecified: Secondary | ICD-10-CM

## 2021-10-24 DIAGNOSIS — I4891 Unspecified atrial fibrillation: Secondary | ICD-10-CM

## 2021-10-24 DIAGNOSIS — F1010 Alcohol abuse, uncomplicated: Secondary | ICD-10-CM

## 2021-10-24 DIAGNOSIS — I10 Essential (primary) hypertension: Secondary | ICD-10-CM

## 2021-10-24 DIAGNOSIS — Z720 Tobacco use: Secondary | ICD-10-CM

## 2021-10-24 LAB — COMPREHENSIVE METABOLIC PANEL
ALT: 57 U/L — ABNORMAL HIGH (ref 0–44)
AST: 102 U/L — ABNORMAL HIGH (ref 15–41)
Albumin: 3.6 g/dL (ref 3.5–5.0)
Alkaline Phosphatase: 84 U/L (ref 38–126)
Anion gap: 13 (ref 5–15)
BUN: 17 mg/dL (ref 6–20)
CO2: 20 mmol/L — ABNORMAL LOW (ref 22–32)
Calcium: 8.2 mg/dL — ABNORMAL LOW (ref 8.9–10.3)
Chloride: 107 mmol/L (ref 98–111)
Creatinine, Ser: 0.74 mg/dL (ref 0.61–1.24)
GFR, Estimated: >60 mL/min (ref >60)
Glucose, Bld: 143 mg/dL — ABNORMAL HIGH (ref 70–99)
Potassium: 3.5 mmol/L (ref 3.5–5.1)
Sodium: 140 mmol/L (ref 135–145)
Total Bilirubin: 0.9 mg/dL (ref 0.3–1.2)
Total Protein: 6.7 g/dL (ref 6.5–8.1)

## 2021-10-24 LAB — C DIFFICILE QUICK SCREEN W PCR REFLEX
C Diff antigen: NEGATIVE
C Diff interpretation: NOT DETECTED
C Diff toxin: NEGATIVE

## 2021-10-24 LAB — CBC
HCT: 41.4 % (ref 39.0–52.0)
Hemoglobin: 14.2 g/dL (ref 13.0–17.0)
MCH: 31.7 pg (ref 26.0–34.0)
MCHC: 34.3 g/dL (ref 30.0–36.0)
MCV: 92.4 fL (ref 80.0–100.0)
Platelets: 66 10*3/uL — ABNORMAL LOW (ref 150–400)
RBC: 4.48 MIL/uL (ref 4.22–5.81)
RDW: 13 % (ref 11.5–15.5)
WBC: 6.1 10*3/uL (ref 4.0–10.5)
nRBC: 0 % (ref 0.0–0.2)

## 2021-10-24 LAB — RAPID URINE DRUG SCREEN, HOSP PERFORMED
Amphetamines: NOT DETECTED
Barbiturates: NOT DETECTED
Benzodiazepines: NOT DETECTED
Cocaine: NOT DETECTED
Opiates: NOT DETECTED
Tetrahydrocannabinol: NOT DETECTED

## 2021-10-24 LAB — ECHOCARDIOGRAM LIMITED
Height: 69 in
S' Lateral: 2.8 cm
Weight: 3675.51 oz

## 2021-10-24 LAB — MRSA NEXT GEN BY PCR, NASAL: MRSA by PCR Next Gen: NOT DETECTED

## 2021-10-24 LAB — MAGNESIUM: Magnesium: 2 mg/dL (ref 1.7–2.4)

## 2021-10-24 LAB — PHOSPHORUS: Phosphorus: 3.3 mg/dL (ref 2.5–4.6)

## 2021-10-24 MED ORDER — ENOXAPARIN SODIUM 120 MG/0.8ML IJ SOSY
1.0000 mg/kg | PREFILLED_SYRINGE | INTRAMUSCULAR | Status: AC
Start: 2021-10-24 — End: 2021-10-24
  Administered 2021-10-24: 108 mg via SUBCUTANEOUS
  Filled 2021-10-24: qty 0.8

## 2021-10-24 MED ORDER — ONDANSETRON HCL 4 MG/2ML IJ SOLN
4.0000 mg | Freq: Four times a day (QID) | INTRAMUSCULAR | Status: DC | PRN
  Administered 2021-10-24 – 2021-10-25 (×5): 4 mg via INTRAVENOUS
  Filled 2021-10-24 (×5): qty 2

## 2021-10-24 MED ORDER — APIXABAN 5 MG PO TABS
5.0000 mg | ORAL_TABLET | Freq: Two times a day (BID) | ORAL | Status: DC
  Administered 2021-10-24 – 2021-10-25 (×3): 5 mg via ORAL
  Filled 2021-10-24 (×3): qty 1

## 2021-10-24 MED ORDER — POTASSIUM CHLORIDE CRYS ER 20 MEQ PO TBCR
40.0000 meq | EXTENDED_RELEASE_TABLET | Freq: Once | ORAL | Status: AC
Start: 2021-10-24 — End: 2021-10-24
  Administered 2021-10-24: 40 meq via ORAL
  Filled 2021-10-24: qty 2

## 2021-10-24 MED ORDER — SODIUM CHLORIDE 0.9 % IV BOLUS
500.0000 mL | Freq: Once | INTRAVENOUS | Status: AC
  Administered 2021-10-24: 500 mL via INTRAVENOUS

## 2021-10-24 MED ORDER — AMIODARONE HCL IN DEXTROSE 360-4.14 MG/200ML-% IV SOLN
30.0000 mg/h | INTRAVENOUS | Status: DC
  Filled 2021-10-24: qty 200

## 2021-10-24 MED ORDER — DIGOXIN 125 MCG PO TABS
125.0000 ug | ORAL_TABLET | Freq: Every day | ORAL | Status: DC
  Administered 2021-10-24 – 2021-10-25 (×2): 125 ug via ORAL
  Filled 2021-10-24 (×2): qty 1

## 2021-10-24 MED ORDER — AMIODARONE HCL IN DEXTROSE 360-4.14 MG/200ML-% IV SOLN
60.0000 mg/h | INTRAVENOUS | Status: AC
  Administered 2021-10-24: 60 mg/h via INTRAVENOUS

## 2021-10-24 MED ORDER — SODIUM CHLORIDE 0.9 % IV SOLN: INTRAVENOUS | Status: DC

## 2021-10-24 MED ORDER — CHLORHEXIDINE GLUCONATE CLOTH 2 % EX PADS
6.0000 | MEDICATED_PAD | Freq: Every day | CUTANEOUS | Status: DC
  Administered 2021-10-24 – 2021-10-25 (×2): 6 via TOPICAL

## 2021-10-24 MED ORDER — DILTIAZEM HCL 30 MG PO TABS
30.0000 mg | ORAL_TABLET | Freq: Three times a day (TID) | ORAL | Status: DC
  Administered 2021-10-24 – 2021-10-25 (×4): 30 mg via ORAL
  Filled 2021-10-24 (×4): qty 1

## 2021-10-24 MED ORDER — AMIODARONE HCL IN DEXTROSE 360-4.14 MG/200ML-% IV SOLN
INTRAVENOUS | Status: AC
  Administered 2021-10-24: 60 mg/h via INTRAVENOUS
  Filled 2021-10-24: qty 200

## 2021-10-24 MED ORDER — PROCHLORPERAZINE 25 MG RE SUPP
25.0000 mg | Freq: Once | RECTAL | Status: AC
Start: 2021-10-24 — End: 2021-10-24
  Administered 2021-10-24: 25 mg via RECTAL
  Filled 2021-10-24: qty 1

## 2021-10-24 MED ORDER — DIAZEPAM 5 MG PO TABS
5.0000 mg | ORAL_TABLET | Freq: Three times a day (TID) | ORAL | Status: DC
  Administered 2021-10-24 – 2021-10-25 (×3): 5 mg via ORAL
  Filled 2021-10-24 (×3): qty 1

## 2021-10-24 MED ORDER — ENOXAPARIN SODIUM 120 MG/0.8ML IJ SOSY: 1.0000 mg/kg | PREFILLED_SYRINGE | Freq: Two times a day (BID) | INTRAMUSCULAR | Status: DC

## 2021-10-24 NOTE — Consult Note (Addendum)
Cardiology Consultation:   Patient ID: Omar Mccarthy MRN: [id number]; DOB: Sep 23, 1990  Admit date: 10/23/2021 Date of Consult: 10/24/2021  PCP:  Merryl Hacker No   [facility] Providers Cardiologist:  Carlyle Dolly, MD        Patient Profile:   Omar Mccarthy is a 31 y.o. 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 Josephine Cables.  History of Present Illness:   Omar Mccarthy was last examined by Katina Dung, NP in 03/2021 and reported that he had stopped taking Eliquis due to gingival bleeding.  He was jaundiced on examination and reported heavy alcohol use, therefore he was sent directly to the emergency department for evaluation. He was admitted with acute alcoholic hepatitis in the setting of acute alcohol intoxication. He did maintain normal sinus rhythm during admission and was continued on Toprol-XL 100mg  daily and Digoxin 0.125mg  daily and was not restarted on anticoagulation given his alcohol abuse and thrombocytopenia.  He presented to [facility] ED on 10/23/2021 for evaluation of worsening palpitations which started the day prior to arrival. In talking with the patient and his spouse today, he reports he was in his normal state of health until 2-3 days prior to admission when he developed dyspnea on exertion and palpitations. Reports this resembled his prior atrial fibrillation. No specific orthopnea, PND or pitting edema. Says he sometimes misses doses of his medications (Toprol-XL and Digoxin) but has been taking regularly over the past few weeks. Has been off Eliquis for 6+ months. He reports he has resumed drinking and consumes a half-gallon of liquor every 1-2 days.   Initial labs showed WBC 6.7, Hgb 15.6,  platelets 67, Na+ 139, K+ 4.4 and creatinine 0.87. AST 102 and ALT 57. Dig Level 0.6. Initial and repeat Hs Troponin negative at 12 and 14. Ethanol 413. UDS negative. CXR with no acute cardiopulmonary disease. EKG shows atrial fibrillation with RVR, HR 164 with IVCD and diffuse TWI.   He was admitted and started on IV Cardizem for rate-control and at some point overnight this was discontinued and he was started on IV Amiodarone (possibly due to hypotension as BP was recorded at 84/46 around the time this was initiated but no notes are in the system).    Past Medical History:  Diagnosis Date   Atrial fibrillation [facility])    a. diagnosed in 12/2019   Hypertension     Past Surgical History:  Procedure Laterality Date   CARDIOVERSION N/A 02/24/2020   Procedure: CARDIOVERSION;  Surgeon: Satira Sark, MD;  Location: [facility];  Service: Cardiovascular;  Laterality: N/A;     Home Medications:  Prior to Admission medications   Medication Sig Start Date End Date Taking? Authorizing Provider  Buprenorphine HCl-Naloxone HCl (SUBOXONE) 8-2 MG FILM Place under the tongue See admin instructions. 1/2 film daily   Yes [provider]  digoxin (LANOXIN) 0.125 MG tablet TAKE 1 TABLET BY MOUTH DAILY 10/15/21  Yes Delmar Dondero, Alphonse Guild, MD  metoprolol succinate (TOPROL-XL) 100 MG 24 hr tablet TAKE 1 TABLET(100 MG) BY MOUTH DAILY WITH OR IMMEDIATELY FOLLOWING A MEAL Patient taking differently: Take 100 mg by mouth daily. 10/15/21  Yes Arnoldo Lenis, MD  pantoprazole (PROTONIX) 40 MG tablet Take 1 tablet (  40 mg total) by mouth 2 (two) times daily before a meal. Pt needs to keep upcoming appt in Sept for further refills 04/14/21  Yes Kathie Dike, MD  Multiple Vitamin (MULTIVITAMIN WITH MINERALS) TABS tablet Take 1 tablet by mouth daily. Patient not taking: Reported on 10/23/2021 04/14/21   Kathie Dike, MD    Inpatient Medications: Scheduled Meds:  Chlorhexidine Gluconate Cloth  6 each  Topical Daily   digoxin  125 mcg Oral Daily   enoxaparin (LOVENOX) injection  1 mg/kg Subcutaneous [id number]   folic acid  1 mg Oral Daily   metoprolol succinate  100 mg Oral Daily   multivitamin with minerals  1 tablet Oral Daily   nicotine  21 mg Transdermal Daily   thiamine  100 mg Oral Daily   Or   thiamine  100 mg Intravenous Daily   Continuous Infusions:  amiodarone 60 mg/hr (10/24/21 0754)   amiodarone 30 mg/hr (10/24/21 0832)   diltiazem (CARDIZEM) infusion Stopped (10/24/21 0012)   PRN Meds: acetaminophen, LORazepam **OR** LORazepam, ondansetron (ZOFRAN) IV  Allergies:   No Known Allergies  Social History:   Social History   Socioeconomic History   Marital status: Married    Spouse name: Not on file   Number of children: Not on file   Years of education: Not on file   Highest education level: Not on file  Occupational History   Not on file  Tobacco Use   Smoking status: Every Day    Packs/day: 1.00    Types: Cigarettes   Smokeless tobacco: Never  Vaping Use   Vaping Use: Never used  Substance and Sexual Activity   Alcohol use: Yes    Comment: pt states "I drink 1 gallon of vodka every 2 days"   Drug use: Not Currently    Types: Methamphetamines, Marijuana    Comment: Prior marijuana.  intermittent use of vicodin (not prescribed). Meth 01/2021   Sexual activity: Yes  Other Topics Concern   Not on file  Social History Narrative   Not on file   Social Determinants of Health   Financial Resource Strain: Not on file  Food Insecurity: Not on file  Transportation Needs: Not on file  Physical Activity: Not on file  Stress: Not on file  Social Connections: Not on file  Intimate Partner Violence: Not on file    Family History:    Family History  Problem Relation Age of Onset   High blood pressure Mother    High Cholesterol Mother    Colon cancer Mother    High blood pressure Father    High Cholesterol Father      ROS:  Please see the history of  present illness.   All other ROS reviewed and negative.     Physical Exam/Data:   Vitals:   10/24/21 0400 10/24/21 0500 10/24/21 0700 10/24/21 0800  BP: (!) 109/50 (!) 106/49 (!) 110/56 118/66  Pulse: 90 90 85 91  Resp: 16 15 16 12   Temp:      TempSrc:      SpO2: 93% 94% 95% 98%  Weight:  104.2 kg    Height:        Intake/Output Summary (Last 24 hours) at 10/24/2021 0833 Last data filed at 10/23/2021 2035 Gross per 24 hour  Intake 1000 ml  Output --  Net 1000 ml      10/24/2021    5:00 AM 10/23/2021    7:23 PM 05/22/2021   11:30 AM  Last 3  Weights  Weight (lbs) 229 lb 11.5 oz 235 lb 227 lb 9.6 oz  Weight (kg) 104.2 kg 106.595 kg 103.239 kg     Body mass index is 33.92 kg/m.  General:  Well nourished, well developed male appearing in no acute distress. HEENT: normal Neck: no JVD Vascular: No carotid bruits; Distal pulses 2+ bilaterally Cardiac:  normal S1, S2; Irregularly irregular.  Lungs:  clear to auscultation bilaterally, no wheezing, rhonchi or rales  Abd: soft, nontender, no hepatomegaly  Ext: no pitting edema Musculoskeletal:  No deformities, BUE and BLE strength normal and equal Skin: warm and dry  Neuro:  CNs 2-12 intact, no focal abnormalities noted Psych:  Normal affect   EKG:  The EKG was personally reviewed and demonstrates: Atrial fibrillation with RVR, HR 164 with IVCD and diffuse TWI.  Telemetry:  Telemetry was personally reviewed and demonstrates: Atrial fibrillation, HR in 80's to low-100's.   Relevant CV Studies:  Echo: 03/2021 IMPRESSIONS     1. Left ventricular ejection fraction, by estimation, is 70 to 75%. The  left ventricle has hyperdynamic function. The left ventricle has no  regional wall motion abnormalities. There is moderate asymmetric left  ventricular hypertrophy of the basal septal   segment. Left ventricular diastolic parameters were normal. Elevated left  atrial pressure. Mitral chordal SAM is noted with increased LVOT  gradient  of 60-100 mmHg. This is consistent with outflow obstruction and more of a  hypertrophic cardiomyopathy  picture than the prior study.   2. Right ventricular systolic function is normal. The right ventricular  size is normal. There is normal pulmonary artery systolic pressure. The  estimated right ventricular systolic pressure is [id number] mmHg.   3. Left atrial size was moderately dilated.   4. The mitral valve is abnormal. Moderate mitral valve regurgitation.   5. The aortic valve is tricuspid. Aortic valve regurgitation is not  visualized.   6. The inferior vena cava is normal in size with greater than 50%  respiratory variability, suggesting right atrial pressure of 3 mmHg.   Comparison(s): Prior images reviewed side by side. LVEF is now vigorous  and more consistent with a hypertrophic cardiomyopathy picture than  dilated cardiomyopathy.   Laboratory Data:  High Sensitivity Troponin:   Recent Labs  Lab 10/23/21 1934 10/23/21 2119  TROPONINIHS 12 14     Chemistry Recent Labs  Lab 10/23/21 1934 10/24/21 0337  NA 139 140  K 4.4 3.5  CL 105 107  CO2 22 20*  GLUCOSE 138* 143*  BUN 16 17  CREATININE 0.87 0.74  CALCIUM 8.0* 8.2*  MG  --  2.0  GFRNONAA >60 >60  ANIONGAP 12 13    Recent Labs  Lab 10/24/21 0337  PROT 6.7  ALBUMIN 3.6  AST 102*  ALT 57*  ALKPHOS 84  BILITOT 0.9   Lipids No results for input(s): CHOL, TRIG, HDL, LABVLDL, LDLCALC, CHOLHDL in the last 168 hours.  Hematology Recent Labs  Lab 10/23/21 1934 10/24/21 0337  WBC 6.7 6.1  RBC 4.90 4.48  HGB 15.6 14.2  HCT 45.4 41.4  MCV 92.7 92.4  MCH 31.8 31.7  MCHC 34.4 34.3  RDW 13.0 13.0  PLT 67* 66*   Thyroid No results for input(s): TSH, FREET4 in the last 168 hours.  BNPNo results for input(s): BNP, PROBNP in the last 168 hours.  DDimer No results for input(s): DDIMER in the last 168 hours.   Radiology/Studies:  DG Chest Portable 1 View  Result Date: 10/23/2021 CLINICAL  DATA:   Shortness of breath, palpitations EXAM: PORTABLE CHEST 1 VIEW COMPARISON:  04/09/2021 FINDINGS: Lungs are clear.  No pleural effusion or pneumothorax. The heart is normal in size. IMPRESSION: No evidence of acute cardiopulmonary disease. Electronically Signed   By: Julian Hy M.D.   On: 10/23/2021 19:52     Assessment and Plan:   1. Atrial Fibrillation with RVR - He has a history of persistent atrial fibrillation and underwent DCCV in 01/2020 and this is his first known recurrence since. He has resumed consuming a significant amount of alcohol and suspect this is the likely culprit of his arrhythmia.  - He was missing doses of his medications as an outpatient and has been off Eliquis for 6+ months. He was initially started on IV Cardizem but developed hypotension with this and was started on IV Amiodarone overnight. While rates are controlled with this, Amiodarone is not an ideal medication for the patient given his young age and elevated LFT's. Will review with Dr. Harl Bowie but likely discontinue. Continue PTA Toprol-XL 100mg  daily and Digoxin 0.125mg  daily. He previously required a combination of Toprol-XL 100mg  daily and Cardizem CD 360mg  daily in 2021 for rate-control, so pending HR/BP response we may need to add back PO Cardizem. Can consider repeat DCCV following 3 weeks of uninterrupted anticoagulation but he is at high-risk for recurrence given his alcohol use.  - On full-dose Lovenox currently. Will ask Pharmacy to switch this to Eliquis.   2. HFimpEF  - EF 40% in 12/2019 in the setting of atrial fibrillation with RVR, normalized to 70-75% in 03/2021. Most recent echo mentioned concern for hypertrophic cardiomyopathy given increased LVOT gradient of 60-100 mmHg. Will obtain a limited echo for reassessment of his EF and gradient.   3. HTN - BP well-controlled at 118/66 on most recent check. Continue PTA Toprol-XL and Digoxin.    4. Thrombocytopenia - No reports of active bleeding.  Platelets at 66 K today.   5. Alcohol Abuse/Elevated LFT's - AST 102 and ALT 57 this AM. Ethanol at 413 on admission. He was consuming 1/2 gallon of liquor every 1-2 days prior to admission. On CIWA.     Risk Assessment/Risk Scores:    CHA2DS2-VASc Score = 2   This indicates a 2.2% annual risk of stroke. The patient's score is based upon: CHF History: 1 HTN History: 1 Diabetes History: 0 Stroke History: 0 Vascular Disease History: 0 Age Score: 0 Gender Score: 0    For questions or updates, please contact [place] Please consult [email or web address] for contact info under    Signed, Omar Heritage, PA-C  10/24/2021 8:33 AM   Patient seen and discussed with PA Omar Mccarthy, I agree with her documentation. 31 yo male history of afib diagnosed initially during 12/2019 in setting of severe EtoH withdrawal and sepsis. Difficultly with rate control, he had outpatient DCCV 02/24/20.  History of EtoH abuse, thrombocytopenia, prior admission with acute EtoH hepatitis. Admitted with palpitations,N/V, SOB.      ER vitals: p 118 bp 130/91 98%  WBC 11.8 Hgb 12.1 Plt 67 K 4.4 Cr 0.87 BUN 16 Dix 0.6 EtOH 413 UDS neg AST 102 ALT 57 Trop 12-->14 CXR no acute process EKG afib RVR  03/2021 echo LVE 70-75%< no WMAs, normal diastolic, chordial SAM with LVOT gradient   History of PAF as reported above. Presents with afib with RVR in setting of acute EtoH intoxication. Initially stated on dilt gtt, low bp's and transitoned to IV amiodarone by primary team.  With liver disease, poor compliance,  and chronic EtoH use and unstable electrolytes poor candidate for any antiarrhythmic. Would plan for rate control, could support bp with midodrine if neccesary. If compliant with anticoag over time could reconsider DCCV as outpatient similar to 2021. Will d/c IV amio, if recurrent tachy and soft bp's could load with IV digoxin 0.25mg  IV every 6 hours x 2 doses. Will add oral dilt 30mg  tid with hold parameters.  Reasonable chance he may have some withdrawal similar to 2021 which may make rate control more challening over the next day or two. Recheck echo reassses LVOT gradient.      Carlyle Dolly MD

## 2021-10-24 NOTE — TOC Progression Note (Signed)
Transition of Care [facility]) - Progression Note    Patient Details  Name: Omar Mccarthy MRN: [id number] Date of Birth: 1991-04-27  Transition of [facility] CM/SW Contact  Karn Cassis, [place] Phone Number: 10/24/2021, 9:39 AM  Clinical Narrative: Pt admitted due to atrial fibrillation with RVR. TOC consulted for substance abuse counseling. Discussed outpatient substance abuse resources and pt accepting of list. LCSW noted pt does not have insurance or PCP. Pt agreeable to [facility] referral. LCSW made referral to [facility].           Expected Discharge Plan and Services                                                 Social Determinants of Health (SDOH) Interventions Alcohol Brief Interventions/Follow-up: Alcohol education/Brief advice Depression Interventions/Treatment : Referral to Psychiatry  Readmission Risk Interventions    04/12/2021   11:06 AM  Readmission Risk Prevention Plan  Transportation Screening Complete  Home Care Screening Complete  Medication Review (RN CM) Complete

## 2021-10-24 NOTE — Progress Notes (Signed)
ANTICOAGULATION CONSULT NOTE - Initial Consult  Pharmacy Consult for Lovenox Indication: atrial fibrillation  No Known Allergies  Patient Measurements: Height: 5\' 9"  (175.3 cm) Weight: 106.6 kg (235 lb) IBW/kg (Calculated) : 70.7  Vital Signs: Temp: 98 F (36.7 C) (05/31 0037) Temp Source: Oral (05/31 0037) BP: 113/60 (05/31 0100) Pulse Rate: 92 (05/31 0100)  Labs: Recent Labs    10/23/21 1934 10/23/21 2119  HGB 15.6  --   HCT 45.4  --   PLT 67*  --   APTT 33  --   LABPROT 14.2  --   INR 1.1  --   CREATININE 0.87  --   TROPONINIHS 12 14    Estimated Creatinine Clearance: 148.1 mL/min (by C-G formula based on SCr of 0.87 mg/dL).   Medical History: Past Medical History:  Diagnosis Date   Atrial fibrillation ([facility])    a. diagnosed in 12/2019   Hypertension     Medications:  Medications Prior to Admission  Medication Sig Dispense Refill Last Dose   Buprenorphine HCl-Naloxone HCl (SUBOXONE) 8-2 MG FILM Place under the tongue See admin instructions. 1/2 film daily   10/23/2021   digoxin (LANOXIN) 0.125 MG tablet TAKE 1 TABLET BY MOUTH DAILY 30 tablet 1 10/23/2021   metoprolol succinate (TOPROL-XL) 100 MG 24 hr tablet TAKE 1 TABLET(100 MG) BY MOUTH DAILY WITH OR IMMEDIATELY FOLLOWING A MEAL (Patient taking differently: Take 100 mg by mouth daily.) 30 tablet 1 10/23/2021 at 1400   pantoprazole (PROTONIX) 40 MG tablet Take 1 tablet (40 mg total) by mouth 2 (two) times daily before a meal. Pt needs to keep upcoming appt in Sept for further refills 60 tablet 0 10/23/2021   Multiple Vitamin (MULTIVITAMIN WITH MINERALS) TABS tablet Take 1 tablet by mouth daily. (Patient not taking: Reported on 10/23/2021) 30 tablet 2 Not Taking   Scheduled:   folic acid  1 mg Oral Daily   metoprolol succinate  100 mg Oral Daily   multivitamin with minerals  1 tablet Oral Daily   nicotine  21 mg Transdermal Daily   thiamine  100 mg Oral Daily   Or   thiamine  100 mg Intravenous Daily    Infusions:   diltiazem (CARDIZEM) infusion Stopped (10/24/21 0012)    Assessment: 30yo male c/o SOB, dizziness, and palpitations, found in Afib w/ RVR, has h/o Afib w/ cardioversion 66yr ago, now in Afib with rate control after tx w/ diltiazem, to start LMWH for now and transition to DOAC if remains in Afib.  Goal of Therapy:  Anti-Xa level 0.6-1 units/ml 4hrs after LMWH dose given Monitor platelets by anticoagulation protocol: Yes   Plan:  Lovenox 1mg /kg Q12H. Monitor CBC.  0yr, PharmD, BCPS  10/24/2021,1:52 AM

## 2021-10-24 NOTE — ED Notes (Signed)
Pt is comfortably asleep with spouse in the bed

## 2021-10-24 NOTE — Progress Notes (Incomplete)
Patient seen and discussed with PA Iran Ouch, I agree with her documentation. 31 yo male history of afib diagnosed initially during 12/2019 in setting of severe EtoH withdrawal and sepsis. Difficultly with rate control, he had outpatient DCCV 02/24/20.  History of EtoH abuse, thrombocytopenia, prior admission with acute EtoH hepatitis. Admitted with palpitations,N/V, SOB.      ER vitals: p 118 bp 130/91 98%  WBC 11.8 Hgb 12.1 Plt 67 K 4.4 Cr 0.87 BUN 16 Dix 0.6 EtOH 413 UDS neg AST 102 ALT 57 Trop 12-->14 CXR no acute process EKG afib RVR  03/2021 echo LVE 70-75%< no WMAs, normal diastolic, chordial SAM with LVOT gradient   History of PAF as reported above. Presents with afib with RVR in setting of acute EtoH intoxication. Initially stated on dilt gtt, low bp's and transitoned to IV amiodarone by primary team. With liver disease, poor compliance,  and chronic EtoH use and unstable electrolytes poor candidate for any antiarrhythmic. Would plan for rate control, could support bp with midodrine if neccesary. If compliant with anticoag over time could reconsider DCCV as outpatient similar to 2021. Will d/c IV amio, if recurrent tachy and soft bp's could load with IV digoxin 0.25mg  IV every 6 hours x 2 doses. Will add oral dilt 30mg  tid with hold parameters. Reasonable chance he may have some withdrawal similar to 2021 which may make rate control more challening over the next day or two.     2022 MD

## 2021-10-24 NOTE — Progress Notes (Signed)
ANTICOAGULATION CONSULT NOTE - Initial Consult  Pharmacy Consult for Lovenox >> Eliquis Indication: atrial fibrillation  No Known Allergies  Patient Measurements: Height: 5\' 9"  (175.3 cm) Weight: 104.2 kg (229 lb 11.5 oz) IBW/kg (Calculated) : 70.7  Vital Signs: Temp: 98 F (36.7 C) (05/31 0037) Temp Source: Oral (05/31 0037) BP: 118/66 (05/31 0800) Pulse Rate: 91 (05/31 0800)  Labs: Recent Labs    10/23/21 1934 10/23/21 2119 10/24/21 0337  HGB 15.6  --  14.2  HCT 45.4  --  41.4  PLT 67*  --  66*  APTT 33  --   --   LABPROT 14.2  --   --   INR 1.1  --   --   CREATININE 0.87  --  0.74  TROPONINIHS 12 14  --      Estimated Creatinine Clearance: 159.1 mL/min (by C-G formula based on SCr of 0.74 mg/dL).   Medical History: Past Medical History:  Diagnosis Date   Atrial fibrillation ([place])    a. diagnosed in 12/2019   Hypertension     Medications:  Medications Prior to Admission  Medication Sig Dispense Refill Last Dose   Buprenorphine HCl-Naloxone HCl (SUBOXONE) 8-2 MG FILM Place under the tongue See admin instructions. 1/2 film daily   10/23/2021   digoxin (LANOXIN) 0.125 MG tablet TAKE 1 TABLET BY MOUTH DAILY 30 tablet 1 10/23/2021   metoprolol succinate (TOPROL-XL) 100 MG 24 hr tablet TAKE 1 TABLET(100 MG) BY MOUTH DAILY WITH OR IMMEDIATELY FOLLOWING A MEAL (Patient taking differently: Take 100 mg by mouth daily.) 30 tablet 1 10/23/2021 at 1400   pantoprazole (PROTONIX) 40 MG tablet Take 1 tablet (40 mg total) by mouth 2 (two) times daily before a meal. Pt needs to keep upcoming appt in Sept for further refills 60 tablet 0 10/23/2021   Multiple Vitamin (MULTIVITAMIN WITH MINERALS) TABS tablet Take 1 tablet by mouth daily. (Patient not taking: Reported on 10/23/2021) 30 tablet 2 Not Taking   Scheduled:   apixaban  5 mg Oral BID   Chlorhexidine Gluconate Cloth  6 each Topical Daily   digoxin  125 mcg Oral Daily   folic acid  1 mg Oral Daily   metoprolol succinate   100 mg Oral Daily   multivitamin with minerals  1 tablet Oral Daily   nicotine  21 mg Transdermal Daily   thiamine  100 mg Oral Daily   Or   thiamine  100 mg Intravenous Daily   Infusions:   amiodarone 60 mg/hr (10/24/21 0754)   amiodarone 30 mg/hr (10/24/21 0832)   diltiazem (CARDIZEM) infusion Stopped (10/24/21 0012)    Assessment: 31yo male c/o SOB, dizziness, and palpitations, found in Afib w/ RVR, has h/o Afib w/ cardioversion 45yr ago, now in Afib with rate control after tx w/ diltiazem, transition to DOAC   Goal of Therapy:   Monitor platelets by anticoagulation protocol: Yes   Plan:  Stop lovenox Start apixaban 5 mg twice daily at 1400. Monitor CBC.  Margot Ables, PharmD Clinical Pharmacist 10/24/2021 8:39 AM

## 2021-10-24 NOTE — Progress Notes (Signed)
PROGRESS NOTE     Omar Mccarthy, is a 31 y.o. male, DOB - Mar 09, 1991, [id number]  Admit date - 10/23/2021   Admitting Physician Bernadette Hoit, DO  Outpatient Primary MD for the patient is Pcp, No  LOS - 1  Chief Complaint  Patient presents with   Shortness of Breath        Palpitations        Brief Narrative:  31 y.o. male with medical history significant for atrial fibrillation in the setting of alcohol intoxication, alcohol abuse and intoxication, depression and anxiety admitted on 10/13/2021 with A-fib with RVR and alcohol intoxication    -Assessment and Plan: 1)Paroxysmal atrial fibrillation (diagnosed in 12/2019 with successful DCCV in 01/2020)--- in the setting of acute alcohol intoxication -BAL was over 400 on admission -Rate control will be complicated with tachycardia from DTs -Cardiology input appreciated -Echo with EF of 65 to 70% -Per cardiology team okay to discontinue IV amiodarone -Give oral Cardizem, Toprol-XL and oral digoxin,, may load with IV digoxin if tachycardia persists -May be a candidate for TEE with cardioversion -Free T1.0, TSH 3.0 -Eliquis for stroke prophylaxis  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)--repeat echo on 10/24/2021 with EF of 65 to 70% -Patient will need repeat echocardiogram to evaluate for possible hypertrophic cardiomyopathy given increased LVOT when rate controlled -BP remains soft, continue Toprol-XL for now -Continue oral digoxin - okay to stop Toprol-XL use midodrine as needed for BP support if BP becomes more soft  3) alcohol abuse with alcoholic hepatitis -Elevated LFTs noted (AST: ALT ratio is about 2 to 1--consistent with alcoholic hepatitis) -PTA patient was drinking about half a gallon of vodka on most days -10/24/21- Becoming restless, tachy, with tremors and emesis--suspect delirium tremens -Benzos as ordered (will need to be more generous with benzos) -Patient has history of severe DTs requiring intubation in the  past - 4) thrombocytopenia--suspect due to combination of myelosuppression from direct toxic alcohol effects on bone marrow and also due to underlying alcohol hepatitis -No bleeding concerns at this time -Watch closely while on Eliquis for A-fib  5) tobacco abuse--- not ready to quit okay to give nicotine patch  6)Class II Obesity- -Low calorie diet, portion control and increase physical activity discussed with patient -Body mass index is 33.92 kg/m.  Disposition/Need for in-Hospital Stay- patient unable to be discharged at this time due to -A-fib with RVR requiring further rate control, alcohol intoxication /DTs requiring IV lorazepam*  Status is: Inpatient   Disposition: The patient is from: Home              Anticipated d/c is to: Home              Anticipated d/c date is: 1 day              Patient currently is not medically stable to d/c. Barriers: Not Clinically Stable-   Code Status :  -  Code Status: Full Code   Family Communication:   Discussed with patient's wife at bedside  DVT Prophylaxis  :   - SCDs   SCDs Start: 10/23/21 2139 apixaban (ELIQUIS) tablet 5 mg   Lab Results  Component Value Date   PLT 66 (L) 10/24/2021    Inpatient Medications  Scheduled Meds:  apixaban  5 mg Oral BID   Chlorhexidine Gluconate Cloth  6 each Topical Daily   diazepam  5 mg Oral TID   digoxin  125 mcg Oral Daily   diltiazem  30 mg Oral  TID   folic acid  1 mg Oral Daily   metoprolol succinate  100 mg Oral Daily   multivitamin with minerals  1 tablet Oral Daily   nicotine  21 mg Transdermal Daily   thiamine  100 mg Oral Daily   Or   thiamine  100 mg Intravenous Daily   Continuous Infusions:  sodium chloride 100 mL/hr at 10/24/21 1309   PRN Meds:.acetaminophen, LORazepam **OR** LORazepam, ondansetron (ZOFRAN) IV   Anti-infectives (From admission, onward)    None        Subjective: Omar Mccarthy today has no fevers,    No chest pain,    -Patient was seen in  conjunction with Dr. Jani Gravel-- - -Patient's wife is at bedside Becoming restless, tachy, with tremors and emesis--suspect delirium tremens  Objective: Vitals:   10/24/21 1300 10/24/21 1400 10/24/21 1500 10/24/21 1639  BP: 126/68 (!) 145/93 (!) 136/93   Pulse: 89 (!) 104 (!) 104   Resp:  16 18   Temp:    97.6 F (36.4 C)  TempSrc:    Oral  SpO2:  97% 98%   Weight:      Height:        Intake/Output Summary (Last 24 hours) at 10/24/2021 1741 Last data filed at 10/24/2021 1309 Gross per 24 hour  Intake 1618.55 ml  Output --  Net 1618.55 ml   Filed Weights   10/23/21 1923 10/24/21 0500  Weight: 106.6 kg 104.2 kg    Physical Exam  Gen:- Awake Alert, a bit more restless HEENT:- [place].AT, No sclera icterus Neck-Supple Neck,No JVD,.  Lungs-  CTAB , fair symmetrical air movement CV- S1, S2 normal, regular  Abd-  +ve B.Sounds, Abd Soft, No tenderness,    Extremity/Skin:- No  edema, pedal pulses present  Psych-affect is anxious,, oriented x3 Neuro-no new focal deficits, +ve tremors  Data Reviewed: I have personally reviewed following labs and imaging studies  CBC: Recent Labs  Lab 10/23/21 1934 10/24/21 0337  WBC 6.7 6.1  NEUTROABS 4.5  --   HGB 15.6 14.2  HCT 45.4 41.4  MCV 92.7 92.4  PLT 67* 66*   Basic Metabolic Panel: Recent Labs  Lab 10/23/21 1934 10/24/21 0337  NA 139 140  K 4.4 3.5  CL 105 107  CO2 22 20*  GLUCOSE 138* 143*  BUN 16 17  CREATININE 0.87 0.74  CALCIUM 8.0* 8.2*  MG  --  2.0  PHOS  --  3.3   GFR: Estimated Creatinine Clearance: 159.1 mL/min (by C-G formula based on SCr of 0.74 mg/dL). Liver Function Tests: Recent Labs  Lab 10/24/21 0337  AST 102*  ALT 57*  ALKPHOS 84  BILITOT 0.9  PROT 6.7  ALBUMIN 3.6   Cardiac Enzymes: No results for input(s): CKTOTAL, CKMB, CKMBINDEX, TROPONINI in the last 168 hours. BNP (last 3 results) No results for input(s): PROBNP in the last 8760 hours. HbA1C: No results for input(s): HGBA1C in  the last 72 hours. Sepsis Labs: @LABRCNTIP (procalcitonin:4,lacticidven:4) ) Recent Results (from the past 240 hour(s))  MRSA Next Gen by PCR, Nasal     Status: None   Collection Time: 10/24/21  1:13 AM   Specimen: Nasal Mucosa; Nasal Swab  Result Value Ref Range Status   MRSA by PCR Next Gen NOT DETECTED NOT DETECTED Final    Comment: (NOTE) The GeneXpert MRSA Assay (FDA approved for NASAL specimens only), is one component of a comprehensive MRSA colonization surveillance program. It is not intended to diagnose MRSA infection  nor to guide or monitor treatment for MRSA infections. Test performance is not FDA approved in patients less than 55 years old. Performed at [facility], [address], [place], [place] [address]       Radiology Studies: DG Chest Portable 1 View  Result Date: 10/23/2021 CLINICAL DATA:  Shortness of breath, palpitations EXAM: PORTABLE CHEST 1 VIEW COMPARISON:  04/09/2021 FINDINGS: Lungs are clear.  No pleural effusion or pneumothorax. The heart is normal in size. IMPRESSION: No evidence of acute cardiopulmonary disease. Electronically Signed   By: Julian Hy M.D.   On: 10/23/2021 19:52   ECHOCARDIOGRAM LIMITED  Result Date: 10/24/2021    ECHOCARDIOGRAM LIMITED REPORT   Patient Name:   CARLYN MCCOLLUM Schatzman Date of Exam: 10/24/2021 Medical Rec #:  [id number]        Height:       69.0 in Accession #:    [id number]       Weight:       229.7 lb Date of Birth:  July 06, 1990        BSA:          2.191 m Patient Age:    30 years         BP:           114/61 mmHg Patient Gender: M                HR:           90 bpm. Exam [facility]:  [facility] Procedure: Limited Echo Indications:    Atrial Fibrillation  History:        Patient has prior history of Echocardiogram examinations, most                 recent 04/10/2021. CHF, Arrythmias:Tachycardia and Atrial                 Fibrillation, Signs/Symptoms:Chest Pain; Risk Factors:Current                 Smoker. ETOH and Polysubstance  abuse.  Sonographer:    Wenda Low Referring Phys: [id number] [place]  1. Right ventricular systolic function is normal. The right ventricular size is normal.  2. The aortic valve has an indeterminant number of cusps.  3.     LVEF 65-70%. Difficult evaluation of LVOT gradient based on available Dopplers and in the setting of afib with RVR. Consider repeat study when rates better controlled. FINDINGS  Left Ventricle: LVEF 65-70%. Difficult evaluation of LVOT gradient based on available Dopplers and in the setting of afib with RVR. Consider repeat study when rates better controlled. Left ventricular ejection fraction, by estimation, is 65 to 70%. The left ventricle has normal function. Right Ventricle: The right ventricular size is normal. Right vetricular wall thickness was not well visualized. Right ventricular systolic function is normal. Aortic Valve: The aortic valve has an indeterminant number of cusps. Aorta: The aortic root is normal in size and structure. LEFT VENTRICLE PLAX 2D LVIDd:         4.40 cm LVIDs:         2.80 cm LV PW:         1.20 cm LV IVS:        1.40 cm LVOT diam:     2.20 cm LVOT Area:     3.80 cm  LEFT ATRIUM         Index LA diam:    4.80 cm 2.19 cm/m   AORTA Ao  Root diam: 3.20 cm  SHUNTS Systemic Diam: 2.20 cm Carlyle Dolly MD Electronically signed by Carlyle Dolly MD Signature Date/Time: 10/24/2021/12:09:34 PM    Final      Scheduled Meds:  apixaban  5 mg Oral BID   Chlorhexidine Gluconate Cloth  6 each Topical Daily   diazepam  5 mg Oral TID   digoxin  125 mcg Oral Daily   diltiazem  30 mg Oral TID   folic acid  1 mg Oral Daily   metoprolol succinate  100 mg Oral Daily   multivitamin with minerals  1 tablet Oral Daily   nicotine  21 mg Transdermal Daily   thiamine  100 mg Oral Daily   Or   thiamine  100 mg Intravenous Daily   Continuous Infusions:  sodium chloride 100 mL/hr at 10/24/21 1309     LOS: 1 day    Roxan Hockey M.D on  10/24/2021 at 5:41 PM  Go to [email or web address] - for contact info  [facility] - Office  [phone]  If 7PM-7AM, please contact night-coverage [email or web address] Password [facility] 10/24/2021, 5:41 PM

## 2021-10-24 NOTE — Progress Notes (Signed)
*  PRELIMINARY RESULTS* Echocardiogram Limited 2D Echocardiogram has been performed.  Carolyne Fiscal 10/24/2021, 10:11 AM

## 2021-10-25 LAB — PROTIME-INR
INR: 1.3 — ABNORMAL HIGH (ref 0.8–1.2)
Prothrombin Time: 16.5 seconds — ABNORMAL HIGH (ref 11.4–15.2)

## 2021-10-25 LAB — CBC
HCT: 35.6 % — ABNORMAL LOW (ref 39.0–52.0)
Hemoglobin: 12.5 g/dL — ABNORMAL LOW (ref 13.0–17.0)
MCH: 32.4 pg (ref 26.0–34.0)
MCHC: 35.1 g/dL (ref 30.0–36.0)
MCV: 92.2 fL (ref 80.0–100.0)
Platelets: 43 10*3/uL — ABNORMAL LOW (ref 150–400)
RBC: 3.86 MIL/uL — ABNORMAL LOW (ref 4.22–5.81)
RDW: 12.8 % (ref 11.5–15.5)
WBC: 4.5 10*3/uL (ref 4.0–10.5)
nRBC: 0 % (ref 0.0–0.2)

## 2021-10-25 LAB — COMPREHENSIVE METABOLIC PANEL
ALT: 48 U/L — ABNORMAL HIGH (ref 0–44)
AST: 77 U/L — ABNORMAL HIGH (ref 15–41)
Albumin: 2.9 g/dL — ABNORMAL LOW (ref 3.5–5.0)
Alkaline Phosphatase: 66 U/L (ref 38–126)
Anion gap: 5 (ref 5–15)
BUN: 7 mg/dL (ref 6–20)
CO2: 24 mmol/L (ref 22–32)
Calcium: 7.1 mg/dL — ABNORMAL LOW (ref 8.9–10.3)
Chloride: 108 mmol/L (ref 98–111)
Creatinine, Ser: 0.35 mg/dL — ABNORMAL LOW (ref 0.61–1.24)
GFR, Estimated: >60 mL/min (ref >60)
Glucose, Bld: 107 mg/dL — ABNORMAL HIGH (ref 70–99)
Potassium: 2.8 mmol/L — ABNORMAL LOW (ref 3.5–5.1)
Sodium: 137 mmol/L (ref 135–145)
Total Bilirubin: 2.1 mg/dL — ABNORMAL HIGH (ref 0.3–1.2)
Total Protein: 5.4 g/dL — ABNORMAL LOW (ref 6.5–8.1)

## 2021-10-25 LAB — GASTROINTESTINAL PANEL BY PCR, STOOL (REPLACES STOOL CULTURE)

## 2021-10-25 MED ORDER — DILTIAZEM HCL 60 MG PO TABS: 60.0000 mg | ORAL_TABLET | Freq: Two times a day (BID) | ORAL | Status: DC

## 2021-10-25 MED ORDER — APIXABAN 5 MG PO TABS: 5.0000 mg | ORAL_TABLET | Freq: Two times a day (BID) | ORAL | 2 refills | Status: DC

## 2021-10-25 MED ORDER — NICOTINE 21 MG/24HR TD PT24: 21.0000 mg | MEDICATED_PATCH | Freq: Every day | TRANSDERMAL | 0 refills | Status: DC

## 2021-10-25 MED ORDER — DILTIAZEM HCL ER COATED BEADS 120 MG PO CP24: 120.0000 mg | ORAL_CAPSULE | Freq: Every day | ORAL | 11 refills | Status: DC

## 2021-10-25 MED ORDER — THIAMINE HCL 100 MG PO TABS: 100.0000 mg | ORAL_TABLET | Freq: Every day | ORAL | 3 refills | Status: DC

## 2021-10-25 MED ORDER — CHLORDIAZEPOXIDE HCL 10 MG PO CAPS
10.0000 mg | ORAL_CAPSULE | ORAL | 0 refills | Status: DC
Start: 2021-10-25 — End: 2021-11-09

## 2021-10-25 MED ORDER — POTASSIUM CHLORIDE CRYS ER 20 MEQ PO TBCR
40.0000 meq | EXTENDED_RELEASE_TABLET | ORAL | Status: AC
  Administered 2021-10-25 (×2): 40 meq via ORAL
  Filled 2021-10-25 (×2): qty 2

## 2021-10-25 MED ORDER — PANTOPRAZOLE SODIUM 40 MG PO TBEC: 40.0000 mg | DELAYED_RELEASE_TABLET | Freq: Every day | ORAL | 3 refills | Status: DC

## 2021-10-25 MED ORDER — DIGOXIN 125 MCG PO TABS: 0.1250 mg | ORAL_TABLET | Freq: Every day | ORAL | 4 refills | Status: DC

## 2021-10-25 MED ORDER — METOPROLOL SUCCINATE ER 100 MG PO TB24: 100.0000 mg | ORAL_TABLET | Freq: Every day | ORAL | 3 refills | Status: DC

## 2021-10-25 NOTE — Progress Notes (Signed)
Progress Note  Patient Name: Omar Mccarthy Date of Encounter: 10/25/2021  [facility] Cardiologist: Carlyle Dolly, MD   Subjective   No complaints  Inpatient Medications    Scheduled Meds:  apixaban  5 mg Oral BID   Chlorhexidine Gluconate Cloth  6 each Topical Daily   diazepam  5 mg Oral TID   digoxin  125 mcg Oral Daily   diltiazem  30 mg Oral TID   folic acid  1 mg Oral Daily   metoprolol succinate  100 mg Oral Daily   multivitamin with minerals  1 tablet Oral Daily   nicotine  21 mg Transdermal Daily   potassium chloride  40 mEq Oral Q3H   thiamine  100 mg Oral Daily   Or   thiamine  100 mg Intravenous Daily   Continuous Infusions:  sodium chloride 100 mL/hr at 10/25/21 0747   PRN Meds: acetaminophen, LORazepam **OR** LORazepam, ondansetron (ZOFRAN) IV   Vital Signs    Vitals:   10/25/21 0500 10/25/21 0600 10/25/21 0722 10/25/21 0830  BP:  (!) 163/107  (!) 143/98  Pulse: 83  78 67  Resp: (!) 26 (!) 23  17  Temp:    98.1 F (36.7 C)  TempSrc:      SpO2: 100%  100% 99%  Weight:      Height:        Intake/Output Summary (Last 24 hours) at 10/25/2021 0856 Last data filed at 10/25/2021 0747 Gross per 24 hour  Intake 2451.38 ml  Output --  Net 2451.38 ml      10/25/2021    3:54 AM 10/24/2021    5:00 AM 10/23/2021    7:23 PM  Last 3 Weights  Weight (lbs) 235 lb 3.7 oz 229 lb 11.5 oz 235 lb  Weight (kg) 106.7 kg 104.2 kg 106.595 kg      Telemetry    Afib variable rates - Personally Reviewed  ECG    N/a - Personally Reviewed  Physical Exam   GEN: No acute distress.   Neck: No JVD Cardiac: irreg, 2/6 systolic murmur rusb Respiratory: Clear to auscultation bilaterally. GI: Soft, nontender, non-distended  MS: No edema; No deformity. Neuro:  Nonfocal  Psych: Normal affect   Labs    High Sensitivity Troponin:   Recent Labs  Lab 10/23/21 1934 10/23/21 2119  TROPONINIHS 12 14     Chemistry Recent Labs  Lab 10/23/21 1934  10/24/21 0337 10/25/21 0319  NA 139 140 137  K 4.4 3.5 2.8*  CL 105 107 108  CO2 22 20* 24  GLUCOSE 138* 143* 107*  BUN 16 17 7   CREATININE 0.87 0.74 0.35*  CALCIUM 8.0* 8.2* 7.1*  MG  --  2.0  --   PROT  --  6.7 5.4*  ALBUMIN  --  3.6 2.9*  AST  --  102* 77*  ALT  --  57* 48*  ALKPHOS  --  84 66  BILITOT  --  0.9 2.1*  GFRNONAA >60 >60 >60  ANIONGAP 12 13 5     Lipids No results for input(s): CHOL, TRIG, HDL, LABVLDL, LDLCALC, CHOLHDL in the last 168 hours.  Hematology Recent Labs  Lab 10/23/21 1934 10/24/21 0337 10/25/21 0319  WBC 6.7 6.1 4.5  RBC 4.90 4.48 3.86*  HGB 15.6 14.2 12.5*  HCT 45.4 41.4 35.6*  MCV 92.7 92.4 92.2  MCH 31.8 31.7 32.4  MCHC 34.4 34.3 35.1  RDW 13.0 13.0 12.8  PLT 67* 66* 43*  Thyroid No results for input(s): TSH, FREET4 in the last 168 hours.  BNPNo results for input(s): BNP, PROBNP in the last 168 hours.  DDimer No results for input(s): DDIMER in the last 168 hours.   Radiology    DG Chest Portable 1 View  Result Date: 10/23/2021 CLINICAL DATA:  Shortness of breath, palpitations EXAM: PORTABLE CHEST 1 VIEW COMPARISON:  04/09/2021 FINDINGS: Lungs are clear.  No pleural effusion or pneumothorax. The heart is normal in size. IMPRESSION: No evidence of acute cardiopulmonary disease. Electronically Signed   By: Julian Hy M.D.   On: 10/23/2021 19:52   ECHOCARDIOGRAM LIMITED  Result Date: 10/24/2021    ECHOCARDIOGRAM LIMITED REPORT   Patient Name:   ROI HJORT Dicaprio Date of Exam: 10/24/2021 Medical Rec #:  [id number]        Height:       69.0 in Accession #:    [id number]       Weight:       229.7 lb Date of Birth:  13-Mar-1991        BSA:          2.191 m Patient Age:    31 years         BP:           114/61 mmHg Patient Gender: M                HR:           90 bpm. [facility]am Location[facility]  [facility] Procedure: Limited Echo Indications:    Atrial Fibrillation  History:        Patient has prior history of Echocardiogram examinations, most                  recent 04/10/2021. CHF, Arrythmias:Tachycardia and Atrial                 Fibrillation, Signs/Symptoms:Chest Pain; Risk Factors:Current                 Smoker. ETOH and Polysubstance abuse.  Sonographer:    Wenda Low Referring Phys: [id number] [place]  1. Right ventricular systolic function is normal. The right ventricular size is normal.  2. The aortic valve has an indeterminant number of cusps.  3.     LVEF 65-70%. Difficult evaluation of LVOT gradient based on available Dopplers and in the setting of afib with RVR. Consider repeat study when rates better controlled. FINDINGS  Left Ventricle: LVEF 65-70%. Difficult evaluation of LVOT gradient based on available Dopplers and in the setting of afib with RVR. Consider repeat study when rates better controlled. Left ventricular ejection fraction, by estimation, is 65 to 70%. The left ventricle has normal function. Right Ventricle: The right ventricular size is normal. Right vetricular wall thickness was not well visualized. Right ventricular systolic function is normal. Aortic Valve: The aortic valve has an indeterminant number of cusps. Aorta: The aortic root is normal in size and structure. LEFT VENTRICLE PLAX 2D LVIDd:         4.40 cm LVIDs:         2.80 cm LV PW:         1.20 cm LV IVS:        1.40 cm LVOT diam:     2.20 cm LVOT Area:     3.80 cm  LEFT ATRIUM         Index LA diam:    4.80 cm 2.19 cm/m   AORTA  Ao Root diam: 3.20 cm  SHUNTS Systemic Diam: 2.20 cm Carlyle Dolly MD Electronically signed by Carlyle Dolly MD Signature Date/Time: 10/24/2021/12:09:34 PM    Final     Cardiac Studies     Patient Profile     RABON HAGEE is a 31 y.o. 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 Josephine Cables.  Assessment & Plan    1.PAF - diagnosed initially during admission 12/2019 in setting of severe EtoH withdrawal and sepsis. Difficultly with rate control, he had outpatient DCCV 02/24/20. - Initially stated on dilt gtt, low bp's and transitoned to IV amiodarone by primary team.  - With liver disease, poor compliance,  and chronic EtoH use and unstable electrolytes poor candidate for any antiarrhythmic.  - chronic thrombocytopenia down to 43K. Under 50K would avoid anticoag as risk of bleeding is increased. Will stop eliquis - he is on digoxin 0.125mg , toprol 100mg , diltiazem 30mg  tid. Rates reasonably well controlled, occasional elevations in HRs. Will change dilt to 60mg  bid  2. 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 Reasonable given rates are controlled on oral regimen, will arrange outpatient f/u and sign off inpatient care. At f/u needs repeat cbc, if platelets above 50K restart eliquis.    For questions or updates, please contact [place] Please consult [email or web address] for contact info under        Signed, Carlyle Dolly, MD  10/25/2021, 8:56 AM

## 2021-10-25 NOTE — Discharge Summary (Signed)
Omar Mccarthy, is a 31 y.o. male  DOB 04-Apr-1991  MRN [id number].  Admission date:  10/23/2021  Admitting Physician  Bernadette Hoit, DO  Discharge Date:  10/25/2021   Primary MD  Pcp, No  Recommendations for primary care physician for things to follow:   1)Please take Librium/chlordiazepoxide take 2 tablets 3 times a day for 2 days, then 1 tablet 3 times a day for 2 days, then 1 tablet twice daily for 2 days, then 1 tablet daily for 2 days for prevention of alcohol withdrawal symptoms 2)Complete abstinence from alcohol and street/ illicit drugs strongly advised 3)Abstinence from tobacco advised--May use nicotine patch to help you quit smoking 4) please hold Eliquis/apixaban blood thinner until your platelet count is over 50,000 5) please repeat a complete blood count test with your primary care physician around Monday, 10/29/2021 to see if your platelet count has come up to over 50,000 6)Avoid ibuprofen/Advil/Aleve/Motrin/Goody Powders/Naproxen/BC powders/Meloxicam/Diclofenac/Indomethacin and other Nonsteroidal anti-inflammatory medications as these will make you more likely to bleed and can cause stomach ulcers, can also cause Kidney problems.  7) outpatient follow-up with cardiology for repeat echocardiogram and possible cardioversion procedure for atrial fibrillation strongly advised  Admission Diagnosis  Atrial fibrillation with RVR ([place]) 0000000 Alcoholic intoxication without complication ([place]) 123456 Alcohol dependence with unspecified alcohol-induced disorder ([place]) [F10.29] Paroxysmal atrial fibrillation with RVR ([place]) [I48.0] Depression, unspecified depression type [F32.A]   Discharge Diagnosis  Atrial fibrillation with RVR (H[facility]) 0000000 Alcoholic intoxication without complication ([place]) 123456 Alcohol dependence with unspecified alcohol-induced disorder ([place]) [F10.29] Paroxysmal atrial  fibrillation with RVR (HCC) [I48.0] Depression, unspecified depression type [F32.A]    Principal Problem:   Paroxysmal atrial fibrillation with RVR (HCC) Active Problems:   HFrEF (heart failure with reduced ejection fraction) (HCC)   Hyperglycemia   Thrombocytopenia (HCC)   Nausea and vomiting   History of alcohol abuse   Diarrhea   Mood disorder (HCC)   Hypertrophic cardiomyopathy (HCC)   Obesity (BMI 30-39.9)   Tobacco abuse      Past Medical History:  Diagnosis Date   Atrial fibrillation [facility])    a. diagnosed in 12/2019   Hypertension     Past Surgical History:  Procedure Laterality Date   CARDIOVERSION N/A 02/24/2020   Procedure: CARDIOVERSION;  Surgeon: Satira Sark, MD;  Location[facility];  Service: Cardiovascular;  Laterality: N/A;      HPI  from the history and physical done on the day of admission:   HPI: Omar Mccarthy is a 31 y.o. male with medical history significant for atrial fibrillation in the setting of alcohol intoxication, alcohol abuse and intoxication, depression and anxiety, respiratory failure requiring intubation who presents to the emergency department due to 1 day onset of palpitations, nausea, vomiting and associated shortness of breath.  Palpitations was persistent and worsening.  Patient states that he has been compliant with his medications, though occasionally, he misses a dose of digoxin about once a week.  Patient also complaining of nonbloody diarrhea and he wanted to  be evaluated due to concern for depression without suicidal ideation.  He admitted to alcohol consumption (liquor -half a gallon of vodka in 2 days) so, he decided to go to the ED for further evaluation and management. He was admitted from 04/09/2021 to 04/14/2021 due to acute alcohol hepatitis with alcohol intoxication during which he was seen by GI and was started on prednisolone therapy and discharged on prednisone taper.   ED Course:  In the emergency department,  he was hemodynamically stable.  Work-up in the ED showed normal CBC except for thrombocytopenia and normal BMP except for hyperglycemia.  Troponin x2 was negative, digoxin level 0.6, alcohol level was 413. Chest x-ray showed no evidence of acute cardiopulmonary disease Was started on IV Cardizem drip, IV hydration was provided, he was started on CIWA protocol and hospitalist was asked to admit patient for further evaluation and management.   Review of Systems: Review of systems as noted in the HPI. All other systems reviewed and are negative.     Hospital Course:   Assessment and Plan:  1)Paroxysmal Atrial Fibrillation (diagnosed in 12/2019 with successful DCCV in 01/2020)--- in the setting of acute alcohol intoxication -BAL was over 400 on admission -Cardiology input appreciated -Echo with EF of 65 to 70% Initially treated with IV amiodarone and then transition to oral Cardizem, Toprol-XL and oral digoxin,,  -Free T1.0, TSH 3.0 -May resume Eliquis for stroke prophylaxis once platelet count is over 50,000   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)--repeat echo on 10/24/2021 with EF of 65 to 70% - repeat echocardiogram to evaluate for possible hypertrophic cardiomyopathy given increased LVOT when rate controlled noted - continue Toprol-XL for now -Continue oral digoxin    3) alcohol abuse with alcoholic hepatitis -Elevated LFTs noted (AST: ALT ratio is about 2 to 1--consistent with alcoholic hepatitis) -PTA patient was drinking about half a gallon of vodka on most days -10/24/21- --Patient has history of severe DTs requiring intubation in the past -Responded well to benzos for DTs at this time -Much improved on discharge on Librium taper -Outpatient or inpatient alcohol rehab program advised - 4) thrombocytopenia--suspect due to combination of myelosuppression from direct toxic alcohol effects on bone marrow and also due to underlying alcohol hepatitis -No bleeding concerns at this time Hold  Eliquis until platelet count rises above 50,000 repeat CBC with PCP as advised   5) tobacco abuse--- not ready to quit okay to use nicotine patch   6)Class II Obesity- -Low calorie diet, portion control and increase physical activity discussed with patient -Body mass index is 33.92 kg/m.  Disposition/----discharge home    Disposition: The patient is from: Home              Anticipated d/c is to: Home                Code Status :  -  Code Status: Full Code    Family Communication:   Discussed with patient's wife at bedside  Discharge Condition: Stable  Follow UP   Follow-up Information     Erma Heritage, PA-C Follow up on 11/09/2021.   Specialties: Physician Assistant, Cardiology Why: Cardiology Follow-up on 11/09/2021 at 2:30 PM. Please call the office if needing a different date or time. Contact information: [place] [address] [phone]                  Consults obtained -cardiology  Diet and Activity recommendation:  As advised  Discharge Instructions  Discharge Instructions     Call MD for:  difficulty breathing, headache or visual disturbances   Complete by: As directed    Call MD for:  persistant dizziness or light-headedness   Complete by: As directed    Call MD for:  persistant nausea and vomiting   Complete by: As directed    Call MD for:  severe uncontrolled pain   Complete by: As directed    Call MD for:  temperature >100.4   Complete by: As directed    Diet - low sodium heart healthy   Complete by: As directed    Discharge instructions   Complete by: As directed    1)Please take Librium/chlordiazepoxide take 2 tablets 3 times a day for 2 days, then 1 tablet 3 times a day for 2 days, then 1 tablet twice daily for 2 days, then 1 tablet daily for 2 days for prevention of alcohol withdrawal symptoms 2)Complete abstinence from alcohol and street/ illicit drugs strongly advised 3)Abstinence from tobacco advised--May use  nicotine patch to help you quit smoking 4) please hold Eliquis/apixaban blood thinner until your platelet count is over 50,000 5) please repeat a complete blood count test with your primary care physician around Monday, 10/29/2021 to see if your platelet count has come up to over 50,000 6)Avoid ibuprofen/Advil/Aleve/Motrin/Goody Powders/Naproxen/BC powders/Meloxicam/Diclofenac/Indomethacin and other Nonsteroidal anti-inflammatory medications as these will make you more likely to bleed and can cause stomach ulcers, can also cause Kidney problems.  7) outpatient follow-up with cardiology for repeat echocardiogram and possible cardioversion procedure for atrial fibrillation strongly advised   Increase activity slowly   Complete by: As directed          Discharge Medications     Allergies as of 10/25/2021   No Known Allergies      Medication List     STOP taking these medications    Suboxone 8-2 MG Film Generic drug: Buprenorphine HCl-Naloxone HCl       TAKE these medications    apixaban 5 MG Tabs tablet Commonly known as: ELIQUIS Take 1 tablet (5 mg total) by mouth 2 (two) times daily. Do not start until platelet count is over 50 K Start taking on: November 05, 2021   chlordiazePOXIDE 10 MG capsule Commonly known as: LIBRIUM Take 1 capsule (10 mg total) by mouth See admin instructions. Take 2 tablets 3 times a day for 2 days, then 1 tablet 3 times a day for 2 days, then 1 tablet twice daily for 2 days, then 1 tablet daily for 2 days for prevention of alcohol withdrawal symptoms   digoxin 0.125 MG tablet Commonly known as: LANOXIN Take 1 tablet (0.125 mg total) by mouth daily.   diltiazem 120 MG 24 hr capsule Commonly known as: Cardizem CD Take 1 capsule (120 mg total) by mouth daily.   metoprolol succinate 100 MG 24 hr tablet Commonly known as: TOPROL-XL Take 1 tablet (100 mg total) by mouth daily. What changed: See the new instructions.   multivitamin with minerals Tabs  tablet Take 1 tablet by mouth daily.   nicotine 21 mg/24hr patch Commonly known as: NICODERM CQ - dosed in mg/24 hours Place 1 patch (21 mg total) onto the skin daily. Start taking on: October 26, 2021   pantoprazole 40 MG tablet Commonly known as: PROTONIX Take 1 tablet (40 mg total) by mouth daily. What changed:  when to take this additional instructions   thiamine 100 MG tablet Take 1 tablet (100 mg total) by mouth  daily. Start taking on: October 26, 2021        Major procedures and Radiology Reports - PLEASE review detailed and final reports for all details, in brief -  DG Chest Portable 1 View  Result Date: 10/23/2021 CLINICAL DATA:  Shortness of breath, palpitations EXAM: PORTABLE CHEST 1 VIEW COMPARISON:  04/09/2021 FINDINGS: Lungs are clear.  No pleural effusion or pneumothorax. The heart is normal in size. IMPRESSION: No evidence of acute cardiopulmonary disease. Electronically Signed   By: Julian Hy M.D.   On: 10/23/2021 19:52   ECHOCARDIOGRAM LIMITED  Result Date: 10/24/2021    ECHOCARDIOGRAM LIMITED REPORT   Patient Name:   Omar Mccarthy Date of Exam: 10/24/2021 Medical Rec #:  [id number]        Height:       69.0 in Accession #:    [id number]       Weight:       229.7 lb Date of Birth:  15-May-1991        BSA:          2.191 m Patient Age:    30 years         BP:           114/61 mmHg Patient Gender: M                HR:           90 bpm. Exam Location:  [facility] Procedure: Limited Echo Indications:    Atrial Fibrillation  History:        Patient has prior history of Echocardiogram examinations, most                 recent 04/10/2021. CHF, Arrythmias:Tachycardia and Atrial                 Fibrillation, Signs/Symptoms:Chest Pain; Risk Factors:Current                 Smoker. ETOH and Polysubstance abuse.  Sonographer:    Wenda Low Referring Phys: [id number] [place]  1. Right ventricular systolic function is normal. The right ventricular size is  normal.  2. The aortic valve has an indeterminant number of cusps.  3.     LVEF 65-70%. Difficult evaluation of LVOT gradient based on available Dopplers and in the setting of afib with RVR. Consider repeat study when rates better controlled. FINDINGS  Left Ventricle: LVEF 65-70%. Difficult evaluation of LVOT gradient based on available Dopplers and in the setting of afib with RVR. Consider repeat study when rates better controlled. Left ventricular ejection fraction, by estimation, is 65 to 70%. The left ventricle has normal function. Right Ventricle: The right ventricular size is normal. Right vetricular wall thickness was not well visualized. Right ventricular systolic function is normal. Aortic Valve: The aortic valve has an indeterminant number of cusps. Aorta: The aortic root is normal in size and structure. LEFT VENTRICLE PLAX 2D LVIDd:         4.40 cm LVIDs:         2.80 cm LV PW:         1.20 cm LV IVS:        1.40 cm LVOT diam:     2.20 cm LVOT Area:     3.80 cm  LEFT ATRIUM         Index LA diam:    4.80 cm 2.19 cm/m   AORTA Ao Root diam: 3.20 cm  SHUNTS Systemic Diam:  2.20 cm Carlyle Dolly MD Electronically signed by Carlyle Dolly MD Signature Date/Time: 10/24/2021/12:09:34 PM    Final     Micro Results  Recent Results (from the past 240 hour(s))  MRSA Next Gen by PCR, Nasal     Status: None   Collection Time: 10/24/21  1:13 AM   Specimen: Nasal Mucosa; Nasal Swab  Result Value Ref Range Status   MRSA by PCR Next Gen NOT DETECTED NOT DETECTED Final    Comment: (NOTE) The GeneXpert MRSA Assay (FDA approved for NASAL specimens only), is one component of a comprehensive MRSA colonization surveillance program. It is not intended to diagnose MRSA infection nor to guide or monitor treatment for MRSA infections. Test performance is not FDA approved in patients less than 42 years old. Performed at [facility], [address], [place], [place] [address]   Gastrointestinal Panel by PCR ,  Stool     Status: None   Collection Time: 10/24/21  5:54 PM   Specimen: Stool  Result Value Ref Range Status   Campylobacter species NOT DETECTED NOT DETECTED Final   Plesimonas shigelloides NOT DETECTED NOT DETECTED Final   Salmonella species NOT DETECTED NOT DETECTED Final   Yersinia enterocolitica NOT DETECTED NOT DETECTED Final   Vibrio species NOT DETECTED NOT DETECTED Final   Vibrio cholerae NOT DETECTED NOT DETECTED Final   Enteroaggregative E coli (EAEC) NOT DETECTED NOT DETECTED Final   Enteropathogenic E coli (EPEC) NOT DETECTED NOT DETECTED Final   Enterotoxigenic E coli (ETEC) NOT DETECTED NOT DETECTED Final   Shiga like toxin producing E coli (STEC) NOT DETECTED NOT DETECTED Final   Shigella/Enteroinvasive E coli (EIEC) NOT DETECTED NOT DETECTED Final   Cryptosporidium NOT DETECTED NOT DETECTED Final   Cyclospora cayetanensis NOT DETECTED NOT DETECTED Final   Entamoeba histolytica NOT DETECTED NOT DETECTED Final   Giardia lamblia NOT DETECTED NOT DETECTED Final   Adenovirus F40/41 NOT DETECTED NOT DETECTED Final   Astrovirus NOT DETECTED NOT DETECTED Final   Norovirus GI/GII NOT DETECTED NOT DETECTED Final   Rotavirus A NOT DETECTED NOT DETECTED Final   Sapovirus (I, II, IV, and V) NOT DETECTED NOT DETECTED Final    Comment: Performed at [facility], [address] [place], [place] [address]  C Difficile Quick Screen w PCR reflex     Status: None   Collection Time: 10/24/21  5:54 PM  Result Value Ref Range Status   C Diff antigen NEGATIVE NEGATIVE Final   C Diff toxin NEGATIVE NEGATIVE Final   C Diff interpretation No C. difficile detected.  Final    Comment: Performed at [facility], [address], [place], [place] [address]    Today   [place] today has no new complaints No fever  Or chills   No Nausea, Vomiting or Diarrhea         Patient has been seen and examined prior to discharge   Objective   Blood pressure (!)  143/98, pulse 67, temperature 98.1 F (36.7 C), resp. rate 17, height 5\' 9"  (1.753 m), weight 106.7 kg, SpO2 99 %.   Intake/Output Summary (Last 24 hours) at 10/25/2021 1132 Last data filed at 10/25/2021 0747 Gross per 24 hour  Intake 2057.9 ml  Output --  Net 2057.9 ml   Exam Gen:- Awake Alert, no acute distress  HEENT:- [place].AT, No sclera icterus Neck-Supple Neck,No JVD,.  Lungs-  CTAB , good air movement bilaterally CV- S1, S2 normal, irregular Abd-  +  ve B.Sounds, Abd Soft, No tenderness,    Extremity/Skin:- No  edema,   good pulses Psych-affect is appropriate, oriented x3 Neuro-no new focal deficits, no tremors    Data Review   CBC w Diff:  Lab Results  Component Value Date   WBC 4.5 10/25/2021   HGB 12.5 (L) 10/25/2021   HCT 35.6 (L) 10/25/2021   PLT 43 (L) 10/25/2021   LYMPHOPCT 23 10/23/2021   MONOPCT 9 10/23/2021   EOSPCT 0 10/23/2021   BASOPCT 1 10/23/2021    CMP:  Lab Results  Component Value Date   NA 137 10/25/2021   K 2.8 (L) 10/25/2021   CL 108 10/25/2021   CO2 24 10/25/2021   BUN 7 10/25/2021   CREATININE 0.35 (L) 10/25/2021   PROT 5.4 (L) 10/25/2021   ALBUMIN 2.9 (L) 10/25/2021   BILITOT 2.1 (H) 10/25/2021   ALKPHOS 66 10/25/2021   AST 77 (H) 10/25/2021   ALT 48 (H) 10/25/2021  .  Total Discharge time is about 33 minutes  Roxan Hockey M.D on 10/25/2021 at 11:32 AM  Go to [email or web address] -  for contact info  [facility] - Office  [phone]

## 2021-10-25 NOTE — Progress Notes (Signed)
Patient provided discharge instructions. Patient verbalized understanding with spouse at bedside. Patient and wife requested work note. Dr. Mariea Clonts made aware. Peripheral IV removed. Taken down to main entrance via wheelchair to be discharged to home.

## 2021-10-26 ENCOUNTER — Telehealth: Payer: Self-pay | Admitting: Cardiology

## 2021-10-26 DIAGNOSIS — D696 Thrombocytopenia, unspecified: Secondary | ICD-10-CM

## 2021-10-26 NOTE — Telephone Encounter (Signed)
Left a message for pt to call office back.  Per Dr. Nelly Laurence hospital note from 10/23/2020:  chronic thrombocytopenia down to 43K. Under 50K would avoid anticoag as risk of bleeding is increased. Will stop eliquis.   Pt has an appt with B. Strader, PA-C on 11/09/2021 to re-address whether to restart Eliquis or not. Pt needs CBC prior to appt per Bernerd Pho, PA-C.

## 2021-10-26 NOTE — Telephone Encounter (Signed)
Pt c/o medication issue:  1. Name of Medication:   apixaban (ELIQUIS) 5 MG TABS tablet    2. How are you currently taking this medication (dosage and times per day)? Take 1 tablet (5 mg total) by mouth 2 (two) times daily. Do not start until platelet count is over 50 K  3. Are you having a reaction (difficulty breathing--STAT)?  No  4. What is your medication issue?  Pt states that he needs clarification on when to take medication. Pt says that he was told by Dr. Wyline Mood to start taking medication on 11/05/21, then was told by PA not to take. Please advise

## 2021-10-29 ENCOUNTER — Telehealth: Payer: Self-pay

## 2021-10-29 NOTE — Telephone Encounter (Signed)
Called Omar Mccarthy to follow up as referral from Social worker from [facility]. He was recently discharged. Has no insurance and no primary care provider.  Omar Mccarthy states he is interested, but is currently at work. He states he will call back today once he is off work today after [place] [employer]

## 2021-10-29 NOTE — Telephone Encounter (Signed)
Mr Blincoe returned call after getting off of work. Began asking questions regarding Care connect program and eligibility questions. Mr Lucibello is working, he is uninsured, however he resides in [place] which unfortunately makes him ineligible for Care Connect due to not living in [place].  Dicussed alternatives for Mr Sitter. He gave permission to text information. Provided him with Contact information for [facility] in [place], [place] they will take uninsured, copayment based on income on a sliding scale . Also provided [facility] in [place] contact information also, as he states he sees cardiologist in [place]. Will plan to call Him on Wednesday on his day off to check in. He is agreeable. He does report he did get all of his discharge medications at this time.  Plan: will call to follow up and give more resources as needed for primary care providers.   [place] [employer]

## 2021-10-29 NOTE — Telephone Encounter (Signed)
Spoke to pt who is aware not to start Eliquis until he is seen by provider on 6/16 for reassessment of thrombocytopenia. Pt aware to come to [facility] earlier than appt time to have CBC drawn in lab. Pt had no further questions or concerns at this time.

## 2021-11-09 ENCOUNTER — Encounter: Payer: Self-pay | Admitting: Student

## 2021-11-09 ENCOUNTER — Ambulatory Visit (INDEPENDENT_AMBULATORY_CARE_PROVIDER_SITE_OTHER): Payer: Self-pay | Admitting: Student

## 2021-11-09 ENCOUNTER — Other Ambulatory Visit (HOSPITAL_COMMUNITY)
Admission: RE | Admit: 2021-11-09 | Discharge: 2021-11-09 | Disposition: A | Discharge status: Home / Self Care | Payer: Medicaid Other | Source: Ambulatory Visit | Attending: Student | Admitting: Student

## 2021-11-09 VITALS — BP 140/82 | HR 62 | Ht 68.0 in | Wt 235.0 lb

## 2021-11-09 DIAGNOSIS — F1010 Alcohol abuse, uncomplicated: Secondary | ICD-10-CM

## 2021-11-09 DIAGNOSIS — D696 Thrombocytopenia, unspecified: Secondary | ICD-10-CM | POA: Insufficient documentation

## 2021-11-09 DIAGNOSIS — I4819 Other persistent atrial fibrillation: Secondary | ICD-10-CM

## 2021-11-09 LAB — CBC
HCT: 41.8 % (ref 39.0–52.0)
Hemoglobin: 14.1 g/dL (ref 13.0–17.0)
MCH: 31.5 pg (ref 26.0–34.0)
MCHC: 33.7 g/dL (ref 30.0–36.0)
MCV: 93.3 fL (ref 80.0–100.0)
Platelets: 308 10*3/uL (ref 150–400)
RBC: 4.48 MIL/uL (ref 4.22–5.81)
RDW: 12.6 % (ref 11.5–15.5)
WBC: 8.9 10*3/uL (ref 4.0–10.5)
nRBC: 0 % (ref 0.0–0.2)

## 2021-11-09 MED ORDER — APIXABAN 5 MG PO TABS: 5.0000 mg | ORAL_TABLET | Freq: Two times a day (BID) | ORAL | 0 refills | Status: DC

## 2021-11-09 NOTE — Progress Notes (Unsigned)
Cardiology Office Note    Date:  11/10/2021   ID:  Omar Mccarthy, DOB 06-24-1990, MRN [id number]  PCP:  Merryl Hacker, No  Cardiologist: Carlyle Dolly, MD    Chief Complaint  Patient presents with   Hospitalization Follow-up    History of Present Illness:    Omar Mccarthy is a 31 y.o. 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   He most recently presented back to [facility] ED on 10/23/2021 for evaluation of worsening palpitations and was found to be in atrial fibrillation with RVR.  He did report missing doses of Toprol-XL and Digoxin and had been off Eliquis for 6+ months. Was also consuming a significant amount of alcohol including a half a gallon of liquor every 1 to 2 days. It was recommended to pursue rate control at that time and if he was compliant with anticoagulation, could consider DCCV as an outpatient. His platelet count did drop during admission with noted thrombocytopenia at 43 K. Therefore, Eliquis was discontinued and was recommended to recheck a CBC as an outpatient and if platelet count was above 50 K, would restart Eliquis. Otherwise, would consider a rate control strategy. He was transitioned to Digoxin 0.125 mg daily, Toprol-XL 100 mg daily and Cardizem CD 120mg  daily at discharge.  In talking with the patient today, he reports much improvement in his symptoms since his hospitalization but still feels fatigued when in atrial fibrillation. His heart rate is well controlled in the 60's today. In reviewing his medication list, he reports he has been taking Cardizem CD and Digoxin but did not continue Toprol-XL as he thought this was discontinued. Says his breathing has been stable with no specific orthopnea, PND or  pitting edema. Reports no longer consuming alcohol since hospital discharge.  Repeat CBC today showed his platelet count has improved to 308 K.   Past Medical History:  Diagnosis Date   Atrial fibrillation ([place])    a. diagnosed in 12/2019 with successful DCCV in 01/2020 b. recurrence in 09/2021 in the setting of significant alcohol use   Hypertension     Past Surgical History:  Procedure Laterality Date   CARDIOVERSION N/A 02/24/2020   Procedure: CARDIOVERSION;  Surgeon: Satira Sark, MD;  Location: [facility];  Service: Cardiovascular;  Laterality: N/A;    Current Medications: Outpatient Medications Prior to Visit  Medication Sig Dispense Refill   digoxin (LANOXIN) 0.125 MG tablet Take 1 tablet (0.125 mg total) by mouth daily. 30 tablet 4   diltiazem (CARDIZEM CD) 120 MG 24 hr capsule Take 1 capsule (120 mg total) by mouth daily. 30 capsule 11   metoprolol succinate (TOPROL-XL) 100 MG 24 hr tablet Take 1 tablet (100 mg total) by mouth daily. 30 tablet 3   apixaban (ELIQUIS) 5 MG TABS tablet Take 1 tablet (5 mg total) by mouth 2 (two) times daily. Do not start until platelet count is over 50 K (Patient not taking: Reported on 11/09/2021) 60 tablet 2   Multiple Vitamin (MULTIVITAMIN WITH MINERALS) TABS tablet Take 1 tablet by mouth daily. (Patient not taking: Reported on 10/23/2021) 30 tablet 2   nicotine (NICODERM CQ - DOSED IN MG/24 HOURS) 21 mg/24hr patch Place 1 patch (21 mg total) onto the skin daily. (Patient  not taking: Reported on 11/09/2021) 28 patch 0   pantoprazole (PROTONIX) 40 MG tablet Take 1 tablet (40 mg total) by mouth daily. (Patient not taking: Reported on 11/09/2021) 30 tablet 3   thiamine 100 MG tablet Take 1 tablet (100 mg total) by mouth daily. (Patient not taking: Reported on 11/09/2021) 30 tablet 3   chlordiazePOXIDE (LIBRIUM) 10 MG capsule Take 1 capsule (10 mg total) by mouth See admin instructions. Take 2 tablets 3 times a day for 2 days, then 1 tablet 3 times  a day for 2 days, then 1 tablet twice daily for 2 days, then 1 tablet daily for 2 days for prevention of alcohol withdrawal symptoms 30 capsule 0   No facility-administered medications prior to visit.     Allergies:   Patient has no known allergies.   Social History   Socioeconomic History   Marital status: Married    Spouse name: Not on file   Number of children: Not on file   Years of education: Not on file   Highest education level: Not on file  Occupational History   Not on file  Tobacco Use   Smoking status: Every Day    Packs/day: 1.00    Types: Cigarettes   Smokeless tobacco: Never  Vaping Use   Vaping Use: Never used  Substance and Sexual Activity   Alcohol use: Yes    Comment: pt states "I drink 1 gallon of vodka every 2 days"   Drug use: Not Currently    Types: Methamphetamines, Marijuana    Comment: Prior marijuana.  intermittent use of vicodin (not prescribed). 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   Review of Systems:    Please see the history of present illness.     All other systems reviewed and are otherwise negative except as noted above.   Physical Exam:    VS:  BP 140/82   Pulse 62   Ht 5\' 8"  (1.727 m)   Wt 235 lb (106.6 kg)   BMI 35.73 kg/m    General: Well developed, well nourished,male appearing in no acute distress. Head: Normocephalic, atraumatic. Neck: No carotid bruits. JVD not elevated.  Lungs: Respirations regular and unlabored, without wheezes or rales.   Heart: Irregularly irregular. No S3 or S4.  No murmur, no rubs, or gallops appreciated. Abdomen: Appears non-distended. No obvious abdominal masses. Msk:  Strength and tone appear normal for age. No obvious joint deformities or effusions. Extremities: No clubbing or cyanosis. No pitting edema.  Distal pedal pulses are 2+ bilaterally. Neuro: Alert and oriented X 3. Moves all extremities spontaneously. No focal deficits noted. Psych:  Responds to questions appropriately with a normal affect. Skin: No rashes or lesions noted  Wt Readings from Last 3 Encounters:  11/09/21 235 lb (106.6 kg)  10/25/21 235 lb 3.7 oz (106.7 kg)  05/22/21 227 lb 9.6 oz (103.2 kg)     Studies/Labs Reviewed:   EKG:  EKG is ordered  today.  The ekg ordered today demonstrates rate controlled atrial fibrillation, heart rate 62.  Recent Labs: 04/09/2021: B Natriuretic Peptide 58.0 04/14/2021: TSH 3.031 10/24/2021: Magnesium 2.0 10/25/2021: ALT 48; BUN 7; Creatinine, Ser 0.35; Potassium 2.8; Sodium 137 11/09/2021: Hemoglobin 14.1; Platelets 308   Lipid Panel    Component Value Date/Time   TRIG 149 01/16/2020 0523    Additional studies/ records that were reviewed today include:   Limited Echo: 10/24/2021 IMPRESSIONS     1. Right ventricular systolic function is normal. The right ventricular  size is normal.   2. The aortic valve has an indeterminant number of cusps.   3.      LVEF 65-70%. Difficult evaluation of LVOT gradient based on available  Dopplers and in the setting of afib with RVR. Consider repeat study when  rates better controlled.   Assessment:    1. Persistent atrial fibrillation (HCC)   2. Thrombocytopenia (HCC)   3. Alcohol abuse      Plan:   In order of problems listed above:  1. Persistent Atrial Fibrillation - Was recently diagnosed with recurrent atrial fibrillation in the setting of recurrent alcohol use. He has since quit consuming alcohol and his platelet count has now  improved which allows for the use of anticoagulation. Will plan to start Eliquis 5 mg twice daily and anticipate DCCV in 3 weeks if still in atrial fibrillation. Patient assistance information provided for Eliquis along with samples. I encouraged him to make [place] aware if he misses a dose in the interim. Risks and benefits of DCCV reviewed and he wishes to proceed as he says he feels much better when in normal sinus rhythm. - He has only been taking Digoxin 0.125 mg daily and Cardizem CD 120 mg daily. Given his heart rate in the 60's today, I did not recommend restarting Toprol-XL at this time.  2. Thrombocytopenia - His platelet count was down to 43 K during admission but has since normalized to 308 K today. No reports of active bleeding. Will start anticoagulation as outlined above.   3. Alcohol Use - He denies any recurrent alcohol use since his recent admission. He was strongly advised to avoid alcohol as this appears to be the recurrent trigger for his arrhythmia over the past few years.   Shared Decision Making/Informed Consent:   Shared Decision Making/Informed Consent The risks (stroke, cardiac arrhythmias rarely resulting in the need for a temporary or permanent pacemaker, skin irritation or burns and complications associated with conscious sedation including aspiration, arrhythmia, respiratory failure and death), benefits (restoration of normal sinus rhythm) and alternatives of a direct current cardioversion were explained in detail to Mr. Mcconahy and he agrees to proceed.      Medication Adjustments/Labs and Tests Ordered: Current medicines are reviewed at length with the patient today.  Concerns regarding medicines are outlined above.  Medication changes, Labs and Tests ordered today are listed in the Patient Instructions below. Patient Instructions  Medication Instructions:   Re-start Elquis 5 mg twice a day   I have given you 3 week samples    Please complete patient  assistance form and return so we can get your medication approved quickly    Labwork: None today  Testing/Procedures: Tentative cardioversion Tuesday 12/04/21. We will call you with exact date.   Follow-Up: 1 month after cardioversion  Any Other Special Instructions Will Be Listed Below (If Applicable).  If you need a refill on your cardiac medications before your next appointment, please call your  pharmacy.    Signed, Ellsworth Lennox, PA-C  11/10/2021 5:15 PM    [facility] [address] [place], [place] [address] Phone: [phone] Fax: [phone]

## 2021-11-09 NOTE — Patient Instructions (Signed)
Medication Instructions:   Re-start Elquis 5 mg twice a day   I have given you 3 week samples    Please complete patient assistance form and return so we can get your medication approved quickly    Labwork: None today  Testing/Procedures: Tentative cardioversion Tuesday 12/04/21. We will call you with exact date.   Follow-Up: 1 month after cardioversion  Any Other Special Instructions Will Be Listed Below (If Applicable).  If you need a refill on your cardiac medications before your next appointment, please call your pharmacy.

## 2021-11-10 ENCOUNTER — Encounter: Payer: Self-pay | Admitting: Student

## 2021-11-15 ENCOUNTER — Telehealth: Payer: Self-pay | Admitting: *Deleted

## 2021-11-15 ENCOUNTER — Encounter: Payer: Self-pay | Admitting: *Deleted

## 2021-11-15 NOTE — Telephone Encounter (Signed)
Pt notified that he is scheduled for a cardioversion on December 12, 2021 with Dr. Wyline Mood. Pt will have pre op on December 11, 2021. Pt states that he will pick up letter on today or next week.

## 2021-11-21 ENCOUNTER — Telehealth: Payer: Self-pay | Admitting: Cardiology

## 2021-11-21 MED ORDER — DILTIAZEM HCL ER COATED BEADS 120 MG PO CP24: 120.0000 mg | ORAL_CAPSULE | Freq: Every day | ORAL | 11 refills | Status: DC

## 2021-11-21 NOTE — Telephone Encounter (Signed)
*  STAT* If patient is at the pharmacy, call can be transferred to refill team.   1. Which medications need to be refilled? (please list name of each medication and dose if known)   diltiazem (CARDIZEM CD) 120 MG 24 hr capsule  2. Which pharmacy/location (including street and city if local pharmacy) is medication to be sent to?  [facility] [address] [place] - [address]  3. Do they need a 30 day or 90 day supply?    30 day  Patient only has three more capsules left.

## 2021-11-21 NOTE — Telephone Encounter (Signed)
Completed.

## 2021-12-10 NOTE — Patient Instructions (Signed)
Omar Mccarthy  12/10/2021     @PREFPERIOPPHARMACY @   Your procedure is scheduled on  12/12/2021.   Report to 12/14/2021 at  0815  A.M.   Call this number if you have problems the morning of surgery:  [phone]   Remember:  Do not eat or drink after midnight.         DO NOT miss any doses of your eliquis before yor procedure.     Take these medicines the morning of surgery with A SIP OF WATER                                          cadiazem     Do not wear jewelry, make-up or nail polish.  Do not wear lotions, powders, or perfumes, or deodorant.  Do not shave 48 hours prior to surgery.  Men may shave face and neck.  Do not bring valuables to the hospital.  [facility] is not responsible for any belongings or valuables.  Contacts, dentures or bridgework may not be worn into surgery.  Leave your suitcase in the car.  After surgery it may be brought to your room.  For patients admitted to the hospital, discharge time will be determined by your treatment team.  Patients discharged the day of surgery will not be allowed to drive home and must have someone with them for 24 hours.    Special instructions:   DO NOT smoke tobacco or vape or consume any alcohol 24 hours before your procedure.  Please read over the following fact sheets that you were given. Anesthesia Post-op Instructions and Care and Recovery After Surgery      Electrical Cardioversion Electrical cardioversion is the delivery of a jolt of electricity to restore a normal rhythm to the heart. A rhythm that is too fast or is not regular keeps the heart from pumping well. In this procedure, sticky patches or metal paddles are placed on the chest to deliver electricity to the heart from a device. This procedure may be done in an emergency if: There is low or no blood pressure as a result of the heart rhythm. Normal rhythm must be restored as fast as possible to protect the brain and heart from  further damage. It may save a life. This may also be a scheduled procedure for irregular or fast heart rhythms that are not immediately life-threatening. Tell a health care provider about: Any allergies you have. All medicines you are taking, including vitamins, herbs, eye drops, creams, and over-the-counter medicines. Any problems you or family members have had with anesthetic medicines. Any blood disorders you have. Any surgeries you have had. Any medical conditions you have. Whether you are pregnant or may be pregnant. What are the risks? Generally, this is a safe procedure. However, problems may occur, including: Allergic reactions to medicines. A blood clot that breaks free and travels to other parts of your body. The possible return of an abnormal heart rhythm within hours or days after the procedure. Your heart stopping (cardiac arrest). This is rare. What happens before the procedure? Medicines Your health care provider may have you start taking: Blood-thinning medicines (anticoagulants) so your blood does not clot as easily. Medicines to help stabilize your heart rate and rhythm. Ask your health care provider about: Changing or stopping your regular medicines. This is especially important if  you are taking diabetes medicines or blood thinners. Taking medicines such as aspirin and ibuprofen. These medicines can thin your blood. Do not take these medicines unless your health care provider tells you to take them. Taking over-the-counter medicines, vitamins, herbs, and supplements. General instructions Follow instructions from your health care provider about eating or drinking restrictions. Plan to have someone take you home from the hospital or clinic. If you will be going home right after the procedure, plan to have someone with you for 24 hours. Ask your health care provider what steps will be taken to help prevent infection. These may include washing your skin with a  germ-killing soap. What happens during the procedure?  An IV will be inserted into one of your veins. Sticky patches (electrodes) or metal paddles may be placed on your chest. You will be given a medicine to help you relax (sedative). An electrical shock will be delivered. The procedure may vary among health care providers and hospitals. What can I expect after the procedure? Your blood pressure, heart rate, breathing rate, and blood oxygen level will be monitored until you leave the hospital or clinic. Your heart rhythm will be watched to make sure it does not change. You may have some redness on the skin where the shocks were given. Follow these instructions at home: Do not drive for 24 hours if you were given a sedative during your procedure. Take over-the-counter and prescription medicines only as told by your health care provider. Ask your health care provider how to check your pulse. Check it often. Rest for 48 hours after the procedure or as told by your health care provider. Avoid or limit your caffeine use as told by your health care provider. Keep all follow-up visits as told by your health care provider. This is important. Contact a health care provider if: You feel like your heart is beating too quickly or your pulse is not regular. You have a serious muscle cramp that does not go away. Get help right away if: You have discomfort in your chest. You are dizzy or you feel faint. You have trouble breathing or you are short of breath. Your speech is slurred. You have trouble moving an arm or leg on one side of your body. Your fingers or toes turn cold or blue. Summary Electrical cardioversion is the delivery of a jolt of electricity to restore a normal rhythm to the heart. This procedure may be done right away in an emergency or may be a scheduled procedure if the condition is not an emergency. Generally, this is a safe procedure. After the procedure, check your pulse often  as told by your health care provider. This information is not intended to replace advice given to you by your health care provider. Make sure you discuss any questions you have with your health care provider. Document Revised: 04/12/2021 Document Reviewed: 12/14/2018 Elsevier Patient Education  2023 [employer] Monitored Anesthesia Care, Care After This sheet gives you information about how to care for yourself after your procedure. Your health care provider may also give you more specific instructions. If you have problems or questions, contact your health care provider. What can I expect after the procedure? After the procedure, it is common to have: Tiredness. Forgetfulness about what happened after the procedure. Impaired judgment for important decisions. Nausea or vomiting. Some difficulty with balance. Follow these instructions at home: For the time period you were told by your health care provider:     Rest as  needed. Do not participate in activities where you could fall or become injured. Do not drive or use machinery. Do not drink alcohol. Do not take sleeping pills or medicines that cause drowsiness. Do not make important decisions or sign legal documents. Do not take care of children on your own. Eating and drinking Follow the diet that is recommended by your health care provider. Drink enough fluid to keep your urine pale yellow. If you vomit: Drink water, juice, or soup when you can drink without vomiting. Make sure you have little or no nausea before eating solid foods. General instructions Have a responsible adult stay with you for the time you are told. It is important to have someone help care for you until you are awake and alert. Take over-the-counter and prescription medicines only as told by your health care provider. If you have sleep apnea, surgery and certain medicines can increase your risk for breathing problems. Follow instructions from your health care  provider about wearing your sleep device: Anytime you are sleeping, including during daytime naps. While taking prescription pain medicines, sleeping medicines, or medicines that make you drowsy. Avoid smoking. Keep all follow-up visits as told by your health care provider. This is important. Contact a health care provider if: You keep feeling nauseous or you keep vomiting. You feel light-headed. You are still sleepy or having trouble with balance after 24 hours. You develop a rash. You have a fever. You have redness or swelling around the IV site. Get help right away if: You have trouble breathing. You have new-onset confusion at home. Summary For several hours after your procedure, you may feel tired. You may also be forgetful and have poor judgment. Have a responsible adult stay with you for the time you are told. It is important to have someone help care for you until you are awake and alert. Rest as told. Do not drive or operate machinery. Do not drink alcohol or take sleeping pills. Get help right away if you have trouble breathing, or if you suddenly become confused. This information is not intended to replace advice given to you by your health care provider. Make sure you discuss any questions you have with your health care provider. Document Revised: 04/17/2021 Document Reviewed: 04/15/2019 Elsevier Patient Education  2023 [employer].

## 2021-12-11 ENCOUNTER — Encounter (HOSPITAL_COMMUNITY)
Admission: RE | Admit: 2021-12-11 | Discharge: 2021-12-11 | Disposition: A | Discharge status: Home / Self Care | Payer: Self-pay | Source: Ambulatory Visit | Attending: Cardiology | Admitting: Cardiology

## 2021-12-11 ENCOUNTER — Encounter (HOSPITAL_COMMUNITY): Payer: Self-pay

## 2021-12-11 VITALS — BP 127/72 | HR 61 | Temp 97.8°F | Resp 18 | Ht 68.0 in | Wt 235.0 lb

## 2021-12-11 DIAGNOSIS — Z01818 Encounter for other preprocedural examination: Secondary | ICD-10-CM | POA: Insufficient documentation

## 2021-12-11 DIAGNOSIS — I480 Paroxysmal atrial fibrillation: Secondary | ICD-10-CM

## 2021-12-11 DIAGNOSIS — E876 Hypokalemia: Secondary | ICD-10-CM | POA: Insufficient documentation

## 2021-12-11 DIAGNOSIS — I4891 Unspecified atrial fibrillation: Secondary | ICD-10-CM | POA: Insufficient documentation

## 2021-12-11 HISTORY — DX: Cardiac arrhythmia, unspecified: I49.9

## 2021-12-11 HISTORY — DX: Anxiety disorder, unspecified: F41.9

## 2021-12-11 HISTORY — DX: Depression, unspecified: F32.A

## 2021-12-11 LAB — BASIC METABOLIC PANEL
Anion gap: 5 (ref 5–15)
BUN: 12 mg/dL (ref 6–20)
CO2: 23 mmol/L (ref 22–32)
Calcium: 8.8 mg/dL — ABNORMAL LOW (ref 8.9–10.3)
Chloride: 110 mmol/L (ref 98–111)
Creatinine, Ser: 0.75 mg/dL (ref 0.61–1.24)
GFR, Estimated: >60 mL/min (ref >60)
Glucose, Bld: 131 mg/dL — ABNORMAL HIGH (ref 70–99)
Potassium: 3.9 mmol/L (ref 3.5–5.1)
Sodium: 138 mmol/L (ref 135–145)

## 2021-12-12 ENCOUNTER — Ambulatory Visit (HOSPITAL_COMMUNITY)
Admission: RE | Admit: 2021-12-12 | Discharge: 2021-12-12 | Disposition: A | Discharge status: Home / Self Care | Payer: Self-pay | Attending: Cardiology | Admitting: Cardiology

## 2021-12-12 ENCOUNTER — Encounter (HOSPITAL_COMMUNITY): Payer: Self-pay | Admitting: Cardiology

## 2021-12-12 ENCOUNTER — Ambulatory Visit (HOSPITAL_BASED_OUTPATIENT_CLINIC_OR_DEPARTMENT_OTHER): Payer: Self-pay | Admitting: Anesthesiology

## 2021-12-12 ENCOUNTER — Ambulatory Visit (HOSPITAL_COMMUNITY): Payer: Self-pay | Admitting: Anesthesiology

## 2021-12-12 ENCOUNTER — Encounter (HOSPITAL_COMMUNITY)
Admission: RE | Disposition: A | Discharge status: Home / Self Care | Payer: Self-pay | Source: Home / Self Care | Attending: Cardiology

## 2021-12-12 DIAGNOSIS — I4819 Other persistent atrial fibrillation: Secondary | ICD-10-CM | POA: Insufficient documentation

## 2021-12-12 DIAGNOSIS — I5022 Chronic systolic (congestive) heart failure: Secondary | ICD-10-CM

## 2021-12-12 DIAGNOSIS — D696 Thrombocytopenia, unspecified: Secondary | ICD-10-CM | POA: Insufficient documentation

## 2021-12-12 DIAGNOSIS — F17210 Nicotine dependence, cigarettes, uncomplicated: Secondary | ICD-10-CM | POA: Insufficient documentation

## 2021-12-12 DIAGNOSIS — I480 Paroxysmal atrial fibrillation: Secondary | ICD-10-CM

## 2021-12-12 DIAGNOSIS — Z7901 Long term (current) use of anticoagulants: Secondary | ICD-10-CM | POA: Insufficient documentation

## 2021-12-12 DIAGNOSIS — Z79899 Other long term (current) drug therapy: Secondary | ICD-10-CM | POA: Insufficient documentation

## 2021-12-12 DIAGNOSIS — K219 Gastro-esophageal reflux disease without esophagitis: Secondary | ICD-10-CM | POA: Insufficient documentation

## 2021-12-12 DIAGNOSIS — F418 Other specified anxiety disorders: Secondary | ICD-10-CM

## 2021-12-12 DIAGNOSIS — I110 Hypertensive heart disease with heart failure: Secondary | ICD-10-CM | POA: Insufficient documentation

## 2021-12-12 DIAGNOSIS — I5032 Chronic diastolic (congestive) heart failure: Secondary | ICD-10-CM | POA: Insufficient documentation

## 2021-12-12 DIAGNOSIS — F419 Anxiety disorder, unspecified: Secondary | ICD-10-CM | POA: Insufficient documentation

## 2021-12-12 HISTORY — PX: CARDIOVERSION: SHX1299

## 2021-12-12 SURGERY — CARDIOVERSION
Anesthesia: General

## 2021-12-12 MED ORDER — CHLORHEXIDINE GLUCONATE 0.12 % MT SOLN: 15.0000 mL | Freq: Once | OROMUCOSAL | Status: DC

## 2021-12-12 MED ORDER — ORAL CARE MOUTH RINSE: 15.0000 mL | Freq: Once | OROMUCOSAL | Status: DC

## 2021-12-12 MED ORDER — PROPOFOL 10 MG/ML IV BOLUS
INTRAVENOUS | Status: DC | PRN
  Administered 2021-12-12: 80 mg via INTRAVENOUS
  Administered 2021-12-12: 40 mg via INTRAVENOUS

## 2021-12-12 MED ORDER — LACTATED RINGERS IV SOLN
INTRAVENOUS | Status: DC
Start: 2021-12-12 — End: 2021-12-12

## 2021-12-12 MED ORDER — LIDOCAINE 2% (20 MG/ML) 5 ML SYRINGE
INTRAMUSCULAR | Status: DC | PRN
  Administered 2021-12-12: 100 mg via INTRAVENOUS

## 2021-12-12 NOTE — Anesthesia Postprocedure Evaluation (Signed)
Anesthesia Post Note  Patient: Omar Mccarthy  Procedure(s) Performed: CARDIOVERSION  Patient location during evaluation: Phase II Anesthesia Type: General Level of consciousness: awake and alert and oriented Pain management: pain level controlled Vital Signs Assessment: post-procedure vital signs reviewed and stable Respiratory status: spontaneous breathing, nonlabored ventilation and respiratory function stable Cardiovascular status: blood pressure returned to baseline and stable Postop Assessment: no apparent nausea or vomiting Anesthetic complications: no   No notable events documented.   Last Vitals:  Vitals:   12/12/21 1115 12/12/21 1130  BP: 129/80 114/70  Pulse: (!) 52 (!) 54  Resp: 15 15  Temp:    SpO2: 100% 100%    Last Pain:  Vitals:   12/12/21 1130  TempSrc:   PainSc: 0-No pain                 Omar Mccarthy

## 2021-12-12 NOTE — Transfer of Care (Signed)
Immediate Anesthesia Transfer of Care Note  Patient: Omar Mccarthy  Procedure(s) Performed: CARDIOVERSION  Patient Location: PACU  Anesthesia Type:MAC  Level of Consciousness: drowsy and patient cooperative  Airway & Oxygen Therapy: Patient Spontanous Breathing and Patient connected to nasal cannula oxygen  Post-op Assessment: Report given to RN and Post -op Vital signs reviewed and stable  Post vital signs: Reviewed and stable  Last Vitals:  Vitals Value Taken Time  BP 122/76   Temp    Pulse 63   Resp 14   SpO2 100%     Last Pain:  Vitals:   12/12/21 1019  TempSrc: Oral  PainSc: 0-No pain      Patients Stated Pain Goal: 8 (78/41/28 2081)  Complications: No notable events documented.

## 2021-12-12 NOTE — CV Procedure (Signed)
CV procedure note  CV procedure: electrical cardioversion Indication: persistent atrial fibrillation Physician: Dr Dina Rich MD   Patient presented for electrical cardioversion in setting of persistent afib. He is on eliquis at home with compliance. Defib pads placed anterior and posterior positions. Succesfully converted from afib to SR with a single synchronized 200j shock. Cardiopulmonary monitoring performed throughout procedure, tolerated well without complications  Would continue diltiazem, discontinue his digoxin.   Dina Rich MD

## 2021-12-12 NOTE — Anesthesia Preprocedure Evaluation (Signed)
Anesthesia Evaluation  Patient identified by MRN, date of birth, ID band Patient awake    Reviewed: Allergy & Precautions, NPO status , Patient's Chart, lab work & pertinent test results  Airway Mallampati: II  TM Distance: >3 FB Neck ROM: Full    Dental  (+) Dental Advisory Given, Teeth Intact   Pulmonary Current Smoker and Patient abstained from smoking.,  Polysubstance abuse (HCC) Respiratory failure requiring intubation (HCC)     Pulmonary exam normal breath sounds clear to auscultation       Cardiovascular hypertension, Pt. on medications +CHF  + dysrhythmias Atrial Fibrillation  Rhythm:Irregular Rate:Tachycardia  1. Right ventricular systolic function is normal. The right ventricular size is normal.  2. The aortic valve has an indeterminant number of cusps.  3. LVEF 65-70%. Difficult evaluation of LVOT gradient based on available  Dopplers and in the setting of afib with RVR. Consider repeat study when  rates better controlled.    Neuro/Psych PSYCHIATRIC DISORDERS Anxiety Depression negative neurological ROS     GI/Hepatic GERD (no symptoms of GERD today)  ,(+)     substance abuse (h/o alcohol abuse)  alcohol use, Hepatitis -  Endo/Other  negative endocrine ROS  Renal/GU negative Renal ROS  negative genitourinary   Musculoskeletal negative musculoskeletal ROS (+)   Abdominal   Peds negative pediatric ROS (+)  Hematology  (+) Blood dyscrasia (thrombocytopenia), anemia ,   Anesthesia Other Findings Polysubstance abuse (HCC) Respiratory failure requiring intubation (HCC)    Reproductive/Obstetrics negative OB ROS                             Anesthesia Physical Anesthesia Plan  ASA: 3  Anesthesia Plan: General   Post-op Pain Management: Minimal or no pain anticipated   Induction: Intravenous  PONV Risk Score and Plan: TIVA  Airway Management Planned: Nasal Cannula  and Natural Airway  Additional Equipment:   Intra-op Plan:   Post-operative Plan:   Informed Consent: I have reviewed the patients History and Physical, chart, labs and discussed the procedure including the risks, benefits and alternatives for the proposed anesthesia with the patient or authorized representative who has indicated his/her understanding and acceptance.     Dental advisory given  Plan Discussed with: CRNA and Surgeon  Anesthesia Plan Comments:         Anesthesia Quick Evaluation

## 2021-12-12 NOTE — Progress Notes (Signed)
Electrical Cardioversion Procedure Note Omar Mccarthy [id number] 22-Oct-1990  Procedure: Electrical Cardioversion Indications:  Atrial Fibrillation  Procedure Details Consent: Risks of procedure as well as the alternatives and risks of each were explained to the (patient/caregiver).  Consent for procedure obtained. Time Out: Verified patient identification, verified procedure, site/side was marked, verified correct patient position, special equipment/implants available, medications/allergies/relevent history reviewed, required imaging and test results available.  Performed  Patient placed on cardiac monitor, pulse oximetry, supplemental oxygen as necessary.  Sedation given: Short-acting barbiturates given by CRNA Lelon Mast. Pacer pads placed anterior and posterior chest. Placed and verified by Dr.Branch.  Cardioverted 1 time(s).  Cardioverted at 200J.  Evaluation Findings: Post procedure EKG shows: NSR Complications: None Patient did tolerate procedure well.   Sherrye Payor 12/12/2021, 11:28 AM

## 2021-12-12 NOTE — H&P (Signed)
Procedure H&P   Patient presents for outpatient electrical cardioversion in the setting of persistent atrial fibrillation. He has been on eliquis at home for anticoagulation. For full medical history please refer to referenced recent clinic note below. Plan for electrical cardioversion today with the assistance of anesthesiology  Dina Rich MD    Cardiology Office Note     Date:  11/10/2021    ID:  Omar Mccarthy, DOB 1990-08-30, MRN [id number]   PCP:  Pcp, No  Cardiologist: Dina Rich, MD        Chief Complaint  Patient presents with   Hospitalization Follow-up      History of Present Illness:     Omar Mccarthy is a 31 y.o. 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    He most recently presented back to [facility] ED on 10/23/2021 for evaluation of worsening palpitations and was found to be in atrial fibrillation with RVR.  He did report missing doses of Toprol-XL and Digoxin and had been off Eliquis for 6+ months. Was also consuming a significant amount of alcohol including a half a gallon of liquor every 1 to 2 days. It was recommended to pursue rate control at that time and if he was compliant with anticoagulation, could consider DCCV as an outpatient. His platelet count did drop during admission with noted thrombocytopenia at 43 K. Therefore, Eliquis was discontinued and was recommended to recheck a CBC as an outpatient and if platelet count was above 50 K, would restart Eliquis. Otherwise, would consider a rate control strategy. He was transitioned to Digoxin 0.125 mg daily, Toprol-XL 100 mg daily and Cardizem CD 120mg  daily at discharge.   In talking with the patient today, he reports much improvement in  his symptoms since his hospitalization but still feels fatigued when in atrial fibrillation. His heart rate is well controlled in the 60's today. In reviewing his medication list, he reports he has been taking Cardizem CD and Digoxin but did not continue Toprol-XL as he thought this was discontinued. Says his breathing has been stable with no specific orthopnea, PND or pitting edema. Reports no longer consuming alcohol since hospital discharge.  Repeat CBC today showed his platelet count has improved to 308 K.         Past Medical History:  Diagnosis Date   Atrial fibrillation (HCC)      a. diagnosed in 12/2019 with successful DCCV in 01/2020 b. recurrence in 09/2021 in the setting of significant alcohol use   Hypertension             Past Surgical History:  Procedure Laterality Date   CARDIOVERSION N/A 02/24/2020    Procedure: CARDIOVERSION;  Surgeon: 02/26/2020, MD;  Location: [facility];  Service: Cardiovascular;  Laterality: N/A;      Current Medications:       Outpatient Medications Prior to Visit  Medication Sig Dispense Refill   digoxin (LANOXIN) 0.125 MG tablet Take 1 tablet (0.125 mg total) by mouth daily. 30 tablet 4   diltiazem (CARDIZEM CD) 120 MG 24 hr capsule Take 1 capsule (120 mg total) by mouth daily. 30 capsule 11   metoprolol succinate (TOPROL-XL) 100 MG 24 hr tablet Take 1 tablet (100 mg total) by mouth  daily. 30 tablet 3   apixaban (ELIQUIS) 5 MG TABS tablet Take 1 tablet (5 mg total) by mouth 2 (two) times daily. Do not start until platelet count is over 50 K (Patient not taking: Reported on 11/09/2021) 60 tablet 2   Multiple Vitamin (MULTIVITAMIN WITH MINERALS) TABS tablet Take 1 tablet by mouth daily. (Patient not taking: Reported on 10/23/2021) 30 tablet 2   nicotine (NICODERM CQ - DOSED IN MG/24 HOURS) 21 mg/24hr patch Place 1 patch (21 mg total) onto the skin daily. (Patient not taking: Reported on 11/09/2021) 28 patch 0   pantoprazole (PROTONIX) 40 MG  tablet Take 1 tablet (40 mg total) by mouth daily. (Patient not taking: Reported on 11/09/2021) 30 tablet 3   thiamine 100 MG tablet Take 1 tablet (100 mg total) by mouth daily. (Patient not taking: Reported on 11/09/2021) 30 tablet 3   chlordiazePOXIDE (LIBRIUM) 10 MG capsule Take 1 capsule (10 mg total) by mouth See admin instructions. Take 2 tablets 3 times a day for 2 days, then 1 tablet 3 times a day for 2 days, then 1 tablet twice daily for 2 days, then 1 tablet daily for 2 days for prevention of alcohol withdrawal symptoms 30 capsule 0    No facility-administered medications prior to visit.      Allergies:   Patient has no known allergies.    Social History         Socioeconomic History   Marital status: Married      Spouse name: Not on file   Number of children: Not on file   Years of education: Not on file   Highest education level: Not on file  Occupational History   Not on file  Tobacco Use   Smoking status: Every Day      Packs/day: 1.00      Types: Cigarettes   Smokeless tobacco: Never  Vaping Use   Vaping Use: Never used  Substance and Sexual Activity   Alcohol use: Yes      Comment: pt states "I drink 1 gallon of vodka every 2 days"   Drug use: Not Currently      Types: Methamphetamines, Marijuana      Comment: Prior marijuana.  intermittent use of vicodin (not prescribed). Meth 01/2021   Sexual activity: Yes  Other Topics Concern   Not on file  Social History Narrative   Not on file    Social Determinants of Health    Financial Resource Strain: Not on file  Food Insecurity: Not on file  Transportation Needs: Not on file  Physical Activity: Not on file  Stress: Not on file  Social Connections: Not on file      Family History:  The patient's family history includes Colon cancer in his mother; High Cholesterol in his father and mother; High blood pressure in his father and mother.    Review of Systems:     Please see the history of present illness.       All other systems reviewed and are otherwise negative except as noted above.     Physical Exam:     VS:  BP 140/82   Pulse 62   Ht 5\' 8"  (1.727 m)   Wt 235 lb (106.6 kg)   BMI 35.73 kg/m    General: Well developed, well nourished,male appearing in no acute distress. Head: Normocephalic, atraumatic. Neck: No carotid bruits. JVD not elevated.  Lungs: Respirations regular and unlabored, without wheezes or rales.  Heart: Irregularly irregular.  No S3 or S4.  No murmur, no rubs, or gallops appreciated. Abdomen: Appears non-distended. No obvious abdominal masses. Msk:  Strength and tone appear normal for age. No obvious joint deformities or effusions. Extremities: No clubbing or cyanosis. No pitting edema.  Distal pedal pulses are 2+ bilaterally. Neuro: Alert and oriented X 3. Moves all extremities spontaneously. No focal deficits noted. Psych:  Responds to questions appropriately with a normal affect. Skin: No rashes or lesions noted      Wt Readings from Last 3 Encounters:  11/09/21 235 lb (106.6 kg)  10/25/21 235 lb 3.7 oz (106.7 kg)  05/22/21 227 lb 9.6 oz (103.2 kg)      Studies/Labs Reviewed:    EKG:  EKG is ordered today.  The ekg ordered today demonstrates rate controlled atrial fibrillation, heart rate 62.   Recent Labs: 04/09/2021: B Natriuretic Peptide 58.0 04/14/2021: TSH 3.031 10/24/2021: Magnesium 2.0 10/25/2021: ALT 48; BUN 7; Creatinine, Ser 0.35; Potassium 2.8; Sodium 137 11/09/2021: Hemoglobin 14.1; Platelets 308    Lipid Panel Labs (Brief)          Component Value Date/Time    TRIG 149 01/16/2020 0523        Additional studies/ records that were reviewed today include:    Limited Echo: 10/24/2021 IMPRESSIONS     1. Right ventricular systolic function is normal. The right ventricular  size is normal.   2. The aortic valve has an indeterminant number of cusps.   3.      LVEF 65-70%. Difficult evaluation of LVOT gradient based on available  Dopplers  and in the setting of afib with RVR. Consider repeat study when  rates better controlled.    Assessment:     1. Persistent atrial fibrillation (HCC)   2. Thrombocytopenia (HCC)   3. Alcohol abuse         Plan:    In order of problems listed above:   1. Persistent Atrial Fibrillation - Was recently diagnosed with recurrent atrial fibrillation in the setting of recurrent alcohol use. He has since quit consuming alcohol and his platelet count has now improved which allows for the use of anticoagulation. Will plan to start Eliquis 5 mg twice daily and anticipate DCCV in 3 weeks if still in atrial fibrillation. Patient assistance information provided for Eliquis along with samples. I encouraged him to make [place] aware if he misses a dose in the interim. Risks and benefits of DCCV reviewed and he wishes to proceed as he says he feels much better when in normal sinus rhythm. - He has only been taking Digoxin 0.125 mg daily and Cardizem CD 120 mg daily. Given his heart rate in the 60's today, I did not recommend restarting Toprol-XL at this time.   2. Thrombocytopenia - His platelet count was down to 43 K during admission but has since normalized to 308 K today. No reports of active bleeding. Will start anticoagulation as outlined above.    3. Alcohol Use - He denies any recurrent alcohol use since his recent admission. He was strongly advised to avoid alcohol as this appears to be the recurrent trigger for his arrhythmia over the past few years.     Shared Decision Making/Informed Consent:    Shared Decision Making/Informed Consent The risks (stroke, cardiac arrhythmias rarely resulting in the need for a temporary or permanent pacemaker, skin irritation or burns and complications associated with conscious sedation including aspiration, arrhythmia, respiratory failure and death), benefits (restoration of normal sinus rhythm) and alternatives  of a direct current cardioversion were explained in  detail to Omar Mccarthy and he agrees to proceed.        Medication Adjustments/Labs and Tests Ordered: Current medicines are reviewed at length with the patient today.  Concerns regarding medicines are outlined above.  Medication changes, Labs and Tests ordered today are listed in the Patient Instructions below. Patient Instructions  Medication Instructions:    Re-start Elquis 5 mg twice a day     I have given you 3 week samples       Please complete patient assistance form and return so we can get your medication approved quickly       Labwork: None today   Testing/Procedures: Tentative cardioversion Tuesday 12/04/21. We will call you with exact date.    Follow-Up: 1 month after cardioversion   Any Other Special Instructions Will Be Listed Below (If Applicable).   If you need a refill on your cardiac medications before your next appointment, please call your pharmacy.     Signed, Ellsworth Lennox, PA-C  11/10/2021 5:15 PM    [facility] [address] [place], [place] [address] Phone: [phone] Fax: [phone]

## 2021-12-13 ENCOUNTER — Encounter (HOSPITAL_COMMUNITY): Payer: Self-pay | Admitting: Cardiology

## 2021-12-17 ENCOUNTER — Telehealth: Payer: Self-pay | Admitting: Cardiology

## 2021-12-17 NOTE — Telephone Encounter (Signed)
Spoke to pt who verbalized understanding. Pt had no other questions or concerns at this time.

## 2021-12-17 NOTE — Telephone Encounter (Signed)
1. Persistent Atrial Fibrillation - Was recently diagnosed with recurrent atrial fibrillation in the setting of recurrent alcohol use. He has since quit consuming alcohol and his platelet count has now improved which allows for the use of anticoagulation. Will plan to start Eliquis 5 mg twice daily and anticipate DCCV in 3 weeks if still in atrial fibrillation. Patient assistance information provided for Eliquis along with samples. I encouraged him to make [place] aware if he misses a dose in the interim. Risks and benefits of DCCV reviewed and he wishes to proceed as he says he feels much better when in normal sinus rhythm. - He has only been taking Digoxin 0.125 mg daily and Cardizem CD 120 mg daily. Given his heart rate in the 60's today, I did not recommend restarting Toprol-XL at this time.   I do not see where he is to stop Digoxin. Please advise.

## 2021-12-17 NOTE — Telephone Encounter (Signed)
    Yes, he should STOP Digoxin and continue Cardizem and Eliquis as listed in the CV Procedure note from 7/19 and the AVS from that day.   Thanks,  [place]

## 2021-12-17 NOTE — Telephone Encounter (Signed)
Patient had a cardioversion last week and was told to come off of digoxin.  He wants to make sure that is the correct medication he should be off of.

## 2021-12-19 ENCOUNTER — Telehealth: Payer: Self-pay | Admitting: Cardiology

## 2021-12-19 NOTE — Telephone Encounter (Signed)
Returned call to pt's wife. No answer. Left msg to call back.  

## 2021-12-19 NOTE — Telephone Encounter (Signed)
Wife called stating patient is wanting information on how to get financial assistance for Eliquis.

## 2021-12-20 NOTE — Telephone Encounter (Signed)
Wife informed we are awaiting response from patient assistance. She states they found a bottle of Eliquis in their home and he has approx 1 month left of Eliquis.

## 2021-12-24 ENCOUNTER — Telehealth: Payer: Self-pay | Admitting: *Deleted

## 2021-12-24 NOTE — Telephone Encounter (Signed)
Call to notify pt that he has been approved for the [employer] ( Eliquis ) patient assistance program through 05/26/22. No answer. Left msg to call back.

## 2022-01-02 ENCOUNTER — Telehealth: Payer: Self-pay | Admitting: Cardiology

## 2022-01-02 NOTE — Telephone Encounter (Signed)
  Patient states he is returning a call from Friday 

## 2022-01-02 NOTE — Telephone Encounter (Signed)
Pt notified that he was approved for Eliquis patient assistance.

## 2022-01-08 NOTE — Progress Notes (Unsigned)
Cardiology Office Note    Date:  01/09/2022   ID:  [name], DOB 1990-06-09, MRN [id number]  PCP:  Oneita Hurt, No  Cardiologist: Dina Rich, MD    Chief Complaint  Patient presents with   Follow-up    Recent DCCV    History of Present Illness:    Omar Mccarthy is a 31 y.o. 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   He was last examined by myself in 10/2021 following a recent admission for atrial fibrillation with RVR in the setting of consuming a significant amount of alcohol. At the time of his follow-up visit, his rates were well controlled and he denied any symptoms at that time. He denied any recurrent alcohol use and given that his platelet count had normalized, he was started on Eliquis for anticoagulation with plans for a repeat DCCV following 3 weeks of anticoagulation. He did undergo successful DCCV by Dr. Wyline Mood on 12/12/2021 and converted to normal sinus rhythm with a single synchronized 200 J shock. He was continued on Cardizem CD 120mg  daily with Digoxin being discontinued following the procedure.  In talking with the patient today, he reports overall doing well since his recent cardioversion. Says that his intermittent dizziness has mostly resolved and he has noticed an improvement in his energy level. No recent chest pain, palpitations, orthopnea, PND or pitting edema. He is on Eliquis with no reports of active bleeding.  He does consume a significant amount of sodas (typically ~ 5 Pepsi cans a day) but is planning to try to reduce this in hopes of losing weight. He denies any recurrent alcohol use since his hospitalization earlier this year.   Past Medical History:   Diagnosis Date   Anxiety    Atrial fibrillation (HCC)    a. diagnosed in 12/2019 with successful DCCV in 01/2020 b. recurrence in 09/2021 in the setting of significant alcohol use and successful DCCV in 11/2021   Depression    Dysrhythmia    Hypertension     Past Surgical History:  Procedure Laterality Date   CARDIOVERSION N/A 02/24/2020   Procedure: CARDIOVERSION;  Surgeon: 02/26/2020, MD;  Location: [facility];  Service: Cardiovascular;  Laterality: N/A;   CARDIOVERSION N/A 12/12/2021   Procedure: CARDIOVERSION;  Surgeon: 12/14/2021, MD;  Location: [facility] ORS;  Service: Endoscopy;  Laterality: N/A;    Current Medications: Outpatient Medications Prior to Visit  Medication Sig Dispense Refill   acetaminophen (TYLENOL) 500 MG tablet Take 1,000 mg by mouth every 6 (six) hours as needed for moderate pain.     apixaban (ELIQUIS) 5 MG TABS tablet Take 1 tablet (5 mg total) by mouth 2 (two) times daily. 42 tablet 0   buprenorphine-naloxone (SUBOXONE) 8-2 mg SUBL SL tablet Place 1 tablet under the tongue daily.     diltiazem (CARDIZEM CD) 120 MG 24 hr capsule Take 1 capsule (120 mg total) by mouth daily. 30 capsule 11   Multiple Vitamin (MULTIVITAMIN WITH MINERALS) TABS tablet Take 1 tablet by mouth daily. (Patient not taking: Reported on 10/23/2021) 30 tablet 2   nicotine (NICODERM CQ - DOSED IN MG/24 HOURS) 21 mg/24hr patch Place 1 patch (21 mg total) onto the skin daily. (Patient  not taking: Reported on 11/09/2021) 28 patch 0   pantoprazole (PROTONIX) 40 MG tablet Take 1 tablet (40 mg total) by mouth daily. (Patient not taking: Reported on 11/09/2021) 30 tablet 3   thiamine 100 MG tablet Take 1 tablet (100 mg total) by mouth daily. (Patient not taking: Reported on 11/09/2021) 30 tablet 3   No facility-administered medications prior to visit.     Allergies:   Patient has no known allergies.   Social History   Socioeconomic History   Marital status: Married    Spouse name:  Not on file   Number of children: Not on file   Years of education: Not on file   Highest education level: Not on file  Occupational History   Not on file  Tobacco Use   Smoking status: Every Day    Packs/day: 1.00    Types: Cigarettes   Smokeless tobacco: Never  Vaping Use   Vaping Use: Never used  Substance and Sexual Activity   Alcohol use: Not Currently    Comment: pt states "I drink 1 gallon of vodka every 2 days"   Drug use: Not Currently    Comment: Prior marijuana.  intermittent use of vicodin (not prescribed). 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   Review of Systems:    Please see the history of present illness.     All other systems reviewed and are otherwise negative except as noted above.   Physical Exam:    VS:  BP 130/78   Pulse 67   Ht 5\' 9"  (1.753 m)   Wt 249 lb 6.4 oz (113.1 kg)   SpO2 98%   BMI 36.83 kg/m    General: Pleasant male appearing in no acute distress. Head: Normocephalic, atraumatic. Neck: No carotid bruits. JVD not elevated.  Lungs: Respirations regular and unlabored, without wheezes or rales.  Heart: Regular rate and rhythm. No S3 or S4.  No murmur, no rubs, or gallops appreciated. Abdomen: Appears non-distended. No obvious abdominal masses. Msk:  Strength and tone appear normal for age. No obvious joint deformities or effusions. Extremities: No clubbing or cyanosis. No pitting edema.  Distal pedal pulses are 2+ bilaterally. Neuro: Alert and oriented X 3. Moves  all extremities spontaneously. No focal deficits noted. Psych:  Responds to questions appropriately with a normal affect. Skin: No rashes or lesions noted  Wt Readings from Last 3 Encounters:  01/09/22 249 lb 6.4 oz (113.1 kg)  12/12/21 235 lb 0.2 oz (106.6 kg)  12/11/21 235 lb 0.2 oz (106.6 kg)     Studies/Labs Reviewed:   EKG:  EKG is ordered today.  The ekg ordered today demonstrates NSR, HR 69 with no acute ST changes.   Recent Labs: 04/09/2021: B Natriuretic Peptide 58.0 04/14/2021: TSH 3.031 10/24/2021: Magnesium 2.0 10/25/2021: ALT 48 11/09/2021: Hemoglobin 14.1; Platelets 308 12/11/2021: BUN 12; Creatinine, Ser 0.75; Potassium 3.9; Sodium 138   Lipid Panel    Component Value Date/Time  TRIG 149 01/16/2020 0523    Additional studies/ records that were reviewed today include:   Limited Echocardiogram: 10/24/2021 IMPRESSIONS     1. Right ventricular systolic function is normal. The right ventricular  size is normal.   2. The aortic valve has an indeterminant number of cusps.   3.      LVEF 65-70%. Difficult evaluation of LVOT gradient based on available  Dopplers and in the setting of afib with RVR. Consider repeat study when  rates better controlled.   Assessment:    1. Persistent atrial fibrillation (HCC)   2. Thrombocytopenia (HCC)   3. History of alcohol abuse      Plan:   In order of problems listed above:  1. Persistent Atrial Fibrillation - He underwent successful DCCV on 12/12/2021 and is maintaining normal sinus rhythm by examination and EKG today. Will continue Cardizem CD 120 mg daily as HR and BP remain well controlled. - He has been approved for patient assistance with Eliquis and has been tolerating well. Given his multiple atrial fibrillation recurrences, reviewed risks and benefits with the patient and will continue this for now. If he is overall doing well at his 64-month follow-up and has not had recurrence, could possibly discontinue at that  time.  2. Thrombocytopenia - Platelet count was previously down to 43 K in 10/2021 in the setting of alcohol abuse. This had normalized to 308 K by repeat labs in 10/2021. No reports of active bleeding.  3.  History of Alcohol Abuse - He did quit consuming alcohol following his hospitalization earlier this year and denies any recurrent use.  He was congratulated on this!   Medication Adjustments/Labs and Tests Ordered: Current medicines are reviewed at length with the patient today.  Concerns regarding medicines are outlined above.  Medication changes, Labs and Tests ordered today are listed in the Patient Instructions below. Patient Instructions  Medication Instructions:  Your physician recommends that you continue on your current medications as directed. Please refer to the Current Medication list given to you today.  *If you need a refill on your cardiac medications before your next appointment, please call your pharmacy*   Lab Work: NONE   If you have labs (blood work) drawn today and your tests are completely normal, you will receive your results only by: MyChart Message (if you have MyChart) OR A paper copy in the mail If you have any lab test that is abnormal or we need to change your treatment, we will call you to review the results.   Testing/Procedures: NONE    Follow-Up: At [facility], you and your health needs are our priority.  As part of our continuing mission to provide you with exceptional heart care, we have created designated Provider Care Teams.  These Care Teams include your primary Cardiologist (physician) and Advanced Practice Providers (APPs -  Physician Assistants and Nurse Practitioners) who all work together to provide you with the care you need, when you need it.  We recommend signing up for the patient portal called "MyChart".  Sign up information is provided on this After Visit Summary.  MyChart is used to connect with patients for Virtual Visits  (Telemedicine).  Patients are able to view lab/test results, encounter notes, upcoming appointments, etc.  Non-urgent messages can be sent to your provider as well.   To learn more about what you can do with MyChart, go to [email or web address].    Your next appointment:   6 month(s)  The format for your  next appointment:   In Person  Provider:   Dina Rich, MD    Other Instructions Thank you for choosing [facility]!    Important Information About Sugar         Signed, Ellsworth Lennox, PA-C  01/09/2022 4:32 PM    [facility] [address] [place], [place] [address] Phone: [phone] Fax: [phone]

## 2022-01-09 ENCOUNTER — Encounter: Payer: Self-pay | Admitting: Student

## 2022-01-09 ENCOUNTER — Ambulatory Visit (INDEPENDENT_AMBULATORY_CARE_PROVIDER_SITE_OTHER): Payer: Self-pay | Admitting: Student

## 2022-01-09 VITALS — BP 130/78 | HR 67 | Ht 69.0 in | Wt 249.4 lb

## 2022-01-09 DIAGNOSIS — I4819 Other persistent atrial fibrillation: Secondary | ICD-10-CM

## 2022-01-09 DIAGNOSIS — F1011 Alcohol abuse, in remission: Secondary | ICD-10-CM

## 2022-01-09 DIAGNOSIS — D696 Thrombocytopenia, unspecified: Secondary | ICD-10-CM

## 2022-01-09 NOTE — Patient Instructions (Signed)
Medication Instructions:  Your physician recommends that you continue on your current medications as directed. Please refer to the Current Medication list given to you today.  *If you need a refill on your cardiac medications before your next appointment, please call your pharmacy*   Lab Work: NONE   If you have labs (blood work) drawn today and your tests are completely normal, you will receive your results only by: MyChart Message (if you have MyChart) OR A paper copy in the mail If you have any lab test that is abnormal or we need to change your treatment, we will call you to review the results.   Testing/Procedures: NONE    Follow-Up: At [facility], you and your health needs are our priority.  As part of our continuing mission to provide you with exceptional heart care, we have created designated Provider Care Teams.  These Care Teams include your primary Cardiologist (physician) and Advanced Practice Providers (APPs -  Physician Assistants and Nurse Practitioners) who all work together to provide you with the care you need, when you need it.  We recommend signing up for the patient portal called "MyChart".  Sign up information is provided on this After Visit Summary.  MyChart is used to connect with patients for Virtual Visits (Telemedicine).  Patients are able to view lab/test results, encounter notes, upcoming appointments, etc.  Non-urgent messages can be sent to your provider as well.   To learn more about what you can do with MyChart, go to [email or web address].    Your next appointment:   6 month(s)  The format for your next appointment:   In Person  Provider:   Jonathan Branch, MD    Other Instructions Thank you for choosing [facility]!    Important Information About Sugar       

## 2022-01-10 ENCOUNTER — Ambulatory Visit: Payer: Medicaid Other | Admitting: Student

## 2022-03-22 ENCOUNTER — Emergency Department (HOSPITAL_COMMUNITY): Payer: Self-pay

## 2022-03-22 ENCOUNTER — Inpatient Hospital Stay (HOSPITAL_COMMUNITY)
Admission: EM | Admit: 2022-03-22 | Discharge: 2022-03-25 | DRG: 309 | Disposition: A | Discharge status: Home / Self Care | Payer: Self-pay | Attending: Internal Medicine | Admitting: Internal Medicine

## 2022-03-22 ENCOUNTER — Other Ambulatory Visit: Payer: Self-pay

## 2022-03-22 DIAGNOSIS — Z91148 Patient's other noncompliance with medication regimen for other reason: Secondary | ICD-10-CM

## 2022-03-22 DIAGNOSIS — F10239 Alcohol dependence with withdrawal, unspecified: Secondary | ICD-10-CM | POA: Diagnosis present

## 2022-03-22 DIAGNOSIS — Z91128 Patient's intentional underdosing of medication regimen for other reason: Secondary | ICD-10-CM

## 2022-03-22 DIAGNOSIS — K2920 Alcoholic gastritis without bleeding: Secondary | ICD-10-CM | POA: Diagnosis present

## 2022-03-22 DIAGNOSIS — R7401 Elevation of levels of liver transaminase levels: Secondary | ICD-10-CM | POA: Diagnosis present

## 2022-03-22 DIAGNOSIS — I10 Essential (primary) hypertension: Secondary | ICD-10-CM | POA: Diagnosis present

## 2022-03-22 DIAGNOSIS — Z79899 Other long term (current) drug therapy: Secondary | ICD-10-CM

## 2022-03-22 DIAGNOSIS — Z720 Tobacco use: Secondary | ICD-10-CM | POA: Diagnosis present

## 2022-03-22 DIAGNOSIS — F17210 Nicotine dependence, cigarettes, uncomplicated: Secondary | ICD-10-CM | POA: Diagnosis present

## 2022-03-22 DIAGNOSIS — K219 Gastro-esophageal reflux disease without esophagitis: Secondary | ICD-10-CM | POA: Diagnosis present

## 2022-03-22 DIAGNOSIS — Z7901 Long term (current) use of anticoagulants: Secondary | ICD-10-CM

## 2022-03-22 DIAGNOSIS — F419 Anxiety disorder, unspecified: Secondary | ICD-10-CM | POA: Diagnosis present

## 2022-03-22 DIAGNOSIS — F10129 Alcohol abuse with intoxication, unspecified: Secondary | ICD-10-CM | POA: Diagnosis present

## 2022-03-22 DIAGNOSIS — T461X6A Underdosing of calcium-channel blockers, initial encounter: Secondary | ICD-10-CM | POA: Diagnosis present

## 2022-03-22 DIAGNOSIS — Z6835 Body mass index (BMI) 35.0-35.9, adult: Secondary | ICD-10-CM

## 2022-03-22 DIAGNOSIS — F10229 Alcohol dependence with intoxication, unspecified: Secondary | ICD-10-CM | POA: Diagnosis present

## 2022-03-22 DIAGNOSIS — D696 Thrombocytopenia, unspecified: Secondary | ICD-10-CM | POA: Diagnosis present

## 2022-03-22 DIAGNOSIS — Z28310 Unvaccinated for covid-19: Secondary | ICD-10-CM

## 2022-03-22 DIAGNOSIS — I4819 Other persistent atrial fibrillation: Principal | ICD-10-CM | POA: Diagnosis present

## 2022-03-22 DIAGNOSIS — I4891 Unspecified atrial fibrillation: Principal | ICD-10-CM

## 2022-03-22 DIAGNOSIS — F1090 Alcohol use, unspecified, uncomplicated: Secondary | ICD-10-CM

## 2022-03-22 DIAGNOSIS — E669 Obesity, unspecified: Secondary | ICD-10-CM | POA: Diagnosis present

## 2022-03-22 DIAGNOSIS — Z8249 Family history of ischemic heart disease and other diseases of the circulatory system: Secondary | ICD-10-CM

## 2022-03-22 DIAGNOSIS — Y908 Blood alcohol level of 240 mg/100 ml or more: Secondary | ICD-10-CM | POA: Diagnosis present

## 2022-03-22 DIAGNOSIS — T45516A Underdosing of anticoagulants, initial encounter: Secondary | ICD-10-CM | POA: Diagnosis present

## 2022-03-22 DIAGNOSIS — D6959 Other secondary thrombocytopenia: Secondary | ICD-10-CM | POA: Diagnosis present

## 2022-03-22 HISTORY — DX: Alcohol abuse, uncomplicated: F10.10

## 2022-03-22 LAB — COMPREHENSIVE METABOLIC PANEL
ALT: 104 U/L — ABNORMAL HIGH (ref 0–44)
AST: 247 U/L — ABNORMAL HIGH (ref 15–41)
Albumin: 4.1 g/dL (ref 3.5–5.0)
Alkaline Phosphatase: 97 U/L (ref 38–126)
Anion gap: 11 (ref 5–15)
BUN: 11 mg/dL (ref 6–20)
CO2: 23 mmol/L (ref 22–32)
Calcium: 8.6 mg/dL — ABNORMAL LOW (ref 8.9–10.3)
Chloride: 107 mmol/L (ref 98–111)
Creatinine, Ser: 0.65 mg/dL (ref 0.61–1.24)
GFR, Estimated: >60 mL/min (ref >60)
Glucose, Bld: 127 mg/dL — ABNORMAL HIGH (ref 70–99)
Potassium: 4.1 mmol/L (ref 3.5–5.1)
Sodium: 141 mmol/L (ref 135–145)
Total Bilirubin: 0.7 mg/dL (ref 0.3–1.2)
Total Protein: 7.7 g/dL (ref 6.5–8.1)

## 2022-03-22 LAB — CBC WITH DIFFERENTIAL/PLATELET
Abs Immature Granulocytes: 0.01 10*3/uL (ref 0.00–0.07)
Basophils Absolute: 0.1 10*3/uL (ref 0.0–0.1)
Basophils Relative: 1 %
Eosinophils Absolute: 0.1 10*3/uL (ref 0.0–0.5)
Eosinophils Relative: 1 %
HCT: 40.2 % (ref 39.0–52.0)
Hemoglobin: 13.9 g/dL (ref 13.0–17.0)
Immature Granulocytes: 0 %
Lymphocytes Relative: 36 %
Lymphs Abs: 2 10*3/uL (ref 0.7–4.0)
MCH: 32.8 pg (ref 26.0–34.0)
MCHC: 34.6 g/dL (ref 30.0–36.0)
MCV: 94.8 fL (ref 80.0–100.0)
Monocytes Absolute: 0.6 10*3/uL (ref 0.1–1.0)
Monocytes Relative: 10 %
Neutro Abs: 2.8 10*3/uL (ref 1.7–7.7)
Neutrophils Relative %: 52 %
Platelets: 83 10*3/uL — ABNORMAL LOW (ref 150–400)
RBC: 4.24 MIL/uL (ref 4.22–5.81)
RDW: 14.9 % (ref 11.5–15.5)
WBC: 5.5 10*3/uL (ref 4.0–10.5)
nRBC: 0 % (ref 0.0–0.2)

## 2022-03-22 LAB — ETHANOL: Alcohol, Ethyl (B): 402 mg/dL (ref <10)

## 2022-03-22 LAB — MAGNESIUM: Magnesium: 2 mg/dL (ref 1.7–2.4)

## 2022-03-22 MED ORDER — SODIUM CHLORIDE 0.9 % IV BOLUS
1000.0000 mL | Freq: Once | INTRAVENOUS | Status: AC
Start: 2022-03-22 — End: 2022-03-22
  Administered 2022-03-22: 1000 mL via INTRAVENOUS

## 2022-03-22 MED ORDER — THIAMINE HCL 100 MG/ML IJ SOLN
100.0000 mg | Freq: Once | INTRAMUSCULAR | Status: AC
  Administered 2022-03-22: 100 mg via INTRAVENOUS
  Filled 2022-03-22: qty 2

## 2022-03-22 MED ORDER — DILTIAZEM LOAD VIA INFUSION
10.0000 mg | Freq: Once | INTRAVENOUS | Status: AC
  Administered 2022-03-22: 10 mg via INTRAVENOUS
  Filled 2022-03-22: qty 10

## 2022-03-22 MED ORDER — DILTIAZEM HCL-DEXTROSE 125-5 MG/125ML-% IV SOLN (PREMIX)
5.0000 mg/h | INTRAVENOUS | Status: DC
  Administered 2022-03-22: 5 mg/h via INTRAVENOUS
  Administered 2022-03-23 – 2022-03-24 (×2): 10 mg/h via INTRAVENOUS
  Filled 2022-03-22 (×3): qty 125

## 2022-03-22 NOTE — ED Notes (Signed)
ED Provider at bedside. 

## 2022-03-22 NOTE — ED Triage Notes (Signed)
C/o breathing difficulties. Hx of a-fib. Pt states they have been drinking "a lot" lately "all day every day" (half a gallon of liquor)Pt needs help detoxing and help with a-fib.

## 2022-03-22 NOTE — ED Provider Notes (Signed)
[facility] EMERGENCY DEPARTMENT Provider Note   CSN: [id number] Arrival date & time: 03/22/22  2032     History  Chief Complaint  Patient presents with   A-Fib with SOB    Omar Mccarthy is a 31 y.o. male.  HPI Patient presents with breathing difficulties.  Has been on an alcoholic bender for around a month now.  Drinks about 1/2 gallon of liquor every other day.  States he drinks slugs of vodka while he is at work.  Does that about every 30 minutes at work and every 15 minutes when he is at home.  States that he gets up at night to have to have some alcohol.  History of atrial fibrillation.  Appears to be in A-fib with RVR now.  He is on anticoagulation with Eliquis.  States he is compliant.  Also wants treatment for his alcohol use.  Has had hallucinations with withdrawal in the past.  States he has not had seizures.  Has been drinking up until arrival here.    Home Medications Prior to Admission medications   Medication Sig Start Date End Date Taking? Authorizing Provider  acetaminophen (TYLENOL) 500 MG tablet Take 1,000 mg by mouth every 6 (six) hours as needed for moderate pain.    [provider]  apixaban (ELIQUIS) 5 MG TABS tablet Take 1 tablet (5 mg total) by mouth 2 (two) times daily. 11/09/21   Strader, Fransisco Hertz, PA-C  buprenorphine-naloxone (SUBOXONE) 8-2 mg SUBL SL tablet Place 1 tablet under the tongue daily.    [provider]  diltiazem (CARDIZEM CD) 120 MG 24 hr capsule Take 1 capsule (120 mg total) by mouth daily. 11/21/21 11/21/22  Arnoldo Lenis, MD  Multiple Vitamin (MULTIVITAMIN WITH MINERALS) TABS tablet Take 1 tablet by mouth daily. Patient not taking: Reported on 10/23/2021 04/14/21   Kathie Dike, MD  nicotine (NICODERM CQ - DOSED IN MG/24 HOURS) 21 mg/24hr patch Place 1 patch (21 mg total) onto the skin daily. Patient not taking: Reported on 11/09/2021 10/26/21   Roxan Hockey, MD  pantoprazole (PROTONIX) 40 MG tablet Take 1 tablet  (40 mg total) by mouth daily. Patient not taking: Reported on 11/09/2021 10/25/21   Roxan Hockey, MD  thiamine 100 MG tablet Take 1 tablet (100 mg total) by mouth daily. Patient not taking: Reported on 11/09/2021 10/26/21   Roxan Hockey, MD      Allergies    Patient has no known allergies.    Review of Systems   Review of Systems  Physical Exam Updated Vital Signs BP 127/79   Pulse (!) 122   Temp 98.3 F (36.8 C) (Oral)   Resp (!) 22   Ht 5\' 9"  (1.753 m)   Wt 111.1 kg   SpO2 98%   BMI 36.18 kg/m  Physical Exam Vitals and nursing note reviewed.  Eyes:     Pupils: Pupils are equal, round, and reactive to light.  Cardiovascular:     Rate and Rhythm: Tachycardia present. Rhythm irregular.  Pulmonary:     Breath sounds: No wheezing.  Abdominal:     Tenderness: There is no abdominal tenderness.  Musculoskeletal:        General: No tenderness.     Cervical back: Neck supple.  Neurological:     Mental Status: He is alert and oriented to person, place, and time.     ED Results / Procedures / Treatments   Labs (all labs ordered are listed, but only abnormal results are displayed)  Labs Reviewed  COMPREHENSIVE METABOLIC PANEL - Abnormal; Notable for the following components:      Result Value   Glucose, Bld 127 (*)    Calcium 8.6 (*)    AST 247 (*)    ALT 104 (*)    All other components within normal limits  ETHANOL - Abnormal; Notable for the following components:   Alcohol, Ethyl (B) 402 (*)    All other components within normal limits  CBC WITH DIFFERENTIAL/PLATELET - Abnormal; Notable for the following components:   Platelets 83 (*)    All other components within normal limits  MAGNESIUM  RAPID URINE DRUG SCREEN, HOSP PERFORMED    EKG None  Radiology DG Chest Portable 1 View  Result Date: 03/22/2022 CLINICAL DATA:  Difficulty breathing, atrial fibrillation EXAM: PORTABLE CHEST 1 VIEW COMPARISON:  10/23/2021 FINDINGS: Single frontal view of the chest  demonstrates an enlarged cardiac silhouette. No airspace disease, effusion, or pneumothorax. Mild central vascular congestion. No acute bony abnormalities. IMPRESSION: 1. Enlarged cardiac silhouette with mild central vascular congestion. No overt edema. Electronically Signed   By: Randa Ngo M.D.   On: 03/22/2022 21:52    Procedures Procedures    Medications Ordered in ED Medications  thiamine (VITAMIN B1) injection 100 mg (has no administration in time range)  diltiazem (CARDIZEM) 1 mg/mL load via infusion 10 mg (has no administration in time range)    And  diltiazem (CARDIZEM) 125 mg in dextrose 5% 125 mL (1 mg/mL) infusion (has no administration in time range)  sodium chloride 0.9 % bolus 1,000 mL (1,000 mLs Intravenous New Bag/Given 03/22/22 2143)    ED Course/ Medical Decision Making/ A&P                           Medical Decision Making Amount and/or Complexity of Data Reviewed Labs: ordered. Radiology: ordered.   Patient presents with shortness of breath.  I think is likely secondary to his atrial fibrillation.  Has a history of A-fib and is currently in A-fib with RVR.  This is probably secondary to his increased alcohol use.  Has been drinking very heavily.  Drinks to the point where he does not even sleep all night so he can get a drink in the middle the night.  He has had somewhat severe withdrawal in the past.  Heart rate is improved somewhat on fluid bolus.  Lab work reviewed and LFTs mildly elevated but not at a severe level.  Alcohol level still pending at this time.  We will start a Cardizem drip to help slow him down and will give thiamine.  Will require admission to the hospital both for the A-fib RVR and likely severe alcohol withdrawal which will will likely present itself during the stay. Thrombocytopenia is present which is also likely related to the alcohol use. Alcohol level is now come back at over 400.  CRITICAL CARE Performed by: Davonna Belling Total  critical care time: 30 minutes Critical care time was exclusive of separately billable procedures and treating other patients. Critical care was necessary to treat or prevent imminent or life-threatening deterioration. Critical care was time spent personally by me on the following activities: development of treatment plan with patient and/or surrogate as well as nursing, discussions with consultants, evaluation of patient's response to treatment, examination of patient, obtaining history from patient or surrogate, ordering and performing treatments and interventions, ordering and review of laboratory studies, ordering and review of radiographic studies, pulse  oximetry and re-evaluation of patient's condition. I for        Final Clinical Impression(s) / ED Diagnoses Final diagnoses:  Atrial fibrillation with rapid ventricular response ([place])  Alcohol use disorder    Rx / DC Orders ED Discharge Orders     None         Davonna Belling, MD 03/22/22 2223

## 2022-03-22 NOTE — ED Notes (Signed)
Date and time results received: 03/22/22 2220 (use smartphrase ".now" to insert current time)  Test: ETOH Critical Value: 402   Name of Provider Notified: Alvino Chapel  Orders Received? Or Actions Taken?:

## 2022-03-23 ENCOUNTER — Inpatient Hospital Stay (HOSPITAL_COMMUNITY): Payer: Self-pay

## 2022-03-23 ENCOUNTER — Encounter (HOSPITAL_COMMUNITY): Payer: Self-pay | Admitting: Family Medicine

## 2022-03-23 DIAGNOSIS — D696 Thrombocytopenia, unspecified: Secondary | ICD-10-CM

## 2022-03-23 DIAGNOSIS — R7401 Elevation of levels of liver transaminase levels: Secondary | ICD-10-CM

## 2022-03-23 DIAGNOSIS — Z720 Tobacco use: Secondary | ICD-10-CM

## 2022-03-23 DIAGNOSIS — I4891 Unspecified atrial fibrillation: Secondary | ICD-10-CM

## 2022-03-23 DIAGNOSIS — K219 Gastro-esophageal reflux disease without esophagitis: Secondary | ICD-10-CM

## 2022-03-23 DIAGNOSIS — F10129 Alcohol abuse with intoxication, unspecified: Secondary | ICD-10-CM

## 2022-03-23 LAB — COMPREHENSIVE METABOLIC PANEL
ALT: 92 U/L — ABNORMAL HIGH (ref 0–44)
AST: 205 U/L — ABNORMAL HIGH (ref 15–41)
Albumin: 3.6 g/dL (ref 3.5–5.0)
Alkaline Phosphatase: 89 U/L (ref 38–126)
Anion gap: 11 (ref 5–15)
BUN: 9 mg/dL (ref 6–20)
CO2: 23 mmol/L (ref 22–32)
Calcium: 8 mg/dL — ABNORMAL LOW (ref 8.9–10.3)
Chloride: 108 mmol/L (ref 98–111)
Creatinine, Ser: 0.58 mg/dL — ABNORMAL LOW (ref 0.61–1.24)
GFR, Estimated: >60 mL/min (ref >60)
Glucose, Bld: 129 mg/dL — ABNORMAL HIGH (ref 70–99)
Potassium: 4.1 mmol/L (ref 3.5–5.1)
Sodium: 142 mmol/L (ref 135–145)
Total Bilirubin: 0.7 mg/dL (ref 0.3–1.2)
Total Protein: 7 g/dL (ref 6.5–8.1)

## 2022-03-23 LAB — RAPID URINE DRUG SCREEN, HOSP PERFORMED
Amphetamines: NOT DETECTED
Barbiturates: NOT DETECTED
Benzodiazepines: NOT DETECTED
Cocaine: NOT DETECTED
Opiates: NOT DETECTED
Tetrahydrocannabinol: NOT DETECTED

## 2022-03-23 LAB — URINALYSIS, COMPLETE (UACMP) WITH MICROSCOPIC
Bacteria, UA: NONE SEEN
Bilirubin Urine: NEGATIVE
Glucose, UA: 50 mg/dL — AB
Ketones, ur: NEGATIVE mg/dL
Leukocytes,Ua: NEGATIVE
Nitrite: NEGATIVE
Protein, ur: 30 mg/dL — AB
Specific Gravity, Urine: 1.023 (ref 1.005–1.030)
pH: 6 (ref 5.0–8.0)

## 2022-03-23 LAB — CBC WITH DIFFERENTIAL/PLATELET
Abs Immature Granulocytes: 0.01 10*3/uL (ref 0.00–0.07)
Basophils Absolute: 0 10*3/uL (ref 0.0–0.1)
Basophils Relative: 1 %
Eosinophils Absolute: 0.1 10*3/uL (ref 0.0–0.5)
Eosinophils Relative: 1 %
HCT: 39.6 % (ref 39.0–52.0)
Hemoglobin: 13.4 g/dL (ref 13.0–17.0)
Immature Granulocytes: 0 %
Lymphocytes Relative: 38 %
Lymphs Abs: 1.7 10*3/uL (ref 0.7–4.0)
MCH: 33.1 pg (ref 26.0–34.0)
MCHC: 33.8 g/dL (ref 30.0–36.0)
MCV: 97.8 fL (ref 80.0–100.0)
Monocytes Absolute: 0.5 10*3/uL (ref 0.1–1.0)
Monocytes Relative: 10 %
Neutro Abs: 2.2 10*3/uL (ref 1.7–7.7)
Neutrophils Relative %: 50 %
Platelets: 65 10*3/uL — ABNORMAL LOW (ref 150–400)
RBC: 4.05 MIL/uL — ABNORMAL LOW (ref 4.22–5.81)
RDW: 15 % (ref 11.5–15.5)
WBC: 4.4 10*3/uL (ref 4.0–10.5)
nRBC: 0 % (ref 0.0–0.2)

## 2022-03-23 LAB — HEPATITIS PANEL, ACUTE
HCV Ab: NONREACTIVE
Hep A IgM: NONREACTIVE
Hep B C IgM: NONREACTIVE
Hepatitis B Surface Ag: NONREACTIVE

## 2022-03-23 LAB — MAGNESIUM: Magnesium: 1.9 mg/dL (ref 1.7–2.4)

## 2022-03-23 LAB — MRSA NEXT GEN BY PCR, NASAL: MRSA by PCR Next Gen: NOT DETECTED

## 2022-03-23 LAB — PHOSPHORUS: Phosphorus: 3 mg/dL (ref 2.5–4.6)

## 2022-03-23 LAB — LIPASE, BLOOD: Lipase: 54 U/L — ABNORMAL HIGH (ref 11–51)

## 2022-03-23 MED ORDER — FOLIC ACID 1 MG PO TABS
1.0000 mg | ORAL_TABLET | Freq: Every day | ORAL | Status: DC
  Administered 2022-03-23 – 2022-03-25 (×3): 1 mg via ORAL
  Filled 2022-03-23 (×3): qty 1

## 2022-03-23 MED ORDER — POTASSIUM CHLORIDE CRYS ER 20 MEQ PO TBCR
40.0000 meq | EXTENDED_RELEASE_TABLET | Freq: Once | ORAL | Status: AC
  Administered 2022-03-23: 40 meq via ORAL
  Filled 2022-03-23: qty 2

## 2022-03-23 MED ORDER — DILTIAZEM HCL ER COATED BEADS 120 MG PO CP24: 120.0000 mg | ORAL_CAPSULE | Freq: Every day | ORAL | Status: DC

## 2022-03-23 MED ORDER — NICOTINE 21 MG/24HR TD PT24
21.0000 mg | MEDICATED_PATCH | Freq: Every day | TRANSDERMAL | Status: DC
  Administered 2022-03-23 – 2022-03-24 (×2): 21 mg via TRANSDERMAL
  Filled 2022-03-23 (×3): qty 1

## 2022-03-23 MED ORDER — LORAZEPAM 2 MG/ML IJ SOLN
1.0000 mg | INTRAMUSCULAR | Status: DC | PRN
  Administered 2022-03-23: 4 mg via INTRAVENOUS
  Administered 2022-03-23: 2 mg via INTRAVENOUS
  Administered 2022-03-24 (×2): 4 mg via INTRAVENOUS
  Administered 2022-03-25: 2 mg via INTRAVENOUS
  Filled 2022-03-23: qty 1
  Filled 2022-03-23 (×3): qty 2

## 2022-03-23 MED ORDER — CHLORHEXIDINE GLUCONATE CLOTH 2 % EX PADS
6.0000 | MEDICATED_PAD | Freq: Every day | CUTANEOUS | Status: DC
  Administered 2022-03-23 – 2022-03-25 (×3): 6 via TOPICAL

## 2022-03-23 MED ORDER — APIXABAN 5 MG PO TABS
5.0000 mg | ORAL_TABLET | Freq: Two times a day (BID) | ORAL | Status: DC
  Administered 2022-03-23 – 2022-03-25 (×6): 5 mg via ORAL
  Filled 2022-03-23 (×6): qty 1

## 2022-03-23 MED ORDER — SODIUM CHLORIDE 0.9 % IV SOLN: INTRAVENOUS | Status: AC

## 2022-03-23 MED ORDER — MAGNESIUM SULFATE 2 GM/50ML IV SOLN
2.0000 g | Freq: Once | INTRAVENOUS | Status: AC
  Administered 2022-03-23: 2 g via INTRAVENOUS
  Filled 2022-03-23: qty 50

## 2022-03-23 MED ORDER — MORPHINE SULFATE (PF) 2 MG/ML IV SOLN
2.0000 mg | INTRAVENOUS | Status: DC | PRN
  Administered 2022-03-23: 2 mg via INTRAVENOUS
  Filled 2022-03-23: qty 1

## 2022-03-23 MED ORDER — ONDANSETRON HCL 4 MG/2ML IJ SOLN
4.0000 mg | Freq: Four times a day (QID) | INTRAMUSCULAR | Status: DC | PRN
  Administered 2022-03-23 (×2): 4 mg via INTRAVENOUS
  Filled 2022-03-23 (×2): qty 2

## 2022-03-23 MED ORDER — THIAMINE MONONITRATE 100 MG PO TABS
100.0000 mg | ORAL_TABLET | Freq: Every day | ORAL | Status: DC
  Administered 2022-03-25: 100 mg via ORAL
  Filled 2022-03-23: qty 1

## 2022-03-23 MED ORDER — SODIUM CHLORIDE 0.9 % IV SOLN: INTRAVENOUS | Status: DC

## 2022-03-23 MED ORDER — PANTOPRAZOLE SODIUM 40 MG PO TBEC
40.0000 mg | DELAYED_RELEASE_TABLET | Freq: Two times a day (BID) | ORAL | Status: DC
  Administered 2022-03-23 – 2022-03-25 (×5): 40 mg via ORAL
  Filled 2022-03-23 (×5): qty 1

## 2022-03-23 MED ORDER — LORAZEPAM 1 MG PO TABS
1.0000 mg | ORAL_TABLET | ORAL | Status: DC | PRN
  Administered 2022-03-25: 2 mg via ORAL
  Filled 2022-03-23: qty 2

## 2022-03-23 MED ORDER — CHLORDIAZEPOXIDE HCL 5 MG PO CAPS
10.0000 mg | ORAL_CAPSULE | Freq: Three times a day (TID) | ORAL | Status: DC
  Administered 2022-03-23 – 2022-03-25 (×6): 10 mg via ORAL
  Filled 2022-03-23 (×6): qty 2

## 2022-03-23 MED ORDER — ADULT MULTIVITAMIN W/MINERALS CH
1.0000 | ORAL_TABLET | Freq: Every day | ORAL | Status: DC
  Administered 2022-03-23 – 2022-03-25 (×3): 1 via ORAL
  Filled 2022-03-23 (×3): qty 1

## 2022-03-23 MED ORDER — FUROSEMIDE 10 MG/ML IJ SOLN
40.0000 mg | Freq: Once | INTRAMUSCULAR | Status: AC
  Administered 2022-03-23: 40 mg via INTRAVENOUS
  Filled 2022-03-23: qty 4

## 2022-03-23 MED ORDER — ONDANSETRON HCL 4 MG PO TABS: 4.0000 mg | ORAL_TABLET | Freq: Four times a day (QID) | ORAL | Status: DC | PRN

## 2022-03-23 MED ORDER — OXYCODONE HCL 5 MG PO TABS: 5.0000 mg | ORAL_TABLET | ORAL | Status: DC | PRN

## 2022-03-23 MED ORDER — THIAMINE HCL 100 MG/ML IJ SOLN
100.0000 mg | Freq: Every day | INTRAMUSCULAR | Status: DC
  Administered 2022-03-23 – 2022-03-24 (×2): 100 mg via INTRAVENOUS
  Filled 2022-03-23 (×2): qty 2

## 2022-03-23 MED ORDER — LORAZEPAM 2 MG/ML IJ SOLN
0.0000 mg | Freq: Four times a day (QID) | INTRAMUSCULAR | Status: AC
  Administered 2022-03-23: 4 mg via INTRAVENOUS
  Administered 2022-03-23 (×2): 2 mg via INTRAVENOUS
  Administered 2022-03-23 – 2022-03-24 (×5): 4 mg via INTRAVENOUS
  Filled 2022-03-23: qty 1
  Filled 2022-03-23 (×5): qty 2
  Filled 2022-03-23: qty 1
  Filled 2022-03-23: qty 2

## 2022-03-23 MED ORDER — PANTOPRAZOLE SODIUM 40 MG PO TBEC: 40.0000 mg | DELAYED_RELEASE_TABLET | Freq: Every day | ORAL | Status: DC

## 2022-03-23 MED ORDER — ACETAMINOPHEN 325 MG PO TABS: 650.0000 mg | ORAL_TABLET | Freq: Four times a day (QID) | ORAL | Status: DC | PRN

## 2022-03-23 MED ORDER — ACETAMINOPHEN 650 MG RE SUPP: 650.0000 mg | Freq: Four times a day (QID) | RECTAL | Status: DC | PRN

## 2022-03-23 MED ORDER — OXYCODONE HCL 5 MG PO TABS
5.0000 mg | ORAL_TABLET | Freq: Four times a day (QID) | ORAL | Status: DC | PRN
  Administered 2022-03-23 – 2022-03-25 (×6): 5 mg via ORAL
  Filled 2022-03-23 (×6): qty 1

## 2022-03-23 MED ORDER — LORAZEPAM 2 MG/ML IJ SOLN
0.0000 mg | Freq: Two times a day (BID) | INTRAMUSCULAR | Status: DC
  Administered 2022-03-25: 4 mg via INTRAVENOUS
  Filled 2022-03-23 (×2): qty 2

## 2022-03-23 NOTE — H&P (Signed)
History and Physical    Patient: Omar Mccarthy [id number] DOB: 1991-03-15 DOA: 03/22/2022 DOS: the patient was seen and examined on 03/23/2022 PCP: Pcp, No  Patient coming from: Home  Chief Complaint:  Chief Complaint  Patient presents with   A-Fib with SOB   HPI: Omar Mccarthy is a 31 y.o. male with medical history significant of anxiety, atrial fibrillation, depression, hypertension, and more presents the ED with a chief complaint of dyspnea.  Patient is mostly here because he wants to quit drinking.  He reports that he knew his A-fib was "acting up."  The symptoms she experienced her shortness of breath, palpitations, and intermittent chest pain.  The last time he had chest pain was probably a week ago according to his wife.  When he has the symptoms they last for minutes and resolved.  He reports he is not taking his medications as prescribed.  He misses doses of Eliquis and doses of diltiazem.  Patient reports that he has been drinking very heavily.  He has been drinking half a gallon of liquor per day.  He has to get up during the night to take shots, and also take shots at work.  He gets sent home from work today because he was drinking on the job.  Patient reports a few days ago he had significant abdominal pain.  Felt like a band going across the top of his abdomen and radiating through to his back.  He has never had pancreatitis to his knowledge.  He had a lot of flatulence, and a couple bowel movements, and the pain improved significantly.  At this time, patient is resting comfortably.  He is somnolent and keeps falling asleep during the exam, but that is likely due to Ativan and morphine has been given.  Patient has no other complaints at this time.  Patient smokes 1-1/2 packs/day.  As described above he drinks half a gallon of liquor per day.  He does not use illicit drugs.  He is not vaccinated for COVID.  Patient is full code. Review of Systems: As mentioned in the history  of present illness. All other systems reviewed and are negative. Past Medical History:  Diagnosis Date   Anxiety    Atrial fibrillation ([place])    a. diagnosed in 12/2019 with successful DCCV in 01/2020 b. recurrence in 09/2021 in the setting of significant alcohol use and successful DCCV in 11/2021   Depression    Dysrhythmia    Hypertension    Past Surgical History:  Procedure Laterality Date   CARDIOVERSION N/A 02/24/2020   Procedure: CARDIOVERSION;  Surgeon: Satira Sark, MD;  Location: [facility];  Service: Cardiovascular;  Laterality: N/A;   CARDIOVERSION N/A 12/12/2021   Procedure: CARDIOVERSION;  Surgeon: Arnoldo Lenis, MD;  Location: [facility] ORS;  Service: Endoscopy;  Laterality: N/A;   Social History:  reports that he has been smoking cigarettes. He has been smoking an average of 1 pack per day. He has never used smokeless tobacco. He reports that he does not currently use alcohol. He reports that he does not currently use drugs.  No Known Allergies  Family History  Problem Relation Age of Onset   High blood pressure Mother    High Cholesterol Mother    Colon cancer Mother    High blood pressure Father    High Cholesterol Father     Prior to Admission medications   Medication Sig Start Date End Date Taking? Authorizing Provider  acetaminophen (TYLENOL)  500 MG tablet Take 1,000 mg by mouth every 6 (six) hours as needed for moderate pain.    [provider]  apixaban (ELIQUIS) 5 MG TABS tablet Take 1 tablet (5 mg total) by mouth 2 (two) times daily. 11/09/21   Strader, Fransisco Hertz, PA-C  buprenorphine-naloxone (SUBOXONE) 8-2 mg SUBL SL tablet Place 1 tablet under the tongue daily.    [provider]  diltiazem (CARDIZEM CD) 120 MG 24 hr capsule Take 1 capsule (120 mg total) by mouth daily. 11/21/21 11/21/22  Arnoldo Lenis, MD  Multiple Vitamin (MULTIVITAMIN WITH MINERALS) TABS tablet Take 1 tablet by mouth daily. Patient not taking: Reported on  10/23/2021 04/14/21   Kathie Dike, MD  nicotine (NICODERM CQ - DOSED IN MG/24 HOURS) 21 mg/24hr patch Place 1 patch (21 mg total) onto the skin daily. Patient not taking: Reported on 11/09/2021 10/26/21   Roxan Hockey, MD  pantoprazole (PROTONIX) 40 MG tablet Take 1 tablet (40 mg total) by mouth daily. Patient not taking: Reported on 11/09/2021 10/25/21   Roxan Hockey, MD  thiamine 100 MG tablet Take 1 tablet (100 mg total) by mouth daily. Patient not taking: Reported on 11/09/2021 10/26/21   Roxan Hockey, MD    Physical Exam: Vitals:   03/23/22 0109 03/23/22 0200 03/23/22 0218 03/23/22 0300  BP: 138/84  131/66 135/84  Pulse: 87 93 97 (!) 108  Resp: (!) 21 18 17 19   Temp:      TempSrc:      SpO2: 97% (!) 89% 96% 100%  Weight:      Height:       1.  General: Patient lying supine in bed,  no acute distress   2. Psychiatric: Alert and oriented x 3, mood and behavior normal for situation, pleasant and cooperative with exam   3. Neurologic: Speech and language are normal, face is symmetric, moves all 4 extremities voluntarily, at baseline without acute deficits on limited exam   4. HEENMT:  Head is atraumatic, normocephalic, pupils reactive to light, neck is supple, trachea is midline, mucous membranes are moist   5. Respiratory : Lungs are clear to auscultation bilaterally without wheezing, rhonchi, rales, no cyanosis, no increase in work of breathing or accessory muscle use   6. Cardiovascular : Heart rate tachycardic, rhythm is irregularly irregular, no murmurs, rubs or gallops, trace edema, peripheral pulses palpated   7. Gastrointestinal:  Abdomen is soft, nondistended, nontender to palpation bowel sounds active, no masses or organomegaly palpated   8. Skin:  Skin is warm, dry and intact without rashes, acute lesions, or ulcers on limited exam   9.Musculoskeletal:  No acute deformities or trauma, no asymmetry in tone, peripheral pulses palpated, no tenderness to  palpation in the extremities  Data Reviewed: In the ED Temp 98.3, heart rate 110s-130s, respiratory rate 16-24, blood pressure 127/79-133/87, satting at 96-99% Apparently on admission patient did drop into the mid 80s to his oxygen sats while he was asleep after receiving Ativan and morphine so he is on 2 L nasal cannula now No leukocytosis with white blood cell count of 5.5, hemoglobin 13.9, platelets 83 Chemistry is unremarkable AST 247, ALT 104 Alcohol level 402 Chest x-ray shows enlarged cardiac silhouette with mild central vascular congestion.  No overt edema EKG shows a heart rate of 135, A-fib with RVR Diltiazem drip started UDS negative 1 L normal saline given in the ED Thiamine 100 mg given in the ED Admission requested for alcohol withdrawal  Assessment and Plan: *  Atrial fibrillation with RVR (HCC) - Reported dyspnea and palpitations at home -Patient's heart rate up to 135 in the ER - Reports intermittent compliance with his Eliquis and diltiazem - Continue Eliquis - Patient is on diltiazem drip and restart his diltiazem p.o. 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 Inpatient status is judged to be reasonable and necessary in order to provide the required intensity of service to ensure the patient's safety. The patient's presenting symptoms, physical exam findings, and initial radiographic and laboratory data in the context of their chronic comorbidities is felt to place them at high risk for further clinical deterioration. Furthermore, it is not anticipated that the patient will be medically stable for discharge from the hospital within 2 midnights of admission.   * I certify that at the point of admission it is my clinical judgment that the patient will require inpatient hospital care spanning beyond 2 midnights from the point of admission due to high intensity of service, high risk for further deterioration and high frequency of surveillance required.*  Author: Rolla Plate, DO 03/23/2022 3:37 AM  For on call review [email or web address].

## 2022-03-23 NOTE — Assessment & Plan Note (Addendum)
Continue Protonix °

## 2022-03-23 NOTE — Progress Notes (Signed)
Patient seen and examined; admitted after midnight secondary to shortness of breath and palpitations.  Patient with a prior history of atrial fibrillation status post cardioversion in 2021 and recurrence in May 2023 in the setting of significant alcohol use; patient again successfully cardioverted in July 2023.  Presented now in A-fib with RVR and reporting once again falling out a wagon with excessive alcohol consumption and medication non-compliance.  Home medications have been restarted, patient receiving IV Cardizem and oral Eliquis.  Denying chest pain currently.  Please refer to H&P written by Dr. Clearence Ped on 03/23/22 for further info/details on admission.  Plan: -Continue rate control strategy -Continue anticoagulation -Close monitoring of patient's hemoglobin and platelets count. -Follow electrolytes and replete as needed (goal is for potassium above 4 and magnesium above 2) -Continue CIWA protocol, start librium and continue thiamine and folic acid. -follow clinical response. -TOC to provide outpatient resources to assist with quitting process.   Barton Dubois MD [phone]

## 2022-03-23 NOTE — Assessment & Plan Note (Signed)
-   EtOH level 402 - Patient reports drinking half of a gallon of liquor every day - Even has to take shots throughout the night and while he is at work - CIWA protocol - Monitor on stepdown as anticipate patient will need Precedex - Patient is trying to quit -He has a history of DTs -He  has never had an alcohol withdrawal seizure - Continue to monitor

## 2022-03-23 NOTE — Assessment & Plan Note (Signed)
-   Platelets down to 83 - Secondary to alcohol - No signs or symptoms of bleeding - Continue to monitor

## 2022-03-23 NOTE — Assessment & Plan Note (Signed)
-   Smokes 1-1.5 packs per day - Nicotine patch ordered

## 2022-03-23 NOTE — Assessment & Plan Note (Signed)
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 - Continue to monitor

## 2022-03-23 NOTE — Assessment & Plan Note (Signed)
-   Reports no palpitations currently and denies shortness of breath. -Heart rate for the most part stabilized. -Continue Eliquis for secondary prevention -Continue treatment with oral Cardizem and Lopressor -DC Cardizem drip -Follow telemetry monitoring -Continue repletion of electrolytes to keep potassium above 4 and magnesium above 2. -Will discuss with cardiology in AM.

## 2022-03-24 DIAGNOSIS — F1090 Alcohol use, unspecified, uncomplicated: Secondary | ICD-10-CM

## 2022-03-24 LAB — MAGNESIUM: Magnesium: 2.2 mg/dL (ref 1.7–2.4)

## 2022-03-24 LAB — CBC
HCT: 41.7 % (ref 39.0–52.0)
Hemoglobin: 14.5 g/dL (ref 13.0–17.0)
MCH: 32.7 pg (ref 26.0–34.0)
MCHC: 34.8 g/dL (ref 30.0–36.0)
MCV: 93.9 fL (ref 80.0–100.0)
Platelets: 60 10*3/uL — ABNORMAL LOW (ref 150–400)
RBC: 4.44 MIL/uL (ref 4.22–5.81)
RDW: 14 % (ref 11.5–15.5)
WBC: 5.5 10*3/uL (ref 4.0–10.5)
nRBC: 0 % (ref 0.0–0.2)

## 2022-03-24 MED ORDER — POTASSIUM CHLORIDE CRYS ER 20 MEQ PO TBCR
40.0000 meq | EXTENDED_RELEASE_TABLET | ORAL | Status: AC
  Administered 2022-03-24 (×2): 40 meq via ORAL
  Filled 2022-03-24 (×2): qty 2

## 2022-03-24 MED ORDER — SODIUM CHLORIDE 0.9 % IV SOLN: INTRAVENOUS | Status: AC

## 2022-03-24 MED ORDER — DILTIAZEM HCL ER COATED BEADS 180 MG PO CP24
180.0000 mg | ORAL_CAPSULE | Freq: Every day | ORAL | Status: DC
  Administered 2022-03-24: 180 mg via ORAL
  Filled 2022-03-24: qty 1

## 2022-03-24 MED ORDER — METOPROLOL TARTRATE 25 MG PO TABS
25.0000 mg | ORAL_TABLET | Freq: Two times a day (BID) | ORAL | Status: DC
  Administered 2022-03-24 (×2): 25 mg via ORAL
  Filled 2022-03-24 (×2): qty 1

## 2022-03-24 NOTE — Progress Notes (Signed)
Progress Note   Patient: Omar Mccarthy QPR:[id number] DOB: December 02, 1990 DOA: 03/22/2022     2 DOS: the patient was seen and examined on 03/24/2022   Brief hospital admission course: As per H&P written by Dr. Clearence Ped on 03/23/22 Omar Mccarthy is a 31 y.o. male with medical history significant of anxiety, atrial fibrillation, depression, hypertension, and more presents the ED with a chief complaint of dyspnea.  Patient is mostly here because he wants to quit drinking.  He reports that he knew his A-fib was "acting up."  The symptoms she experienced her shortness of breath, palpitations, and intermittent chest pain.  The last time he had chest pain was probably a week ago according to his wife.  When he has the symptoms they last for minutes and resolved.  He reports he is not taking his medications as prescribed.  He misses doses of Eliquis and doses of diltiazem.  Patient reports that he has been drinking very heavily.  He has been drinking half a gallon of liquor per day.  He has to get up during the night to take shots, and also take shots at work.  He gets sent home from work today because he was drinking on the job.  Patient reports a few days ago he had significant abdominal pain.  Felt like a band going across the top of his abdomen and radiating through to his back.  He has never had pancreatitis to his knowledge.  He had a lot of flatulence, and a couple bowel movements, and the pain improved significantly.  At this time, patient is resting comfortably.  He is somnolent and keeps falling asleep during the exam, but that is likely due to Ativan and morphine has been given.  Patient has no other complaints at this time.   Patient smokes 1-1/2 packs/day.  As described above he drinks half a gallon of liquor per day.  He does not use illicit drugs.  He is not vaccinated for COVID.  Patient is full code.  Assessment and Plan: * Atrial fibrillation with RVR (HCC) - Reports no palpitations  currently and denies shortness of breath. -Heart rate for the most part stabilized. -Continue Eliquis for secondary prevention -Continue treatment with oral Cardizem and Lopressor -DC Cardizem drip -Follow telemetry monitoring -Continue repletion of electrolytes to keep potassium above 4 and magnesium above 2. -Will discuss with cardiology in AM.  Tobacco abuse - Cessation counseling provided -Continue nicotine patch.  Gastroesophageal reflux disease - Continue Protonix.  Thrombocytopenia (HCC) - Platelets down to 60-80K - secondary to alcohol -No signs of overt bleeding appreciated -Okay to continue the use of Eliquis for now -Avoid the use of NSAID -Follow platelets and hemoglobin trend.    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    Subjective:  Still experiencing intermittent episode of clouding mentation, anxiety, tremors and elevated heart rate (last one not sustained).  Patient denies chest pain, no nausea, no vomiting, no shortness of breath.  Physical Exam: Vitals:   03/24/22 1400 03/24/22 1500 03/24/22 1600 03/24/22 1700  BP:  Pulse: (!) 46 89 71 95  Resp: 18 20 18 19   Temp:      TempSrc:      SpO2: 98% 97% 100% 100%  Weight:      Height:       General exam: Alert, awake, oriented x 3; reports feeling better.  No chest pain, no palpitations.  Patient is afebrile.  Positive tremors appreciated on examination; CIWA score 18-23. Respiratory system: Clear to auscultation.  Respiratory effort normal.  Good saturation on room air. Cardiovascular system: Irregular, no rubs, no gallops, no JVD. Gastrointestinal system: Abdomen is nondistended, soft and nontender. No organomegaly or masses felt. Normal bowel sounds heard. Central nervous system: Alert and oriented. No focal neurological deficits. Extremities: No cyanosis or clubbing. Skin: No petechiae. Psychiatry: Judgement and insight appear normal.  Flat affect appreciated on exam.  Data Reviewed: Magnesium: 2.2 CBC: WBCs 5.5, hemoglobin 14.5, platelet count 60 K Basic metabolic panel: Sodium Q000111Q, potassium 3.5, chloride 101, bicarb 24, BUN 6, creatinine 0.48 and anion gap more than 6.  Family Communication: Wife at bedside.  Disposition: Status is: Inpatient Remains inpatient appropriate because: Continue treatment for A-fib with RVR along with alcohol intoxication with withdrawal complications.   Planned Discharge Destination: Home   Time spent: 55 minutes minutes  Author: Barton Dubois, MD 03/24/2022 6:11 PM  For on call review [email or web address].

## 2022-03-25 ENCOUNTER — Encounter (HOSPITAL_COMMUNITY): Payer: Self-pay | Admitting: Family Medicine

## 2022-03-25 DIAGNOSIS — F1090 Alcohol use, unspecified, uncomplicated: Secondary | ICD-10-CM

## 2022-03-25 LAB — BASIC METABOLIC PANEL
Anion gap: 8 (ref 5–15)
Anion gap: 9 (ref 5–15)
BUN: 6 mg/dL (ref 6–20)
BUN: 8 mg/dL (ref 6–20)
CO2: 24 mmol/L (ref 22–32)
CO2: 20 mmol/L — ABNORMAL LOW (ref 22–32)
Calcium: 8.2 mg/dL — ABNORMAL LOW (ref 8.9–10.3)
Calcium: 8.9 mg/dL (ref 8.9–10.3)
Chloride: 101 mmol/L (ref 98–111)
Chloride: 105 mmol/L (ref 98–111)
Creatinine, Ser: 0.48 mg/dL — ABNORMAL LOW (ref 0.61–1.24)
Creatinine, Ser: 0.67 mg/dL (ref 0.61–1.24)
GFR, Estimated: >60 mL/min (ref >60)
GFR, Estimated: >60 mL/min (ref >60)
Glucose, Bld: 115 mg/dL — ABNORMAL HIGH (ref 70–99)
Glucose, Bld: 116 mg/dL — ABNORMAL HIGH (ref 70–99)
Potassium: 3.5 mmol/L (ref 3.5–5.1)
Potassium: 4.5 mmol/L (ref 3.5–5.1)
Sodium: 133 mmol/L — ABNORMAL LOW (ref 135–145)
Sodium: 134 mmol/L — ABNORMAL LOW (ref 135–145)

## 2022-03-25 MED ORDER — METOPROLOL TARTRATE 50 MG PO TABS
50.0000 mg | ORAL_TABLET | Freq: Two times a day (BID) | ORAL | Status: DC
  Administered 2022-03-25: 50 mg via ORAL
  Filled 2022-03-25: qty 1

## 2022-03-25 MED ORDER — DILTIAZEM HCL ER COATED BEADS 240 MG PO CP24: 240.0000 mg | ORAL_CAPSULE | Freq: Every day | ORAL | 3 refills | Status: DC

## 2022-03-25 MED ORDER — APIXABAN 5 MG PO TABS: 5.0000 mg | ORAL_TABLET | Freq: Two times a day (BID) | ORAL | 2 refills | Status: DC

## 2022-03-25 MED ORDER — DILTIAZEM HCL ER COATED BEADS 240 MG PO CP24
240.0000 mg | ORAL_CAPSULE | Freq: Every day | ORAL | Status: DC
  Administered 2022-03-25: 240 mg via ORAL
  Filled 2022-03-25: qty 1

## 2022-03-25 MED ORDER — NICOTINE 21 MG/24HR TD PT24: 21.0000 mg | MEDICATED_PATCH | Freq: Every day | TRANSDERMAL | 0 refills | Status: DC

## 2022-03-25 MED ORDER — THIAMINE HCL 100 MG/ML IJ SOLN: 100.0000 mg | Freq: Every day | INTRAMUSCULAR | 2 refills | Status: DC

## 2022-03-25 MED ORDER — METOPROLOL TARTRATE 50 MG PO TABS: 50.0000 mg | ORAL_TABLET | Freq: Two times a day (BID) | ORAL | 2 refills | Status: DC

## 2022-03-25 MED ORDER — PANTOPRAZOLE SODIUM 40 MG PO TBEC: 40.0000 mg | DELAYED_RELEASE_TABLET | Freq: Two times a day (BID) | ORAL | 2 refills | Status: DC

## 2022-03-25 MED ORDER — CHLORDIAZEPOXIDE HCL 5 MG PO CAPS: ORAL_CAPSULE | ORAL | 0 refills | Status: DC

## 2022-03-25 MED ORDER — FOLIC ACID 1 MG PO TABS: 1.0000 mg | ORAL_TABLET | Freq: Every day | ORAL | 2 refills | Status: DC

## 2022-03-25 MED ORDER — DILTIAZEM HCL 25 MG/5ML IV SOLN
15.0000 mg | Freq: Once | INTRAVENOUS | Status: AC
  Administered 2022-03-25: 15 mg via INTRAVENOUS
  Filled 2022-03-25: qty 5

## 2022-03-25 NOTE — Progress Notes (Signed)
Nsg Discharge Note  Admit Date:  03/22/2022 Discharge date: 03/25/2022   Omar Mccarthy to be D/C'd Home per MD order.  AVS completed.  Copy for chart, and copy for patient signed, and dated. Patient/caregiver able to verbalize understanding.  Discharge Medication: Allergies as of 03/25/2022   No Known Allergies      Medication List     TAKE these medications    acetaminophen 500 MG tablet Commonly known as: TYLENOL Take 1,000 mg by mouth in the morning and at bedtime.   apixaban 5 MG Tabs tablet Commonly known as: ELIQUIS Take 1 tablet (5 mg total) by mouth 2 (two) times daily.   chlordiazePOXIDE 5 MG capsule Commonly known as: LIBRIUM Take 2 tablets by mouth 3 times a day x2 days; then 2 tablets by mouth twice a day x2 days; then 1 tablet by mouth 3 times a day x2 days; then 1 tablet by mouth twice a day x2 days; then 1 tablet by mouth daily x3 days and stop librium.   diltiazem 240 MG 24 hr capsule Commonly known as: CARDIZEM CD Take 1 capsule (240 mg total) by mouth daily. Start taking on: March 26, 2022 What changed:  medication strength how much to take   folic acid 1 MG tablet Commonly known as: FOLVITE Take 1 tablet (1 mg total) by mouth daily. Start taking on: March 26, 2022   metoprolol tartrate 50 MG tablet Commonly known as: LOPRESSOR Take 1 tablet (50 mg total) by mouth 2 (two) times daily.   nicotine 21 mg/24hr patch Commonly known as: NICODERM CQ - dosed in mg/24 hours Place 1 patch (21 mg total) onto the skin daily. Start taking on: March 26, 2022   pantoprazole 40 MG tablet Commonly known as: PROTONIX Take 1 tablet (40 mg total) by mouth 2 (two) times daily. What changed: when to take this   thiamine 100 MG/ML injection Commonly known as: VITAMIN B1 Inject 1 mL (100 mg total) into the vein daily. Start taking on: March 26, 2022        Discharge Assessment: Vitals:   03/25/22 1100 03/25/22 1104  BP:  (!) 133/93  Pulse:   63  Resp: 19 13  Temp:    SpO2:  95%   Skin clean, dry and intact without evidence of skin break down, no evidence of skin tears noted. IV catheter discontinued intact. Site without signs and symptoms of complications - no redness or edema noted at insertion site, patient denies c/o pain - only slight tenderness at site.  Dressing with slight pressure applied.  D/c Instructions-Education: Discharge instructions given to patient/family with verbalized understanding. D/c education completed with patient/family including follow up instructions, medication list, d/c activities limitations if indicated, with other d/c instructions as indicated by MD - patient able to verbalize understanding, all questions fully answered. Patient instructed to return to ED, call 911, or call MD for any changes in condition.  Patient escorted via [place], and D/C home via private auto.  Carney Corners, RN 03/25/2022 1:43 PM

## 2022-03-25 NOTE — TOC Transition Note (Signed)
Transition of Care [facility]) - CM/SW Discharge Note   Patient Details  Name: Omar Mccarthy MRN: [id number] Date of Birth: 05-Jun-1990  Transition of Care [facility]) CM/SW Contact:  Iona Beard, [employer] Phone Number: 03/25/2022, 1:08 PM   Clinical Narrative:    [facility] consulted for substance use resources. CSW spoke with pt and spouse in room about interest in SA resources. Pts spouse states they are interested. CSW provided pt and spouse with substance use resources. TOC signing off.     Barriers to Discharge: No Barriers Identified   Patient Goals and CMS Choice Patient states their goals for this hospitalization and ongoing recovery are:: return home CMS Medicare.gov Compare Post Acute Care list provided to:: Patient Choice offered to / list presented to : Patient  Discharge Placement                       Discharge Plan and Services In-house Referral: Clinical Social Work Discharge Planning Services: CM Consult                                 Social Determinants of Health (SDOH) Interventions     Readmission Risk Interventions    04/12/2021   11:06 AM  Readmission Risk Prevention Plan  Transportation Screening Complete  Home Care Screening Complete  Medication Review (RN CM) Complete

## 2022-03-25 NOTE — Progress Notes (Signed)
Pt adamant about leaving AMA. States he has "a lot of stuff to do today." Despite his HR issue not being resolved. PRN dose of Ativan given to try and help calm patient down. MD made aware and asked to come to bedside to speak with patient. Will continue to monitor.

## 2022-03-25 NOTE — Consult Note (Signed)
Cardiology Consultation:   Patient ID: Omar Mccarthy; [id number]; 04/28/1991   Admit date: 03/22/2022 Date of Consult: 03/25/2022  Primary Care Provider: Merryl Hacker, No Primary Cardiologist: Carlyle Dolly, MD Primary Electrophysiologist: None   Patient Profile:   Omar Mccarthy is a 31 y.o. male with a history of alcohol abuse, hypertension, and paroxysmal to persistent atrial fibrillation who is being seen today for the evaluation of recurrent atrial fibrillation at the request of Dr. Dyann Kief.  History of Present Illness:   Omar Mccarthy was last seen in the cardiology office back in August at which point he was in sinus rhythm having undergone successful cardioversion in July by Dr. Harl Bowie.  At that point he was continued on Cardizem CD 120 mg daily and also Eliquis 5 mg twice daily.  Denied active alcohol use at visit in August.  He is now readmitted to the hospital with shortness of breath and recurrent atrial fibrillation with RVR in the setting of recurrent alcohol abuse and EtOH level greater than 400.  Admitted to very heavy drinking, at least a half a gallon of liquor a day.  Also stated that he had not been taking his regular cardiac medications including Eliquis and Cardizem CD.  Initially placed on intravenous Cardizem with switch to Cardizem CD and Lopressor today by primary team.  Also resumed on Eliquis.  He remains in atrial fibrillation, heart rate around 100 at rest but easily picks up in the 160s with sitting up and moving around.  States that he wants to stop drinking, but also wants to go home as he has several issues to take care of.  Past Medical History:  Diagnosis Date   Alcohol abuse    Anxiety    Atrial fibrillation ([place])    a. diagnosed in 12/2019 with successful DCCV in 01/2020 b. recurrence in 09/2021 in the setting of significant alcohol use and successful DCCV in 11/2021   Depression    Hypertension     Past Surgical History:  Procedure Laterality  Date   CARDIOVERSION N/A 02/24/2020   Procedure: CARDIOVERSION;  Surgeon: Satira Sark, MD;  Location: [facility];  Service: Cardiovascular;  Laterality: N/A;   CARDIOVERSION N/A 12/12/2021   Procedure: CARDIOVERSION;  Surgeon: Arnoldo Lenis, MD;  Location: [facility];  Service: Endoscopy;  Laterality: N/A;     Inpatient Medications: Scheduled Meds:  apixaban  5 mg Oral BID   chlordiazePOXIDE  10 mg Oral TID   Chlorhexidine Gluconate Cloth  6 each Topical Daily   diltiazem  240 mg Oral Daily   folic acid  1 mg Oral Daily   LORazepam  0-4 mg Intravenous Q12H   metoprolol tartrate  50 mg Oral BID   multivitamin with minerals  1 tablet Oral Daily   nicotine  21 mg Transdermal Daily   pantoprazole  40 mg Oral BID   thiamine  100 mg Oral Daily   Or   thiamine  100 mg Intravenous Daily    PRN Meds: acetaminophen **OR** acetaminophen, LORazepam **OR** LORazepam, ondansetron **OR** ondansetron (ZOFRAN) IV, oxyCODONE  Allergies:   No Known Allergies  Social History:   Social History   Socioeconomic History   Marital status: Married    Spouse name: Not on file   Number of children: Not on file   Years of education: Not on file   Highest education level: Not on file  Occupational History   Not on file  Tobacco Use   Smoking status: Every Day  Packs/day: 1.00    Types: Cigarettes   Smokeless tobacco: Never  Vaping Use   Vaping Use: Never used  Substance and Sexual Activity   Alcohol use: Not Currently    Comment: pt states "I drink 1 gallon of vodka every 2 days"   Drug use: Not Currently    Comment: Prior marijuana.  intermittent use of vicodin (not prescribed). Meth 01/2021   Sexual activity: Yes  Other Topics Concern   Not on file  Social History Narrative   Not on file   Social Determinants of Health   Financial Resource Strain: Not on file  Food Insecurity: No Food Insecurity (03/23/2022)   Hunger Vital Sign    Worried About Running Out of Food in the  Last Year: Never true    Ran Out of Food in the Last Year: Never true  Transportation Needs: No Transportation Needs (03/23/2022)   PRAPARE - Hydrologist (Medical): No    Lack of Transportation (Non-Medical): No  Physical Activity: Not on file  Stress: Not on file  Social Connections: Not on file  Intimate Partner Violence: Not At Risk (03/23/2022)   Humiliation, Afraid, Rape, and Kick questionnaire    Fear of Current or Ex-Partner: No    Emotionally Abused: No    Physically Abused: No    Sexually Abused: No    Family History:   The patient's family history includes Colon cancer in his mother; High Cholesterol in his father and mother; High blood pressure in his father and mother.  ROS:  Please see the history of present illness.  All other ROS reviewed and negative.     Physical Exam/Data:   Vitals:   03/25/22 0504 03/25/22 0610 03/25/22 0717 03/25/22 0856  BP: (!) 161/102 (!) 159/121  (!) 167/133  Pulse:      Resp: 18 (!) 21 16 11   Temp:   98.5 F (36.9 C)   TempSrc:   Oral   SpO2:      Weight:      Height:        Intake/Output Summary (Last 24 hours) at 03/25/2022 0910 Last data filed at 03/25/2022 0855 Gross per 24 hour  Intake 1211.36 ml  Output 3100 ml  Net -1888.64 ml   Filed Weights   03/22/22 2111 03/23/22 0100 03/25/22 0403  Weight: 111.1 kg 112.7 kg 109 kg   Body mass index is 35.49 kg/m.   Gen: Patient appears comfortable at rest. HEENT: Conjunctiva and lids normal. Neck: Supple, no elevated JVP or carotid bruits, no thyromegaly. Lungs: Clear to auscultation, nonlabored breathing at rest. Cardiac: Irregularly irregular, no S3 or significant systolic murmur, no pericardial rub. Abdomen: Soft, nontender, bowel sounds present. Extremities: No pitting edema, distal pulses 2+. Skin: Warm and dry. Musculoskeletal: No kyphosis. Neuropsychiatric: Alert and oriented x3, affect grossly appropriate.  EKG:  An ECG dated  03/22/2022 was personally reviewed today and demonstrated:  Atrial fibrillation with RVR, nonspecific ST changes.  Telemetry:  I personally reviewed telemetry which shows atrial fibrillation.  Relevant CV Studies:  Echocardiogram 10/24/2021:  1. Right ventricular systolic function is normal. The right ventricular  size is normal.   2. The aortic valve has an indeterminant number of cusps.   3.      LVEF 65-70%. Difficult evaluation of LVOT gradient based on available  Dopplers and in the setting of afib with RVR. Consider repeat study when  rates better controlled.   Laboratory Data:  Chemistry Recent Labs  Lab 03/23/22 0350 03/24/22 0334 03/25/22 0354  NA 142 133* 134*  K 4.1 3.5 4.5  CL 108 101 105  CO2 23 24 20*  GLUCOSE 129* 115* 116*  BUN 9 6 8   CREATININE 0.58* 0.48* 0.67  CALCIUM 8.0* 8.2* 8.9  GFRNONAA >60 >60 >60  ANIONGAP 11 8 9     Recent Labs  Lab 03/22/22 2135 03/23/22 0350  PROT 7.7 7.0  ALBUMIN 4.1 3.6  AST 247* 205*  ALT 104* 92*  ALKPHOS 97 89  BILITOT 0.7 0.7   Hematology Recent Labs  Lab 03/22/22 2135 03/23/22 0350 03/24/22 0334  WBC 5.5 4.4 5.5  RBC 4.24 4.05* 4.44  HGB 13.9 13.4 14.5  HCT 40.2 39.6 41.7  MCV 94.8 97.8 93.9  MCH 32.8 33.1 32.7  MCHC 34.6 33.8 34.8  RDW 14.9 15.0 14.0  PLT 83* 65* 60*    Radiology/Studies:  US Abdomen Limited RUQ (LIVER/GB)  Result Date: 03/23/2022 CLINICAL DATA:  Transaminitis EXAM: ULTRASOUND ABDOMEN LIMITED RIGHT UPPER QUADRANT COMPARISON:  Abdominal ultrasound dated 04/10/2021 FINDINGS: Gallbladder: No gallstones or wall thickening visualized. No sonographic Murphy sign noted by sonographer. Common bile duct: Diameter: 3 mm Liver: No focal lesion identified. Liver echogenicity is increased and liver echotexture is heterogeneous. Portal vein is patent on color Doppler imaging with normal direction of blood flow towards the liver. Other: None. IMPRESSION: 1. Increased echogenicity and heterogeneous  echotexture of the liver may represent hepatic steatosis and/or other hepatocellular disease. 2. No evidence of cholelithiasis or acute cholecystitis. Electronically Signed   By: Zerita Boers M.D.   On: 03/23/2022 09:46   DG Chest Portable 1 View  Result Date: 03/22/2022 CLINICAL DATA:  Difficulty breathing, atrial fibrillation EXAM: PORTABLE CHEST 1 VIEW COMPARISON:  10/23/2021 FINDINGS: Single frontal view of the chest demonstrates an enlarged cardiac silhouette. No airspace disease, effusion, or pneumothorax. Mild central vascular congestion. No acute bony abnormalities. IMPRESSION: 1. Enlarged cardiac silhouette with mild central vascular congestion. No overt edema. Electronically Signed   By: Randa Ngo M.D.   On: 03/22/2022 21:52    Assessment and Plan:   1.  Paroxysmal to persistent atrial fibrillation presenting with RVR in the setting of recurrent alcohol abuse and intoxication.  EtOH level greater than 400.  He has been started back on Eliquis which she had not been taking consistently, to receive dose of Cardizem CD 240 mg daily and Lopressor 50 mg twice daily this morning.  CHA2DS2-VASc score is 1.  2.  Alcohol abuse with heavy intake and history of binge drinking.  This significantly complicates adequate management of problem #1.  Formal alcohol abuse rehabilitation would be recommended.  He does state that he wants to quit drinking.  3.  Essential hypertension.  Difficult management situation.  Pursuing a cardioversion now would only be a temporary measure and in fact he would need to have a TEE cardioversion since he has been off Eliquis even if this were pursued.  Treating him with a long-term strategy of repeat cardioversions when he has excess alcohol intake is not a reasonable option however.  Give Cardizem CD 240 mg and Lopressor 50 mg twice daily with doses this morning, continue Eliquis.  Ideally would try to control heart rate and manage this as an outpatient as he  hopefully undergoes further treatment/rehabilitation for alcohol abuse.  This will really be his only chance at getting his cardiac arrhythmia under better control.  Signed, Rozann Lesches, MD  03/25/2022 9:10 AM

## 2022-03-25 NOTE — Discharge Summary (Signed)
Physician Discharge Summary   Patient: Omar Mccarthy MRN: [id number] DOB: 1990-10-13  Admit date:     03/22/2022  Discharge date: 03/25/22  Discharge Physician: Barton Dubois   PCP: Pcp, No   Recommendations at discharge:  Repeat basic metabolic panel and magnesium level to follow electrolytes and renal function. Continue assisting patient with alcohol cessation Repeat CBC to follow hemoglobin and platelet count stability Continue assisting patient with tobacco cessation.  Brief hospital admission course: As per H&P written by Dr. Clearence Ped on 03/23/22 Omar Mccarthy is a 31 y.o. male with medical history significant of anxiety, atrial fibrillation, depression, hypertension, and more presents the ED with a chief complaint of dyspnea.  Patient is mostly here because he wants to quit drinking.  He reports that he knew his A-fib was "acting up."  The symptoms she experienced her shortness of breath, palpitations, and intermittent chest pain.  The last time he had chest pain was probably a week ago according to his wife.  When he has the symptoms they last for minutes and resolved.  He reports he is not taking his medications as prescribed.  He misses doses of Eliquis and doses of diltiazem.  Patient reports that he has been drinking very heavily.  He has been drinking half a gallon of liquor per day.  He has to get up during the night to take shots, and also take shots at work.  He gets sent home from work today because he was drinking on the job.  Patient reports a few days ago he had significant abdominal pain.  Felt like a band going across the top of his abdomen and radiating through to his back.  He has never had pancreatitis to his knowledge.  He had a lot of flatulence, and a couple bowel movements, and the pain improved significantly.  At this time, patient is resting comfortably.  He is somnolent and keeps falling asleep during the exam, but that is likely due to Ativan and morphine  has been given.  Patient has no other complaints at this time.   Patient smokes 1-1/2 packs/day.  As described above he drinks half a gallon of liquor per day.  He does not use illicit drugs.  He is not vaccinated for COVID.  Patient is full code.  Discharge Diagnoses: Principal Problem:   Atrial fibrillation with rapid ventricular response (HCC) Active Problems:   Alcohol abuse with intoxication (HCC)   Elevated transaminase level   Thrombocytopenia (HCC)   Gastroesophageal reflux disease   Tobacco abuse   Alcohol use disorder  Hospital Course: As per H&P written by Dr. Clearence Ped on 03/23/22 Omar Mccarthy is a 31 y.o. male with medical history significant of anxiety, atrial fibrillation, depression, hypertension, and more presents the ED with a chief complaint of dyspnea.  Patient is mostly here because he wants to quit drinking.  He reports that he knew his A-fib was "acting up."  The symptoms she experienced her shortness of breath, palpitations, and intermittent chest pain.  The last time he had chest pain was probably a week ago according to his wife.  When he has the symptoms they last for minutes and resolved.  He reports he is not taking his medications as prescribed.  He misses doses of Eliquis and doses of diltiazem.  Patient reports that he has been drinking very heavily.  He has been drinking half a gallon of liquor per day.  He has to get up during the night to take  shots, and also take shots at work.  He gets sent home from work today because he was drinking on the job.  Patient reports a few days ago he had significant abdominal pain.  Felt like a band going across the top of his abdomen and radiating through to his back.  He has never had pancreatitis to his knowledge.  He had a lot of flatulence, and a couple bowel movements, and the pain improved significantly.  At this time, patient is resting comfortably.  He is somnolent and keeps falling asleep during the exam, but that  is likely due to Ativan and morphine has been given.  Patient has no other complaints at this time.   Patient smokes 1-1/2 packs/day.  As described above he drinks half a gallon of liquor per day.  He does not use illicit drugs.  He is not vaccinated for COVID.  Patient is full code.  Assessment and Plan: * Atrial fibrillation with rapid ventricular response (HCC) -Reports no palpitations currently and denies shortness of breath. -Heart rate for the most part stabilized and controlled. -Continue Eliquis for secondary prevention -Continue treatment with oral Cardizem and Lopressor, dose adjusted as per cardiology recommendations. -alcohol cessation discussed with patient. -will continue eliquis for secondary prevention. -Continue repletion of electrolytes to keep potassium above 4 and magnesium above 2. -Outpatient follow-up with cardiology in 2-3 weeks.  Tobacco abuse -Cessation counseling provided -Continue nicotine patch.  Gastroesophageal reflux disease -Continue Protonix. -Alcohol cessation recommended.  Thrombocytopenia (HCC) - Platelets down to 60-80K - secondary to alcohol most likely. -No signs of overt bleeding appreciated -Okay to continue the use of Eliquis for now -Continue to avoid the use of NSAID -Follow platelets and hemoglobin trend.    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 -Alcohol cessation provided -Continue to follow LFTs intermittently.  Alcohol abuse with intoxication ([place]) - EtOH level 402 at time of admission -No significant withdrawal symptoms at time of discharge -Continue Librium tapering -Continue thiamine and folic acid -Alcohol cessation provided -Outpatient resources given by TOC.  Class II obesity -Body mass index is 35.49 kg/m. -Low-calorie diet and portion control discussed with patient.  Consultants: Cardiology  service Procedures performed: See below for x-ray reports Disposition: Home Diet recommendation: heart healthy diet recommended to patient.  DISCHARGE MEDICATION: Allergies as of 03/25/2022   No Known Allergies      Medication List     TAKE these medications    acetaminophen 500 MG tablet Commonly known as: TYLENOL Take 1,000 mg by mouth in the morning and at bedtime.   apixaban 5 MG Tabs tablet Commonly known as: ELIQUIS Take 1 tablet (5 mg total) by mouth 2 (two) times daily.   chlordiazePOXIDE 5 MG capsule Commonly known as: LIBRIUM Take 2 tablets by mouth 3 times a day x2 days; then 2 tablets by mouth twice a day x2 days; then 1 tablet by mouth 3 times a day x2 days; then 1 tablet by mouth twice a day x2 days; then 1 tablet by mouth daily x3 days and stop librium.   diltiazem 240 MG 24 hr capsule Commonly known as: CARDIZEM CD Take 1 capsule (240 mg total) by mouth daily. Start taking on: March 26, 2022 What changed:  medication strength how much to take   folic acid 1 MG tablet Commonly known as: FOLVITE Take 1 tablet (1 mg total) by mouth daily. Start taking on: March 26, 2022   metoprolol tartrate 50 MG tablet Commonly known as: LOPRESSOR Take 1 tablet (50 mg total) by mouth 2 (two) times daily.   nicotine 21 mg/24hr patch Commonly known as: NICODERM CQ - dosed in mg/24 hours Place 1 patch (21 mg total) onto the skin daily. Start taking on: March 26, 2022   pantoprazole 40 MG tablet Commonly known as: PROTONIX Take 1 tablet (40 mg total) by mouth 2 (two) times daily. What changed: when to take this   thiamine 100 MG/ML injection Commonly known as: VITAMIN B1 Inject 1 mL (100 mg total) into the vein daily. Start taking on: March 26, 2022        Follow-up Information     [facility], [place]. Schedule an appointment as soon as possible for a visit today.   Specialties: Physician Assistant, Cardiology Why: contact for appointment in  2-3 weeks Contact information: [place] [address] [phone]                Discharge Exam: Danley Danker Weights   03/22/22 2111 03/23/22 0100 03/25/22 0403  Weight: 111.1 kg 112.7 kg 109 kg   General exam: Alert, awake, oriented x 3; in no major distress; no shortness of breath, no chest pain, no palpitations, no nausea or vomiting.  Patient reports he would like to go home. Respiratory system: Clear to auscultation. Respiratory effort normal.  Good saturation on room air. Cardiovascular system: Irregular, rate controlled, no rubs, no gallops; no JVD on exam. Gastrointestinal system: Abdomen is obese, nondistended, soft and nontender. No organomegaly or masses felt. Normal bowel sounds heard. Central nervous system: Alert and oriented. No focal neurological deficits. Extremities: No cyanosis or clubbing. Skin: No petechiae. Psychiatry: Judgement and insight appear normal. Mood & affect appropriate.    Condition at discharge: Stable and improved.  The results of significant diagnostics from this hospitalization (including imaging, microbiology, ancillary and laboratory) are listed below for reference.   Imaging Studies: US Abdomen Limited RUQ (LIVER/GB)  Result Date: 03/23/2022 CLINICAL DATA:  Transaminitis EXAM: ULTRASOUND ABDOMEN LIMITED RIGHT UPPER QUADRANT COMPARISON:  Abdominal ultrasound dated 04/10/2021 FINDINGS: Gallbladder: No gallstones or wall thickening visualized. No sonographic Murphy sign noted by sonographer. Common bile duct: Diameter: 3 mm Liver: No focal lesion identified. Liver echogenicity is increased and liver echotexture is heterogeneous. Portal vein is patent on color Doppler imaging with normal direction of blood flow towards the liver. Other: None. IMPRESSION: 1. Increased echogenicity and heterogeneous echotexture of the liver may represent hepatic steatosis and/or other hepatocellular disease. 2. No evidence of cholelithiasis or acute  cholecystitis. Electronically Signed   By: Zerita Boers M.D.   On: 03/23/2022 09:46   DG Chest Portable 1 View  Result Date: 03/22/2022 CLINICAL DATA:  Difficulty breathing, atrial fibrillation EXAM: PORTABLE CHEST 1 VIEW COMPARISON:  10/23/2021 FINDINGS: Single frontal view of the chest demonstrates an enlarged cardiac silhouette. No airspace disease, effusion, or pneumothorax. Mild central vascular congestion. No acute bony abnormalities. IMPRESSION: 1. Enlarged cardiac silhouette with mild central vascular congestion. No overt edema. Electronically Signed   By: Randa Ngo M.D.   On: 03/22/2022 21:52    Microbiology: Results for orders placed or performed during the hospital encounter of 03/22/22  MRSA Next Gen by PCR, Nasal     Status: None   Collection Time: 03/23/22  8:29 AM   Specimen: Nasal Mucosa; Nasal Swab  Result Value Ref Range Status   MRSA by PCR Next Gen NOT DETECTED NOT  DETECTED Final    Comment: (NOTE) The GeneXpert MRSA Assay (FDA approved for NASAL specimens only), is one component of a comprehensive MRSA colonization surveillance program. It is not intended to diagnose MRSA infection nor to guide or monitor treatment for MRSA infections. Test performance is not FDA approved in patients less than 26 years old. Performed at [facility], [address], [place], [place] [address]     Labs: CBC: Recent Labs  Lab 03/22/22 2135 03/23/22 0350 03/24/22 0334  WBC 5.5 4.4 5.5  NEUTROABS 2.8 2.2  --   HGB 13.9 13.4 14.5  HCT 40.2 39.6 41.7  MCV 94.8 97.8 93.9  PLT 83* 65* 60*   Basic Metabolic Panel: Recent Labs  Lab 03/22/22 2135 03/23/22 0350 03/24/22 0334 03/25/22 0354  NA 141 142 133* 134*  K 4.1 4.1 3.5 4.5  CL 107 108 101 105  CO2 23 23 24  20*  GLUCOSE 127* 129* 115* 116*  BUN 11 9 6 8   CREATININE 0.65 0.58* 0.48* 0.67  CALCIUM 8.6* 8.0* 8.2* 8.9  MG 2.0 1.9 2.2  --   PHOS  --  3.0  --   --    Liver Function Tests: Recent Labs  Lab  03/22/22 2135 03/23/22 0350  AST 247* 205*  ALT 104* 92*  ALKPHOS 97 89  BILITOT 0.7 0.7  PROT 7.7 7.0  ALBUMIN 4.1 3.6   CBG: No results for input(s): "GLUCAP" in the last 168 hours.  Discharge time spent: greater than 30 minutes.  Signed: Barton Dubois, MD [facility] 03/25/2022

## 2022-03-25 NOTE — Progress Notes (Signed)
Pt lying in bed. Denies pain or SOB. HR is sustaining 130-170s a-fib. BP is 161/102. Dr. Clearence Ped notified. Will give diltiazem 15mg  IV.

## 2022-06-04 ENCOUNTER — Telehealth: Payer: Self-pay | Admitting: Cardiology

## 2022-06-04 NOTE — Telephone Encounter (Signed)
Wife is calling to get the info to be able to get he Eliquis mail to the new address. Please advise

## 2022-06-04 NOTE — Telephone Encounter (Signed)
Provided wife with [employer] Customer Service number: [phone].

## 2022-07-12 ENCOUNTER — Ambulatory Visit: Payer: Medicaid Other | Attending: Cardiology | Admitting: Cardiology

## 2022-07-12 ENCOUNTER — Encounter: Payer: Self-pay | Admitting: Cardiology

## 2022-07-12 VITALS — BP 124/82 | HR 76 | Ht 69.0 in | Wt 272.0 lb

## 2022-07-12 DIAGNOSIS — I422 Other hypertrophic cardiomyopathy: Secondary | ICD-10-CM | POA: Insufficient documentation

## 2022-07-12 DIAGNOSIS — I4891 Unspecified atrial fibrillation: Secondary | ICD-10-CM | POA: Insufficient documentation

## 2022-07-12 MED ORDER — METOPROLOL TARTRATE 50 MG PO TABS: 50.0000 mg | ORAL_TABLET | Freq: Two times a day (BID) | ORAL | 3 refills | Status: DC

## 2022-07-12 NOTE — Patient Instructions (Addendum)
Medication Instructions:  Your physician has recommended you make the following change in your medication:  - Start Lopressor 50 mg tablets twice daily   Labwork: CBC/BMET  Testing/Procedures: Your physician has requested that you have a cardiac MRI. Cardiac MRI uses a computer to create images of your heart as its beating, producing both still and moving pictures of your heart and major blood vessels. For further information please visit [email or web address] Please follow the instruction sheet given to you today for more information.   Follow-Up: Follow up with Dr. Harl Bowie in 6 months.  Any Other Special Instructions Will Be Listed Below (If Applicable).     If you need a refill on your cardiac medications before your next appointment, please call your pharmacy.     You are scheduled for Cardiac MRI on ______________. Please arrive for your appointment at ______________ ( arrive 30-45 minutes prior to test start time). ?  [facility] [address] [place], [place] [address] [phone] Please take advantage of the free valet parking available at the MAIN entrance (A entrance).  Proceed to the [facility] [place] [facility] Radiology Department (First Floor) for check-in.   [facility] [place], [place] [address] [phone] Please take advantage of the free valet parking available at the MAIN entrance. Proceed to [facility] registration for check-in (first floor).  Magnetic resonance imaging (MRI) is a painless test that produces images of the inside of the body without using Xrays.  During an MRI, strong magnets and radio waves work together in a Research officer, political party to form detailed images.   MRI images may provide more details about a medical condition than X-rays, CT scans, and ultrasounds can provide.  You may be given earphones to listen for instructions.  You may eat a light breakfast and take medications as ordered with the  exception of furosemide, hydrochlorothiazide, or spironolactone(fluid pill, other). Please avoid stimulants for 12 hr prior to test. (Ie. Caffeine, nicotine, chocolate, or antihistamine medications)  If a contrast material will be used, an IV will be inserted into one of your veins. Contrast material will be injected into your IV. It will leave your body through your urine within a day. You may be told to drink plenty of fluids to help flush the contrast material out of your system.  You will be asked to remove all metal, including: Watch, jewelry, and other metal objects including hearing aids, hair pieces and dentures. Also wearable glucose monitoring systems (ie. Freestyle Libre and Omnipods) (Braces and fillings normally are not a problem.)   TEST WILL TAKE APPROXIMATELY 1 HOUR  PLEASE NOTIFY SCHEDULING AT LEAST 24 HOURS IN ADVANCE IF YOU ARE UNABLE TO KEEP YOUR APPOINTMENT. [phone]  Please call Marchia Bond, cardiac imaging nurse navigator with any questions/concerns. Marchia Bond RN Navigator Cardiac Imaging Gordy Clement RN Navigator Cardiac Imaging [facility] Heart and Vascular Services [phone] Office

## 2022-07-12 NOTE — Progress Notes (Signed)
Clinical Summary Omar Mccarthy is a 32 y.o.male  1.PAF - - diagnosed initially during admission 12/2019 in setting of severe EtoH withdrawal and sepsis. Difficultly with rate control, he had outpatient DCCV 02/24/20  -  With liver disease, poor compliance,  and chronic EtoH use and unstable electrolytes poor candidate for any antiarrhythmic.  - careful use of anticoag given prior thrombocytopenia  - DCCV 12/11/21 - repeat admit 02/2022 with afib with RVR in setting of EtoH intoxication - managed with rate control at that time  - some palpitations at times, often at work with heavy activity - 3 times a week, few minutes.  - off EtoH since October - no bleeding on eliquis.  - appears only taking lopressor 13m daily as opposed to bid   2. LVOT gradient/Possible HOCM - rates too fast yesterday and limited echo images to get good assessment, would repeat as outpatient - could consider cMRI in the future  3. Orthostatic dizziness - powerade 6064mx 4, rare soda.  Past Medical History:  Diagnosis Date   Alcohol abuse    Anxiety    [place]   a. diagnosed in 12/2019 with successful DCCV in 01/2020 b. recurrence in 09/2021 in the setting of significant alcohol use and successful DCCV in 11/2021   Depression    Hypertension      No Known Allergies   Current Outpatient Medications  Medication Sig Dispense Refill   acetaminophen (TYLENOL) 500 MG tablet Take 1,000 mg by mouth in the morning and at bedtime.     apixaban (ELIQUIS) 5 MG TABS tablet Take 1 tablet (5 mg total) by mouth 2 (two) times daily. 60 tablet 2   chlordiazePOXIDE (LIBRIUM) 5 MG capsule Take 2 tablets by mouth 3 times a day x2 days; then 2 tablets by mouth twice a day x2 days; then 1 tablet by mouth 3 times a day x2 days; then 1 tablet by mouth twice a day x2 days; then 1 tablet by mouth daily x3 days and stop librium. 35 capsule 0   diltiazem (CARDIZEM CD) 240 MG 24 hr capsule Take 1 capsule  (240 mg total) by mouth daily. 30 capsule 3   folic acid (FOLVITE) 1 MG tablet Take 1 tablet (1 mg total) by mouth daily. 30 tablet 2   metoprolol tartrate (LOPRESSOR) 50 MG tablet Take 1 tablet (50 mg total) by mouth 2 (two) times daily. 60 tablet 2   nicotine (NICODERM CQ - DOSED IN MG/24 HOURS) 21 mg/24hr patch Place 1 patch (21 mg total) onto the skin daily. 28 patch 0   pantoprazole (PROTONIX) 40 MG tablet Take 1 tablet (40 mg total) by mouth 2 (two) times daily. 60 tablet 2   thiamine (VITAMIN B1) 100 MG/ML injection Inject 1 mL (100 mg total) into the vein daily. 30 mL 2   No current facility-administered medications for this visit.     Past Surgical History:  Procedure Laterality Date   CARDIOVERSION N/A 02/24/2020   Procedure: CARDIOVERSION;  Surgeon: McSatira SarkMD;  [facility];  Service: Cardiovascular;  Laterality: N/A;   CARDIOVERSION N/A 12/12/2021   Procedure: CARDIOVERSION;  Surgeon: BrArnoldo LenisMD;  Location: [facility] ORS;  Service: Endoscopy;  Laterality: N/A;     No Known Allergies    Family History  Problem Relation Age of Onset   High blood pressure Mother    High Cholesterol Mother    Colon cancer Mother  High blood pressure Father    High Cholesterol Father      Social History Mr. Betzer reports that he has been smoking cigarettes. He has been smoking an average of 1 pack per day. He has never used smokeless tobacco. Mr. Marien reports that he does not currently use alcohol.   Review of Systems CONSTITUTIONAL: No weight loss, fever, chills, weakness or fatigue.  HEENT: Eyes: No visual loss, blurred vision, double vision or yellow sclerae.No hearing loss, sneezing, congestion, runny nose or sore throat.  SKIN: No rash or itching.  CARDIOVASCULAR: per hpi RESPIRATORY: No shortness of breath, cough or sputum.  GASTROINTESTINAL: No anorexia, nausea, vomiting or diarrhea. No abdominal pain or blood.  GENITOURINARY: No burning on  urination, no polyuria NEUROLOGICAL: No headache, dizziness, syncope, paralysis, ataxia, numbness or tingling in the extremities. No change in bowel or bladder control.  MUSCULOSKELETAL: No muscle, back pain, joint pain or stiffness.  LYMPHATICS: No enlarged nodes. No history of splenectomy.  PSYCHIATRIC: No history of depression or anxiety.  ENDOCRINOLOGIC: No reports of sweating, cold or heat intolerance. No polyuria or polydipsia.  Marland Kitchen   Physical Examination Today's Vitals   07/12/22 1537  BP: 124/82  Pulse: 76  SpO2: 97%  Weight: 272 lb (123.4 kg)  Height: 5' 9"$  (1.753 m)   Body mass index is 40.17 kg/m.  Gen: resting comfortably, no acute distress HEENT: no scleral icterus, pupils equal round and reactive, no palptable cervical adenopathy,  CV: RRR, no mrg, no jvd Resp: Clear to auscultation bilaterally GI: abdomen is soft, non-tender, non-distended, normal bowel sounds, no hepatosplenomegaly MSK: extremities are warm, no edema.  Skin: warm, no rash Neuro:  no focal deficits Psych: appropriate affect    Assessment and Plan  1.Persistent afib - essentially multiple admits with afib with RVR in setting of EtOH intoxication or withdrawal - prior cardioversions that were successful, however afib recurrences driven by [facility] - we have elected for just rate control at this time - mild symptoms at times, instructed should be taking lopressor bid as opposed to daily, monitor symptoms with change - CHADS2Vasc score of 1, he has elected to continue eliquis - repeat cbc given some thrombocytopenia in the past  2. LVH - prior echo concerning for possible HOCM, will better clarify with cMRI      Arnoldo Lenis, M.D.

## 2022-07-22 ENCOUNTER — Telehealth: Payer: Self-pay | Admitting: Cardiology

## 2022-07-22 MED ORDER — DILTIAZEM HCL ER COATED BEADS 240 MG PO CP24: 240.0000 mg | ORAL_CAPSULE | Freq: Every day | ORAL | 3 refills | Status: DC

## 2022-07-22 NOTE — Telephone Encounter (Signed)
Pt c/o medication issue:  1. Name of Medication: diltiazem (CARDIZEM CD) 240 MG 24 hr capsule   2. How are you currently taking this medication (dosage and times per day)?  Take 1 capsule (240 mg total) by mouth daily.  3. Are you having a reaction (difficulty breathing--STAT)? No   4. What is your medication issue? Please send in new prescription to  [place], [place]

## 2022-07-22 NOTE — Telephone Encounter (Signed)
Completed.

## 2022-10-04 ENCOUNTER — Telehealth (HOSPITAL_COMMUNITY): Payer: Self-pay | Admitting: *Deleted

## 2022-10-04 NOTE — Telephone Encounter (Signed)
Reaching out to patient to offer assistance regarding upcoming cardiac imaging study; pt verbalizes understanding of appt date/time, parking situation and where to check in, and verified current allergies; name and call back number provided for further questions should they arise ? ?Teddy Rebstock RN Navigator Cardiac Imaging ?[facility] ?[phone] office ?[phone] cell ? ?Patient denies metal or claustrophobia. ?

## 2022-10-07 ENCOUNTER — Ambulatory Visit (HOSPITAL_COMMUNITY): Admission: RE | Admit: 2022-10-07 | Payer: Medicaid Other | Source: Ambulatory Visit

## 2022-11-13 ENCOUNTER — Telehealth: Payer: Self-pay | Admitting: Cardiology

## 2022-11-13 MED ORDER — DILTIAZEM HCL ER COATED BEADS 240 MG PO CP24: 240.0000 mg | ORAL_CAPSULE | Freq: Every day | ORAL | 3 refills | Status: DC

## 2022-11-13 NOTE — Telephone Encounter (Signed)
Refill complete 

## 2022-11-13 NOTE — Telephone Encounter (Signed)
*  STAT* If patient is at the pharmacy, call can be transferred to refill team.   1. Which medications need to be refilled? (please list name of each medication and dose if known) diltiazem (CARDIZEM CD) 240 MG 24 hr capsule  2. Which pharmacy/location (including street and city if local pharmacy) is medication to be sent to? [facility] [address], [place] - [address]  3. Do they need a 30 day or 90 day supply?  90 day supply

## 2022-12-25 ENCOUNTER — Telehealth (HOSPITAL_COMMUNITY): Payer: Self-pay | Admitting: *Deleted

## 2022-12-25 NOTE — Telephone Encounter (Signed)
Reaching out to patient to offer assistance regarding upcoming cardiac imaging study; pt's wife verbalizes understanding of appt date/time. She just didn't know the location. Address given to her. She is aware the patient is to arrive at 11:30 AM.  Larey Brick RN Navigator Cardiac Imaging [facility] [phone] office [phone] cell

## 2022-12-26 ENCOUNTER — Ambulatory Visit (HOSPITAL_COMMUNITY): Admission: RE | Admit: 2022-12-26 | Payer: MEDICAID | Source: Ambulatory Visit

## 2023-01-01 ENCOUNTER — Other Ambulatory Visit: Payer: Self-pay | Admitting: Cardiology

## 2023-01-01 ENCOUNTER — Ambulatory Visit (HOSPITAL_COMMUNITY)
Admission: RE | Admit: 2023-01-01 | Discharge: 2023-01-01 | Disposition: A | Discharge status: Home / Self Care | Payer: MEDICAID | Source: Ambulatory Visit | Attending: Cardiology | Admitting: Cardiology

## 2023-01-01 DIAGNOSIS — I422 Other hypertrophic cardiomyopathy: Secondary | ICD-10-CM

## 2023-01-01 MED ORDER — GADOBUTROL 1 MMOL/ML IV SOLN
10.0000 mL | Freq: Once | INTRAVENOUS | Status: AC | PRN
  Administered 2023-01-01: 10 mL via INTRAVENOUS

## 2023-01-14 ENCOUNTER — Telehealth: Payer: Self-pay

## 2023-01-14 NOTE — Telephone Encounter (Signed)
-----   Message from Nurse Milas Kocher sent at 01/10/2023  4:40 PM EDT -----  ----- Message ----- From: Antoine Poche, MD Sent: 01/08/2023  11:33 AM EDT To: Lesle Chris, LPN  Cardiac MRI does suggest some abnormal thickening of the heart muscle suggesting something called hypertropic cardiomyopathy. We will discuss in detail at our appt next month  Dominga Ferry MD

## 2023-01-14 NOTE — Telephone Encounter (Signed)
Patient notified and verbalized understanding. Patient had no questions or concerns at this time. Pt stated he does not have a PCP at this time.

## 2023-02-06 ENCOUNTER — Ambulatory Visit: Payer: MEDICAID

## 2023-02-06 ENCOUNTER — Ambulatory Visit: Payer: MEDICAID | Attending: Cardiology | Admitting: Cardiology

## 2023-02-06 ENCOUNTER — Encounter: Payer: Self-pay | Admitting: Cardiology

## 2023-02-06 VITALS — BP 106/70 | HR 78 | Ht 69.0 in | Wt 272.0 lb

## 2023-02-06 DIAGNOSIS — I4891 Unspecified atrial fibrillation: Secondary | ICD-10-CM

## 2023-02-06 DIAGNOSIS — I421 Obstructive hypertrophic cardiomyopathy: Secondary | ICD-10-CM

## 2023-02-06 NOTE — Patient Instructions (Signed)
Medication Instructions:  Your physician recommends that you continue on your current medications as directed. Please refer to the Current Medication list given to you today.  *If you need a refill on your cardiac medications before your next appointment, please call your pharmacy*   Lab Work: None If you have labs (blood work) drawn today and your tests are completely normal, you will receive your results only by: MyChart Message (if you have MyChart) OR A paper copy in the mail If you have any lab test that is abnormal or we need to change your treatment, we will call you to review the results.   Testing/Procedures: Zio Monitor   Follow-Up: At [facility], you and your health needs are our priority.  As part of our continuing mission to provide you with exceptional heart care, we have created designated Provider Care Teams.  These Care Teams include your primary Cardiologist (physician) and Advanced Practice Providers (APPs -  Physician Assistants and Nurse Practitioners) who all work together to provide you with the care you need, when you need it.  We recommend signing up for the patient portal called "MyChart".  Sign up information is provided on this After Visit Summary.  MyChart is used to connect with patients for Virtual Visits (Telemedicine).  Patients are able to view lab/test results, encounter notes, upcoming appointments, etc.  Non-urgent messages can be sent to your provider as well.   To learn more about what you can do with MyChart, go to [email or web address].    Your next appointment:   3 month(s)  Provider:   You may see Dina Rich, MD or one of the following Advanced Practice Providers on your designated Care Team:   Randall An, PA-C  Jacolyn Reedy, [place]     Other Instructions Christena Deem- Long Term Monitor Instructions   Your physician has requested you wear your ZIO patch monitor 24 hours..   This is a single patch monitor.  [employer]  supplies one patch monitor per enrollment.  Additional stickers are not available.   Please do not apply patch if you will be having a Nuclear Stress Test, Echocardiogram, Cardiac CT, MRI, or Chest Xray during the time frame you would be wearing the monitor. The patch cannot be worn during these tests.  You cannot remove and re-apply the ZIO XT patch monitor.   Your ZIO patch monitor will be sent USPS Priority mail from [facility] directly to your home address. The monitor may also be mailed to a PO BOX if home delivery is not available.   It may take 3-5 days to receive your monitor after you have been enrolled.   Once you have received you monitor, please review enclosed instructions.  Your monitor has already been registered assigning a specific monitor serial # to you.   Applying the monitor   Shave hair from upper left chest.   Hold abrader disc by orange tab.  Rub abrader in 40 strokes over left upper chest as indicated in your monitor instructions.   Clean area with 4 enclosed alcohol pads .  Use all pads to assure are is cleaned thoroughly.  Let dry.   Apply patch as indicated in monitor instructions.  Patch will be place under collarbone on left side of chest with arrow pointing upward.   Rub patch adhesive wings for 2 minutes.Remove white label marked "1".  Remove white label marked "2".  Rub patch adhesive wings for 2 additional minutes.   While looking in a mirror,  press and release button in center of patch.  A small green light will flash 3-4 times .  This will be your only indicator the monitor has been turned on.     Do not shower for the first 24 hours.  You may shower after the first 24 hours.   Press button if you feel a symptom. You will hear a small click.  Record Date, Time and Symptom in the Patient Log Book.   When you are ready to remove patch, follow instructions on last 2 pages of Patient Log Book.  Stick patch monitor onto last page of Patient Log  Book.   Place Patient Log Book in [place] box.  Use locking tab on box and tape box closed securely.  The [employer] has [employer] on it.  Please place in mailbox as soon as possible.  Your physician should have your test results approximately 7 days after the monitor has been mailed back to [facility].   Call [facility] Customer Care at [phone] if you have questions regarding your ZIO XT patch monitor.  Call them immediately if you see an orange light blinking on your monitor.   If your monitor falls off in less than 4 days contact our Monitor department at [phone].  If your monitor becomes loose or falls off after 4 days call [employer] at [phone] for suggestions on securing your monitor.

## 2023-02-06 NOTE — Progress Notes (Signed)
Clinical Summary Mr. Omar Mccarthy is a 32 y.o.male seen today for follow up of the following medical rpoblems.   1.PAF - - diagnosed initially during admission 12/2019 in setting of severe EtoH withdrawal and sepsis. Difficultly with rate control, he had outpatient DCCV 02/24/20  -  With liver disease, poor compliance,  and chronic EtoH use and unstable electrolytes poor candidate for any antiarrhythmic.  - careful use of anticoag given prior thrombocytopenia   - DCCV 12/11/21 - repeat admit 02/2022 with afib with RVR in setting of EtoH intoxication - managed with rate control at that time    - no specific palpitations - compliant with meds - no bleeding on eliquis - has stopped drinking.     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   12/2022 cMRI: severe septal hypertrophy  with basal anteroseptum 1.9 cm and mid 1.6 cm.  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 Septal thickness 19 mm, with LGE burden < 10%. No apical aneurysm.  Symptoms: - he is on metoprolol 50mg  bid, diltiazem 240mg  daily already for his Afib - some SOB at times, variable.Some lightheadedness at times, often with bending down.  - working to stay hydrated,   ICD risk eval: LGE <15%, wall thickness <3 cm, no family SCD, no unexplained syncope. Needs to wear a home monitor.     Family screening:  Daughter is 63 years old - we discussed he needs to notify her pediatrician. We discussed genetic testing and he will let us know if he is interested.        SH: works Development worker, international aid Past Medical History:  Diagnosis Date   Alcohol abuse    Anxiety    Atrial fibrillation (HCC)    a. diagnosed in 12/2019 with successful DCCV in 01/2020 b. recurrence in 09/2021 in the setting of significant alcohol use and successful DCCV in 11/2021   Depression    Hypertension      No Known  Allergies   Current Outpatient Medications  Medication Sig Dispense Refill   acetaminophen (TYLENOL) 500 MG tablet Take 1,000 mg by mouth in the morning and at bedtime.     apixaban (ELIQUIS) 5 MG TABS tablet Take 1 tablet (5 mg total) by mouth 2 (two) times daily. 60 tablet 2   diltiazem (CARDIZEM CD) 240 MG 24 hr capsule Take 1 capsule (240 mg total) by mouth daily. 90 capsule 3   metoprolol tartrate (LOPRESSOR) 50 MG tablet Take 1 tablet (50 mg total) by mouth 2 (two) times daily. 180 tablet 3   No current facility-administered medications for this visit.     Past Surgical History:  Procedure Laterality Date   CARDIOVERSION N/A 02/24/2020   Procedure: CARDIOVERSION;  Surgeon: Jonelle Sidle, MD;  [facility];  Service: Cardiovascular;  Laterality: N/A;   CARDIOVERSION N/A 12/12/2021   Procedure: CARDIOVERSION;  Surgeon: Antoine Poche, MD;  Location: [facility] ORS;  Service: Endoscopy;  Laterality: N/A;     No Known Allergies    Family History  Problem Relation Age of Onset   High blood pressure Mother    High Cholesterol Mother    Colon cancer Mother    High blood pressure Father    High Cholesterol Father      Social History Mr. Roper reports that he has been smoking cigarettes. He has never used smokeless tobacco. Mr. Kratzer reports that he does not currently use  alcohol.   Review of Systems CONSTITUTIONAL: No weight loss, fever, chills, weakness or fatigue.  HEENT: Eyes: No visual loss, blurred vision, double vision or yellow sclerae.No hearing loss, sneezing, congestion, runny nose or sore throat.  SKIN: No rash or itching.  CARDIOVASCULAR: per hpi RESPIRATORY: No shortness of breath, cough or sputum.  GASTROINTESTINAL: No anorexia, nausea, vomiting or diarrhea. No abdominal pain or blood.  GENITOURINARY: No burning on urination, no polyuria NEUROLOGICAL: No headache, dizziness, syncope, paralysis, ataxia, numbness or tingling in the extremities.  No change in bowel or bladder control.  MUSCULOSKELETAL: No muscle, back pain, joint pain or stiffness.  LYMPHATICS: No enlarged nodes. No history of splenectomy.  PSYCHIATRIC: No history of depression or anxiety.  ENDOCRINOLOGIC: No reports of sweating, cold or heat intolerance. No polyuria or polydipsia.  Marland Kitchen   Physical Examination Today's Vitals   02/06/23 1533  BP: 106/70  Pulse: 78  SpO2: 99%  Weight: 272 lb (123.4 kg)  Height: 5\' 9"  (1.753 m)   Body mass index is 40.17 kg/m.  Gen: resting comfortably, no acute distress HEENT: no scleral icterus, pupils equal round and reactive, no palptable cervical adenopathy,  CV: irreg, no m/rg, no jvd Resp: Clear to auscultation bilaterally GI: abdomen is soft, non-tender, non-distended, normal bowel sounds, no hepatosplenomegaly MSK: extremities are warm, no edema.  Skin: warm, no rash Neuro:  no focal deficits Psych: appropriate affect   Diagnostic Studies     Assessment and Plan  1.Persistent afib - essentially multiple admits with afib with RVR in setting of EtOH intoxication or withdrawal - prior cardioversions that were successful, however afib recurrences driven by [facility] - we have elected for just rate control at this time -doing well on current therapy - afib with HOCM needs to continue anticoagulation with eliquis regardless of CHADS2Vasc score.     - repeat cbc given some thrombocytopenia in the past   2. HOCM - suspected by echo, his cMRI is suggestive of HOCM - ICD screening as reported above, no detected risk factors as of yet. Will wear a 24 hr holter. - discussed screening, he has a 11 year old daughter. He will make her pediatrician aware of his diagnosis. We discussed genetic testing and he will think it over and let us know.  - already on beta blocker and CCB for his afib, will work more toward aggressive hydration and monitor symptoms.       Antoine Poche, M.D.

## 2023-02-13 ENCOUNTER — Telehealth: Payer: Self-pay | Admitting: Cardiology

## 2023-02-13 ENCOUNTER — Encounter: Payer: Self-pay | Admitting: Cardiology

## 2023-02-13 NOTE — Telephone Encounter (Signed)
Omar Mccarthy with [employer] is calling to report abnormal Zio monitor results.  Phone#: [phone] (reference #: [id number])

## 2023-02-13 NOTE — Telephone Encounter (Signed)
Spoke with Zollie Scale with [employer]. Pt has rapid A-Fib with a Hr of 185 bpm lasting 60 sec. Report to be uploaded for review.

## 2023-02-17 MED ORDER — METOPROLOL TARTRATE 50 MG PO TABS: 75.0000 mg | ORAL_TABLET | Freq: Two times a day (BID) | ORAL | 3 refills | Status: DC

## 2023-02-17 NOTE — Telephone Encounter (Signed)
Patient notified and verbalized understanding. Pt had no questions or concerns at this time

## 2023-02-17 NOTE — Telephone Encounter (Signed)
Monitor shows he is in afib all the time which is ok, did have some high heart rates at times. Would increase his lopressor to 75mg  bid. There were no other significant abnormal rhythms noted on monitor  Dominga Ferry MD

## 2023-02-27 ENCOUNTER — Telehealth: Payer: Self-pay | Admitting: Cardiology

## 2023-02-27 NOTE — Telephone Encounter (Signed)
Pt c/o medication issue:  1. Name of Medication: metoprolol tartrate (LOPRESSOR) 50 MG tablet   2. How are you currently taking this medication (dosage and times per day)? Take 1.5 tablets (75 mg total) by mouth 2 (two) times daily.   3. Are you having a reaction (difficulty breathing--STAT)? N o  4. What is your medication issue? Pt's wife states that the pt needs the medication recalled in for the 75 mg that was advised for him to take per the phone note on 9/19. Please advise

## 2023-02-27 NOTE — Telephone Encounter (Signed)
Spoke with pt who states that he needs Lopressor 75 mg two times daily called in. Informed pt that since he just refilled medication that insurance has him use the current supply he has. Pt voiced understanding and will call pharmacy when he has 1 wk of medication left for a refill.

## 2023-03-21 ENCOUNTER — Telehealth: Payer: Self-pay | Admitting: Cardiology

## 2023-03-21 NOTE — Telephone Encounter (Signed)
Pt's wife states she needs to talk with a nurse. Please advise

## 2023-03-21 NOTE — Telephone Encounter (Signed)
Patients wife stated that patient needed to reapply for PAF for BMS. Pt's wife will come Monday and get an application to re-apply for assistance. Pts wife had no further questions or concerns at this time.

## 2023-03-25 ENCOUNTER — Other Ambulatory Visit: Payer: Self-pay

## 2023-03-25 MED ORDER — APIXABAN 5 MG PO TABS: 5.0000 mg | ORAL_TABLET | Freq: Two times a day (BID) | ORAL | 0 refills | Status: DC

## 2023-05-22 ENCOUNTER — Other Ambulatory Visit: Payer: Self-pay | Admitting: Cardiology

## 2023-06-18 IMAGING — DX DG CHEST 1V PORT
1 series · 1 of 1 positions shown · non-contrast
Comparison: 04/09/2021

CLINICAL DATA: Shortness of breath, palpitations

EXAM:
PORTABLE CHEST 1 VIEW

[chest ap]
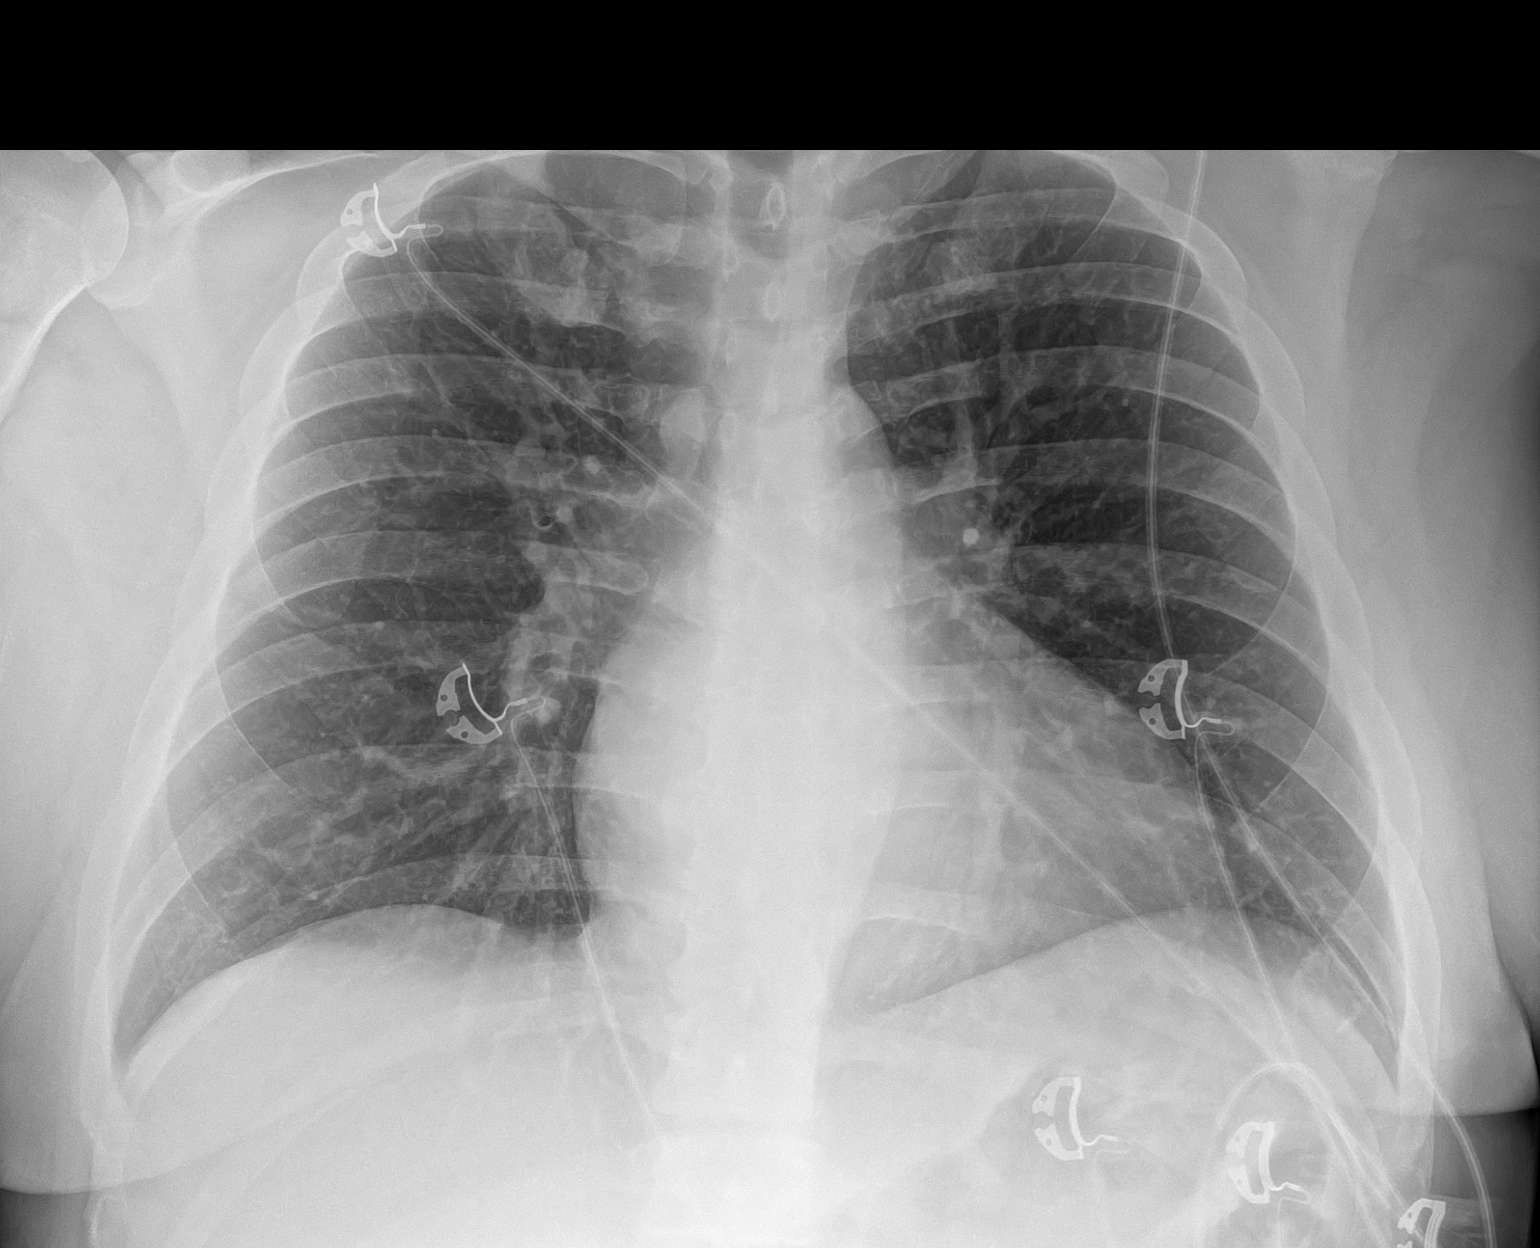

[1 of 1 positions shown; findings below may reference images not displayed]

FINDINGS: Lungs are clear.  No pleural effusion or pneumothorax.

The heart is normal in size.
IMPRESSION: No evidence of acute cardiopulmonary disease.

## 2023-06-20 ENCOUNTER — Ambulatory Visit: Payer: MEDICAID | Admitting: Cardiology

## 2023-08-25 ENCOUNTER — Telehealth: Payer: Self-pay | Admitting: Cardiology

## 2023-08-25 MED ORDER — METOPROLOL TARTRATE 50 MG PO TABS: 50.0000 mg | ORAL_TABLET | Freq: Two times a day (BID) | ORAL | 0 refills | Status: DC

## 2023-08-25 MED ORDER — DILTIAZEM HCL ER COATED BEADS 240 MG PO CP24: 240.0000 mg | ORAL_CAPSULE | Freq: Every day | ORAL | 0 refills | Status: DC

## 2023-08-25 NOTE — Telephone Encounter (Signed)
 Omar Mccarthy, Can you call and talk to patient about patient assistance.  Thanks

## 2023-08-25 NOTE — Telephone Encounter (Signed)
 Pt c/o medication issue:  1. Name of Medication:  apixaban (ELIQUIS) 5 MG TABS tablet   2. How are you currently taking this medication (dosage and times per day)?    3. Are you having a reaction (difficulty breathing--STAT)? no  4. What is your medication issue? Calling about getting medication through [employer]. Please advise

## 2023-08-25 NOTE — Telephone Encounter (Signed)
*  STAT* If patient is at the pharmacy, call can be transferred to refill team.   1. Which medications need to be refilled? (please list name of each medication and dose if known) diltiazem (CARDIZEM CD) 240 MG 24 hr capsule   metoprolol tartrate (LOPRESSOR) 50 MG tablet   2. Which pharmacy/location (including street and city if local pharmacy) is medication to be sent to? [facility] [address] [place] - [address]   3. Do they need a 30 day or 90 day supply? 90

## 2023-08-26 ENCOUNTER — Other Ambulatory Visit (HOSPITAL_COMMUNITY): Payer: Self-pay

## 2023-08-26 ENCOUNTER — Other Ambulatory Visit: Payer: Self-pay

## 2023-08-26 MED ORDER — DILTIAZEM HCL ER COATED BEADS 240 MG PO CP24: 240.0000 mg | ORAL_CAPSULE | Freq: Every day | ORAL | 0 refills | Status: DC

## 2023-08-26 MED ORDER — METOPROLOL TARTRATE 50 MG PO TABS: 50.0000 mg | ORAL_TABLET | Freq: Two times a day (BID) | ORAL | 0 refills | Status: DC

## 2023-08-26 NOTE — Telephone Encounter (Signed)
 Left detailed message on pt's vm (DPR). Informed pt to call office if pt/spouse had questions or concerns.

## 2023-08-26 NOTE — Telephone Encounter (Signed)
 Patient is not medicare or uninsured so would not be appropriate for them to apply to BMS. Based on test claim, patient has a $4 copay for Eliquis.

## 2023-09-11 ENCOUNTER — Ambulatory Visit: Payer: MEDICAID | Attending: Cardiology | Admitting: Cardiology

## 2023-09-11 ENCOUNTER — Encounter: Payer: Self-pay | Admitting: Cardiology

## 2023-09-11 VITALS — BP 106/70 | HR 69 | Ht 68.0 in | Wt 287.6 lb

## 2023-09-11 DIAGNOSIS — I4891 Unspecified atrial fibrillation: Secondary | ICD-10-CM

## 2023-09-11 DIAGNOSIS — I422 Other hypertrophic cardiomyopathy: Secondary | ICD-10-CM

## 2023-09-11 DIAGNOSIS — D6869 Other thrombophilia: Secondary | ICD-10-CM | POA: Diagnosis not present

## 2023-09-11 NOTE — Progress Notes (Signed)
 Clinical Summary Omar Mccarthy is a 33 y.o.male seen today for follow up of the following medical rpoblems.    1.PAF - - diagnosed initially during admission 12/2019 in setting of severe EtoH withdrawal and sepsis. Difficultly with rate control, he had outpatient DCCV 02/24/20  -  With liver disease, poor compliance,  and chronic EtoH use and unstable electrolytes poor candidate for any antiarrhythmic.  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    12/2022 cMRI: severe septal hypertrophy  with basal anteroseptum 1.9 cm and mid 1.6 cm.  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 Septal thickness 19 mm, with LGE burden < 10%. No apical aneurysm.   Symptoms: - he is on metoprolol 50mg  bid, diltiazem 240mg  daily already for his Afib - no recent SOB/DOE, no chest pains. No presnycope or syncope.    ICD risk eval: LGE <15%, wall thickness <3 cm, no family SCD, no unexplained syncope. 01/2023 monitor without NSVT or VT   Family screening:  Daughter is 56 years old - we discussed he needs to notify her pediatrician. We discussed genetic testing and he will let us know if he is interested.         SH: works Development worker, international aid Past Medical History:  Diagnosis Date   Alcohol abuse    Anxiety    Atrial fibrillation (HCC)    a. diagnosed in 12/2019 with successful DCCV in 01/2020 b. recurrence in 09/2021 in the setting of significant alcohol use and successful DCCV in 11/2021   Depression    Hypertension      No Known Allergies   Current Outpatient Medications  Medication Sig Dispense Refill   acetaminophen  (TYLENOL) 500 MG tablet Take 1,000 mg by mouth in the morning and at bedtime.     apixaban (ELIQUIS) 5 MG TABS tablet Take 1 tablet (5 mg total) by mouth 2 (two) times daily. 60 tablet 2   apixaban (ELIQUIS) 5 MG TABS tablet Take 1 tablet (5 mg total) by mouth 2 (two) times daily. 28 tablet 0   diltiazem (CARDIZEM CD) 240 MG 24 hr capsule Take 1 capsule (240 mg total) by mouth daily. 30 capsule 0   metoprolol tartrate (LOPRESSOR) 50 MG tablet Take 1 tablet (50 mg total) by mouth 2 (two) times daily. 60 tablet 0   No current facility-administered medications for this visit.     Past Surgical History:  Procedure Laterality Date   CARDIOVERSION N/A 02/24/2020   Procedure: CARDIOVERSION;  Surgeon: Jonelle Sidle, MD;  Location: [facility];  Service: Cardiovascular;  Laterality: N/A;   CARDIOVERSION N/A 12/12/2021   Procedure: CARDIOVERSION;  Surgeon: Antoine Poche, MD;  Location: [facility];  Service: Endoscopy;  Laterality: N/A;     No Known Allergies    Family History  Problem Relation Age of Onset   High blood pressure Mother    High Cholesterol Mother    Colon cancer Mother    High blood pressure Father    High Cholesterol Father      Social History Mr. Wiegel reports that he has been smoking cigarettes. He  has never used smokeless tobacco. Mr. Barocio reports that he does not currently use alcohol.    Physical Examination Today's Vitals   09/11/23 1038  BP: 106/70  Pulse: 69  SpO2: 99%  Weight: 287 lb 9.6 oz (130.5 kg)  Height: 5\' 8"  (1.727 m)   Body mass index is 43.73 kg/m.  Gen: resting comfortably, no acute distress HEENT: no scleral icterus, pupils equal round and reactive, no palptable cervical adenopathy,  CV: irreg, 2/6 systolic murmur rusb, no jvd Resp: Clear to auscultation bilaterally GI: abdomen is soft, non-tender, non-distended, normal bowel sounds, no hepatosplenomegaly MSK: extremities are warm, no edema.  Skin: warm, no rash Neuro:  no  focal deficits Psych: appropriate affect   Diagnostic Studies  01/2023 monitor 24 hour monitor   100% afib burden rates 55-222, avg HR 94   Rare ventricular ectopy in the form of isolated PVCs. No NSVT or VT   No symptoms reported     Patch Wear Time:  1 days and 1 hours (2024-09-12T16:17:35-0400 to 2024-09-13T18:03:07-398)   Atrial Fibrillation occurred continuously (100% burden), ranging from 55-222 bpm (avg of 94 bpm). Isolated VEs were rare (<1.0%), and no VE Couplets or VE Triplets were present. MD notification criteria for Rapid Atrial Fibrillation met - report posted  prior to notification per account request (PK).   Assessment and Plan   1.Persistent afib/acquired thrombophlia - essentially multiple admits with afib with RVR in setting of EtOH intoxication or withdrawal - prior cardioversions that were successful, however afib recurrences driven by [facility] - we have elected for just rate control at this time - afib with HOCM needs to continue anticoagulation with eliquis regardless of CHADS2Vasc score.   - no symptoms - EKG today shows rate controlled afib - continue current meds        2. HOCM - suspected by echo, his cMRI is suggestive of HOCM - ICD screening as reported above, no detected risk factors  - continue beta blocker, CCB, aggressive hydration - we will refer for genetic testing.    F/u 6 months   Laurann Pollock, M.D., F.A.C.C.

## 2023-09-11 NOTE — Patient Instructions (Addendum)
 Medication Instructions:   Continue all current medications.   Labwork:  none  Testing/Procedures:  Genetic counseling   Follow-Up:  6 months   Any Other Special Instructions Will Be Listed Below (If Applicable).  You have been referred to:  Nutrition   If you need a refill on your cardiac medications before your next appointment, please call your pharmacy.

## 2023-10-13 ENCOUNTER — Telehealth: Payer: Self-pay | Admitting: Cardiology

## 2023-10-13 MED ORDER — METOPROLOL TARTRATE 50 MG PO TABS: 75.0000 mg | ORAL_TABLET | Freq: Two times a day (BID) | ORAL | 2 refills | Status: DC

## 2023-10-13 NOTE — Telephone Encounter (Signed)
 That said if has been doing well on lopressor  75mg  bid then I'm ok to continue the dose, it helps both the heart rhythm and thickened heart muscle so a higher dose would be fine    Letta Raw MD   Called wife advised her prescription sent in to pharmacy at dose patient has been taking per Dr. Amanda Jungling verbalized understanding.

## 2023-10-13 NOTE — Telephone Encounter (Signed)
 Spoke with wife Omar Mccarthy stated he has been taking 75 mg twice daily. Hasn't been updated in chart. Currently states that he is taking 50 mg twice daily. Advised her will send to provider to verify what dosage patient needs to be on and send updated script in to pharmacy.

## 2023-10-13 NOTE — Telephone Encounter (Signed)
*  STAT* If patient is at the pharmacy, call can be transferred to refill team.   1. Which medications need to be refilled? (please list name of each medication and dose if known)  metoprolol  tartrate (LOPRESSOR ) 50 MG tablet          2. Which pharmacy/location (including street and city if local pharmacy) is medication to be sent to? [facility] [address] [place] - [address]    3. Do they need a 30 day or 90 day supply?  90 day supply  Wife says patient is almost out of medication. She says he takes 1.5 MG twice daily. She says pharmacy requested refill but it was denied because it is too soon for refill based on instructions. She says the instructions on the bottle are incorrect.

## 2023-10-15 ENCOUNTER — Encounter: Payer: MEDICAID | Attending: Cardiology | Admitting: Nutrition

## 2023-10-15 VITALS — Ht 68.0 in | Wt 286.0 lb

## 2023-10-15 DIAGNOSIS — I422 Other hypertrophic cardiomyopathy: Secondary | ICD-10-CM | POA: Insufficient documentation

## 2023-10-15 NOTE — Patient Instructions (Signed)
 Goals Focus on eating 3 meals per day Exercise 30 minutes 4 days a week. Take a MVI daily Reduce powerade and drink 3-4 bottles per day of water

## 2023-10-15 NOTE — Progress Notes (Unsigned)
 Medical Nutrition Therapy  Appointment Start time:  1100  Appointment End time:  1208 Primary concerns today: Obesity  Referral diagnosis: E66.01 Preferred learning style: No preference  Learning readiness: Ready    NUTRITION ASSESSMENT  33 yr old wmale referred for severe obesity. BMI 43. Here with his wife. History of alcoholism and weight issues nost of life. Has been in recovery for a few years now. Ready to get his health back and feel better.  He notes his weakness is sweets, sodas and sugar. Realizes his addition for sugar is dopamine related. Referred from Dr. Amanda Jungling, Cardiologist. Needs to find a PCP. Referred him to [facility]. Was diagnosed with AFIB during a hospital admission for alcoholism in the past. He likes to cook. Walks some. Wiling to look into AA and other community support groups for recovery for him and his wife. Willing to work on eating healthier, moving more and losing weight.  Clinical Wt Readings from Last 3 Encounters:  10/15/23 286 lb (129.7 kg)  09/11/23 287 lb 9.6 oz (130.5 kg)  02/06/23 272 lb (123.4 kg)   Ht Readings from Last 3 Encounters:  10/15/23 5\' 8"  (1.727 m)  09/11/23 5\' 8"  (1.727 m)  02/06/23 5\' 9"  (1.753 m)   Body mass index is 43.49 kg/m. @BMIFA @ Facility age limit for growth %iles is 20 years. Facility age limit for growth %iles is 20 years.  Medical Hx:  Past Medical History:  Diagnosis Date   Alcohol abuse    Anxiety    Atrial fibrillation (HCC)    a. diagnosed in 12/2019 with successful DCCV in 01/2020 b. recurrence in 09/2021 in the setting of significant alcohol use and successful DCCV in 11/2021   Depression    Hypertension     Medications:  Current Outpatient Medications on File Prior to Visit  Medication Sig Dispense Refill   acetaminophen  (TYLENOL ) 500 MG tablet Take 1,000 mg by mouth in the morning and at bedtime.     apixaban  (ELIQUIS ) 5 MG TABS tablet Take 1 tablet (5 mg total) by mouth 2 (two)  times daily. 60 tablet 2   diltiazem  (CARDIZEM  CD) 240 MG 24 hr capsule Take 1 capsule (240 mg total) by mouth daily. 30 capsule 0   metoprolol  tartrate (LOPRESSOR ) 50 MG tablet Take 1.5 tablets (75 mg total) by mouth 2 (two) times daily. 135 tablet 2   No current facility-administered medications on file prior to visit.    Labs:  Lab Results  Component Value Date   HGBA1C 4.7 (L) 04/10/2021      Latest Ref Rng & Units 03/25/2022    3:54 AM 03/24/2022    3:34 AM 03/23/2022    3:50 AM  CMP  Glucose 70 - 99 mg/dL 846  962  952   BUN 6 - 20 mg/dL 8  6  9    Creatinine 0.61 - 1.24 mg/dL 8.41  3.24  4.01   Sodium 135 - 145 mmol/L 134  133  142   Potassium 3.5 - 5.1 mmol/L 4.5  3.5  4.1   Chloride 98 - 111 mmol/L 105  101  108   CO2 22 - 32 mmol/L 20  24  23    Calcium 8.9 - 10.3 mg/dL 8.9  8.2  8.0   Total Protein 6.5 - 8.1 g/dL   7.0   Total Bilirubin 0.3 - 1.2 mg/dL   0.7   Alkaline Phos 38 - 126 U/L   89   AST 15 - 41  U/L   205   ALT 0 - 44 U/L   92    Lipid Panel     Component Value Date/Time   TRIG 149 01/16/2020 0523    Notable Signs/Symptoms: stays tired.  Lifestyle & Dietary Hx Marrired and has 2 kids  Estimated daily fluid intake: Powerade or coffee oz Supplements: Energy supplements; Vita packet [employer]  Sleep:  Stress / self-care: some Current average weekly physical activity: Walk some  24-Hr Dietary Recall Usually eats 1-2 meals per day. Likes sweets, sodas, poweraide and gatorade.  Estimated Energy Needs Calories: 1800 Carbohydrate: 200g Protein: 135g Fat: 50g   NUTRITION DIAGNOSIS  NI-1.7 Predicted excessive energy intake As related to High calorie diet and inconsistent food intake.  As evidenced by BMI 43.   NUTRITION INTERVENTION  Nutrition education (E-1) on the following topics:  Nutrition and Diabetes education provided on My Plate, CHO counting, meal planning, portion sizes, timing of meals, avoiding snacks between meals unless having a low  blood sugar, target ranges for A1C and blood sugars, signs/symptoms and treatment of hyper/hypoglycemia, monitoring blood sugars, taking medications as prescribed, benefits of exercising 30 minutes per day and prevention of complications of DM. Local AA and [place] programs for ongoing support for recovery. Lifestyle Medicine  - Whole Food, Plant Predominant Nutrition is highly recommended: Eat Plenty of vegetables, Mushrooms, fruits, Legumes, Whole Grains, Nuts, seeds in lieu of processed meats, processed snacks/pastries red meat, poultry, eggs.    -It is better to avoid simple carbohydrates including: Cakes, Sweet Desserts, Ice Cream, Soda (diet and regular), Sweet Tea, Candies, Chips, Cookies, Store Bought Juices, Alcohol in Excess of  1-2 drinks a day, Lemonade,  Artificial Sweeteners, Doughnuts, Coffee Creamers, "Sugar-free" Products, etc, etc.  This is not a complete list.....  Exercise: If you are able: 30 -60 minutes a day ,4 days a week, or 150 minutes a week.  The longer the better.  Combine stretch, strength, and aerobic activities.  If you were told in the past that you have high risk for cardiovascular diseases, you may seek evaluation by your heart doctor prior to initiating moderate to intense exercise programs.   Handouts Provided Include  My Plate Weight loss tips  Learning Style & Readiness for Change Teaching method utilized: Visual & Auditory  Demonstrated degree of understanding via: Teach Back  Barriers to learning/adherence to lifestyle change: none  Goals Established by Pt Goals Focus on eating 3 meals per day Exercise 30 minutes 4 days a week. Take a MVI daily Reduce powerade and drink 3-4 bottles per day of water   MONITORING & EVALUATION Dietary intake, weekly physical activity, and weight in 1 month.  Next Steps  Patient is to work on meal planning, meal prepping and exercise.Aaron Aas

## 2023-10-22 ENCOUNTER — Encounter: Payer: Self-pay | Admitting: Nutrition

## 2023-11-12 ENCOUNTER — Ambulatory Visit: Payer: MEDICAID | Admitting: Nutrition

## 2023-11-20 ENCOUNTER — Ambulatory Visit: Payer: MEDICAID | Admitting: Nutrition

## 2023-12-02 ENCOUNTER — Telehealth: Payer: Self-pay | Admitting: Cardiology

## 2023-12-02 NOTE — Telephone Encounter (Signed)
 Calling to see if we can referral the patient to a PCP. Please advise

## 2023-12-02 NOTE — Telephone Encounter (Signed)
 I spoke with Omar Mccarthy and gave her the number to [employer] [phone]

## 2023-12-25 ENCOUNTER — Ambulatory Visit: Payer: Self-pay | Admitting: Family Medicine

## 2023-12-31 ENCOUNTER — Encounter: Payer: Self-pay | Admitting: Family Medicine

## 2024-01-07 ENCOUNTER — Other Ambulatory Visit: Payer: Self-pay | Admitting: Cardiology

## 2024-02-08 ENCOUNTER — Other Ambulatory Visit: Payer: Self-pay | Admitting: Cardiology

## 2024-03-09 ENCOUNTER — Ambulatory Visit: Payer: MEDICAID | Attending: Genetic Counselor | Admitting: Genetic Counselor

## 2024-03-15 NOTE — Progress Notes (Signed)
 Pre Test Genetic Consult  Referral Reason  Omar Mccarthy is referred for genetic consult and testing of hypertrophic cardiomyopathy.   Personal Medical Information Omar Mccarthy (III.1 on pedigree) is a 33 year old Caucasian gentleman who is here today with is wife, Omar Mccarthy. He works as a Occupational hygienist. He reports that he was found to have cardiac wall thickness suggestive of HCM at age 36 when he was hospitalized for alcohol withdrawal and found to have Afib.  Recent cardiac MRI (8.2024) demonstrated severe asymmetric septal hypertrophy with obstructive physiology having basal septal thickness of 1.9 cm and less than 10% scar burden  Omar Mccarthy reports having symptoms with exertion including dyspnea, chest tightness occasional heart palpitations and light headedness. Denies having syncope.  Traditional Risk Factors Omar Mccarthy reports having HTN since the age of 56 and began treating this when he was 72.  Family history  Relation to Proband Pedigree # Current age Heart condition/age of onset Notes  Daughter IV.1 13 None Gets winded easily Echo/EKG to be done        Father II.5 60 None Echo/EKG to be done  Paternal uncle II.4 68 None   Paternal aunts, 3 II.1-II.3 61, 56, 54 None 2 sons and 1 daughter- all well  Paternal grandfather 1.1 Deceased None Died @ 75- suddenly  Paternal grandmother I.2 Deceased None Died @ 6- COPD        Mother II.6 42 None Echo/EKG to be done   Maternal aunts, 3 II.7-II.9 72, 68, 60 None   Maternal uncle II.10 Deceased None Died @ 55- suicide> incarcerated  Maternal grandfather I.3 Deceased None Died @ 77- M.I.  Maternal grandmother I.4 69 None    Genetics Omar Mccarthy was counseled on the genetics of hypertrophic cardiomyopathy (HCM). I explained to the patient that this is an autosomal dominant condition with incomplete penetrance i.e. not all individuals harboring the HCM mutation will present clinically with HCM, and age-related penetrance where clinical presentation  of HCM increases with advanced age. Variability in clinical expression is also seen in families with HCM with affected family members presenting clinically at different ages and with symptoms ranging from mild to severe.  Since HCM is an autosomal dominant condition, first degree-relatives are at a 50% risk of inheriting this condition. They should seek regular surveillance for HCM.  First-degree relatives include his parents, and daughter.   Clinical screening of first-degree relatives involves echocardiogram and EKG at regular intervals, frequency is typically determined by age, with children undergoing screening every year until the age of 15 and those over the age of 71 getting screened every 3-5 years until the age of 29. Patient verbalized understanding of this.  Also briefly discussed the inheritance pattern and treatment /management plans for the infiltrative cardiomyopathies that present as HCM phenocopies. About 8-10% of HCM patients can have compound and digenic sarcomeric mutations for HCM  Patient should be aware that genetic testing is a probabilistic test dependent upon age and severity of presentation, presence of risk factors for HCM and importantly family history of HCM or sudden death in first-degree relatives. The potential outcomes of genetic testing and subsequent management of at-risk family members is listed below-  If a mutation is not identified then it is important that he understands that HCM is a genetic condition and can be passed down to his children. All first-degree relatives should undergo regular screening for HCM.  A negative test result can be due to limitations of the genetic test.   There is also the likelihood  of identifying a "Variant of unknown significance". This result means that the variant has not been detected in a statistically significant number of HCM patients and/or functional studies have not been performed to verify its pathogenicity. This VUS can be  tested in the family to see if it segregates with disease. If a VUS is found, first-degree relatives should undergo regular clinical screening for HCM, but genetic testing for the VUS is otherwise not warranted.  If a pathogenic variant is reported, then first-degree family members can get tested for this variant. If they test positive, it is likely they will develop HCM. In light of variable expression and incomplete penetrance associated with HCM, it is not possible to predict when they will manifest clinically with HCM. It is recommended that family members that test positive for the familial pathogenic variant pursue clinical screening for HCM. Family members that test negative for the familial mutation need not pursue periodic screening for HCM, but seek care if symptoms develop.   Impression  Omar Mccarthy was found to have cardiac wall thickness suggestive of HCM at age 32 in the presence of HTN,  a cardiac loading conditions that can lead to cardiac hypertrophy. There is no family history of HCM or sudden death. It is likely he has a de novo mutation for HCM or has inherited this from one of his parents with evident reduced penetrance.  Genetic testing is recommended to confirm his diagnosis. This test should include the major sarcomeric genes involved in HCM, namely MYBPPC3, MYH7, TNNI3, TNNT2, TPM1, ACTC1, MYL2 and MYL3. It should also include the genes involved in HCM phenocopies as cardiac-predominant forms of these conditions present clinically as HCM. These include genes for Fabry disease (GLA), Danon disease (LAMP2), WPW syndrome (PRKAG2), Familial transthyretin amyloidosis (TTR) and the genes definitively associated with HCM, namely phospholamban (PLN) and desmin (DES).  In addition, patient should be aware of protections afforded by the Genetic Information Non-Discrimination Act (GINA). GINA protects a patient from losing their employment or health insurance based on their genotype. However,  these protections do not cover life insurance and disability. Explained to the patient that family members that are found to have the familial genetic mutation will be denied life insurance even if they are asymptomatic and do not exhibit clinical signs of HCM. He verbalized understanding.   Please note that the patient has not been counseled in this visit on other personal, cultural or ethical issues that he may face due to his heart condition.   Plan After a thorough discussion of the risk and benefits of genetic testing for HCM and non-insurance coverage for this test, he wishes to wait until he obtains private insurance through his work. He will follow through with genetic testing next year.     Omar Mccarthy, Ph.D, [facility] Clinical Molecular Geneticist

## 2024-03-29 ENCOUNTER — Other Ambulatory Visit: Payer: Self-pay | Admitting: Cardiology

## 2024-03-29 ENCOUNTER — Other Ambulatory Visit: Payer: Self-pay

## 2024-03-29 DIAGNOSIS — I480 Paroxysmal atrial fibrillation: Secondary | ICD-10-CM

## 2024-03-29 MED ORDER — APIXABAN 5 MG PO TABS: 5.0000 mg | ORAL_TABLET | Freq: Two times a day (BID) | ORAL | 0 refills | Status: DC

## 2024-03-29 NOTE — Telephone Encounter (Signed)
 Prescription refill request for Eliquis  received. Indication:afib Last office visit:4/25 Drm:wzzid labs Age:  Weight: Prescription refilled

## 2024-04-16 ENCOUNTER — Other Ambulatory Visit: Payer: Self-pay | Admitting: Cardiology

## 2024-04-19 MED ORDER — APIXABAN 5 MG PO TABS: 5.0000 mg | ORAL_TABLET | Freq: Two times a day (BID) | ORAL | 0 refills | Status: DC

## 2024-04-19 NOTE — Telephone Encounter (Signed)
 Pt has upcoming appt scheduled with Omar Qua, NP on 06/18/24, note placed on appt to draw CBC and BMP at [facility], NO PCP. Will refill Eliquis  to get pt to upcoming appt age 33, weight 129.7kg, based on specified criteria pt is on appropriate dosage of Eliquis  5mg  BID for afib.  Will refill rx.

## 2024-04-19 NOTE — Telephone Encounter (Signed)
 Pt last saw Dr Alvan 09/11/23, pt is overdue for 6 month follow-up with Dr Alvan, recall in Epic and needs follow-up labwork as well.  Last labs Creat 0.67 on 03/25/22, way overdue for labwork, orders in Epic. Msg sent to schedulers to contact pt for appt and labwork.  Will await appt for refill.

## 2024-06-09 ENCOUNTER — Other Ambulatory Visit: Payer: Self-pay

## 2024-06-09 MED ORDER — APIXABAN 5 MG PO TABS: 5.0000 mg | ORAL_TABLET | Freq: Two times a day (BID) | ORAL | Status: DC

## 2024-06-14 ENCOUNTER — Other Ambulatory Visit (HOSPITAL_COMMUNITY): Payer: Self-pay

## 2024-06-14 ENCOUNTER — Telehealth: Payer: Self-pay | Admitting: Pharmacy Technician

## 2024-06-14 NOTE — Telephone Encounter (Signed)
 Form faxed back to [employer], CPhT

## 2024-06-14 NOTE — Telephone Encounter (Signed)
 Received patient assistance application- just need provider portion

## 2024-06-14 NOTE — Telephone Encounter (Signed)
 Provider portion printed for Dr. Alvan to sign.

## 2024-06-14 NOTE — Telephone Encounter (Signed)
 Provider form faxed

## 2024-06-16 NOTE — Telephone Encounter (Signed)
 Omar Mccarthy

## 2024-06-17 ENCOUNTER — Ambulatory Visit: Payer: Self-pay | Admitting: Cardiology

## 2024-06-17 LAB — CBC WITH DIFFERENTIAL/PLATELET
Basophils Absolute: 0 x10E3/uL (ref 0.0–0.2)
Basos: 1 %
EOS (ABSOLUTE): 0.1 x10E3/uL (ref 0.0–0.4)
Eos: 2 %
Hematocrit: 44.3 % (ref 37.5–51.0)
Hemoglobin: 14.8 g/dL (ref 13.0–17.7)
Immature Grans (Abs): 0 x10E3/uL (ref 0.0–0.1)
Immature Granulocytes: 0 %
Lymphocytes Absolute: 2.1 x10E3/uL (ref 0.7–3.1)
Lymphs: 26 %
MCH: 31.6 pg (ref 26.6–33.0)
MCHC: 33.4 g/dL (ref 31.5–35.7)
MCV: 95 fL (ref 79–97)
Monocytes Absolute: 0.6 x10E3/uL (ref 0.1–0.9)
Monocytes: 8 %
Neutrophils Absolute: 5.3 x10E3/uL (ref 1.4–7.0)
Neutrophils: 63 %
Platelets: 169 x10E3/uL (ref 150–450)
RBC: 4.69 x10E6/uL (ref 4.14–5.80)
RDW: 12.8 % (ref 11.6–15.4)
WBC: 8.2 x10E3/uL (ref 3.4–10.8)

## 2024-06-17 LAB — BASIC METABOLIC PANEL WITH GFR
BUN/Creatinine Ratio: 21 — AB (ref 9–20)
BUN: 17 mg/dL (ref 6–20)
CO2: 22 mmol/L (ref 20–29)
Calcium: 9.4 mg/dL (ref 8.7–10.2)
Chloride: 102 mmol/L (ref 96–106)
Creatinine, Ser: 0.8 mg/dL (ref 0.76–1.27)
Glucose: 84 mg/dL (ref 70–99)
Potassium: 4.6 mmol/L (ref 3.5–5.2)
Sodium: 140 mmol/L (ref 134–144)
eGFR: 119 mL/min/1.73

## 2024-06-17 NOTE — Telephone Encounter (Signed)
 Sent patient portion a second time

## 2024-06-18 ENCOUNTER — Other Ambulatory Visit: Payer: Self-pay | Admitting: *Deleted

## 2024-06-18 ENCOUNTER — Encounter: Payer: Self-pay | Admitting: Student

## 2024-06-18 ENCOUNTER — Ambulatory Visit: Payer: MEDICAID | Attending: Student | Admitting: Student

## 2024-06-18 VITALS — BP 118/70 | HR 85 | Ht 68.0 in | Wt 255.4 lb

## 2024-06-18 DIAGNOSIS — I421 Obstructive hypertrophic cardiomyopathy: Secondary | ICD-10-CM

## 2024-06-18 DIAGNOSIS — Z7901 Long term (current) use of anticoagulants: Secondary | ICD-10-CM

## 2024-06-18 DIAGNOSIS — I480 Paroxysmal atrial fibrillation: Secondary | ICD-10-CM

## 2024-06-18 DIAGNOSIS — Z862 Personal history of diseases of the blood and blood-forming organs and certain disorders involving the immune mechanism: Secondary | ICD-10-CM

## 2024-06-18 MED ORDER — APIXABAN 5 MG PO TABS: 5.0000 mg | ORAL_TABLET | Freq: Two times a day (BID) | ORAL | 0 refills | Status: AC

## 2024-06-18 MED ORDER — METOPROLOL TARTRATE 50 MG PO TABS: 75.0000 mg | ORAL_TABLET | Freq: Two times a day (BID) | ORAL | 3 refills | Status: AC

## 2024-06-18 MED ORDER — DILTIAZEM HCL ER COATED BEADS 240 MG PO CP24: 240.0000 mg | ORAL_CAPSULE | Freq: Every day | ORAL | 3 refills | Status: AC

## 2024-06-18 NOTE — Patient Instructions (Signed)
 Medication Instructions:   Increase hydration prior to exercising. No medication changes at this time.   *If you need a refill on your cardiac medications before your next appointment, please call your pharmacy*   Follow-Up: At [facility], you and your health needs are our priority.  As part of our continuing mission to provide you with exceptional heart care, our providers are all part of one team.  This team includes your primary Cardiologist (physician) and Advanced Practice Providers or APPs (Physician Assistants and Nurse Practitioners) who all work together to provide you with the care you need, when you need it.  Your next appointment:   6 month(s)  Provider:   You may see Alvan Carrier, MD or one of the following Advanced Practice Providers on your designated Care Team:   Laymon Qua, PA-C  [place], [place] Olivia [place], [place]     We recommend signing up for the patient portal called MyChart.  Sign up information is provided on this After Visit Summary.  MyChart is used to connect with patients for Virtual Visits (Telemedicine).  Patients are able to view lab/test results, encounter notes, upcoming appointments, etc.  Non-urgent messages can be sent to your provider as well.   To learn more about what you can do with MyChart, go to [email or web address].   Other Instructions

## 2024-06-18 NOTE — Progress Notes (Signed)
 "  Cardiology Office Note    Date:  06/18/2024  ID:  Omar Mccarthy, DOB 10-26-1990, MRN [id number] Cardiologist: Alvan Carrier, MD { :  History of Present Illness:    Omar Mccarthy is a 34 y.o. male  with past medical history of persistent atrial fibrillation (diagnosed in 12/2019 with successful DCCV in 01/2020, recurrence in 09/2021 and s/p DCCV in 11/2021 and recurrence in 02/2022 and rate-control pursued at that time), chronic HFimpEF (EF 40% in 12/2019 in the setting of atrial fibrillation with RVR, normalized to 70-75% in 03/2021), HOCM, HTN and history of alcohol abuse who presents to the office today for overdue 52-month follow-up.  He was last examined by Dr. Alvan in 08/2023 and recent cardiac MRI had been suggestive of hypertrophic cardiomyopathy with possible obstruction but he denied any recent symptoms. Recent monitor also showed 100% atrial fibrillation burden and he denied any palpitations. Rate-control was recommended given his recurrent atrial fibrillation in the setting of alcohol intoxication and given that he did have HOCM, it was recommended to continue anticoagulation regardless of his CHA2DS2-VASc score. He did not have any detected risk factors at that time to indicate ICD placement and he was referred for genetic testing. The importance of adequate hydration was reviewed and he was continued on Eliquis  5 mg twice daily, Cardizem  CD 240 mg daily and Lopressor  75 mg twice daily.  He did meet with Dr. Thera for genetic testing in 02/2024 but given he did not have insurance coverage for testing at that time, he wished to wait until he obtained private insurance through his workplace.  In talking with the patient today, he reports he has overall been doing well since his last visit. 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 He does report occasional dizziness  with exercise and but reports symptoms spontaneously resolve within a few seconds and he is able to continue exercising. He says that he does not typically drink fluids prior to exercise but does this afterwards. No specific palpitations with his atrial fibrillation. No recent exertional chest pain, orthopnea, PND or pitting edema. Remains on Eliquis  for anticoagulation with no reports of active bleeding. He has not consumed alcohol since Halloween 2023.  Studies Reviewed:   EKG: EKG is not ordered today.   cMRI: 12/2022 IMPRESSION: Suggestive of hypertrophic cardiomyopathy with possible obstruction. Septal thickness 19 mm, with LGE burden < 10%. No apical aneurysm.   Mild to moderate mitral regurgitation.  Event Monitor: 01/2023   24 hour monitor   100% afib burden rates 55-222, avg HR 94   Rare ventricular ectopy in the form of isolated PVCs. No NSVT or VT   No symptoms reported     Patch Wear Time:  1 days and 1 hours (2024-09-12T16:17:35-0400 to 2024-09-13T18:03:07-398)   Atrial Fibrillation occurred continuously (100% burden), ranging from 55-222 bpm (avg of 94 bpm). Isolated VEs were rare (<1.0%), and no VE Couplets or VE Triplets were present. MD notification criteria for Rapid Atrial Fibrillation met - report posted  prior to notification per account request (PK).  Risk Assessment/Calculations:    CHA2DS2-VASc Score = 2  This indicates a 2.2% annual risk of stroke. The patient's score is based upon: CHF History: 1 HTN History: 1 Diabetes History: 0 Stroke History: 0 Vascular Disease History: 0 Age Score: 0 Gender Score: 0   Physical Exam:   VS:  BP  118/70 (BP Location: Left Arm, Cuff Size: Large)   Pulse 85   Ht 5' 8 (1.727 m)   Wt 255 lb 6.4 oz (115.8 kg)   SpO2 98%   BMI 38.83 kg/m    Wt Readings from Last 3 Encounters:  06/18/24 255 lb 6.4 oz (115.8 kg)  10/15/23 286 lb (129.7 kg)  09/11/23 287 lb 9.6 oz (130.5 kg)     GEN: Pleasant male appearing in  no acute distress NECK: No JVD; No carotid bruits CARDIAC: Irregularly irregular, no murmurs, rubs, gallops RESPIRATORY:  Clear to auscultation without rales, wheezing or rhonchi  ABDOMEN: Appears non-distended. No obvious abdominal masses. EXTREMITIES: No clubbing or cyanosis. No pitting edema.  Distal pedal pulses are 2+ bilaterally. Varicose veins noted.    Assessment and Plan:   1. Paroxysmal atrial fibrillation (HCC) - He denies any palpitations and heart rate is well-controlled in the 80's during today's visit. Continue Cardizem  CD 240 mg daily and Lopressor  75 mg twice daily. He has been losing weight in the setting of dietary changes and exercise. Encouraged him to follow BP at home as doses may need to be reduced over time.  2. Current use of long term anticoagulation - Continue Eliquis  5 mg twice daily for anticoagulation which is the correct dose given his current age (34 yo), weight (255 lbs) and renal function (0.80 when checked earlier this month). CBC showed his hemoglobin was stable at 14.8 with platelets at 169 K.   3. Obstructive hypertrophic cardiomyopathy (HCC) - Noted on prior cMRI and not felt to require ICD placement given no risk factors at that time. He does report occasional dizziness with exercise and reviewed the importance of adequate hydration prior to exercise. I encouraged him to make us  aware if symptoms persist as we could arrange for a follow-up ZIO monitor. - He is planning to undergo genetic testing once he has insurance through his work later this year. His daughter is meeting with a pediatric cardiologist in the next few weeks for screening.  4. History of thrombocytopenia - This previously occurred in the setting of alcohol use and he has now been sober for over 2 years. Platelet count was stable at 169 K when checked on 06/16/2024.   Signed, Laymon CHRISTELLA Qua, PA-C   "

## 2024-06-22 ENCOUNTER — Telehealth: Payer: Self-pay | Admitting: Student

## 2024-06-22 NOTE — Telephone Encounter (Signed)
 [facility] Health Financial Assistance application mailed to patient.

## 2024-06-22 NOTE — Telephone Encounter (Signed)
" °  The patient stated that during his last office visit, the front desk informed him that she would send him a financial assistance form to see his doctors. He is calling to follow up. "

## 2024-06-22 NOTE — Telephone Encounter (Signed)
" °  The patient stated that Omar Mccarthy contacted him to inform us  that they are faxing an additional request. There is a portion of the form that was not filled out, and they need it returned as soon as possible. "

## 2024-07-01 NOTE — Telephone Encounter (Signed)
 Patient states that [facility] Myers is saying that the forms wasn't filled out correct. Please advise
